# Patient Record
Sex: Female | Born: 1980 | Race: White | Hispanic: No | Marital: Married | Smoking: Never smoker
Health system: Southern US, Community
[De-identification: ages and names within clinical notes are randomized; demographics above are authoritative.]

## PROBLEM LIST (undated history)

## (undated) ENCOUNTER — Inpatient Hospital Stay (HOSPITAL_COMMUNITY): Payer: Self-pay

## (undated) DIAGNOSIS — F419 Anxiety disorder, unspecified: Secondary | ICD-10-CM

## (undated) DIAGNOSIS — R112 Nausea with vomiting, unspecified: Secondary | ICD-10-CM

## (undated) DIAGNOSIS — Z789 Other specified health status: Secondary | ICD-10-CM

## (undated) DIAGNOSIS — Z8632 Personal history of gestational diabetes: Secondary | ICD-10-CM

## (undated) DIAGNOSIS — Z8639 Personal history of other endocrine, nutritional and metabolic disease: Secondary | ICD-10-CM

## (undated) DIAGNOSIS — N200 Calculus of kidney: Secondary | ICD-10-CM

## (undated) DIAGNOSIS — T8859XA Other complications of anesthesia, initial encounter: Secondary | ICD-10-CM

## (undated) DIAGNOSIS — O24419 Gestational diabetes mellitus in pregnancy, unspecified control: Secondary | ICD-10-CM

## (undated) DIAGNOSIS — Z9889 Other specified postprocedural states: Secondary | ICD-10-CM

## (undated) DIAGNOSIS — C801 Malignant (primary) neoplasm, unspecified: Secondary | ICD-10-CM

## (undated) DIAGNOSIS — N189 Chronic kidney disease, unspecified: Secondary | ICD-10-CM

## (undated) DIAGNOSIS — Z87442 Personal history of urinary calculi: Secondary | ICD-10-CM

## (undated) DIAGNOSIS — T4145XA Adverse effect of unspecified anesthetic, initial encounter: Secondary | ICD-10-CM

## (undated) HISTORY — PX: NO PAST SURGERIES: SHX2092

## (undated) HISTORY — PX: WISDOM TOOTH EXTRACTION: SHX21

## (undated) HISTORY — PX: LITHOTRIPSY: SUR834

## (undated) MED FILL — Dexamethasone Sodium Phosphate Inj 100 MG/10ML: INTRAMUSCULAR | Qty: 1 | Status: AC

## (undated) MED FILL — Fosaprepitant Dimeglumine For IV Infusion 150 MG (Base Eq): INTRAVENOUS | Qty: 5 | Status: AC

---
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
## 2001-08-16 ENCOUNTER — Other Ambulatory Visit: Admission: RE | Admit: 2001-08-16 | Discharge: 2001-08-16 | Payer: Self-pay | Admitting: Gynecology

## 2002-08-23 ENCOUNTER — Other Ambulatory Visit: Admission: RE | Admit: 2002-08-23 | Discharge: 2002-08-23 | Payer: Self-pay | Admitting: Gynecology

## 2003-08-27 ENCOUNTER — Other Ambulatory Visit: Admission: RE | Admit: 2003-08-27 | Discharge: 2003-08-27 | Payer: Self-pay | Admitting: Gynecology

## 2004-06-30 ENCOUNTER — Other Ambulatory Visit: Admission: RE | Admit: 2004-06-30 | Discharge: 2004-06-30 | Payer: Self-pay | Admitting: Gynecology

## 2005-07-01 ENCOUNTER — Other Ambulatory Visit: Admission: RE | Admit: 2005-07-01 | Discharge: 2005-07-01 | Payer: Self-pay | Admitting: Gynecology

## 2008-10-25 ENCOUNTER — Encounter (INDEPENDENT_AMBULATORY_CARE_PROVIDER_SITE_OTHER): Payer: Self-pay | Admitting: Obstetrics and Gynecology

## 2008-10-25 ENCOUNTER — Ambulatory Visit: Admission: RE | Admit: 2008-10-25 | Discharge: 2008-10-25 | Payer: Self-pay | Admitting: Obstetrics and Gynecology

## 2008-10-25 ENCOUNTER — Ambulatory Visit: Payer: Self-pay | Admitting: Surgery

## 2009-06-19 ENCOUNTER — Emergency Department (HOSPITAL_COMMUNITY): Admission: EM | Admit: 2009-06-19 | Discharge: 2009-06-19 | Payer: Self-pay | Admitting: Emergency Medicine

## 2010-04-29 ENCOUNTER — Ambulatory Visit: Admission: RE | Admit: 2010-04-29 | Discharge: 2010-04-29 | Payer: Self-pay | Admitting: Obstetrics and Gynecology

## 2010-05-01 DIAGNOSIS — R0989 Other specified symptoms and signs involving the circulatory and respiratory systems: Secondary | ICD-10-CM | POA: Insufficient documentation

## 2010-05-21 ENCOUNTER — Ambulatory Visit: Payer: Self-pay | Admitting: Cardiology

## 2010-06-16 ENCOUNTER — Encounter (INDEPENDENT_AMBULATORY_CARE_PROVIDER_SITE_OTHER): Payer: Self-pay | Admitting: *Deleted

## 2010-09-09 ENCOUNTER — Ambulatory Visit: Payer: Self-pay | Admitting: Obstetrics and Gynecology

## 2010-09-09 ENCOUNTER — Inpatient Hospital Stay (HOSPITAL_COMMUNITY): Admission: AD | Admit: 2010-09-09 | Discharge: 2010-09-09 | Payer: Self-pay | Admitting: Obstetrics and Gynecology

## 2010-09-18 ENCOUNTER — Inpatient Hospital Stay (HOSPITAL_COMMUNITY): Admission: RE | Admit: 2010-09-18 | Discharge: 2010-09-21 | Payer: Self-pay | Admitting: Obstetrics and Gynecology

## 2011-01-26 NOTE — Assessment & Plan Note (Signed)
Summary: np6/17 weeks prgnant, tachycardia/jml   Visit Type:  new pt visit Referring Provider:  Dr. Henderson Cloud Primary Provider:  Laurann Montana  CC:  pt referred for tachycardia....sob....edema/hands....pt is [redacted] weeks gestation....denies any cp.  History of Present Illness: Sydney Rios is a 30 year old married white female, who is in her second trimester pregnancy, is referred by Dr.Tomblin for intermittent tachycardia or fast heartbeat.  She is enjoyed excellent cardiovascular health all her life. There is no history of any heart murmur or cardiac abnormality.  Prior to pregnancy and having these symptoms, she exercise on a regular basis. When she started having these episodes of tachycardia, she stopped.  She has a digital blood pressure cuff and has read her heart rate 140 on several occasions. 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 Has not occurred at rest. She has not fainted or become presyncopal. She denies orthostatic symptoms.  The pattern is a gradual onset and it doesn't suddenly stopped. He feels regular not irregular.  She admits to not drinking enough fluids. She did lemonade coffee which she drank fairly heavily prior to pregnancy.  This is her first pregnancy.  She does not use any alcohol, tobacco, or drugs. She is on only a prenatal vitamin.  Blood work May 4 showed normal electrolytes, normal CBC except for a mild anemia with a hemoglobin of 11.8.  Preventive Screening-Counseling & Management  Alcohol-Tobacco     Smoking Status: never  Caffeine-Diet-Exercise     Does Patient Exercise: no      Drug Use:  no.    Current Medications (verified): 1)  Prenatal Vitamins 0.8 Mg Tabs (Prenatal Multivit-Min-Fe-Fa) .Marland Kitchen.. 1 Tab Once Daily  Allergies (verified): No Known Drug Allergies  Past History:  Past Surgical History: wisdom teeth removed at 30 yrs old.. No other surgeries to report as per pt  Family  History: Both Grandfathers have had MI's Family History of Hypertension:  Grandmother has hyperlipidemia Family History of Diabetes: Mother type II  Social History: Full Time Married  Tobacco Use - No.  Alcohol Use - no Regular Exercise - no Drug Use - no Smoking Status:  never Does Patient Exercise:  no Drug Use:  no  Review of Systems       negative history of present illness  Vital Signs:  Patient profile:   30 year old female Height:      67 inches Weight:      166 pounds BMI:     26.09 Pulse rate:   87 / minute Pulse rhythm:   regular BP sitting:   110 / 62  (left arm) Cuff size:   large  Vitals Entered By: Sydney Rios, CMA (May 21, 2010 4:19 PM)  Physical Exam  General:  Well developed, well nourished, in no acute distress. Head:  normocephalic and atraumatic Eyes:  PERRLA/EOM intact; conjunctiva and lids normal. Neck:  thyroid is palpable, not enlarged or asymmetric, nontender Chest Deniz Eskridge:  no deformities or breast masses noted Lungs:  Clear bilaterally to auscultation and percussion. Heart:  Non-displaced PMI, chest non-tender; regular rate and rhythm, S1, S2 without murmurs, rubs or gallops. Carotid upstroke normal, no bruit. Normal abdominal aortic size, no bruits. Femorals normal pulses, no bruits. Pedals normal pulses. No edema, no varicosities. Abdomen:  pregnant, good bowelsounds Msk:  Back normal, normal gait. Muscle strength and tone normal. Pulses:  pulses normal in all 4 extremities Extremities:  No clubbing or cyanosis. Neurologic:  Alert and  oriented x 3. Skin:  Intact without lesions or rashes. Psych:  Normal affect.   EKG  Procedure date:  05/21/2010  Findings:      normal sinus rhythm, normal EKG except for RSR prime in V1 and V2 QRS 90 ms  Impression & Recommendations:  Problem # 1:  TACHYCARDIA (ICD-785) Assessment New Her symptoms do not sound like a pathologic arrhythmia. She has a fairly benign history, is extremely healthy  otherwise, all exam, and this is a normal EKG except for RSR prime in V1 and 2 consistent with an incomplete right bundle branch block. I will check a TSH and echo to rule out structural heart disease. I've asked her stay well hydrated and to exercise on regular basis which may improve her vagal time and decrease her chance of varying heart rates. Orders: EKG w/ Interpretation (93000) Echocardiogram (Echo)  Patient Instructions: 1)  Your physician recommends that you schedule a follow-up appointment in: AS NEEDED 2)  Your physician recommends that you continue on your current medications as directed. Please refer to the Current Medication list given to you today. 3)  Your physician recommends that you return for lab work in:TSH 785.1 DAY OF ECHO 4)  Your physician has requested that you have an echocardiogram.  Echocardiography is a painless test that uses sound waves to create images of your heart. It provides your doctor with information about the size and shape of your heart and how well your heart's chambers and valves are working.  This procedure takes approximately one hour. There are no restrictions for this procedure. 5)  INCREASE FLIUD INTAKE

## 2011-01-26 NOTE — Letter (Signed)
Summary: Appointment - Missed  [facility], Main Office  [address]   [place], [place] [address]   Phone: [phone]  Fax: [phone]     June 16, 2010 MRN: [id number]   JESIAH YERBY [address] [place], [place]  [address]   Dear Ms. Rollen Sox,  Our records indicate you missed your appointment on   06/15/2010 for Labs also Echo .  It is very important that we reach you to reschedule this appointment. We look forward to participating in your health care needs. Please contact us at the number listed above at your earliest convenience to reschedule this appointment.     Sincerely,    Glass blower/designer

## 2011-03-11 LAB — CBC
HCT: 26.7 % — ABNORMAL LOW (ref 36.0–46.0)
HCT: 33.8 % — ABNORMAL LOW (ref 36.0–46.0)
Hemoglobin: 11.4 g/dL — ABNORMAL LOW (ref 12.0–15.0)
Hemoglobin: 9.2 g/dL — ABNORMAL LOW (ref 12.0–15.0)
MCH: 30.4 pg (ref 26.0–34.0)
MCH: 31.2 pg (ref 26.0–34.0)
MCHC: 33.8 g/dL (ref 30.0–36.0)
MCHC: 34.5 g/dL (ref 30.0–36.0)
MCV: 90 fL (ref 78.0–100.0)
MCV: 90.4 fL (ref 78.0–100.0)
Platelets: 135 10*3/uL — ABNORMAL LOW (ref 150–400)
Platelets: 164 10*3/uL (ref 150–400)
RBC: 2.95 MIL/uL — ABNORMAL LOW (ref 3.87–5.11)
RBC: 3.75 MIL/uL — ABNORMAL LOW (ref 3.87–5.11)
RDW: 14.1 % (ref 11.5–15.5)
RDW: 14.4 % (ref 11.5–15.5)
WBC: 6.7 10*3/uL (ref 4.0–10.5)
WBC: 9.9 10*3/uL (ref 4.0–10.5)

## 2011-03-11 LAB — SURGICAL PCR SCREEN
MRSA, PCR: NEGATIVE
Staphylococcus aureus: NEGATIVE

## 2011-03-11 LAB — RPR: RPR Ser Ql: NONREACTIVE

## 2011-03-16 LAB — CBC
HCT: 34 % — ABNORMAL LOW (ref 36.0–46.0)
Hemoglobin: 11.8 g/dL — ABNORMAL LOW (ref 12.0–15.0)
MCV: 91.8 fL (ref 78.0–100.0)
RBC: 3.7 MIL/uL — ABNORMAL LOW (ref 3.87–5.11)
WBC: 7.3 10*3/uL (ref 4.0–10.5)

## 2011-03-16 LAB — COMPREHENSIVE METABOLIC PANEL
AST: 32 U/L (ref 0–37)
BUN: 9 mg/dL (ref 6–23)
CO2: 26 mEq/L (ref 19–32)
Chloride: 104 mEq/L (ref 96–112)
Creatinine, Ser: 0.46 mg/dL (ref 0.4–1.2)
GFR calc Af Amer: >60 mL/min (ref >60)
GFR calc non Af Amer: >60 mL/min (ref >60)
Glucose, Bld: 86 mg/dL (ref 70–99)
Total Bilirubin: 0.1 mg/dL — ABNORMAL LOW (ref 0.3–1.2)

## 2013-01-23 ENCOUNTER — Encounter (HOSPITAL_COMMUNITY)
Admission: AD | Disposition: A | Discharge status: Home / Self Care | Payer: Self-pay | Source: Ambulatory Visit | Attending: Obstetrics and Gynecology

## 2013-01-23 ENCOUNTER — Inpatient Hospital Stay (HOSPITAL_COMMUNITY): Payer: BC Managed Care – PPO | Admitting: Anesthesiology

## 2013-01-23 ENCOUNTER — Inpatient Hospital Stay (HOSPITAL_COMMUNITY)
Admission: AD | Admit: 2013-01-23 | Discharge: 2013-01-25 | DRG: 371 | Disposition: A | Discharge status: Home / Self Care | Payer: BC Managed Care – PPO | Source: Ambulatory Visit | Attending: Obstetrics and Gynecology | Admitting: Obstetrics and Gynecology

## 2013-01-23 ENCOUNTER — Encounter (HOSPITAL_COMMUNITY): Payer: Self-pay | Admitting: *Deleted

## 2013-01-23 ENCOUNTER — Encounter (HOSPITAL_COMMUNITY): Payer: Self-pay | Admitting: Anesthesiology

## 2013-01-23 DIAGNOSIS — O34219 Maternal care for unspecified type scar from previous cesarean delivery: Principal | ICD-10-CM | POA: Diagnosis present

## 2013-01-23 HISTORY — DX: Other specified postprocedural states: Z98.890

## 2013-01-23 HISTORY — DX: Other specified health status: Z78.9

## 2013-01-23 HISTORY — DX: Nausea with vomiting, unspecified: R11.2

## 2013-01-23 HISTORY — DX: Other specified postprocedural states: R11.2

## 2013-01-23 LAB — RPR: RPR Ser Ql: NONREACTIVE

## 2013-01-23 LAB — CBC
MCH: 26.7 pg (ref 26.0–34.0)
MCH: 27 pg (ref 26.0–34.0)
MCHC: 32.4 g/dL (ref 30.0–36.0)
Platelets: 212 10*3/uL (ref 150–400)
RBC: 4.23 MIL/uL (ref 3.87–5.11)
RDW: 14.1 % (ref 11.5–15.5)

## 2013-01-23 LAB — TYPE AND SCREEN

## 2013-01-23 LAB — POCT FERN TEST: POCT Fern Test: POSITIVE

## 2013-01-23 SURGERY — Surgical Case
Anesthesia: Spinal | Wound class: Clean Contaminated

## 2013-01-23 MED ORDER — PROMETHAZINE HCL 25 MG/ML IJ SOLN
INTRAMUSCULAR | Status: AC
  Filled 2013-01-23: qty 1

## 2013-01-23 MED ORDER — OXYTOCIN 10 UNIT/ML IJ SOLN
40.0000 [IU] | INTRAVENOUS | Status: DC | PRN
  Administered 2013-01-23: 40 [IU] via INTRAVENOUS

## 2013-01-23 MED ORDER — ZOLPIDEM TARTRATE 5 MG PO TABS: 5.0000 mg | ORAL_TABLET | Freq: Every evening | ORAL | Status: DC | PRN

## 2013-01-23 MED ORDER — NALBUPHINE HCL 10 MG/ML IJ SOLN
5.0000 mg | INTRAMUSCULAR | Status: DC | PRN
Start: 2013-01-23 — End: 2013-01-25
  Filled 2013-01-23: qty 1

## 2013-01-23 MED ORDER — KETOROLAC TROMETHAMINE 30 MG/ML IJ SOLN: 30.0000 mg | Freq: Four times a day (QID) | INTRAMUSCULAR | Status: AC | PRN

## 2013-01-23 MED ORDER — ONDANSETRON HCL 4 MG PO TABS: 4.0000 mg | ORAL_TABLET | ORAL | Status: DC | PRN

## 2013-01-23 MED ORDER — OXYCODONE-ACETAMINOPHEN 5-325 MG PO TABS
1.0000 | ORAL_TABLET | ORAL | Status: DC | PRN
  Administered 2013-01-23 – 2013-01-25 (×9): 1 via ORAL
  Filled 2013-01-23 (×9): qty 1

## 2013-01-23 MED ORDER — DEXTROSE 5 % IV SOLN
2.0000 g | Freq: Once | INTRAVENOUS | Status: AC
  Administered 2013-01-23: 2 g via INTRAVENOUS
  Filled 2013-01-23: qty 2

## 2013-01-23 MED ORDER — LACTATED RINGERS IV SOLN
INTRAVENOUS | Status: DC | PRN
  Administered 2013-01-23 (×4): via INTRAVENOUS

## 2013-01-23 MED ORDER — DIPHENHYDRAMINE HCL 50 MG/ML IJ SOLN: 12.5000 mg | INTRAMUSCULAR | Status: DC | PRN

## 2013-01-23 MED ORDER — OXYTOCIN 10 UNIT/ML IJ SOLN
INTRAMUSCULAR | Status: AC
  Filled 2013-01-23: qty 4

## 2013-01-23 MED ORDER — SIMETHICONE 80 MG PO CHEW
80.0000 mg | CHEWABLE_TABLET | Freq: Three times a day (TID) | ORAL | Status: DC
  Administered 2013-01-23 – 2013-01-25 (×6): 80 mg via ORAL

## 2013-01-23 MED ORDER — NALOXONE HCL 0.4 MG/ML IJ SOLN: 0.4000 mg | INTRAMUSCULAR | Status: DC | PRN

## 2013-01-23 MED ORDER — SIMETHICONE 80 MG PO CHEW: 80.0000 mg | CHEWABLE_TABLET | ORAL | Status: DC | PRN

## 2013-01-23 MED ORDER — LACTATED RINGERS IV SOLN
INTRAVENOUS | Status: DC
  Administered 2013-01-23: 125 mL/h via INTRAVENOUS

## 2013-01-23 MED ORDER — LACTATED RINGERS IV SOLN
INTRAVENOUS | Status: DC | PRN
  Administered 2013-01-23: 07:00:00 via INTRAVENOUS

## 2013-01-23 MED ORDER — METOCLOPRAMIDE HCL 5 MG/ML IJ SOLN
10.0000 mg | Freq: Once | INTRAMUSCULAR | Status: AC
  Administered 2013-01-23: 10 mg via INTRAVENOUS

## 2013-01-23 MED ORDER — SCOPOLAMINE 1 MG/3DAYS TD PT72
MEDICATED_PATCH | TRANSDERMAL | Status: AC
  Administered 2013-01-23: 1 via TRANSDERMAL
  Filled 2013-01-23: qty 1

## 2013-01-23 MED ORDER — TETANUS-DIPHTH-ACELL PERTUSSIS 5-2.5-18.5 LF-MCG/0.5 IM SUSP
0.5000 mL | Freq: Once | INTRAMUSCULAR | Status: DC
Start: 2013-01-24 — End: 2013-01-25

## 2013-01-23 MED ORDER — PRENATAL MULTIVITAMIN CH: 1.0000 | ORAL_TABLET | Freq: Every day | ORAL | Status: DC

## 2013-01-23 MED ORDER — PHENYLEPHRINE 40 MCG/ML (10ML) SYRINGE FOR IV PUSH (FOR BLOOD PRESSURE SUPPORT)
PREFILLED_SYRINGE | INTRAVENOUS | Status: AC
  Filled 2013-01-23: qty 5

## 2013-01-23 MED ORDER — SCOPOLAMINE 1 MG/3DAYS TD PT72: 1.0000 | MEDICATED_PATCH | Freq: Once | TRANSDERMAL | Status: DC

## 2013-01-23 MED ORDER — FENTANYL CITRATE 0.05 MG/ML IJ SOLN
INTRAMUSCULAR | Status: AC
  Filled 2013-01-23: qty 2

## 2013-01-23 MED ORDER — MORPHINE SULFATE (PF) 0.5 MG/ML IJ SOLN
INTRAMUSCULAR | Status: DC | PRN
  Administered 2013-01-23: .1 mg via INTRATHECAL

## 2013-01-23 MED ORDER — LANOLIN HYDROUS EX OINT: 1.0000 "application " | TOPICAL_OINTMENT | CUTANEOUS | Status: DC | PRN

## 2013-01-23 MED ORDER — FAMOTIDINE IN NACL 20-0.9 MG/50ML-% IV SOLN
20.0000 mg | Freq: Once | INTRAVENOUS | Status: AC
  Administered 2013-01-23: 20 mg via INTRAVENOUS
  Filled 2013-01-23: qty 50

## 2013-01-23 MED ORDER — MENTHOL 3 MG MT LOZG: 1.0000 | LOZENGE | OROMUCOSAL | Status: DC | PRN

## 2013-01-23 MED ORDER — FENTANYL CITRATE 0.05 MG/ML IJ SOLN
INTRAMUSCULAR | Status: DC | PRN
  Administered 2013-01-23: 15 ug via INTRATHECAL

## 2013-01-23 MED ORDER — IBUPROFEN 600 MG PO TABS
600.0000 mg | ORAL_TABLET | Freq: Four times a day (QID) | ORAL | Status: DC
  Administered 2013-01-23 – 2013-01-25 (×8): 600 mg via ORAL
  Filled 2013-01-23 (×8): qty 1

## 2013-01-23 MED ORDER — MORPHINE SULFATE 0.5 MG/ML IJ SOLN
INTRAMUSCULAR | Status: AC
  Filled 2013-01-23: qty 10

## 2013-01-23 MED ORDER — BUPIVACAINE IN DEXTROSE 0.75-8.25 % IT SOLN
INTRATHECAL | Status: DC | PRN
  Administered 2013-01-23: 1.5 mL via INTRATHECAL

## 2013-01-23 MED ORDER — PROMETHAZINE HCL 25 MG/ML IJ SOLN: 6.2500 mg | INTRAMUSCULAR | Status: DC | PRN

## 2013-01-23 MED ORDER — PRENATAL MULTIVITAMIN CH
1.0000 | ORAL_TABLET | Freq: Every day | ORAL | Status: DC
  Administered 2013-01-24 – 2013-01-25 (×2): 1 via ORAL
  Filled 2013-01-23 (×3): qty 1

## 2013-01-23 MED ORDER — NALOXONE HCL 1 MG/ML IJ SOLN: 1.0000 ug/kg/h | INTRAVENOUS | Status: DC | PRN

## 2013-01-23 MED ORDER — CITRIC ACID-SODIUM CITRATE 334-500 MG/5ML PO SOLN
30.0000 mL | Freq: Once | ORAL | Status: AC
  Administered 2013-01-23: 30 mL via ORAL
  Filled 2013-01-23: qty 15

## 2013-01-23 MED ORDER — DIPHENHYDRAMINE HCL 50 MG/ML IJ SOLN: 25.0000 mg | INTRAMUSCULAR | Status: DC | PRN

## 2013-01-23 MED ORDER — METOCLOPRAMIDE HCL 5 MG/ML IJ SOLN
INTRAMUSCULAR | Status: AC
  Administered 2013-01-23: 10 mg via INTRAVENOUS
  Filled 2013-01-23: qty 2

## 2013-01-23 MED ORDER — KETOROLAC TROMETHAMINE 30 MG/ML IJ SOLN
INTRAMUSCULAR | Status: AC
  Administered 2013-01-23: 30 mg
  Filled 2013-01-23: qty 1

## 2013-01-23 MED ORDER — DIPHENHYDRAMINE HCL 25 MG PO CAPS
25.0000 mg | ORAL_CAPSULE | Freq: Four times a day (QID) | ORAL | Status: DC | PRN
  Administered 2013-01-23: 25 mg via ORAL
  Filled 2013-01-23: qty 1

## 2013-01-23 MED ORDER — ONDANSETRON HCL 4 MG/2ML IJ SOLN
4.0000 mg | Freq: Three times a day (TID) | INTRAMUSCULAR | Status: DC | PRN
  Filled 2013-01-23: qty 2

## 2013-01-23 MED ORDER — PHENYLEPHRINE HCL 10 MG/ML IJ SOLN
INTRAMUSCULAR | Status: DC | PRN
  Administered 2013-01-23: 80 ug via INTRAVENOUS
  Administered 2013-01-23: 40 ug via INTRAVENOUS

## 2013-01-23 MED ORDER — KETOROLAC TROMETHAMINE 60 MG/2ML IM SOLN: 60.0000 mg | Freq: Once | INTRAMUSCULAR | Status: DC | PRN

## 2013-01-23 MED ORDER — IBUPROFEN 600 MG PO TABS: 600.0000 mg | ORAL_TABLET | Freq: Four times a day (QID) | ORAL | Status: DC | PRN

## 2013-01-23 MED ORDER — LACTATED RINGERS IV SOLN
INTRAVENOUS | Status: DC
  Administered 2013-01-23: 16:00:00 via INTRAVENOUS

## 2013-01-23 MED ORDER — DIBUCAINE 1 % RE OINT: 1.0000 "application " | TOPICAL_OINTMENT | RECTAL | Status: DC | PRN

## 2013-01-23 MED ORDER — ONDANSETRON HCL 4 MG/2ML IJ SOLN
INTRAMUSCULAR | Status: AC
  Filled 2013-01-23: qty 2

## 2013-01-23 MED ORDER — FENTANYL CITRATE 0.05 MG/ML IJ SOLN: 25.0000 ug | INTRAMUSCULAR | Status: DC | PRN

## 2013-01-23 MED ORDER — OXYTOCIN 40 UNITS IN LACTATED RINGERS INFUSION - SIMPLE MED: 62.5000 mL/h | INTRAVENOUS | Status: AC

## 2013-01-23 MED ORDER — MEPERIDINE HCL 25 MG/ML IJ SOLN: 6.2500 mg | INTRAMUSCULAR | Status: DC | PRN

## 2013-01-23 MED ORDER — ONDANSETRON HCL 4 MG/2ML IJ SOLN
INTRAMUSCULAR | Status: DC | PRN
  Administered 2013-01-23: 4 mg via INTRAVENOUS

## 2013-01-23 MED ORDER — NALBUPHINE HCL 10 MG/ML IJ SOLN
5.0000 mg | INTRAMUSCULAR | Status: DC | PRN
  Filled 2013-01-23: qty 1

## 2013-01-23 MED ORDER — METOCLOPRAMIDE HCL 5 MG/ML IJ SOLN: 10.0000 mg | Freq: Three times a day (TID) | INTRAMUSCULAR | Status: DC | PRN

## 2013-01-23 MED ORDER — ONDANSETRON HCL 4 MG/2ML IJ SOLN
4.0000 mg | INTRAMUSCULAR | Status: DC | PRN
  Administered 2013-01-23: 4 mg via INTRAVENOUS

## 2013-01-23 MED ORDER — DEXTROSE 5 % IV SOLN
2.0000 g | INTRAVENOUS | Status: DC | PRN
  Administered 2013-01-23: 2 g via INTRAVENOUS

## 2013-01-23 MED ORDER — SENNOSIDES-DOCUSATE SODIUM 8.6-50 MG PO TABS
2.0000 | ORAL_TABLET | Freq: Every day | ORAL | Status: DC
  Administered 2013-01-23 – 2013-01-24 (×2): 2 via ORAL

## 2013-01-23 MED ORDER — DIPHENHYDRAMINE HCL 25 MG PO CAPS
25.0000 mg | ORAL_CAPSULE | ORAL | Status: DC | PRN
  Administered 2013-01-24: 25 mg via ORAL
  Filled 2013-01-23: qty 1

## 2013-01-23 MED ORDER — WITCH HAZEL-GLYCERIN EX PADS: 1.0000 "application " | MEDICATED_PAD | CUTANEOUS | Status: DC | PRN

## 2013-01-23 MED ORDER — SODIUM CHLORIDE 0.9 % IJ SOLN: 3.0000 mL | INTRAMUSCULAR | Status: DC | PRN

## 2013-01-23 SURGICAL SUPPLY — 33 items
CLOTH BEACON ORANGE TIMEOUT ST (SAFETY) ×2 IMPLANT
DRAPE LG THREE QUARTER DISP (DRAPES) ×2 IMPLANT
DRESSING TELFA 8X3 (GAUZE/BANDAGES/DRESSINGS) IMPLANT
DRSG OPSITE POSTOP 4X10 (GAUZE/BANDAGES/DRESSINGS) ×2 IMPLANT
DURAPREP 26ML APPLICATOR (WOUND CARE) ×2 IMPLANT
ELECT REM PT RETURN 9FT ADLT (ELECTROSURGICAL) ×2
ELECTRODE REM PT RTRN 9FT ADLT (ELECTROSURGICAL) ×1 IMPLANT
EXTRACTOR VACUUM M CUP 4 TUBE (SUCTIONS) IMPLANT
GAUZE SPONGE 4X4 12PLY STRL LF (GAUZE/BANDAGES/DRESSINGS) IMPLANT
GLOVE BIO SURGEON STRL SZ8 (GLOVE) IMPLANT
GLOVE BIOGEL PI IND STRL 7.0 (GLOVE) ×1 IMPLANT
GLOVE BIOGEL PI INDICATOR 7.0 (GLOVE) ×1
GLOVE SURG ORTHO 8.0 STRL STRW (GLOVE) ×2 IMPLANT
GOWN PREVENTION PLUS LG XLONG (DISPOSABLE) ×4 IMPLANT
KIT ABG SYR 3ML LUER SLIP (SYRINGE) ×2 IMPLANT
NEEDLE HYPO 25X5/8 SAFETYGLIDE (NEEDLE) ×2 IMPLANT
NS IRRIG 1000ML POUR BTL (IV SOLUTION) ×2 IMPLANT
PACK C SECTION WH (CUSTOM PROCEDURE TRAY) IMPLANT
PAD ABD 7.5X8 STRL (GAUZE/BANDAGES/DRESSINGS) IMPLANT
PAD OB MATERNITY 4.3X12.25 (PERSONAL CARE ITEMS) ×2 IMPLANT
RTRCTR C-SECT PINK 25CM LRG (MISCELLANEOUS) ×2 IMPLANT
SLEEVE SCD COMPRESS KNEE MED (MISCELLANEOUS) ×2 IMPLANT
STAPLER VISISTAT 35W (STAPLE) ×2 IMPLANT
SUT MNCRL 0 VIOLET CTX 36 (SUTURE) ×3 IMPLANT
SUT MONOCRYL 0 CTX 36 (SUTURE) ×3
SUT PDS AB 1 CT  36 (SUTURE)
SUT PDS AB 1 CT 36 (SUTURE) IMPLANT
SUT VIC AB 1 CTX 36 (SUTURE)
SUT VIC AB 1 CTX36XBRD ANBCTRL (SUTURE) IMPLANT
TOWEL OR 17X24 6PK STRL BLUE (TOWEL DISPOSABLE) ×6 IMPLANT
TRAY FOLEY CATH 14FR (SET/KITS/TRAYS/PACK) IMPLANT
TRAY FOLEY CATH 16FR SILVER (SET/KITS/TRAYS/PACK) ×2 IMPLANT
WATER STERILE IRR 1000ML POUR (IV SOLUTION) ×2 IMPLANT

## 2013-01-23 NOTE — H&P (Signed)
Sydney Rios is a 32 y.o. female presenting for labor and SROM clear fluid at 0315 this am.  Previous C/S desires repeat.  Pregnancy has been uncomplicated. History OB History    Grav Para Term Preterm Abortions TAB SAB Ect Mult Living   2 1 1       1      Past Medical History  Diagnosis Date  . PONV (postoperative nausea and vomiting)   . No pertinent past medical history    Past Surgical History  Procedure Date  . Cesarean section   . No past surgeries    Family History: family history is not on file. Social History:  reports that she has never smoked. She has never used smokeless tobacco. She reports that she does not drink alcohol or use illicit drugs.   Prenatal Transfer Tool  Maternal Diabetes: No Genetic Screening: Normal Maternal Ultrasounds/Referrals: Normal Fetal Ultrasounds or other Referrals:  None Maternal Substance Abuse:  No Significant Maternal Medications:   Significant Maternal Lab Results:  None Other Comments:  None  ROS  Dilation: Closed Effacement (%): 60 Station: Ballotable Exam by:: M.Topp,RN Blood pressure 121/84, pulse 99, temperature 98.1 F (36.7 C), temperature source Oral, resp. rate 20, height 5\' 7"  (1.702 m), weight 88.451 kg (195 lb), SpO2 100.00%. Exam Physical Exam  Prenatal labs: ABO, Rh:   Antibody:   Rubella:   RPR:    HBsAg:    HIV:    GBS:     Assessment/Plan: IUP at term in labor with SROM Prev C/S desires repeat  Plan repeat LSTCS Risks and benefits of C/S were discussed.  All questions were answered and informed consent was obtained.  Plan to proceed with low segment transverse Cesarean Section. This patient has been seen and examined.   All of her questions were answered.  Labs and vital signs reviewed.  Informed consent has been obtained.  The History and Physical is current.    Jersey Ravenscroft C 01/23/2013, 6:00 AM

## 2013-01-23 NOTE — Anesthesia Postprocedure Evaluation (Deleted)
Anesthesia Post Note  Patient: Sydney Rios  Procedure(s) Performed: Procedure(s) (LRB): CESAREAN SECTION (N/A)  Anesthesia type: General  Patient location: PACU  Post pain: Pain level controlled  Post assessment: Post-op Vital signs reviewed  Last Vitals:  Filed Vitals:   01/23/13 0815  BP: 123/69  Pulse: 65  Temp:   Resp: 12    Post vital signs: Reviewed  Level of consciousness: sedated  Complications: No apparent anesthesia complicationsfj

## 2013-01-23 NOTE — MAU Note (Signed)
FHT 145-150 x 3 min post spinal placement

## 2013-01-23 NOTE — Anesthesia Postprocedure Evaluation (Signed)
  Anesthesia Post-op Note  Patient: Sydney Rios  Procedure(s) Performed: Procedure(s) (LRB) with comments: CESAREAN SECTION (N/A)  Patient Location: PACU and Mother/Baby  Anesthesia Type:Spinal  Level of Consciousness: awake, alert  and oriented  Airway and Oxygen Therapy: Patient Spontanous Breathing  Post-op Pain: mild  Post-op Assessment: Patient's Cardiovascular Status Stable, Respiratory Function Stable, No signs of Nausea or vomiting, Adequate PO intake, Pain level controlled, No headache, No backache, No residual numbness and No residual motor weakness  Post-op Vital Signs: stable  Complications: No apparent anesthesia complications

## 2013-01-23 NOTE — Addendum Note (Signed)
Addendum  created 01/23/13 1913 by Elbert Ewings, CRNA   Modules edited:Charges VN, Notes Section

## 2013-01-23 NOTE — Anesthesia Preprocedure Evaluation (Signed)
Anesthesia Evaluation  Patient identified by MRN, date of birth, ID band Patient awake    Reviewed: Allergy & Precautions, H&P , NPO status , Patient's Chart, lab work & pertinent test results, reviewed documented beta blocker date and time   History of Anesthesia Complications (+) PONV  Airway Mallampati: II TM Distance: >3 FB Neck ROM: full    Dental  (+) Teeth Intact   Pulmonary neg pulmonary ROS,  breath sounds clear to auscultation        Cardiovascular negative cardio ROS  Rhythm:regular Rate:Normal     Neuro/Psych negative neurological ROS  negative psych ROS   GI/Hepatic negative GI ROS, Neg liver ROS,   Endo/Other  negative endocrine ROS  Renal/GU negative Renal ROS  negative genitourinary   Musculoskeletal   Abdominal   Peds  Hematology negative hematology ROS (+)   Anesthesia Other Findings Last ate at 7 pm, sips of water at 4 am.   Ordered reglan, pepcid and bicitra prior to OR  Reproductive/Obstetrics (+) Pregnancy (h/o C/S x1, SROM)                           Anesthesia Physical Anesthesia Plan  ASA: II and emergent  Anesthesia Plan: Spinal   Post-op Pain Management:    Induction:   Airway Management Planned:   Additional Equipment:   Intra-op Plan:   Post-operative Plan:   Informed Consent: I have reviewed the patients History and Physical, chart, labs and discussed the procedure including the risks, benefits and alternatives for the proposed anesthesia with the patient or authorized representative who has indicated his/her understanding and acceptance.     Plan Discussed with: Surgeon and CRNA  Anesthesia Plan Comments:         Anesthesia Quick Evaluation

## 2013-01-23 NOTE — Anesthesia Postprocedure Evaluation (Deleted)
Anesthesia Post Note  Patient: Sydney Rios  Procedure(s) Performed: Procedure(s) (LRB): CESAREAN SECTION (N/A)  Anesthesia type: Spinal  Patient location: PACU  Post pain: Pain level controlled  Post assessment: Post-op Vital signs reviewed  Last Vitals:  Filed Vitals:   01/23/13 0815  BP: 123/69  Pulse: 65  Temp:   Resp: 12    Post vital signs: Reviewed  Level of consciousness: awake  Complications: No apparent anesthesia complications

## 2013-01-23 NOTE — MAU Note (Signed)
Pt states her water broke-note clear fluid running down her legs

## 2013-01-23 NOTE — Anesthesia Postprocedure Evaluation (Signed)
Anesthesia Post Note  Patient: Sydney Rios  Procedure(s) Performed: Procedure(s) (LRB): CESAREAN SECTION (N/A)  Anesthesia type: Spinal  Patient location: PACU  Post pain: Pain level controlled  Post assessment: Post-op Vital signs reviewed  Last Vitals:  Filed Vitals:   01/23/13 0830  BP: 101/60  Pulse: 71  Temp: 36.9 C  Resp: 12    Post vital signs: Reviewed  Level of consciousness: awake  Complications: No apparent anesthesia complications

## 2013-01-23 NOTE — Anesthesia Procedure Notes (Signed)
Spinal  Patient location during procedure: OR Start time: 01/23/2013 6:42 AM Staffing Performed by: anesthesiologist  Preanesthetic Checklist Completed: patient identified, site marked, surgical consent, pre-op evaluation, timeout performed, IV checked, risks and benefits discussed and monitors and equipment checked Spinal Block Patient position: sitting Prep: site prepped and draped and DuraPrep Patient monitoring: continuous pulse ox, blood pressure and heart rate Approach: midline Location: L3-4 Injection technique: single-shot Needle Needle type: Sprotte and Other  Needle gauge: 24 G Needle length: 9 cm Assessment Sensory level: T4 Additional Notes Clear free flow CSF on first attempt.  No paresthesia.  Patient tolerated procedure well.  Jasmine December, MD

## 2013-01-23 NOTE — Transfer of Care (Signed)
Immediate Anesthesia Transfer of Care Note  Patient: Sydney Rios  Procedure(s) Performed: Procedure(s) (LRB) with comments: CESAREAN SECTION (N/A)  Patient Location: PACU  Anesthesia Type:Spinal  Level of Consciousness: awake, alert  and oriented  Airway & Oxygen Therapy: Patient Spontanous Breathing  Post-op Assessment: Report given to PACU RN and Post -op Vital signs reviewed and stable  Post vital signs: Reviewed and stable  Complications: No apparent anesthesia complications

## 2013-01-23 NOTE — Op Note (Signed)
Cesarean Section Procedure Note  Pre-operative Diagnosis: IUP at 37 weeks, Prev C/S for repeat, Labor and SROM  Post-operative Diagnosis: same  Surgeon: Turner Daniels   Assistants: none  Anesthesia:spinal  Procedure:  Low Segment Transverse cesarean section  Procedure Details  The patient was seen in the Holding Room. The risks, benefits, complications, treatment options, and expected outcomes were discussed with the patient.  The patient concurred with the proposed plan, giving informed consent.  The site of surgery properly noted/marked.. A Time Out was held and the above information confirmed.  After induction of anesthesia, the patient was draped and prepped in the usual sterile manner. A Pfannenstiel incision was made and carried down through the subcutaneous tissue to the fascia. Fascial incision was made and extended transversely. The fascia was separated from the underlying rectus tissue superiorly and inferiorly. The peritoneum was identified and entered. Peritoneal incision was extended longitudinally. The utero-vesical peritoneal reflection was incised transversely and the bladder flap was bluntly freed from the lower uterine segment. A low transverse uterine incision was made. Delivered from vertex presentation was a baby with Apgar scores of 8 at one minute and 8 at five minutes. After the umbilical cord was clamped and cut cord blood was obtained for evaluation. The placenta was removed intact and appeared normal. The uterine outline, tubes and ovaries appeared normal. The uterine incision was closed with running locked sutures of 0 monocryl and imbricated with 0 monocryl. Hemostasis was observed. Lavage was carried out until clear. The peritoneum was then closed with 0 monocryl and rectus muscles plicated in the midline.  After hemostasis was assured, the fascia was then reapproximated with running sutures of 0PDS. Irrigation was applied and after adequate hemostasis was assured, the  skin was reapproximated with staples.  Instrument, sponge, and needle counts were correct prior the abdominal closure and at the conclusion of the case. The patient received 2 grams cefotetan preoperatively.  Findings: Viable female, ph art sent  Estimated Blood Loss:  500cc         Specimens: Placenta was sent to L&D         Complications:  None

## 2013-01-24 ENCOUNTER — Encounter (HOSPITAL_COMMUNITY): Payer: Self-pay | Admitting: Obstetrics and Gynecology

## 2013-01-24 LAB — CBC
MCH: 26.9 pg (ref 26.0–34.0)
Platelets: 147 10*3/uL — ABNORMAL LOW (ref 150–400)
RBC: 3.53 MIL/uL — ABNORMAL LOW (ref 3.87–5.11)
RDW: 14.2 % (ref 11.5–15.5)
WBC: 8.1 10*3/uL (ref 4.0–10.5)

## 2013-01-24 NOTE — Progress Notes (Signed)
Subjective: Postpartum Day one: Cesarean Delivery Patient reports nausea and no problems voiding.    Objective: Vital signs in last 24 hours: Temp:  [97.4 F (36.3 C)-98.8 F (37.1 C)] 97.9 F (36.6 C) (01/29 0600) Pulse Rate:  [51-82] 67  (01/29 0600) Resp:  [12-18] 16  (01/29 0600) BP: (112-143)/(65-87) 112/65 mmHg (01/29 0600) SpO2:  [96 %-99 %] 99 % (01/29 0600)  Physical Exam:  General: alert Lochia: appropriate Uterine Fundus: firm Incision: healing well DVT Evaluation: No evidence of DVT seen on physical exam.   Basename 01/24/13 0555 01/23/13 1235  HGB 9.5* 10.4*  HCT 29.4* 32.1*    Assessment/Plan: Status post Cesarean section. Doing well postoperatively.  Continue current care.  Kanitra Purifoy S 01/24/2013, 8:30 AM

## 2013-01-25 MED ORDER — IBUPROFEN 600 MG PO TABS: 600.0000 mg | ORAL_TABLET | Freq: Four times a day (QID) | ORAL | Status: DC

## 2013-01-25 MED ORDER — OXYCODONE-ACETAMINOPHEN 5-325 MG PO TABS: 1.0000 | ORAL_TABLET | ORAL | Status: DC | PRN

## 2013-01-25 NOTE — Progress Notes (Signed)
Subjective: Postpartum Day 2: Cesarean Delivery Patient reports tolerating PO, + flatus and no problems voiding.    Objective: Vital signs in last 24 hours: Temp:  [97.5 F (36.4 C)-97.9 F (36.6 C)] 97.8 F (36.6 C) (01/30 0340) Pulse Rate:  [60-81] 60  (01/30 0340) Resp:  [18] 18  (01/30 0340) BP: (109-126)/(63-76) 115/76 mmHg (01/30 0340)  Physical Exam:  General: alert and cooperative Lochia: appropriate Uterine Fundus: firm Incision: honeycomb dressing intact with small old drainage noted  DVT Evaluation: No evidence of DVT seen on physical exam. No significant calf/ankle edema.   Basename 01/24/13 0555 01/23/13 1235  HGB 9.5* 10.4*  HCT 29.4* 32.1*    Assessment/Plan: Status post Cesarean section. Doing well postoperatively.  Discharge home with standard precautions and return to clinic in 2-3 days for staple removal.  Daymein Nunnery G 01/25/2013, 8:13 AM

## 2013-01-25 NOTE — Discharge Summary (Signed)
Obstetric Discharge Summary Reason for Admission: rupture of membranes Prenatal Procedures: ultrasound Intrapartum Procedures: cesarean: low cervical, transverse Postpartum Procedures: none Complications-Operative and Postpartum: none Hemoglobin  Date Value Range Status  01/24/2013 9.5* 12.0 - 15.0 g/dL Final     HCT  Date Value Range Status  01/24/2013 29.4* 36.0 - 46.0 % Final    Physical Exam:  General: alert and cooperative Lochia: appropriate Uterine Fundus: firm Incision: honeycomb dressing intact with small old drainage noted. Staples appear intact DVT Evaluation: No evidence of DVT seen on physical exam. No significant calf/ankle edema.  Discharge Diagnoses: Term Pregnancy-delivered  Discharge Information: Date: 01/25/2013 Activity: pelvic rest Diet: routine Medications: PNV, Ibuprofen and Percocet Condition: stable Instructions: refer to practice specific booklet Discharge to: home   Newborn Data: Live born female  Birth Weight: 10 lb (4536 g) APGAR: 8, 8  Home with mother.  Maliik Karner G 01/25/2013, 8:28 AM

## 2013-01-25 NOTE — Plan of Care (Signed)
Problem: Discharge Progression Outcomes Goal: Remove staples per MD order Outcome: Not Applicable Date Met:  01/25/13 Pt going back to MD for staple removal in a couple of days

## 2013-02-01 ENCOUNTER — Inpatient Hospital Stay (HOSPITAL_COMMUNITY): Admission: RE | Admit: 2013-02-01 | Payer: Self-pay | Source: Ambulatory Visit

## 2013-02-03 ENCOUNTER — Encounter (HOSPITAL_COMMUNITY): Admission: RE | Payer: Self-pay | Source: Ambulatory Visit

## 2013-02-03 ENCOUNTER — Inpatient Hospital Stay (HOSPITAL_COMMUNITY)
Admission: RE | Admit: 2013-02-03 | Payer: BC Managed Care – PPO | Source: Ambulatory Visit | Admitting: Obstetrics and Gynecology

## 2013-02-03 SURGERY — Surgical Case
Anesthesia: Regional

## 2013-04-19 ENCOUNTER — Ambulatory Visit (HOSPITAL_COMMUNITY): Payer: Self-pay

## 2013-04-26 ENCOUNTER — Ambulatory Visit (HOSPITAL_COMMUNITY)
Admission: RE | Admit: 2013-04-26 | Discharge: 2013-04-26 | Disposition: A | Discharge status: Home / Self Care | Payer: Self-pay | Source: Ambulatory Visit | Attending: Obstetrics and Gynecology | Admitting: Obstetrics and Gynecology

## 2013-04-26 NOTE — Lactation Note (Signed)
Infant Lactation Consultation Outpatient Visit Note  Sydney Rios is here today for a latch check.  She has had thrush and is still finishing her prescription.  The back of her tongue continues to be white.  Her mother reports that she has reflux and when she eats from a bottle it is a combination of breast milk and AR formula as recommended by her pediatrician.  Mom's milk supply is more than adequate.  LC observed a feeding.  Observations included audible snapback, pulling on and off the breast, inability to maintain the seal leading to [facility] leaking a large amount of milk, Mother reports that she is a "messy eater".  She ate on the right breast and was reattached a total of 3 times and transferred a total of 75 ml.. She then ate on the left and transferred 30 additional ml.  Her top lip tucks in with feeding and her labial frenum attaches to the gum close to the alveolar ridge.  A small cleft is noted in the tip of her tongue in certain positions.  This LC suspects that this anatomy could be contributing to her feeding problems.  Mother reports that symptoms have increased since she began taking feedings from a bottle.  Suggestions to mother included evaluation by [facility] on [facility] and/or having her oral anatomy evaluated by an oral surgeon.  She asked about pumping and bottle feeding as well.  Informed her that this was an option and that it was her decision.    Patient Name: Sydney Rios Date of Birth: 1981-09-09 01/23/13 Birth Weight:  10# Gestational Age at Delivery: Gestational Age: <None> Type of Delivery:   Breastfeeding History Frequency of Breastfeeding:  Length of Feeding:  Voids:  Stools:   Supplementing / Method: Pumping:  Type of Pump:   Frequency:  Volume:    Comments:    Consultation Evaluation:  Initial Feeding Assessment: Pre-feed Weight: Post-feed Weight: Amount Transferred: Comments:  Additional Feeding Assessment: Pre-feed  Weight: Post-feed Weight: Amount Transferred: Comments:  Additional Feeding Assessment: Pre-feed Weight: Post-feed Weight: Amount Transferred: Comments:  Total Breast milk Transferred this Visit:  Total Supplement Given:   Additional Interventions:   Follow-Up      Soyla Dryer 04/26/2013, 5:30 PM

## 2013-04-27 ENCOUNTER — Ambulatory Visit (HOSPITAL_COMMUNITY): Payer: Self-pay

## 2014-03-09 LAB — OB RESULTS CONSOLE ABO/RH: RH Type: POSITIVE

## 2014-03-09 LAB — OB RESULTS CONSOLE GC/CHLAMYDIA
Chlamydia: NEGATIVE
Gonorrhea: NEGATIVE

## 2014-03-09 LAB — OB RESULTS CONSOLE RUBELLA ANTIBODY, IGM: Rubella: IMMUNE

## 2014-03-09 LAB — OB RESULTS CONSOLE HIV ANTIBODY (ROUTINE TESTING): HIV: NONREACTIVE

## 2014-03-09 LAB — OB RESULTS CONSOLE HEPATITIS B SURFACE ANTIGEN: HEP B S AG: NEGATIVE

## 2014-03-09 LAB — OB RESULTS CONSOLE RPR: RPR: NONREACTIVE

## 2014-07-06 ENCOUNTER — Inpatient Hospital Stay (HOSPITAL_COMMUNITY)
Admission: AD | Admit: 2014-07-06 | Discharge: 2014-07-06 | Disposition: A | Discharge status: Home / Self Care | Payer: 59 | Source: Ambulatory Visit | Attending: Obstetrics and Gynecology | Admitting: Obstetrics and Gynecology

## 2014-07-06 ENCOUNTER — Encounter (HOSPITAL_COMMUNITY): Payer: Self-pay

## 2014-07-06 DIAGNOSIS — O99891 Other specified diseases and conditions complicating pregnancy: Secondary | ICD-10-CM | POA: Diagnosis not present

## 2014-07-06 DIAGNOSIS — R1013 Epigastric pain: Secondary | ICD-10-CM

## 2014-07-06 DIAGNOSIS — R109 Unspecified abdominal pain: Secondary | ICD-10-CM | POA: Diagnosis present

## 2014-07-06 DIAGNOSIS — R42 Dizziness and giddiness: Secondary | ICD-10-CM | POA: Insufficient documentation

## 2014-07-06 DIAGNOSIS — O9989 Other specified diseases and conditions complicating pregnancy, childbirth and the puerperium: Principal | ICD-10-CM

## 2014-07-06 LAB — URINALYSIS, ROUTINE W REFLEX MICROSCOPIC
Bilirubin Urine: NEGATIVE
Glucose, UA: NEGATIVE mg/dL
Ketones, ur: NEGATIVE mg/dL
LEUKOCYTES UA: NEGATIVE
Nitrite: NEGATIVE
PROTEIN: NEGATIVE mg/dL
SPECIFIC GRAVITY, URINE: 1.015 (ref 1.005–1.030)
UROBILINOGEN UA: 0.2 mg/dL (ref 0.0–1.0)
pH: 6 (ref 5.0–8.0)

## 2014-07-06 LAB — URINE MICROSCOPIC-ADD ON

## 2014-07-06 MED ORDER — GI COCKTAIL ~~LOC~~
30.0000 mL | Freq: Once | ORAL | Status: AC
  Administered 2014-07-06: 30 mL via ORAL
  Filled 2014-07-06: qty 30

## 2014-07-06 NOTE — MAU Note (Signed)
Pt c/o upper abdominal pain that started this morning and has progressively gotten worse. Pt states pain feels like gas. Pt has taken gas x and mylanta with no relief. States she had a BM with no relief. Does not feel constipated. Denies UC. +FM. Denies vag bleeding or discharge.

## 2014-07-06 NOTE — MAU Provider Note (Signed)
History     CSN: [id number]  Arrival date and time: 07/06/14 2334   First Provider Initiated Contact with Patient 07/06/14 1948      Chief Complaint  Patient presents with  . Abdominal Pain  . Dizziness   HPI Sydney Rios 33 y.o. 24w1  Comes to MAU with upper abdominal pain today that has not responded to Maalox or Gas X.  Client states pain like this usually responds to one of those but it has not today.  Also has had occasional dizziness but more frequently for the past week.  Denies any contractions.  Is feeling the baby move but feels like the baby has been quieter today.  Baby is moving while this client interview is happening.  OB History   Grav Para Term Preterm Abortions TAB SAB Ect Mult Living   3 2 2       2       Past Medical History  Diagnosis Date  . PONV (postoperative nausea and vomiting)   . No pertinent past medical history     Past Surgical History  Procedure Laterality Date  . Cesarean section    . No past surgeries    . Cesarean section  01/23/2013    Procedure: CESAREAN SECTION;  Surgeon: Luz Lex, MD;  Location: [facility];  Service: Obstetrics;  Laterality: N/A;    History reviewed. No pertinent family history.  History  Substance Use Topics  . Smoking status: Never Smoker   . Smokeless tobacco: Never Used  . Alcohol Use: No    Allergies: No Known Allergies  Prescriptions prior to admission  Medication Sig Dispense Refill  . alum & mag hydroxide-simeth (MAALOX/MYLANTA) 200-200-20 MG/5ML suspension Take by mouth every 6 (six) hours as needed for indigestion or heartburn.      . Prenatal Vit-Fe Fumarate-FA (PRENATAL MULTIVITAMIN) TABS Take 1 tablet by mouth daily.      . simethicone (MYLICON) 80 MG chewable tablet Chew 80 mg by mouth every 6 (six) hours as needed for flatulence.      . valACYclovir (VALTREX) 1000 MG tablet Take 1,000 mg by mouth daily. For fever blisters        Review of Systems  Constitutional: Negative for fever.   Gastrointestinal: Positive for abdominal pain. Negative for heartburn, nausea and vomiting.  Genitourinary:       No vaginal discharge. No vaginal bleeding. No dysuria.   Physical Exam   Blood pressure 125/63, pulse 87, temperature 98.3 F (36.8 C), temperature source Oral, resp. rate 18, height 5' 6.4" (1.687 m), weight 166 lb 6.4 oz (75.479 kg), unknown if currently breastfeeding.  Physical Exam  Nursing note and vitals reviewed. Constitutional: She is oriented to person, place, and time. She appears well-developed and well-nourished. No distress.  HENT:  Head: Normocephalic.  Eyes: EOM are normal.  Neck: Neck supple.  Respiratory: Effort normal.  GI: Soft. There is tenderness. There is no rebound and no guarding.  Has diffuse pain all across the top of her abdomen, above the level of the uterine fundus. On the fetal monitor.  FHT baseline 140.  Accels of 10x10 noted.  Reassuring monitor strip.  No contractions.  Musculoskeletal: Normal range of motion.  Neurological: She is alert and oriented to person, place, and time.  Skin: Skin is warm and dry.  Psychiatric: She has a normal mood and affect.    MAU Course  Procedures  MDM Given a GI cocktail and client is much improved but the pain  is not completely gone.  The dizziness is periodic when standing up and advised to move slowly and be aware during position changes in pregnancy.  Her blood pressure is normal here and she states is was lower at home today.  Next appointment is at the end of July in the office.  Reviewed the plan of care with Dr. Julien Girt at 2043  Assessment and Plan  Upper abdominal pain possibly from indigestion Dizziness  Plan: Will discharge Call the office if your symptoms persist and be scheduled for an appointment in the office. May try over the counter Pepcid or Zantac and take once a day by the directions on the box. Drink at least 8 8-oz glasses of water every day. Be aware of having regular  bowel movements as constipation and gas can increase the abdominal pain.  BURLESON,TERRI 07/06/2014, 8:24 PM

## 2014-07-06 NOTE — Discharge Instructions (Signed)
Call the office if your symptoms persist and be scheduled for an appointment in the office. May try over the counter Pepcid or Zantac and take once a day by the directions on the box. Drink at least 8 8-oz glasses of water every day. Be aware of having regular bowel movements as constipation and gas can increase the abdominal pain.

## 2014-07-08 LAB — URINE CULTURE: Colony Count: 40000

## 2014-09-19 ENCOUNTER — Inpatient Hospital Stay (HOSPITAL_COMMUNITY)
Admission: AD | Admit: 2014-09-19 | Discharge: 2014-09-19 | Disposition: A | Discharge status: Home / Self Care | Payer: 59 | Source: Ambulatory Visit | Attending: Obstetrics and Gynecology | Admitting: Obstetrics and Gynecology

## 2014-09-19 ENCOUNTER — Encounter (HOSPITAL_COMMUNITY): Payer: Self-pay | Admitting: *Deleted

## 2014-09-19 DIAGNOSIS — O368190 Decreased fetal movements, unspecified trimester, not applicable or unspecified: Secondary | ICD-10-CM | POA: Insufficient documentation

## 2014-09-19 DIAGNOSIS — O288 Other abnormal findings on antenatal screening of mother: Secondary | ICD-10-CM

## 2014-09-19 DIAGNOSIS — O34219 Maternal care for unspecified type scar from previous cesarean delivery: Secondary | ICD-10-CM | POA: Insufficient documentation

## 2014-09-19 DIAGNOSIS — O289 Unspecified abnormal findings on antenatal screening of mother: Secondary | ICD-10-CM

## 2014-09-19 NOTE — MAU Provider Note (Signed)
Chief Complaint:  Decreased Fetal Movement   First Provider Initiated Contact with Patient 09/19/14 1352     HPI: Sydney Rios is a 33 y.o. G3P2002 at [redacted]w[redacted]d who was sent to maternity admissions reporting from [facility] for prolonged monitoring. BP 6/10. Decreased FM since yesterday. Painless UC's (No change from baseline). Denies leakage of fluid or vaginal bleeding.   Pregnancy Course: Hc C/S x 2.   Past Medical History: Past Medical History  Diagnosis Date  . PONV (postoperative nausea and vomiting)   . No pertinent past medical history     Past obstetric history: OB History  Gravida Para Term Preterm AB SAB TAB Ectopic Multiple Living  3 2 2       2     # Outcome Date GA Lbr Len/2nd Weight Sex Delivery Anes PTL Lv  3 CUR           2 TRM 01/23/13 [redacted]w[redacted]d  4.536 kg (10 lb) F LTCS Spinal  Y  1 TRM     M LTCS  N Y      Past Surgical History: Past Surgical History  Procedure Laterality Date  . Cesarean section    . No past surgeries    . Cesarean section  01/23/2013    Procedure: CESAREAN SECTION;  Surgeon: Luz Lex, MD;  Location: [facility];  Service: Obstetrics;  Laterality: N/A;     Family History: History reviewed. No pertinent family history.  Social History: History  Substance Use Topics  . Smoking status: Never Smoker   . Smokeless tobacco: Never Used  . Alcohol Use: No    Allergies: No Known Allergies  Meds:  Prescriptions prior to admission  Medication Sig Dispense Refill  . acyclovir (ZOVIRAX) 400 MG tablet Take 400 mg by mouth as needed (fever blisters).      . Prenatal Vit-Fe Fumarate-FA (PRENATAL MULTIVITAMIN) TABS Take 1 tablet by mouth daily.        ROS: Pertinent findings in history of present illness.  Physical Exam  Blood pressure 132/79, pulse 101, temperature 98.4 F (36.9 C), temperature source Oral, resp. rate 18, height 5\' 5"  (1.651 m), weight 82.101 kg (181 lb), unknown if currently breastfeeding. GENERAL: Well-developed,  well-nourished female in no acute distress.  HEENT: normocephalic HEART: normal rate RESP: normal effort ABDOMEN: Soft, non-tender, gravid appropriate for gestational age EXTREMITIES: Nontender, no edema NEURO: alert and oriented SPECULUM EXAM: Deferred     FHT:  Baseline 145 , moderate variability, accelerations present, no decelerations Contractions: q 2-6 mins, painless    Labs: No results found for this or any previous visit (from the past 24 hour(s)).  Imaging:  No results found. MAU Course: FHR reactive x 1 hour.  Assessment: 1. Non-reactive NST (non-stress test) in office. Reactive in MAU    Plan: Discharge home in stable condition per consult w/ Dr. Gaetano Net. Preterm labor precautions and fetal kick counts   Follow-up Information   Follow up with TOMBLIN II,JAMES E, MD In 1 day. (for Nons stress Test)    Specialty:  Obstetrics and Gynecology   Contact information:   [place] [address] [phone]       Follow up with [facility]. (As needed in emergencies)    Contact information:   [address] [id number] [place] [address] [phone]        Medication List    ASK your doctor about these medications  acyclovir 400 MG tablet  Commonly known as:  ZOVIRAX  Take 400 mg by mouth as needed (fever blisters).     prenatal multivitamin Tabs tablet  Take 1 tablet by mouth daily.        [place], CNM 09/19/2014 1:50 PM

## 2014-09-19 NOTE — Discharge Instructions (Signed)

## 2014-09-19 NOTE — MAU Note (Signed)
Decreased movement since yesterday.  One in last 45 min.  No bleeding, leaking or pain. Sent from . Did bpp6/10

## 2014-10-09 ENCOUNTER — Encounter (HOSPITAL_COMMUNITY)
Admission: RE | Admit: 2014-10-09 | Discharge: 2014-10-09 | Disposition: A | Discharge status: Home / Self Care | Payer: 59 | Source: Ambulatory Visit | Attending: Obstetrics and Gynecology | Admitting: Obstetrics and Gynecology

## 2014-10-09 ENCOUNTER — Encounter (HOSPITAL_COMMUNITY): Payer: Self-pay

## 2014-10-09 DIAGNOSIS — O3421 Maternal care for scar from previous cesarean delivery: Secondary | ICD-10-CM | POA: Diagnosis not present

## 2014-10-09 HISTORY — DX: Chronic kidney disease, unspecified: N18.9

## 2014-10-09 LAB — TYPE AND SCREEN
ABO/RH(D): A POS
ANTIBODY SCREEN: NEGATIVE

## 2014-10-09 LAB — CBC
HEMATOCRIT: 32 % — AB (ref 36.0–46.0)
HEMOGLOBIN: 10.7 g/dL — AB (ref 12.0–15.0)
MCH: 28.1 pg (ref 26.0–34.0)
MCHC: 33.4 g/dL (ref 30.0–36.0)
MCV: 84 fL (ref 78.0–100.0)
PLATELETS: 200 10*3/uL (ref 150–400)
RBC: 3.81 MIL/uL — AB (ref 3.87–5.11)
RDW: 13.5 % (ref 11.5–15.5)
WBC: 7.5 10*3/uL (ref 4.0–10.5)

## 2014-10-09 LAB — RPR

## 2014-10-09 NOTE — Patient Instructions (Signed)
   Your procedure is scheduled on:  Enter through the Main Entrance of [facility] at: Pick up the phone at the desk and dial [phone] and inform us of your arrival.  Please call this number if you have any problems the morning of surgery: [phone]  Remember: Do not eat food after midnight: Do not drink clear liquids after: Take these medicines the morning of surgery with a SIP OF WATER:  Do not wear jewelry, make-up, or FINGER nail polish No metal in your hair or on your body. Do not wear lotions, powders, perfumes.  You may wear deodorant.  Do not bring valuables to the hospital. Contacts, dentures or bridgework may not be worn into surgery.  Leave suitcase in the car. After Surgery it may be brought to your room. For patients being admitted to the hospital, checkout time is 11:00am the day of discharge.    Patients discharged on the day of surgery will not be allowed to drive home.      

## 2014-10-11 ENCOUNTER — Encounter (HOSPITAL_COMMUNITY): Payer: 59 | Admitting: Certified Registered"

## 2014-10-11 ENCOUNTER — Encounter (HOSPITAL_COMMUNITY): Payer: Self-pay | Admitting: *Deleted

## 2014-10-11 ENCOUNTER — Encounter (HOSPITAL_COMMUNITY)
Admission: AD | Disposition: A | Discharge status: Home / Self Care | Payer: Self-pay | Source: Ambulatory Visit | Attending: Obstetrics and Gynecology

## 2014-10-11 ENCOUNTER — Inpatient Hospital Stay (HOSPITAL_COMMUNITY)
Admission: AD | Admit: 2014-10-11 | Discharge: 2014-10-14 | DRG: 765 | Disposition: A | Discharge status: Home / Self Care | Payer: 59 | Source: Ambulatory Visit | Attending: Obstetrics and Gynecology | Admitting: Obstetrics and Gynecology

## 2014-10-11 ENCOUNTER — Inpatient Hospital Stay (HOSPITAL_COMMUNITY): Payer: 59 | Admitting: Certified Registered"

## 2014-10-11 DIAGNOSIS — Z87442 Personal history of urinary calculi: Secondary | ICD-10-CM | POA: Diagnosis not present

## 2014-10-11 DIAGNOSIS — O26833 Pregnancy related renal disease, third trimester: Secondary | ICD-10-CM | POA: Diagnosis present

## 2014-10-11 DIAGNOSIS — Z3A38 38 weeks gestation of pregnancy: Secondary | ICD-10-CM | POA: Diagnosis present

## 2014-10-11 DIAGNOSIS — O3421 Maternal care for scar from previous cesarean delivery: Secondary | ICD-10-CM | POA: Diagnosis present

## 2014-10-11 DIAGNOSIS — Z98891 History of uterine scar from previous surgery: Secondary | ICD-10-CM

## 2014-10-11 DIAGNOSIS — N189 Chronic kidney disease, unspecified: Secondary | ICD-10-CM | POA: Diagnosis present

## 2014-10-11 LAB — CBC
HCT: 33.8 % — ABNORMAL LOW (ref 36.0–46.0)
Hemoglobin: 11.3 g/dL — ABNORMAL LOW (ref 12.0–15.0)
MCH: 28 pg (ref 26.0–34.0)
MCHC: 33.4 g/dL (ref 30.0–36.0)
MCV: 83.9 fL (ref 78.0–100.0)
Platelets: 214 10*3/uL (ref 150–400)
RBC: 4.03 MIL/uL (ref 3.87–5.11)
RDW: 13.7 % (ref 11.5–15.5)
WBC: 8 10*3/uL (ref 4.0–10.5)

## 2014-10-11 SURGERY — Surgical Case
Anesthesia: Regional

## 2014-10-11 SURGERY — Surgical Case
Anesthesia: Spinal

## 2014-10-11 MED ORDER — FENTANYL CITRATE 0.05 MG/ML IJ SOLN
INTRAMUSCULAR | Status: DC | PRN
  Administered 2014-10-11: 25 ug via INTRATHECAL

## 2014-10-11 MED ORDER — OXYTOCIN 10 UNIT/ML IJ SOLN
INTRAMUSCULAR | Status: AC
  Filled 2014-10-11: qty 4

## 2014-10-11 MED ORDER — SIMETHICONE 80 MG PO CHEW
80.0000 mg | CHEWABLE_TABLET | ORAL | Status: DC
  Administered 2014-10-12 (×2): 80 mg via ORAL
  Filled 2014-10-11 (×3): qty 1

## 2014-10-11 MED ORDER — BUPIVACAINE IN DEXTROSE 0.75-8.25 % IT SOLN
INTRATHECAL | Status: DC | PRN
  Administered 2014-10-11: 1.6 mL via INTRATHECAL

## 2014-10-11 MED ORDER — KETOROLAC TROMETHAMINE 30 MG/ML IJ SOLN: 30.0000 mg | Freq: Four times a day (QID) | INTRAMUSCULAR | Status: DC | PRN

## 2014-10-11 MED ORDER — KETOROLAC TROMETHAMINE 30 MG/ML IJ SOLN
INTRAMUSCULAR | Status: AC
  Filled 2014-10-11: qty 1

## 2014-10-11 MED ORDER — LACTATED RINGERS IV BOLUS (SEPSIS)
1000.0000 mL | Freq: Once | INTRAVENOUS | Status: AC
  Administered 2014-10-11: 1000 mL via INTRAVENOUS

## 2014-10-11 MED ORDER — DIPHENHYDRAMINE HCL 50 MG/ML IJ SOLN: 12.5000 mg | INTRAMUSCULAR | Status: DC | PRN

## 2014-10-11 MED ORDER — LACTATED RINGERS IV SOLN
INTRAVENOUS | Status: DC | PRN
  Administered 2014-10-11: 15:00:00 via INTRAVENOUS

## 2014-10-11 MED ORDER — MORPHINE SULFATE 0.5 MG/ML IJ SOLN
INTRAMUSCULAR | Status: AC
  Filled 2014-10-11: qty 10

## 2014-10-11 MED ORDER — MEPERIDINE HCL 25 MG/ML IJ SOLN: 6.2500 mg | INTRAMUSCULAR | Status: DC | PRN

## 2014-10-11 MED ORDER — PROMETHAZINE HCL 25 MG/ML IJ SOLN: 6.2500 mg | INTRAMUSCULAR | Status: DC | PRN

## 2014-10-11 MED ORDER — NALBUPHINE HCL 10 MG/ML IJ SOLN: 5.0000 mg | Freq: Once | INTRAMUSCULAR | Status: AC | PRN

## 2014-10-11 MED ORDER — SCOPOLAMINE 1 MG/3DAYS TD PT72
MEDICATED_PATCH | TRANSDERMAL | Status: DC | PRN
  Administered 2014-10-11: 1 via TRANSDERMAL

## 2014-10-11 MED ORDER — LANOLIN HYDROUS EX OINT: 1.0000 "application " | TOPICAL_OINTMENT | CUTANEOUS | Status: DC | PRN

## 2014-10-11 MED ORDER — OXYCODONE-ACETAMINOPHEN 5-325 MG PO TABS
2.0000 | ORAL_TABLET | ORAL | Status: DC | PRN
  Administered 2014-10-13 (×4): 2 via ORAL
  Filled 2014-10-11 (×4): qty 2

## 2014-10-11 MED ORDER — FENTANYL CITRATE 0.05 MG/ML IJ SOLN
INTRAMUSCULAR | Status: AC
  Filled 2014-10-11: qty 2

## 2014-10-11 MED ORDER — LACTATED RINGERS IV SOLN: INTRAVENOUS | Status: DC

## 2014-10-11 MED ORDER — NALOXONE HCL 1 MG/ML IJ SOLN
1.0000 ug/kg/h | INTRAVENOUS | Status: DC | PRN
  Filled 2014-10-11: qty 2

## 2014-10-11 MED ORDER — ACETAMINOPHEN 500 MG PO TABS
1000.0000 mg | ORAL_TABLET | Freq: Four times a day (QID) | ORAL | Status: AC
  Administered 2014-10-11 – 2014-10-12 (×3): 1000 mg via ORAL
  Filled 2014-10-11 (×4): qty 2

## 2014-10-11 MED ORDER — FENTANYL CITRATE 0.05 MG/ML IJ SOLN: 25.0000 ug | INTRAMUSCULAR | Status: DC | PRN

## 2014-10-11 MED ORDER — FAMOTIDINE IN NACL 20-0.9 MG/50ML-% IV SOLN
20.0000 mg | Freq: Once | INTRAVENOUS | Status: AC
  Administered 2014-10-11: 20 mg via INTRAVENOUS
  Filled 2014-10-11: qty 50

## 2014-10-11 MED ORDER — CITRIC ACID-SODIUM CITRATE 334-500 MG/5ML PO SOLN
30.0000 mL | Freq: Once | ORAL | Status: AC
  Administered 2014-10-11: 30 mL via ORAL
  Filled 2014-10-11: qty 15

## 2014-10-11 MED ORDER — IBUPROFEN 600 MG PO TABS
600.0000 mg | ORAL_TABLET | Freq: Four times a day (QID) | ORAL | Status: DC | PRN
Start: 2014-10-11 — End: 2014-10-14
  Administered 2014-10-13 (×2): 600 mg via ORAL
  Filled 2014-10-11: qty 1

## 2014-10-11 MED ORDER — OXYTOCIN 40 UNITS IN LACTATED RINGERS INFUSION - SIMPLE MED: 62.5000 mL/h | INTRAVENOUS | Status: AC

## 2014-10-11 MED ORDER — SCOPOLAMINE 1 MG/3DAYS TD PT72
MEDICATED_PATCH | TRANSDERMAL | Status: AC
  Filled 2014-10-11: qty 1

## 2014-10-11 MED ORDER — ONDANSETRON HCL 4 MG/2ML IJ SOLN
INTRAMUSCULAR | Status: AC
  Filled 2014-10-11: qty 2

## 2014-10-11 MED ORDER — MIDAZOLAM HCL 2 MG/2ML IJ SOLN: 0.5000 mg | Freq: Once | INTRAMUSCULAR | Status: DC | PRN

## 2014-10-11 MED ORDER — SODIUM CHLORIDE 0.9 % IJ SOLN
3.0000 mL | INTRAMUSCULAR | Status: DC | PRN
Start: 2014-10-11 — End: 2014-10-14

## 2014-10-11 MED ORDER — KETOROLAC TROMETHAMINE 30 MG/ML IJ SOLN
INTRAMUSCULAR | Status: AC
Start: 2014-10-11 — End: 2014-10-12
  Filled 2014-10-11: qty 1

## 2014-10-11 MED ORDER — PHENYLEPHRINE HCL 10 MG/ML IJ SOLN
INTRAMUSCULAR | Status: AC
  Filled 2014-10-11: qty 1

## 2014-10-11 MED ORDER — DIBUCAINE 1 % RE OINT: 1.0000 "application " | TOPICAL_OINTMENT | RECTAL | Status: DC | PRN

## 2014-10-11 MED ORDER — WITCH HAZEL-GLYCERIN EX PADS: 1.0000 "application " | MEDICATED_PAD | CUTANEOUS | Status: DC | PRN

## 2014-10-11 MED ORDER — PRENATAL MULTIVITAMIN CH
1.0000 | ORAL_TABLET | Freq: Every day | ORAL | Status: DC
  Administered 2014-10-12 – 2014-10-13 (×2): 1 via ORAL
  Filled 2014-10-11 (×2): qty 1

## 2014-10-11 MED ORDER — ZOLPIDEM TARTRATE 5 MG PO TABS: 5.0000 mg | ORAL_TABLET | Freq: Every evening | ORAL | Status: DC | PRN

## 2014-10-11 MED ORDER — NALBUPHINE HCL 10 MG/ML IJ SOLN
5.0000 mg | INTRAMUSCULAR | Status: DC | PRN
  Filled 2014-10-11: qty 1

## 2014-10-11 MED ORDER — LACTATED RINGERS IV SOLN
INTRAVENOUS | Status: DC
  Administered 2014-10-11 (×2): via INTRAVENOUS

## 2014-10-11 MED ORDER — IBUPROFEN 600 MG PO TABS
600.0000 mg | ORAL_TABLET | Freq: Four times a day (QID) | ORAL | Status: DC
  Administered 2014-10-12 – 2014-10-14 (×9): 600 mg via ORAL
  Filled 2014-10-11 (×11): qty 1

## 2014-10-11 MED ORDER — SCOPOLAMINE 1 MG/3DAYS TD PT72
1.0000 | MEDICATED_PATCH | Freq: Once | TRANSDERMAL | Status: DC
  Filled 2014-10-11: qty 1

## 2014-10-11 MED ORDER — ONDANSETRON HCL 4 MG/2ML IJ SOLN
4.0000 mg | Freq: Three times a day (TID) | INTRAMUSCULAR | Status: DC | PRN
Start: 2014-10-11 — End: 2014-10-14

## 2014-10-11 MED ORDER — SIMETHICONE 80 MG PO CHEW
80.0000 mg | CHEWABLE_TABLET | Freq: Three times a day (TID) | ORAL | Status: DC
  Administered 2014-10-12 – 2014-10-14 (×6): 80 mg via ORAL
  Filled 2014-10-11 (×6): qty 1

## 2014-10-11 MED ORDER — DIPHENHYDRAMINE HCL 50 MG/ML IJ SOLN
INTRAMUSCULAR | Status: DC | PRN
  Administered 2014-10-11 (×2): 12.5 mg via INTRAVENOUS

## 2014-10-11 MED ORDER — DIPHENHYDRAMINE HCL 50 MG/ML IJ SOLN
INTRAMUSCULAR | Status: AC
  Filled 2014-10-11: qty 2

## 2014-10-11 MED ORDER — NALBUPHINE HCL 10 MG/ML IJ SOLN
INTRAMUSCULAR | Status: AC
  Administered 2014-10-11: 5 mg via SUBCUTANEOUS
  Filled 2014-10-11: qty 1

## 2014-10-11 MED ORDER — NALOXONE HCL 0.4 MG/ML IJ SOLN
0.4000 mg | INTRAMUSCULAR | Status: DC | PRN
Start: 2014-10-11 — End: 2014-10-14

## 2014-10-11 MED ORDER — ONDANSETRON HCL 4 MG/2ML IJ SOLN
INTRAMUSCULAR | Status: DC | PRN
  Administered 2014-10-11: 4 mg via INTRAVENOUS

## 2014-10-11 MED ORDER — MENTHOL 3 MG MT LOZG: 1.0000 | LOZENGE | OROMUCOSAL | Status: DC | PRN

## 2014-10-11 MED ORDER — DIPHENHYDRAMINE HCL 25 MG PO CAPS
25.0000 mg | ORAL_CAPSULE | Freq: Four times a day (QID) | ORAL | Status: DC | PRN
Start: 2014-10-11 — End: 2014-10-14

## 2014-10-11 MED ORDER — ONDANSETRON HCL 4 MG PO TABS
4.0000 mg | ORAL_TABLET | ORAL | Status: DC | PRN
  Administered 2014-10-13 – 2014-10-14 (×2): 4 mg via ORAL
  Filled 2014-10-11 (×2): qty 1

## 2014-10-11 MED ORDER — TETANUS-DIPHTH-ACELL PERTUSSIS 5-2.5-18.5 LF-MCG/0.5 IM SUSP: 0.5000 mL | Freq: Once | INTRAMUSCULAR | Status: DC

## 2014-10-11 MED ORDER — DIPHENHYDRAMINE HCL 25 MG PO CAPS: 25.0000 mg | ORAL_CAPSULE | ORAL | Status: DC | PRN

## 2014-10-11 MED ORDER — LACTATED RINGERS IV SOLN
INTRAVENOUS | Status: DC
  Administered 2014-10-12: 01:00:00 via INTRAVENOUS

## 2014-10-11 MED ORDER — OXYTOCIN 10 UNIT/ML IJ SOLN
40.0000 [IU] | INTRAVENOUS | Status: DC | PRN
  Administered 2014-10-11: 40 [IU] via INTRAVENOUS

## 2014-10-11 MED ORDER — SENNOSIDES-DOCUSATE SODIUM 8.6-50 MG PO TABS
2.0000 | ORAL_TABLET | ORAL | Status: DC
  Administered 2014-10-12 – 2014-10-13 (×3): 2 via ORAL
  Filled 2014-10-11 (×3): qty 2

## 2014-10-11 MED ORDER — NALBUPHINE HCL 10 MG/ML IJ SOLN
5.0000 mg | INTRAMUSCULAR | Status: DC | PRN
  Administered 2014-10-11 – 2014-10-12 (×2): 5 mg via INTRAVENOUS
  Filled 2014-10-11: qty 1

## 2014-10-11 MED ORDER — SIMETHICONE 80 MG PO CHEW: 80.0000 mg | CHEWABLE_TABLET | ORAL | Status: DC | PRN

## 2014-10-11 MED ORDER — KETOROLAC TROMETHAMINE 30 MG/ML IJ SOLN
30.0000 mg | Freq: Four times a day (QID) | INTRAMUSCULAR | Status: DC | PRN
Start: 2014-10-11 — End: 2014-10-11
  Administered 2014-10-11: 30 mg via INTRAMUSCULAR

## 2014-10-11 MED ORDER — CEFAZOLIN SODIUM-DEXTROSE 2-3 GM-% IV SOLR
2.0000 g | INTRAVENOUS | Status: AC
  Administered 2014-10-11 (×2): 2 g via INTRAVENOUS
  Filled 2014-10-11: qty 50

## 2014-10-11 MED ORDER — KETOROLAC TROMETHAMINE 30 MG/ML IJ SOLN: 15.0000 mg | Freq: Once | INTRAMUSCULAR | Status: DC | PRN

## 2014-10-11 MED ORDER — MORPHINE SULFATE (PF) 0.5 MG/ML IJ SOLN
INTRAMUSCULAR | Status: DC | PRN
  Administered 2014-10-11: .15 mg via INTRATHECAL

## 2014-10-11 MED ORDER — ONDANSETRON HCL 4 MG/2ML IJ SOLN
4.0000 mg | INTRAMUSCULAR | Status: DC | PRN
  Administered 2014-10-11: 4 mg via INTRAVENOUS
  Filled 2014-10-11: qty 2

## 2014-10-11 MED ORDER — PHENYLEPHRINE 8 MG IN D5W 100 ML (0.08MG/ML) PREMIX OPTIME
INJECTION | INTRAVENOUS | Status: DC | PRN
  Administered 2014-10-11: 60 ug/min via INTRAVENOUS

## 2014-10-11 MED ORDER — OXYCODONE-ACETAMINOPHEN 5-325 MG PO TABS
1.0000 | ORAL_TABLET | ORAL | Status: DC | PRN
  Administered 2014-10-12 – 2014-10-14 (×6): 1 via ORAL
  Filled 2014-10-11 (×6): qty 1

## 2014-10-11 MED ORDER — NALBUPHINE HCL 10 MG/ML IJ SOLN
5.0000 mg | Freq: Once | INTRAMUSCULAR | Status: AC | PRN
  Administered 2014-10-11: 5 mg via SUBCUTANEOUS

## 2014-10-11 SURGICAL SUPPLY — 34 items
BARRIER ADHS 3X4 INTERCEED (GAUZE/BANDAGES/DRESSINGS) IMPLANT
BENZOIN TINCTURE PRP APPL 2/3 (GAUZE/BANDAGES/DRESSINGS) ×3 IMPLANT
CLAMP CORD UMBIL (MISCELLANEOUS) IMPLANT
CLOSURE WOUND 1/2 X4 (GAUZE/BANDAGES/DRESSINGS) ×1
CLOTH BEACON ORANGE TIMEOUT ST (SAFETY) ×3 IMPLANT
CONTAINER PREFILL 10% NBF 15ML (MISCELLANEOUS) IMPLANT
DRAPE SHEET LG 3/4 BI-LAMINATE (DRAPES) IMPLANT
DRSG OPSITE POSTOP 4X10 (GAUZE/BANDAGES/DRESSINGS) ×3 IMPLANT
DURAPREP 26ML APPLICATOR (WOUND CARE) ×3 IMPLANT
ELECT REM PT RETURN 9FT ADLT (ELECTROSURGICAL) ×3
ELECTRODE REM PT RTRN 9FT ADLT (ELECTROSURGICAL) ×1 IMPLANT
EXTRACTOR VACUUM M CUP 4 TUBE (SUCTIONS) IMPLANT
EXTRACTOR VACUUM M CUP 4' TUBE (SUCTIONS)
GLOVE BIO SURGEON STRL SZ 6.5 (GLOVE) ×2 IMPLANT
GLOVE BIO SURGEONS STRL SZ 6.5 (GLOVE) ×1
GOWN STRL REUS W/TWL LRG LVL3 (GOWN DISPOSABLE) ×6 IMPLANT
KIT ABG SYR 3ML LUER SLIP (SYRINGE) IMPLANT
NEEDLE HYPO 22GX1.5 SAFETY (NEEDLE) IMPLANT
NEEDLE HYPO 25X5/8 SAFETYGLIDE (NEEDLE) ×3 IMPLANT
NS IRRIG 1000ML POUR BTL (IV SOLUTION) ×3 IMPLANT
PACK C SECTION WH (CUSTOM PROCEDURE TRAY) ×3 IMPLANT
PAD OB MATERNITY 4.3X12.25 (PERSONAL CARE ITEMS) ×3 IMPLANT
STAPLER VISISTAT 35W (STAPLE) IMPLANT
STRIP CLOSURE SKIN 1/2X4 (GAUZE/BANDAGES/DRESSINGS) ×2 IMPLANT
SUT CHROMIC 0 CTX 36 (SUTURE) ×6 IMPLANT
SUT PLAIN 0 NONE (SUTURE) IMPLANT
SUT PLAIN 2 0 XLH (SUTURE) IMPLANT
SUT VIC AB 0 CT1 27 (SUTURE) ×6
SUT VIC AB 0 CT1 27XBRD ANBCTR (SUTURE) ×3 IMPLANT
SUT VIC AB 4-0 KS 27 (SUTURE) IMPLANT
SYR CONTROL 10ML LL (SYRINGE) IMPLANT
TOWEL OR 17X24 6PK STRL BLUE (TOWEL DISPOSABLE) ×3 IMPLANT
TRAY FOLEY CATH 14FR (SET/KITS/TRAYS/PACK) ×3 IMPLANT
WATER STERILE IRR 1000ML POUR (IV SOLUTION) ×3 IMPLANT

## 2014-10-11 NOTE — MAU Provider Note (Signed)
  History     CSN: [id number]  Arrival date and time: 10/11/14 1049   First Provider Initiated Contact with Patient 10/11/14 1147      Chief Complaint  Patient presents with  . Labor Eval   HPI Sydney Rios 33 y.o. [id number] @[redacted]w[redacted]d  presents to MAU with complaint of decreased fetal movement and contractions.   She has noticed a couple of strong kicks from the baby since being here.  Her contractions started overnight and have not been regular.  They are stronger today than previous Braxton-Hicks.  She denies vaginal bleeding, LOF, dysuria.  She has a c-section scheduled on 10/23.   OB History   Grav Para Term Preterm Abortions TAB SAB Ect Mult Living   3 2 2       2       Past Medical History  Diagnosis Date  . PONV (postoperative nausea and vomiting)   . No pertinent past medical history   . Chronic kidney disease     hx kidney stones    Past Surgical History  Procedure Laterality Date  . Cesarean section    . No past surgeries    . Cesarean section  01/23/2013    Procedure: CESAREAN SECTION;  Surgeon: Luz Lex, MD;  Location: [facility];  Service: Obstetrics;  Laterality: N/A;    History reviewed. No pertinent family history.  History  Substance Use Topics  . Smoking status: Never Smoker   . Smokeless tobacco: Never Used  . Alcohol Use: No    Allergies: No Known Allergies  Prescriptions prior to admission  Medication Sig Dispense Refill  . acetaminophen (TYLENOL) 500 MG tablet Take 1,000 mg by mouth every 6 (six) hours as needed for mild pain.      . calcium carbonate (TUMS - DOSED IN MG ELEMENTAL CALCIUM) 500 MG chewable tablet Chew 2 tablets by mouth 3 (three) times daily as needed for indigestion or heartburn.      . Prenatal Vit-Fe Fumarate-FA (PRENATAL MULTIVITAMIN) TABS Take 1 tablet by mouth daily.      Marland Kitchen acyclovir (ZOVIRAX) 400 MG tablet Take 400 mg by mouth as needed (fever blisters).        ROS Physical Exam   Blood pressure 126/75, pulse 86,  temperature 99.3 F (37.4 C), temperature source Oral, resp. rate 16, height 5' 7.5" (1.715 m), weight 188 lb 9.6 oz (85.548 kg), SpO2 99.00%, unknown if currently breastfeeding.  Physical Exam  Constitutional: She is oriented to person, place, and time. She appears well-developed and well-nourished.  HENT:  Head: Normocephalic and atraumatic.  Eyes: EOM are normal.  Neck: Normal range of motion.  Cardiovascular: Normal rate and regular rhythm.   Respiratory: Effort normal and breath sounds normal. No respiratory distress.  GI: Soft. Bowel sounds are normal. She exhibits no distension.  Musculoskeletal: Normal range of motion.  Neurological: She is alert and oriented to person, place, and time.  Skin: Skin is warm and dry.  Psychiatric: She has a normal mood and affect.    MAU Course  Procedures  MDM Dr. Helane Rima arrived in MAU and assumed care of patient at approximately 12:00pm.  Assessment and Plan  A: Contractions at 38 weeks  P: Care turned over to Dr. Helane Rima.  Plan for c-section.  Paticia Stack 10/11/2014, 11:49 AM

## 2014-10-11 NOTE — Brief Op Note (Signed)
10/11/2014  2:54 PM  PATIENT:  Marcille Buffy  33 y.o. female  PRE-OPERATIVE DIAGNOSIS:   IUP at 38 weeks Previous C Section x 2  Labor   POST-OPERATIVE DIAGNOSIS:   Same  PROCEDURE:  Procedure(s): CESAREAN SECTION (N/A)  SURGEON:  Surgeon(s) and Role:    * Cyril Mourning, MD - Primary  PHYSICIAN ASSISTANT:   ASSISTANTS: none   ANESTHESIA:   spinal  EBL:  Total I/O In: 1000 [I.V.:1000] Out: 350 [Urine:50; Blood:300]  BLOOD ADMINISTERED:none  DRAINS: Urinary Catheter (Foley)   LOCAL MEDICATIONS USED:  NONE  SPECIMEN:  No Specimen  DISPOSITION OF SPECIMEN:  N/A  COUNTS:  YES  TOURNIQUET:  * No tourniquets in log *  DICTATION: .Other Dictation: Dictation Number [id number]  PLAN OF CARE: Admit to inpatient   PATIENT DISPOSITION:  PACU - hemodynamically stable.   Delay start of Pharmacological VTE agent (>24hrs) due to surgical blood loss or risk of bleeding: not applicable

## 2014-10-11 NOTE — Anesthesia Preprocedure Evaluation (Signed)
Anesthesia Evaluation  Patient identified by MRN, date of birth, ID band Patient awake    Reviewed: Allergy & Precautions, H&P , NPO status , Patient's Chart, lab work & pertinent test results  History of Anesthesia Complications (+) PONV and history of anesthetic complications  Airway Mallampati: II      Dental   Pulmonary  breath sounds clear to auscultation        Cardiovascular Exercise Tolerance: Good Rhythm:regular Rate:Normal     Neuro/Psych    GI/Hepatic   Endo/Other    Renal/GU      Musculoskeletal   Abdominal   Peds  Hematology   Anesthesia Other Findings   Reproductive/Obstetrics (+) Pregnancy                           Anesthesia Physical Anesthesia Plan  ASA: II and emergent  Anesthesia Plan: Spinal   Post-op Pain Management:    Induction:   Airway Management Planned:   Additional Equipment:   Intra-op Plan:   Post-operative Plan:   Informed Consent: I have reviewed the patients History and Physical, chart, labs and discussed the procedure including the risks, benefits and alternatives for the proposed anesthesia with the patient or authorized representative who has indicated his/her understanding and acceptance.     Plan Discussed with: Anesthesiologist, CRNA and Surgeon  Anesthesia Plan Comments:         Anesthesia Quick Evaluation

## 2014-10-11 NOTE — Anesthesia Postprocedure Evaluation (Signed)
  Anesthesia Post Note  Patient: Sydney Rios  Procedure(s) Performed: Procedure(s) (LRB): CESAREAN SECTION (N/A)  Anesthesia type: Spinal  Patient location: PACU  Post pain: Pain level controlled  Post assessment: Post-op Vital signs reviewed  Last Vitals:  Filed Vitals:   10/11/14 1530  BP: 112/98  Pulse: 66  Temp:   Resp: 20    Post vital signs: Reviewed  Level of consciousness: awake  Complications: No apparent anesthesia complications

## 2014-10-11 NOTE — H&P (Signed)
Sydney Rios is a 33 year old G 3 P 2002 at 30 w 0 day presents in labor. Previous C Section x 2. History of LGA x 2. Maternal Medical History:  Reason for admission: Contractions.   Contractions: Onset was 3-5 hours ago.   Frequency: regular.   Perceived severity is moderate.    Fetal activity: Perceived fetal activity is decreased.      OB History   Grav Para Term Preterm Abortions TAB SAB Ect Mult Living   3 2 2       2      Past Medical History  Diagnosis Date  . PONV (postoperative nausea and vomiting)   . No pertinent past medical history   . Chronic kidney disease     hx kidney stones   Past Surgical History  Procedure Laterality Date  . Cesarean section    . No past surgeries    . Cesarean section  01/23/2013    Procedure: CESAREAN SECTION;  Surgeon: Luz Lex, MD;  Location: [facility];  Service: Obstetrics;  Laterality: N/A;   Family History: family history is not on file. Social History:  reports that she has never smoked. She has never used smokeless tobacco. She reports that she does not drink alcohol or use illicit drugs.   Prenatal Transfer Tool  Maternal Diabetes: No Genetic Screening: Normal Maternal Ultrasounds/Referrals: Normal Fetal Ultrasounds or other Referrals:  None Maternal Substance Abuse:  No Significant Maternal Medications:  None Significant Maternal Lab Results:  None Other Comments:  None  Review of Systems  All other systems reviewed and are negative.     Blood pressure 126/75, pulse 86, temperature 99.3 F (37.4 C), temperature source Oral, resp. rate 16, height 5' 7.5" (1.715 m), weight 85.548 kg (188 lb 9.6 oz), SpO2 99.00%, unknown if currently breastfeeding. Maternal Exam:  Uterine Assessment: Contraction frequency is regular.   Abdomen: Surgical scars: low transverse.   Fetal presentation: vertex     Fetal Exam Fetal State Assessment: Category I - tracings are normal.     Physical Exam  Nursing note and vitals  reviewed. Constitutional: She appears well-developed.  HENT:  Head: Normocephalic.  Eyes: Pupils are equal, round, and reactive to light.  Neck: Normal range of motion.  Cardiovascular: Normal rate and regular rhythm.   Respiratory: Effort normal.  GI: Soft.    Prenatal labs: ABO, Rh: --/--/A POS (10/14 1330) Antibody: NEG (10/14 1330) Rubella: Immune (03/14 0000) RPR: NON REAC (10/14 1330)  HBsAg: Negative (03/14 0000)  HIV: Non-reactive (03/14 0000)  GBS:     Assessment/Plan: IUP  At 38 weeks Previous C Section x 2 Labor Repeat LTCS    Sena Hoopingarner L 10/11/2014, 12:01 PM

## 2014-10-11 NOTE — Transfer of Care (Signed)
Immediate Anesthesia Transfer of Care Note  Patient: Sydney Rios  Procedure(s) Performed: Procedure(s): CESAREAN SECTION (N/A)  Patient Location: PACU  Anesthesia Type:Spinal  Level of Consciousness: awake, alert  and oriented  Airway & Oxygen Therapy: Patient Spontanous Breathing  Post-op Assessment: Report given to PACU RN and Post -op Vital signs reviewed and stable  Post vital signs: Reviewed and stable  Complications: No apparent anesthesia complications

## 2014-10-11 NOTE — MAU Note (Signed)
Patient states she is scheduled for a repeat cesarean section on 10-23. States she is having contractions every 10 minutes. Denies bleeding or leaking. Reports less fetal movement than usual.

## 2014-10-11 NOTE — Lactation Note (Addendum)
This note was copied from the chart of Sydney Lesbia Ottaway. Lactation Consultation Note Initial visit at 7 hours of age.  Mom reports feeling some pinching with latch, but has not had a good position due to being post op.  Mom had an older child with a tongue tie and lip tie that required revision due to breast feeding difficulty.  Oral assessment with gloved finger done with baby who has a thick labial frenulum at the base of the upper gum ridge.  Tongue seems to have good mobility, but frenulum not visualized and will continue to need assessment.  Baby latches well in cross cradle hold with wide mouth and deep latch.  Audible swallows heard for several minutes.  Mom continues to work on positioning.  Baby gets sleepy and slows sucking with long pauses and mom complains of pain and nipple is compressed when baby comes off.  Encouraged mom to keep baby active during feeding to help prevent that.   [facility] LC resources given and discussed.  Encouraged to feed with early cues on demand.  Early newborn behavior discussed.  Hand expression demonstrated with large amounts of colostrum visible.  Mom to call for assist as needed.    Patient Name: Sydney Rios LOVFI'E Date: 10/11/2014 Reason for consult: Initial assessment   Maternal Data Has patient been taught Hand Expression?: Yes Does the patient have breastfeeding experience prior to this delivery?: Yes  Feeding Feeding Type: Breast Fed Length of feed: 10 min  LATCH Score/Interventions Latch: Grasps breast easily, tongue down, lips flanged, rhythmical sucking.  Audible Swallowing: Spontaneous and intermittent Intervention(s): Skin to skin;Hand expression  Type of Nipple: Everted at rest and after stimulation  Comfort (Breast/Nipple): Soft / non-tender     Hold (Positioning): Assistance needed to correctly position infant at breast and maintain latch. Intervention(s): Skin to skin;Position options;Support Pillows;Breastfeeding basics  reviewed  LATCH Score: 9  Lactation Tools Discussed/Used     Consult Status Consult Status: Follow-up Date: 10/12/14 Follow-up type: In-patient    Sydney Rios 10/11/2014, 9:41 PM

## 2014-10-11 NOTE — Anesthesia Procedure Notes (Signed)
Spinal  Patient location during procedure: OR Start time: 10/11/2014 2:16 PM Staffing Anesthesiologist: Rudean Curt Performed by: anesthesiologist  Preanesthetic Checklist Completed: patient identified, site marked, surgical consent, pre-op evaluation, timeout performed, IV checked, risks and benefits discussed and monitors and equipment checked Spinal Block Patient position: sitting Prep: DuraPrep Patient monitoring: heart rate, cardiac monitor, continuous pulse ox and blood pressure Approach: midline Location: L3-4 Injection technique: single-shot Needle Needle type: Sprotte  Needle gauge: 24 G Needle length: 9 cm Assessment Sensory level: T4 Additional Notes Patient identified.  Risk benefits discussed including failed block, incomplete pain control, headache, nerve damage, paralysis, blood pressure changes, nausea, vomiting, reactions to medication both toxic or allergic, and postpartum back pain.  Patient expressed understanding and wished to proceed.  All questions were answered.  Sterile technique used throughout procedure.  CSF was clear.  No parasthesia or other complications.  Please see nursing notes for vital signs.

## 2014-10-12 LAB — CBC
HEMATOCRIT: 32.3 % — AB (ref 36.0–46.0)
HEMOGLOBIN: 10.5 g/dL — AB (ref 12.0–15.0)
MCH: 27.4 pg (ref 26.0–34.0)
MCHC: 32.5 g/dL (ref 30.0–36.0)
MCV: 84.3 fL (ref 78.0–100.0)
Platelets: 170 10*3/uL (ref 150–400)
RBC: 3.83 MIL/uL — ABNORMAL LOW (ref 3.87–5.11)
RDW: 13.9 % (ref 11.5–15.5)
WBC: 8.8 10*3/uL (ref 4.0–10.5)

## 2014-10-12 NOTE — Op Note (Signed)
[name], Sydney Rios NO.:  [id number]  MEDICAL RECORD NO.:  [id number]  LOCATION:  9129                          FACILITY:  [facility]  PHYSICIAN:  Rayana Geurin L. Jacquelyne Quarry, M.D.DATE OF BIRTH:  April 24, 1981  DATE OF PROCEDURE:  10/11/2014 DATE OF DISCHARGE:                              OPERATIVE REPORT   PREOPERATIVE DIAGNOSIS: 1. Intrauterine pregnancy at 38 weeks. 2. Previous cesarean section x2. 3. Labor.  POSTOPERATIVE DIAGNOSIS: 1. Intrauterine pregnancy at 38 weeks. 2. Previous cesarean section x2. 3. Labor.  PROCEDURE:  Repeat low transverse cesarean section.  SURGEON:  Chele Cornell L. Kshawn Canal, MD  ANESTHESIA:  Spinal.  EBL:  Less than 500 mL.  COMPLICATIONS:  None.  DRAINS:  Foley catheter.  DESCRIPTION OF PROCEDURE:  Patient was taken to the operating room. Spinal was placed.  She was then prepped and draped in usual sterile fashion.  A low transverse incision was made, carried down to the fascia.  Fascia was scored in the midline extended laterally.  Rectus muscles were separated in the midline.  The peritoneum was entered bluntly.  The peritoneal incision was then stretched.  The lower uterine segment was identified.  The bladder flap was created sharply and digitally.  The bladder blade was then readjusted.  A low transverse incision was made in the uterus.  Uterus was entered using hemostat. Amniotic fluid was clear.  The baby was in cephalic presentation, was delivered easily with 1 gentle pull of the vacuum.  The baby was a female infant, Apgars 8 at one minute and 9 at five minutes.  The cord was clamped and cut.  The baby was handed to the awaiting neonatal team. The uterus was exteriorized after the placenta was delivered manually and noted to be normal intact with a three-vessel cord and the uterine incision was closed in 1 layer using 0 chromic in a running, locked stitch.  The uterus was returned to the abdomen.  Irrigation was performed.   Hemostasis was excellent.  All sponge, lap, and instrument counts were correct x2.  The peritoneum was closed using 0 Vicryl.  The fascia was closed using 0 Vicryl and the skin was closed using 4-0 Vicryl.  Steri- Strips and dressing were applied.  All sponge, lap, instrument counts were correct x2.  Patient went to recovery room in stable condition.     [name], M.D.     [name]  D:  10/11/2014  T:  10/12/2014  Job:  [id number]

## 2014-10-12 NOTE — Anesthesia Postprocedure Evaluation (Signed)
  Anesthesia Post-op Note  Patient: Sydney Rios  Procedure(s) Performed: Procedure(s): CESAREAN SECTION (N/A)  Patient Location: Mother/Baby  Anesthesia Type:Spinal  Level of Consciousness: awake  Airway and Oxygen Therapy: Patient Spontanous Breathing  Post-op Pain: mild  Post-op Assessment: Patient's Cardiovascular Status Stable and Respiratory Function Stable  Post-op Vital Signs: stable  Last Vitals:  Filed Vitals:   10/12/14 0416  BP: 111/60  Pulse: 77  Temp: 37.3 C  Resp: 18    Complications: No apparent anesthesia complications

## 2014-10-12 NOTE — Progress Notes (Signed)
Subjective: Postpartum Day 1 Cesarean Delivery Patient reports incisional pain and tolerating PO.    Objective: Vital signs in last 24 hours: Temp:  [97.7 F (36.5 C)-99.3 F (37.4 C)] 99.2 F (37.3 C) (10/17 0416) Pulse Rate:  [47-86] 77 (10/17 0416) Resp:  [16-20] 18 (10/17 0416) BP: (96-126)/(44-98) 111/60 mmHg (10/17 0416) SpO2:  [95 %-100 %] 97 % (10/17 0416) Weight:  [188 lb 9.6 oz (85.548 kg)] 188 lb 9.6 oz (85.548 kg) (10/16 1104)  Physical Exam:  General: alert, cooperative and appears stated age 33: appropriate Uterine Fundus: firm Incision: healing well, no significant drainage, no dehiscence, no significant erythema DVT Evaluation: No evidence of DVT seen on physical exam.   Recent Labs  10/11/14 1225 10/12/14 0604  HGB 11.3* 10.5*  HCT 33.8* 32.3*    Assessment/Plan: Status post Cesarean section. Doing well postoperatively.  Continue current care.  Axcel Horsch L 10/12/2014, 7:56 AM

## 2014-10-12 NOTE — Addendum Note (Signed)
Addendum created 10/12/14 1448 by Ignacia Bayley, CRNA   Modules edited: Notes Section   Notes Section:  File: [id number]

## 2014-10-13 NOTE — Plan of Care (Signed)
Problem: Phase II Progression Outcomes Goal: Other Phase II Outcomes/Goals Outcome: Progressing Sore nipples will improve with hand expression, and comfort jells.

## 2014-10-13 NOTE — Progress Notes (Signed)
Subjective: Postpartum Day 2: Cesarean Delivery Patient reports incisional pain and tolerating PO.    Objective: Vital signs in last 24 hours: Temp:  [96.3 F (35.7 C)-98.4 F (36.9 C)] 98.4 F (36.9 C) (10/18 0501) Pulse Rate:  [62-75] 62 (10/18 0501) Resp:  [18] 18 (10/18 0501) BP: (110-122)/(60-72) 122/61 mmHg (10/18 0501) SpO2:  [100 %] 100 % (10/18 0501)  Physical Exam:  General: alert, cooperative and appears stated age Lochia: appropriate Uterine Fundus: firm Incision: healing well, no significant drainage, no dehiscence, no significant erythema DVT Evaluation: No evidence of DVT seen on physical exam.   Recent Labs  10/11/14 1225 10/12/14 0604  HGB 11.3* 10.5*  HCT 33.8* 32.3*    Assessment/Plan: Status post Cesarean section. Doing well postoperatively.  Continue current care.  Sydney Rios L 10/13/2014, 8:59 AM

## 2014-10-14 ENCOUNTER — Encounter (HOSPITAL_COMMUNITY): Payer: Self-pay | Admitting: Obstetrics and Gynecology

## 2014-10-14 MED ORDER — IBUPROFEN 600 MG PO TABS: 600.0000 mg | ORAL_TABLET | Freq: Four times a day (QID) | ORAL | Status: DC | PRN

## 2014-10-14 MED ORDER — OXYCODONE-ACETAMINOPHEN 5-325 MG PO TABS: 1.0000 | ORAL_TABLET | ORAL | Status: DC | PRN

## 2014-10-14 NOTE — Discharge Summary (Signed)
Obstetric Discharge Summary Reason for Admission: onset of labor Prenatal Procedures: ultrasound Intrapartum Procedures: cesarean: low cervical, transverse Postpartum Procedures: none Complications-Operative and Postpartum: none Hemoglobin  Date Value Ref Range Status  10/12/2014 10.5* 12.0 - 15.0 g/dL Final     HCT  Date Value Ref Range Status  10/12/2014 32.3* 36.0 - 46.0 % Final    Physical Exam:  General: alert and cooperative Lochia: appropriate Uterine Fundus: firm Incision: healing well DVT Evaluation: No evidence of DVT seen on physical exam. Negative Homan's sign. No cords or calf tenderness. No significant calf/ankle edema.  Discharge Diagnoses: Term Pregnancy-delivered  Discharge Information: Date: 10/14/2014 Activity: pelvic rest Diet: routine Medications: PNV, Ibuprofen and Percocet Condition: stable Instructions: refer to practice specific booklet Discharge to: home   Newborn Data: Live born female  Birth Weight: 9 lb 5.9 oz (4250 g) APGAR: 8, 9  Home with mother.  Corayma Cashatt G 10/14/2014, 8:34 AM

## 2014-10-14 NOTE — Lactation Note (Signed)
This note was copied from the chart of Sydney Thetis Schwimmer. Lactation Consultation Note Experienced BF mom, baby had 10% wt. Loss. Has high palate, upper lip labia frenulum. Moves tongue freely out past gum line, curves upwards when cried. Bites some on gloved finger. Mom likes side lying BF position. Mom has been supplementing w/formula as well. Mom states her other two children had tongue ties as well. Discussed options of BF w/limited tongue movement and sore nipples. Monitoring out put and input is important d/t "milk transfer" from tongue. Encouraged to keep good I&O log and give to Dr.  Patient Name: Sydney Rios [id number] Date: 10/14/2014 Reason for consult: Follow-up assessment;Infant weight loss   Maternal Data    Feeding Feeding Type: Breast Fed Length of feed: 20 min (still BF)  LATCH Score/Interventions Latch: Grasps breast easily, tongue down, lips flanged, rhythmical sucking.  Audible Swallowing: Spontaneous and intermittent Intervention(s): Hand expression  Type of Nipple: Everted at rest and after stimulation  Comfort (Breast/Nipple): Filling, red/small blisters or bruises, mild/mod discomfort  Problem noted: Mild/Moderate discomfort Interventions  (Cracked/bleeding/bruising/blister): Expressed breast milk to nipple Interventions (Mild/moderate discomfort): Comfort gels;Hand expression;Hand massage  Hold (Positioning): No assistance needed to correctly position infant at breast. Intervention(s): Support Pillows;Position options;Skin to skin;Breastfeeding basics reviewed  LATCH Score: 9  Lactation Tools Discussed/Used Tools: Comfort gels   Consult Status Consult Status: Follow-up Date: 10/14/14 Follow-up type: In-patient    Theodoro Kalata 10/14/2014, 5:44 AM

## 2014-10-14 NOTE — Lactation Note (Signed)
This note was copied from the chart of Sydney Rios. Lactation Consultation Note  Mother states bf is not going well due to soreness.  Left nipple abrasion, right nipple skin peeling. Mother has comfort gels and reviewed applying ebm. Baby has a distinct upper frenulum.  Possible tight posterior frenulum.  Baby has narrow palate. Mother latched baby in football position.  Rhythmical sucks and swallows heard across the room. Provided mother with rental breast pump until insurance pump arrives. Reviewed engorgement care.   Made outpatient appt for Wed. 10/21 4pm.  Patient Name: Sydney Rios [id number] Date: 10/14/2014 Reason for consult: Follow-up assessment   Maternal Data    Feeding Feeding Type: Formula  LATCH Score/Interventions                      Lactation Tools Discussed/Used     Consult Status Consult Status: Complete    Carlye Grippe 10/14/2014, 11:10 AM

## 2014-10-16 ENCOUNTER — Ambulatory Visit (HOSPITAL_COMMUNITY)
Admit: 2014-10-16 | Discharge: 2014-10-16 | Disposition: A | Discharge status: Home / Self Care | Payer: 59 | Attending: Obstetrics and Gynecology | Admitting: Obstetrics and Gynecology

## 2014-10-16 NOTE — Lactation Note (Signed)
Lactation Consult  Mother's reason for visit:  Poor Latch, no weight gain since discharge from hospital. Left nipple soreness Visit Type:  Outpatient, feeding assessment. Appointment Notes:  Mom here for feeding assessment. Baby is not sustaining the latch consistently at the breast. Some feedings will be as short as 3 minutes, some feedings longer up to 30 minutes. Mom reports baby is sleepy at the breast and is having trouble obtaining good depth. She is concerned baby is not transferring milk well. LC noted while inpatient that baby had upper labial short, thick frenulum. Mom reports she had to have her labial frenulum revised and that her 2nd child had problems with reflux until they had her upper labial frenulum revised then she BF for 13 months.  Consult:  Initial Lactation Consultant:  Katrine Coho  ________________________________________________________________________  37 Name: Carleene Mains  Date of Birth: 10/11/2013  Pediatrician: Dr. Wilfred Lacy, [facility]  Gender: female  Gestational Age: [redacted]w[redacted]d (At Birth)  Birth Weight: 9 lb 5.9 oz (4250 g)  Weight at Discharge: Weight: 8 lb 7.1 oz (3830 g) Date of Discharge: 10/14/2014  Filed Weights   10/11/14 2347 10/13/14 0026 10/13/14 2301  Weight: 9 lb 3.3 oz (4175 g) 8 lb 9.9 oz (3909 g) 8 lb 7.1 oz (3830 g)  Last weight taken from location outside of [facility]: 8 lb. 7.5 oz on Tuesday, 10/15/14  Location:Pediatrician's office  Weight today: 8 lb. 9.6 oz/3900 gm with clean diaper. Baby now 1 days old.    ________________________________________________________________________  Mother's Name: LOYOLA SANTINO Type of delivery:  C/S Breastfeeding Experience:  1 child Mom BF for 32 weeks, 2nd child for 13 months Maternal Medical Conditions:  kidney stones   ________________________________________________________________________  Breastfeeding History (Post Discharge)  Frequency of breastfeeding:  On demand,  every 1-3 hours Duration of feeding:  Varies, sometimes 3 minutes, sometimes up to 30 minutes  Supplementation  Formula:  Volume  5-8 ml Frequency:  1 Total volume per day:  5-8 ml       Brand: Similac  Breastmilk:  Volume 0 ml Frequency:   0 Total volume per day:  N/A   Mom just started pumping last night, she has pumped 2 times since d/c receiving 2-3 oz from each side. Mom has Symphony pump she rented.   Method:  Bottle  Infant Intake and Output Assessment  Voids:  7 in 24 hrs.  Color:  Clear yellow Stools:  2 in 24 hrs.  Color:  Yellow  ________________________________________________________________________  Maternal Breast Assessment  Breast:  Soft, breast milk dripping from breast with feeding baby. Mom does report it is taking longer this time for her milk to come in.  Nipple:  Erect, large in size Pain level:  0, except with football hold on left breast. Some scabbing noted on left nipple, no new breakdown. Mom has comfort gels and applying EBM.  Pain interventions:  N/A  _______________________________________________________________________ Feeding Assessment/Evaluation  Initial feeding assessment:  Infant's oral assessment:  Variance, thick, short upper labial frenulum.   Positioning:  Technical brewer and football Left breast  LATCH documentation:  Latch:  2 = Grasps breast easily, tongue down, lips flanged, rhythmical sucking.  Audible swallowing:  1 = A few with stimulation  Type of nipple:  2 = Everted at rest and after stimulation  Comfort (Breast/Nipple):  2 = Soft / non-tender, leaking breast milk  Hold (Positioning):  1 = Assistance needed to correctly position infant at breast and maintain latch  LATCH  score:  8  Attached assessment:  Deep with sandwiching nipple/aerola and bringing bottom lip down, however baby will slip and obtain shallow latch during the feeding.  Lips flanged:  No. Upper/lower lip will flange but need to readjusted with the feeding.    Lips untucked:  Yes.  With assist   Suck assessment:  Displays both  Tools:  N/A Instructed on use and cleaning of tool:  N/A  Pre-feed weight:  3900g  (8 lb. 9.6 oz.) Post-feed weight:  3932 g (8 lb. 10.7 oz.) Amount transferred:  32 ml Baby nursed for approx 20 minutes off and on, lots of stimulation to keep baby actively nursing. Baby chews at the breast with initial latch, some adjustment to obtain more depth helps. With milk ejection baby demonstrates a good rhythmic suck, swallows noted, but at times will become overwhelmed and pull at breast. For baby to transfer this 32 ml required latching and re-latching baby to keep awake and stimulated at the breast.  Amount supplemented:  0 ml  Additional Feeding Assessment -   Infant's oral assessment:  Variance  Positioning:  Cross Cradle Right breast  LATCH documentation:  Latch:  2 = Grasps breast easily, tongue down, lips flanged, rhythmical sucking.  Audible swallowing:  1 = A few with stimulation  Type of nipple:  2 = Everted at rest and after stimulation  Comfort (Breast/Nipple):  2 = Soft / non-tender  Hold (Positioning):  1 = Assistance needed to correctly position infant at breast and maintain latch  LATCH score:  8  Attached assessment:  Deep  Lips flanged:  No.  Lips untucked:  Yes   Suck assessment:  Displays both  Tools:  N/A Instructed on use and cleaning of tool:  N/A  Pre-feed weight:  3932 g  (8 lb. 10.7 oz.) Post-feed weight: 3952 g (8 lb. 11.4 oz.) Amount transferred:  20 ml Again this required latching and relatching to transfer this 20 ml. Baby sleepy, nursed off/on for approx 15 minutes.  Amount supplemented:  0 ml   Total amount pumped post feed:  N/A  Total amount transferred:  52 ml Total supplement given:  0 ml  Plan discussed with parents:  BF every 2-3 hours or on demand if baby wants to BF sooner. Pre-pump thru 1st milk ejection to slow the flow of milk and remove lower fat milk to help  with weight gain. Try to keep baby nursing for 15-20 minutes each breast, longer if she will stay awake. Give her back the pre-pumped milk. Mom wants to try foley cup, demonstrated how to use, baby took few drops but sleepy. If Mom decides to use bottle advised Dr. Rosary Lively #1 If Baby not staying awake at breast or demonstrating a nutritive suckling pattern, post pump and give her 20-30 ml of EBM to help with weight gain. More is she is not satisfied.  Post pump 4 times/day to increase and protect milk supply.  Offered OP f/u for weight check next week. Mom advised they are going to Peds on Sunday, she will call after this appointment if needed.

## 2014-10-18 ENCOUNTER — Encounter (HOSPITAL_COMMUNITY): Payer: Self-pay | Admitting: *Deleted

## 2014-10-18 ENCOUNTER — Inpatient Hospital Stay (HOSPITAL_COMMUNITY): Admission: RE | Admit: 2014-10-18 | Payer: 59 | Source: Ambulatory Visit | Admitting: Obstetrics and Gynecology

## 2014-10-18 ENCOUNTER — Inpatient Hospital Stay (HOSPITAL_COMMUNITY)
Admission: AD | Admit: 2014-10-18 | Discharge: 2014-10-18 | Disposition: A | Discharge status: Home / Self Care | Payer: 59 | Source: Ambulatory Visit | Attending: Obstetrics and Gynecology | Admitting: Obstetrics and Gynecology

## 2014-10-18 DIAGNOSIS — O9279 Other disorders of lactation: Secondary | ICD-10-CM | POA: Insufficient documentation

## 2014-10-18 DIAGNOSIS — N6459 Other signs and symptoms in breast: Secondary | ICD-10-CM

## 2014-10-18 DIAGNOSIS — O864 Pyrexia of unknown origin following delivery: Secondary | ICD-10-CM | POA: Diagnosis present

## 2014-10-18 LAB — URINALYSIS, ROUTINE W REFLEX MICROSCOPIC
Bilirubin Urine: NEGATIVE
Glucose, UA: NEGATIVE mg/dL
Ketones, ur: NEGATIVE mg/dL
LEUKOCYTES UA: NEGATIVE
Nitrite: NEGATIVE
PROTEIN: NEGATIVE mg/dL
Specific Gravity, Urine: 1.01 (ref 1.005–1.030)
UROBILINOGEN UA: 0.2 mg/dL (ref 0.0–1.0)
pH: 6.5 (ref 5.0–8.0)

## 2014-10-18 LAB — CBC WITH DIFFERENTIAL/PLATELET
BASOS ABS: 0 10*3/uL (ref 0.0–0.1)
Basophils Relative: 0 % (ref 0–1)
EOS PCT: 0 % (ref 0–5)
Eosinophils Absolute: 0 10*3/uL (ref 0.0–0.7)
HCT: 34.9 % — ABNORMAL LOW (ref 36.0–46.0)
Hemoglobin: 11.4 g/dL — ABNORMAL LOW (ref 12.0–15.0)
Lymphocytes Relative: 29 % (ref 12–46)
Lymphs Abs: 1.5 10*3/uL (ref 0.7–4.0)
MCH: 27.3 pg (ref 26.0–34.0)
MCHC: 32.7 g/dL (ref 30.0–36.0)
MCV: 83.5 fL (ref 78.0–100.0)
Monocytes Absolute: 0.4 10*3/uL (ref 0.1–1.0)
Monocytes Relative: 9 % (ref 3–12)
NEUTROS ABS: 3.2 10*3/uL (ref 1.7–7.7)
Neutrophils Relative %: 62 % (ref 43–77)
PLATELETS: 289 10*3/uL (ref 150–400)
RBC: 4.18 MIL/uL (ref 3.87–5.11)
RDW: 14.4 % (ref 11.5–15.5)
WBC: 5.2 10*3/uL (ref 4.0–10.5)

## 2014-10-18 LAB — URINE MICROSCOPIC-ADD ON

## 2014-10-18 SURGERY — Surgical Case
Anesthesia: Regional

## 2014-10-18 MED ORDER — BUTALBITAL-APAP-CAFFEINE 50-325-40 MG PO TABS
1.0000 | ORAL_TABLET | Freq: Once | ORAL | Status: AC
  Administered 2014-10-18: 1 via ORAL
  Filled 2014-10-18: qty 1

## 2014-10-18 NOTE — MAU Provider Note (Signed)
History     CSN: [id number]  Arrival date and time: 10/18/14 9024   First Provider Initiated Contact with Patient 10/18/14 [phone]      Chief Complaint  Patient presents with  . Fever   HPI [name] is a 33 y.o. G3P3003 POD #7 after repeat c-section who presents to MAU complaining of fever. The patient states that she woke up at 0200 and did not feel well. She took her temperature and noted temperature of 101 F at that time. She states mild occasional dysuria without urinary frequency or urgency. She states mild tenderness of her incision. She denies redness, edema or drainage at this time. She states fullness in both breasts and denies redness, rash or evidence of abscess. She does report headache.   OB History   Grav Para Term Preterm Abortions TAB SAB Ect Mult Living   3 3 3       3       Past Medical History  Diagnosis Date  . PONV (postoperative nausea and vomiting)   . No pertinent past medical history   . Chronic kidney disease     hx kidney stones    Past Surgical History  Procedure Laterality Date  . Cesarean section    . No past surgeries    . Cesarean section  01/23/2013    Procedure: CESAREAN SECTION;  Surgeon: Luz Lex, MD;  Location: [facility];  Service: Obstetrics;  Laterality: N/A;  . Cesarean section N/A 10/11/2014    Procedure: CESAREAN SECTION;  Surgeon: Cyril Mourning, MD;  Location: [facility];  Service: Obstetrics;  Laterality: N/A;    No family history on file.  History  Substance Use Topics  . Smoking status: Never Smoker   . Smokeless tobacco: Never Used  . Alcohol Use: No    Allergies: No Known Allergies  Prescriptions prior to admission  Medication Sig Dispense Refill  . ibuprofen (ADVIL,MOTRIN) 600 MG tablet Take 1 tablet (600 mg total) by mouth every 6 (six) hours as needed for mild pain.  30 tablet  1  . oxyCODONE-acetaminophen (PERCOCET/ROXICET) 5-325 MG per tablet Take 1-2 tablets by mouth every 4 (four) hours as needed  (for pain scale equal to or greater than 7).  30 tablet  0  . Prenatal Vit-Fe Fumarate-FA (PRENATAL MULTIVITAMIN) TABS Take 1 tablet by mouth daily.      . valACYclovir (VALTREX) 500 MG tablet Take 500 mg by mouth 2 (two) times daily.      Marland Kitchen acetaminophen (TYLENOL) 500 MG tablet Take 1,000 mg by mouth every 6 (six) hours as needed for mild pain.        Review of Systems  Constitutional: Positive for fever. Negative for malaise/fatigue.  Gastrointestinal: Positive for abdominal pain.  Genitourinary: Positive for dysuria. Negative for urgency and frequency.   Physical Exam   Blood pressure 125/71, pulse 107, temperature 99.6 F (37.6 C), temperature source Oral, resp. rate 20, unknown if currently breastfeeding.  Physical Exam  Constitutional: She is oriented to person, place, and time. She appears well-developed and well-nourished. No distress.  HENT:  Head: Normocephalic.  Cardiovascular: Normal rate.   Respiratory: Effort normal. Right breast exhibits tenderness (mild). Right breast exhibits no inverted nipple, no mass, no nipple discharge and no skin change. Left breast exhibits tenderness (mild). Left breast exhibits no inverted nipple, no mass, no nipple discharge and no skin change. Breasts are symmetrical.  GI: Soft. She exhibits no distension and no mass. There is tenderness (  mild tenderness to palpation of the lower abdomen). There is no rebound and no guarding.  Genitourinary: There is breast swelling (equal bilaterally) and tenderness. No breast discharge or bleeding.  Neurological: She is alert and oriented to person, place, and time.  Skin: Skin is warm and dry. No erythema.  Psychiatric: She has a normal mood and affect.   Results for orders placed during the hospital encounter of 10/18/14 (from the past 24 hour(s))  URINALYSIS, ROUTINE W REFLEX MICROSCOPIC     Status: Abnormal   Collection Time    10/18/14  3:19 AM      Result Value Ref Range   Color, Urine YELLOW   YELLOW   APPearance CLEAR  CLEAR   Specific Gravity, Urine 1.010  1.005 - 1.030   pH 6.5  5.0 - 8.0   Glucose, UA NEGATIVE  NEGATIVE mg/dL   Hgb urine dipstick LARGE (*) NEGATIVE   Bilirubin Urine NEGATIVE  NEGATIVE   Ketones, ur NEGATIVE  NEGATIVE mg/dL   Protein, ur NEGATIVE  NEGATIVE mg/dL   Urobilinogen, UA 0.2  0.0 - 1.0 mg/dL   Nitrite NEGATIVE  NEGATIVE   Leukocytes, UA NEGATIVE  NEGATIVE  URINE MICROSCOPIC-ADD ON     Status: None   Collection Time    10/18/14  3:19 AM      Result Value Ref Range   Squamous Epithelial / LPF RARE  RARE   WBC, UA 0-2  <3 WBC/hpf   RBC / HPF 11-20  <3 RBC/hpf   Bacteria, UA RARE  RARE  CBC WITH DIFFERENTIAL     Status: Abnormal   Collection Time    10/18/14  4:00 AM      Result Value Ref Range   WBC 5.2  4.0 - 10.5 K/uL   RBC 4.18  3.87 - 5.11 MIL/uL   Hemoglobin 11.4 (*) 12.0 - 15.0 g/dL   HCT 34.9 (*) 36.0 - 46.0 %   MCV 83.5  78.0 - 100.0 fL   MCH 27.3  26.0 - 34.0 pg   MCHC 32.7  30.0 - 36.0 g/dL   RDW 14.4  11.5 - 15.5 %   Platelets 289  150 - 400 K/uL   Neutrophils Relative % 62  43 - 77 %   Neutro Abs 3.2  1.7 - 7.7 K/uL   Lymphocytes Relative 29  12 - 46 %   Lymphs Abs 1.5  0.7 - 4.0 K/uL   Monocytes Relative 9  3 - 12 %   Monocytes Absolute 0.4  0.1 - 1.0 K/uL   Eosinophils Relative 0  0 - 5 %   Eosinophils Absolute 0.0  0.0 - 0.7 K/uL   Basophils Relative 0  0 - 1 %   Basophils Absolute 0.0  0.0 - 0.1 K/uL    MAU Course  Procedures None  MDM UA and CBC today Discussed with Dr. Corinna Capra. Patient is ok for discharge at this time. Follow-up as scheduled or sooner PRN. Continue current pain medications. Assessment and Plan  A: Breast engorgement  P: Discharge home Recommend patient continue Motrin and Percocet PRN for pain/fever Discussed warning signs for mastitis or abscess Patient advised to continue pumping regularly and follow-up with lactation as needed Patient advised to follow-up as [facility] with [facility] Patient may return to MAU as needed or if her condition were to change or worsen  Luvenia Redden, PA-C  10/18/2014, 4:35 AM

## 2014-10-18 NOTE — Discharge Instructions (Signed)
Breastfeeding Deciding to breastfeed is one of the best choices you can make for you and your baby. A change in hormones during pregnancy causes your breast tissue to grow and increases the number and size of your milk ducts. These hormones also allow proteins, sugars, and fats from your blood supply to make breast milk in your milk-producing glands. Hormones prevent breast milk from being released before your baby is born as well as prompt milk flow after birth. Once breastfeeding has begun, thoughts of your baby, as well as his or her sucking or crying, can stimulate the release of milk from your milk-producing glands.  BENEFITS OF BREASTFEEDING For Your Baby  Your first milk (colostrum) helps your baby's digestive system function better.   There are antibodies in your milk that help your baby fight off infections.   Your baby has a lower incidence of asthma, allergies, and sudden infant death syndrome.   The nutrients in breast milk are better for your baby than infant formulas and are designed uniquely for your baby's needs.   Breast milk improves your baby's brain development.   Your baby is less likely to develop other conditions, such as childhood obesity, asthma, or type 2 diabetes mellitus.  For You   Breastfeeding helps to create a very special bond between you and your baby.   Breastfeeding is convenient. Breast milk is always available at the correct temperature and costs nothing.   Breastfeeding helps to burn calories and helps you lose the weight gained during pregnancy.   Breastfeeding makes your uterus contract to its prepregnancy size faster and slows bleeding (lochia) after you give birth.   Breastfeeding helps to lower your risk of developing type 2 diabetes mellitus, osteoporosis, and breast or ovarian cancer later in life. SIGNS THAT YOUR BABY IS HUNGRY Early Signs of Hunger  Increased alertness or activity.  Stretching.  Movement of the head from  side to side.  Movement of the head and opening of the mouth when the corner of the mouth or cheek is stroked (rooting).  Increased sucking sounds, smacking lips, cooing, sighing, or squeaking.  Hand-to-mouth movements.  Increased sucking of fingers or hands. Late Signs of Hunger  Fussing.  Intermittent crying. Extreme Signs of Hunger Signs of extreme hunger will require calming and consoling before your baby will be able to breastfeed successfully. Do not wait for the following signs of extreme hunger to occur before you initiate breastfeeding:   Restlessness.  A loud, strong cry.   Screaming. BREASTFEEDING BASICS Breastfeeding Initiation  Find a comfortable place to sit or lie down, with your neck and back well supported.  Place a pillow or rolled up blanket under your baby to bring him or her to the level of your breast (if you are seated). Nursing pillows are specially designed to help support your arms and your baby while you breastfeed.  Make sure that your baby's abdomen is facing your abdomen.   Gently massage your breast. With your fingertips, massage from your chest wall toward your nipple in a circular motion. This encourages milk flow. You may need to continue this action during the feeding if your milk flows slowly.  Support your breast with 4 fingers underneath and your thumb above your nipple. Make sure your fingers are well away from your nipple and your baby's mouth.   Stroke your baby's lips gently with your finger or nipple.   When your baby's mouth is open wide enough, quickly bring your baby to your   breast, placing your entire nipple and as much of the colored area around your nipple (areola) as possible into your baby's mouth.   More areola should be visible above your baby's upper lip than below the lower lip.   Your baby's tongue should be between his or her lower gum and your breast.   Ensure that your baby's mouth is correctly positioned  around your nipple (latched). Your baby's lips should create a seal on your breast and be turned out (everted).  It is common for your baby to suck about 2-3 minutes in order to start the flow of breast milk. Latching Teaching your baby how to latch on to your breast properly is very important. An improper latch can cause nipple pain and decreased milk supply for you and poor weight gain in your baby. Also, if your baby is not latched onto your nipple properly, he or she may swallow some air during feeding. This can make your baby fussy. Burping your baby when you switch breasts during the feeding can help to get rid of the air. However, teaching your baby to latch on properly is still the best way to prevent fussiness from swallowing air while breastfeeding. Signs that your baby has successfully latched on to your nipple:    Silent tugging or silent sucking, without causing you pain.   Swallowing heard between every 3-4 sucks.    Muscle movement above and in front of his or her ears while sucking.  Signs that your baby has not successfully latched on to nipple:   Sucking sounds or smacking sounds from your baby while breastfeeding.  Nipple pain. If you think your baby has not latched on correctly, slip your finger into the corner of your baby's mouth to break the suction and place it between your baby's gums. Attempt breastfeeding initiation again. Signs of Successful Breastfeeding Signs from your baby:   A gradual decrease in the number of sucks or complete cessation of sucking.   Falling asleep.   Relaxation of his or her body.   Retention of a small amount of milk in his or her mouth.   Letting go of your breast by himself or herself. Signs from you:  Breasts that have increased in firmness, weight, and size 1-3 hours after feeding.   Breasts that are softer immediately after breastfeeding.  Increased milk volume, as well as a change in milk consistency and color by  the fifth day of breastfeeding.   Nipples that are not sore, cracked, or bleeding. Signs That Your Baby is Getting Enough Milk  Wetting at least 3 diapers in a 24-hour period. The urine should be clear and pale yellow by age 5 days.  At least 3 stools in a 24-hour period by age 5 days. The stool should be soft and yellow.  At least 3 stools in a 24-hour period by age 7 days. The stool should be seedy and yellow.  No loss of weight greater than 10% of birth weight during the first 3 days of age.  Average weight gain of 4-7 ounces (113-198 g) per week after age 4 days.  Consistent daily weight gain by age 5 days, without weight loss after the age of 2 weeks. After a feeding, your baby may spit up a small amount. This is common. BREASTFEEDING FREQUENCY AND DURATION Frequent feeding will help you make more milk and can prevent sore nipples and breast engorgement. Breastfeed when you feel the need to reduce the fullness of your breasts   or when your baby shows signs of hunger. This is called "breastfeeding on demand." Avoid introducing a pacifier to your baby while you are working to establish breastfeeding (the first 4-6 weeks after your baby is born). After this time you may choose to use a pacifier. Research has shown that pacifier use during the first year of a baby's life decreases the risk of sudden infant death syndrome (SIDS). Allow your baby to feed on each breast as long as he or she wants. Breastfeed until your baby is finished feeding. When your baby unlatches or falls asleep while feeding from the first breast, offer the second breast. Because newborns are often sleepy in the first few weeks of life, you may need to awaken your baby to get him or her to feed. Breastfeeding times will vary from baby to baby. However, the following rules can serve as a guide to help you ensure that your baby is properly fed:  Newborns (babies 4 weeks of age or younger) may breastfeed every 1-3  hours.  Newborns should not go longer than 3 hours during the day or 5 hours during the night without breastfeeding.  You should breastfeed your baby a minimum of 8 times in a 24-hour period until you begin to introduce solid foods to your baby at around 6 months of age. BREAST MILK PUMPING Pumping and storing breast milk allows you to ensure that your baby is exclusively fed your breast milk, even at times when you are unable to breastfeed. This is especially important if you are going back to work while you are still breastfeeding or when you are not able to be present during feedings. Your lactation consultant can give you guidelines on how long it is safe to store breast milk.  A breast pump is a machine that allows you to pump milk from your breast into a sterile bottle. The pumped breast milk can then be stored in a refrigerator or freezer. Some breast pumps are operated by hand, while others use electricity. Ask your lactation consultant which type will work best for you. Breast pumps can be purchased, but some hospitals and breastfeeding support groups lease breast pumps on a monthly basis. A lactation consultant can teach you how to hand express breast milk, if you prefer not to use a pump.  CARING FOR YOUR BREASTS WHILE YOU BREASTFEED Nipples can become dry, cracked, and sore while breastfeeding. The following recommendations can help keep your breasts moisturized and healthy:  Avoid using soap on your nipples.   Wear a supportive bra. Although not required, special nursing bras and tank tops are designed to allow access to your breasts for breastfeeding without taking off your entire bra or top. Avoid wearing underwire-style bras or extremely tight bras.  Air dry your nipples for 3-4minutes after each feeding.   Use only cotton bra pads to absorb leaked breast milk. Leaking of breast milk between feedings is normal.   Use lanolin on your nipples after breastfeeding. Lanolin helps to  maintain your skin's normal moisture barrier. If you use pure lanolin, you do not need to wash it off before feeding your baby again. Pure lanolin is not toxic to your baby. You may also hand express a few drops of breast milk and gently massage that milk into your nipples and allow the milk to air dry. In the first few weeks after giving birth, some women experience extremely full breasts (engorgement). Engorgement can make your breasts feel heavy, warm, and tender to the   touch. Engorgement peaks within 3-5 days after you give birth. The following recommendations can help ease engorgement:  Completely empty your breasts while breastfeeding or pumping. You may want to start by applying warm, moist heat (in the shower or with warm water-soaked hand towels) just before feeding or pumping. This increases circulation and helps the milk flow. If your baby does not completely empty your breasts while breastfeeding, pump any extra milk after he or she is finished.  Wear a snug bra (nursing or regular) or tank top for 1-2 days to signal your body to slightly decrease milk production.  Apply ice packs to your breasts, unless this is too uncomfortable for you.  Make sure that your baby is latched on and positioned properly while breastfeeding. If engorgement persists after 48 hours of following these recommendations, contact your health care provider or a lactation consultant. OVERALL HEALTH CARE RECOMMENDATIONS WHILE BREASTFEEDING  Eat healthy foods. Alternate between meals and snacks, eating 3 of each per day. Because what you eat affects your breast milk, some of the foods may make your baby more irritable than usual. Avoid eating these foods if you are sure that they are negatively affecting your baby.  Drink milk, fruit juice, and water to satisfy your thirst (about 10 glasses a day).   Rest often, relax, and continue to take your prenatal vitamins to prevent fatigue, stress, and anemia.  Continue  breast self-awareness checks.  Avoid chewing and smoking tobacco.  Avoid alcohol and drug use. Some medicines that may be harmful to your baby can pass through breast milk. It is important to ask your health care provider before taking any medicine, including all over-the-counter and prescription medicine as well as vitamin and herbal supplements. It is possible to become pregnant while breastfeeding. If birth control is desired, ask your health care provider about options that will be safe for your baby. SEEK MEDICAL CARE IF:   You feel like you want to stop breastfeeding or have become frustrated with breastfeeding.  You have painful breasts or nipples.  Your nipples are cracked or bleeding.  Your breasts are red, tender, or warm.  You have a swollen area on either breast.  You have a fever or chills.  You have nausea or vomiting.  You have drainage other than breast milk from your nipples.  Your breasts do not become full before feedings by the fifth day after you give birth.  You feel sad and depressed.  Your baby is too sleepy to eat well.  Your baby is having trouble sleeping.   Your baby is wetting less than 3 diapers in a 24-hour period.  Your baby has less than 3 stools in a 24-hour period.  Your baby's skin or the white part of his or her eyes becomes yellow.   Your baby is not gaining weight by 5 days of age. SEEK IMMEDIATE MEDICAL CARE IF:   Your baby is overly tired (lethargic) and does not want to wake up and feed.  Your baby develops an unexplained fever. Document Released: 12/13/2005 Document Revised: 12/18/2013 Document Reviewed: 06/06/2013 Exit[employer] Patient Information 2015 [employer]. This information is not intended to replace advice given to you by your health care provider. Make sure you discuss any questions you have with your health care provider.  

## 2014-10-18 NOTE — MAU Note (Signed)
Pt reports a temp of 101 at 0215 this am. Denies any other specific symptoms

## 2014-10-18 NOTE — MAU Note (Addendum)
PT  SAYS SHE  DEL BY C/S ON Friday  .  [place] ON Monday-  ALL OK.       SAYS SHE  AWOKE  AT 0200-  FELT LIKE SHE HAD  FEVER- DID NOT TAKE TEMP.     BREASTFEEDING.  SAYS INCISION IS BURNING.    SAYS BURNS  SOME WHEN VOIDING.      ALSO HAS H/A-    STARTED YESTERDAY-    HAS HX OF H/A.        TOOK IBUPROFEN- AT 3PM.

## 2014-10-28 ENCOUNTER — Encounter (HOSPITAL_COMMUNITY): Payer: Self-pay | Admitting: *Deleted

## 2014-11-28 ENCOUNTER — Other Ambulatory Visit: Payer: Self-pay | Admitting: Urology

## 2014-12-06 ENCOUNTER — Encounter (HOSPITAL_COMMUNITY): Payer: Self-pay | Admitting: *Deleted

## 2014-12-11 NOTE — H&P (Signed)
Active Problems Problems  1. Kidney stone on right side (N20.0) 2. Right flank pain (R10.9)  History of Present Illness Sydney Rios returns today in f/u. She has a history of stones and is now 6 weeks post partum. She has know right renal stones and continues to have right flank pain that has been moderately severe but it is managed with Advil or Tylenol. She has occasional left flank pain as well. She has had no hematuria or voiding symptoms.  She has no associated signs or symptoms.   Past Medical History Problems  1. History of kidney stones (Z87.442) 2. History of Hydronephrosis, right (N13.30)  Surgical History Problems  1. History of Cesarean Section  Current Meds 1. Pre-Natal TABS;  Therapy: (Recorded:27Jul2015) to Recorded  Allergies No Known Allergies  1. No Known Allergies  Family History Problems  1. Family history of Anxiety (Symptom) 2. Family history of Diabetes Mellitus 3. Family history of Essential Hypertension 4. Family history of Familial Hypercholesterolemia 5. Family history of hypertension (Z82.49) : Father 6. Family history of Hematuria 7. Family history of Nephrolithiasis  Social History Problems  1. Alcohol Use 2. Caffeine Use 3. Marital History - Single 4. Married 5. Never a smoker 6. Occupation:   Pharmacist, hospital 7. Denied: History of Tobacco Use 8. Two children  Review of Systems Genitourinary, constitutional, skin, eye, otolaryngeal, hematologic/lymphatic, cardiovascular, pulmonary, endocrine, musculoskeletal, gastrointestinal, neurological and psychiatric system(s) were reviewed and pertinent findings if present are noted and are otherwise negative.  Genitourinary: vaginal discharge (still having some bleeding).  Gastrointestinal: flank pain.    Vitals Vital Signs [Data Includes: Last 1 Day]  Recorded: 02Dec2015 02:35PM  Blood Pressure: 93 / 60 Temperature: 98.4 F Heart Rate: 66  Physical Exam Constitutional: Well nourished and well  developed . No acute distress.  ENT:. The ears and nose are normal in appearance.  Pulmonary: No respiratory distress and normal respiratory rhythm and effort.  Cardiovascular: Heart rate and rhythm are normal . No peripheral edema.  Abdomen: The abdomen is soft and nontender. No CVA tenderness. No hernias are palpable.    Results/Data Urine [Data Includes: Last 1 Day]   [id number]  COLOR YELLOW   APPEARANCE CLEAR   SPECIFIC GRAVITY 1.020   pH 5.5   GLUCOSE NEG mg/dL  BILIRUBIN NEG   KETONE NEG mg/dL  BLOOD MOD   PROTEIN NEG mg/dL  UROBILINOGEN 0.2 mg/dL  NITRITE NEG   LEUKOCYTE ESTERASE NEG   SQUAMOUS EPITHELIAL/HPF NONE SEEN   WBC 0-2 WBC/hpf  RBC 3-6 RBC/hpf  BACTERIA RARE   CRYSTALS NONE SEEN   CASTS NONE SEEN    The following images/tracing/specimen were independently visualized:  KUB today shows a 5x 50mm cluster of stones in the RUP that has grown progressively on imaging from 2007, 2008 and 2010. There are about 7 stones in the linear cluster. She has no obvious ureteral stones or left renal stones. She has normal bone structure and gas patterns and normal soft tissue shadows. A renal US today shows a 12.09cm right kidney without mass or hydro. There is a 26 x 4.24mm cluster of stones in the right upper pole consistent with the KUB findings. The right kidney is 12.25cm and there is a 19mm stone in the LMP but there is no hydro and no masses are seen. The bladder volume is 15ml and is normal.  The following clinical lab reports were reviewed:  UA reviewed.    Assessment Assessed  1. Kidney stone on right side (N20.0) 2. Right  flank pain (R10.9) 3. History of Hydronephrosis, right (N13.30)  She has a 25 x 79mm linear cluster of about 7 stones in the RUP without obstruction.  These stones have grown and increased in number over the last 8 years.  It is possible they could be contributing to her pain, but without obstruction it is difficult to say, but they are metabolically  active and as such need to be treated.   Plan Kidney stone on right side  1. Follow-up Schedule Surgery Office  Follow-up  Status: Complete  Done: [id number]  I discussed the options for treatment including ESWL, ureteroscopy and PCNL. She is most interested in ESWL and the risks of bleeding, infection, renal bleeding or injury that can be severe or life threatening, injury to adjacent organs, failure to fragment or obstructing fragments requiring secondary procedures, thrombotic events and anesthetic/sedation risks.    We will try to get this set up before the end of the year.   Discussion/Summary CC: Dr. Harlan Stains.

## 2014-12-12 ENCOUNTER — Encounter (HOSPITAL_COMMUNITY): Payer: Self-pay | Admitting: *Deleted

## 2014-12-12 ENCOUNTER — Ambulatory Visit (HOSPITAL_COMMUNITY)
Admission: RE | Admit: 2014-12-12 | Discharge: 2014-12-12 | Disposition: A | Discharge status: Home / Self Care | Payer: 59 | Source: Ambulatory Visit | Attending: Urology | Admitting: Urology

## 2014-12-12 ENCOUNTER — Encounter (HOSPITAL_COMMUNITY)
Admission: RE | Disposition: A | Discharge status: Home / Self Care | Payer: Self-pay | Source: Ambulatory Visit | Attending: Urology

## 2014-12-12 ENCOUNTER — Ambulatory Visit (HOSPITAL_COMMUNITY): Payer: 59

## 2014-12-12 DIAGNOSIS — N200 Calculus of kidney: Secondary | ICD-10-CM | POA: Diagnosis present

## 2014-12-12 LAB — PREGNANCY, URINE: Preg Test, Ur: NEGATIVE

## 2014-12-12 IMAGING — CR DG ABDOMEN 1V
1 series · 1 of 1 positions shown · non-contrast
Comparison: [DATE] and [DATE].

CLINICAL DATA: Preop for right side Kidney stones

EXAM:
ABDOMEN - 1 VIEW

[t abdomen supine]
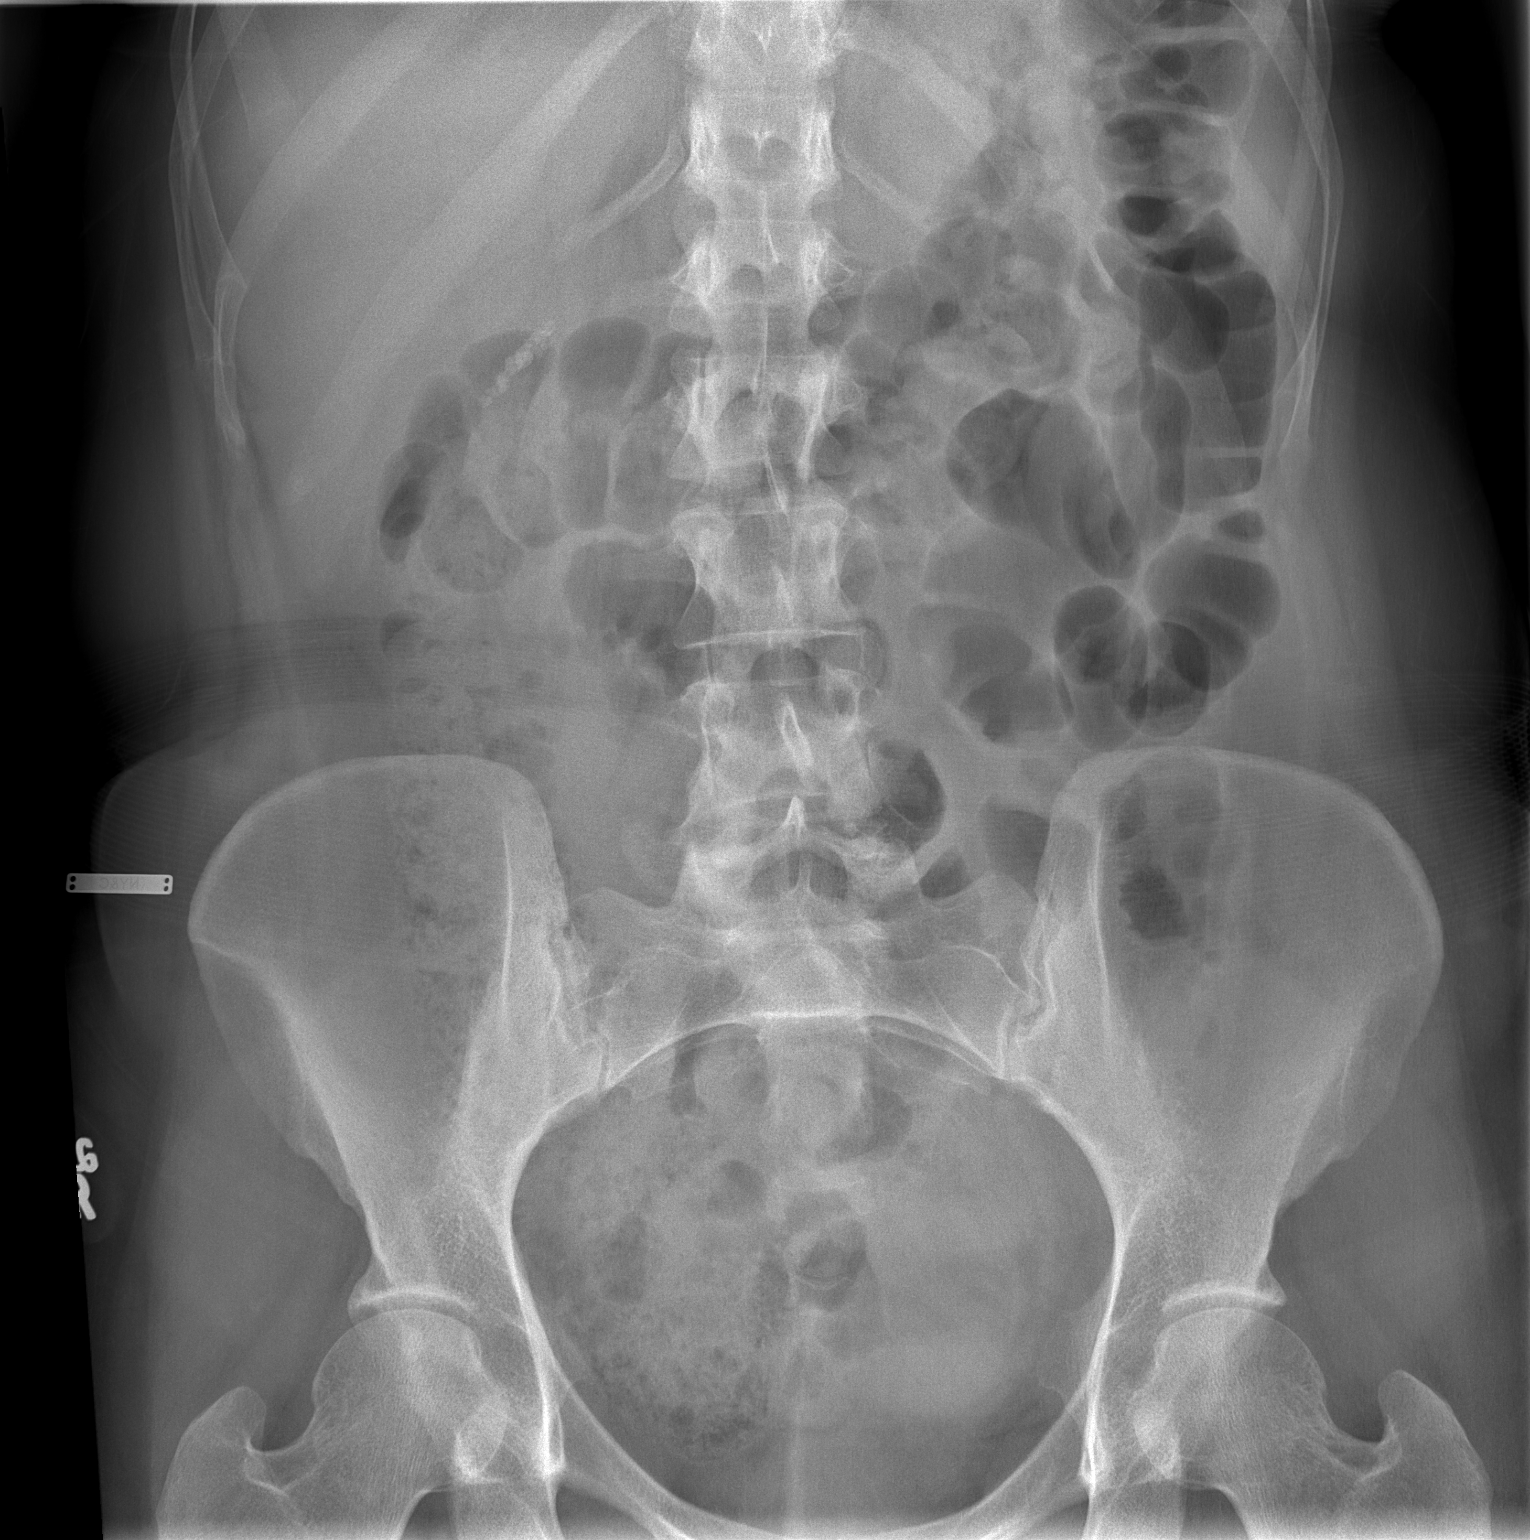

[1 of 1 positions shown; findings below may reference images not displayed]

FINDINGS: There is nonspecific nonobstructive bowel gas pattern. Multiple
calcified renal calculi are noted upper pole region of the right
kidney the largest measures 4.7 mm. The calculi are clustered in a
linear pattern.
IMPRESSION: Multiple calcified calculi are noted upper pole region of the right
kidney the largest measures 4.7 mm.

## 2014-12-12 SURGERY — LITHOTRIPSY, ESWL
Anesthesia: LOCAL | Laterality: Right

## 2014-12-12 MED ORDER — SCOPOLAMINE 1 MG/3DAYS TD PT72: 1.0000 | MEDICATED_PATCH | TRANSDERMAL | Status: DC

## 2014-12-12 MED ORDER — CIPROFLOXACIN HCL 500 MG PO TABS
500.0000 mg | ORAL_TABLET | ORAL | Status: AC
  Administered 2014-12-12: 500 mg via ORAL
  Filled 2014-12-12: qty 1

## 2014-12-12 MED ORDER — DIPHENHYDRAMINE HCL 25 MG PO CAPS
25.0000 mg | ORAL_CAPSULE | ORAL | Status: AC
  Administered 2014-12-12: 25 mg via ORAL
  Filled 2014-12-12: qty 1

## 2014-12-12 MED ORDER — DIAZEPAM 5 MG PO TABS
10.0000 mg | ORAL_TABLET | ORAL | Status: AC
  Administered 2014-12-12: 10 mg via ORAL
  Filled 2014-12-12: qty 2

## 2014-12-12 MED ORDER — DEXTROSE 5 % IV SOLN
INTRAVENOUS | Status: DC
  Administered 2014-12-12: 08:00:00 via INTRAVENOUS
  Filled 2014-12-12: qty 1000

## 2014-12-12 NOTE — Discharge Instructions (Signed)
Lithotripsy for Kidney Stones °Lithotripsy is a treatment that can sometimes help eliminate kidney stones and pain that they cause. A form of lithotripsy, also known as extracorporeal shock wave lithotripsy, is a nonsurgical procedure that helps your body rid itself of the kidney stone when it is too big to pass on its own. Extracorporeal shock wave lithotripsy is a method of crushing a kidney stone with shock waves. These shock waves pass through your body and are focused on your stone. They cause the kidney stones to crumble while still in the urinary tract. It is then easier for the smaller pieces of stone to pass in the urine. °Lithotripsy usually takes about an hour. It is done in a hospital, a lithotripsy center, or a mobile unit. It usually does not require an overnight stay. Your health care provider will instruct you on preparation for the procedure. Your health care provider will tell you what to expect afterward. °LET YOUR HEALTH CARE PROVIDER KNOW ABOUT: °· Any allergies you have. °· All medicines you are taking, including vitamins, herbs, eye drops, creams, and over-the-counter medicines. °· Previous problems you or members of your family have had with the use of anesthetics. °· Any blood disorders you have. °· Previous surgeries you have had. °· Medical conditions you have. °RISKS AND COMPLICATIONS °Generally, lithotripsy for kidney stones is a safe procedure. However, as with any procedure, complications can occur. Possible complications include: °· Infection. °· Bleeding of the kidney. °· Bruising of the kidney or skin. °· Obstruction of the ureter. °· Failure of the stone to fragment. °BEFORE THE PROCEDURE °· Do not eat or drink for 6-8 hours prior to the procedure. You may, however, take the medications with a sip of water that your physician instructs you to take °· Do not take aspirin or aspirin-containing products for 7 days prior to your procedure °· Do not take nonsteroidal anti-inflammatory  products for 7 days prior to your procedure °PROCEDURE °A stent (flexible tube with holes) may be placed in your ureter. The ureter is the tube that transports the urine from the kidneys to the bladder. Your health care provider may place a stent before the procedure. This will help keep urine flowing from the kidney if the fragments of the stone block the ureter. You may have an IV tube placed in one of your veins to give you fluids and medicines. These medicines may help you relax or make you sleep. During the procedure, you will lie comfortably on a fluid-filled cushion or in a warm-water bath. After an X-ray or ultrasound exam to locate your stone, shock waves are aimed at the stone. If you are awake, you may feel a tapping sensation as the shock waves pass through your body. If large stone particles remain after treatment, a second procedure may be necessary at a later date. °For comfort during the test: °· Relax as much as possible. °· Try to remain still as much as possible. °· Try to follow instructions to speed up the test. °· Let your health care provider know if you are uncomfortable, anxious, or in pain. °AFTER THE PROCEDURE  °After surgery, you will be taken to the recovery area. A nurse will watch and check your progress. Once you're awake, stable, and taking fluids well, you will be allowed to go home as long as there are no problems. You will also be allowed to pass your urine before discharge. You may be given antibiotics to help prevent infection. You may also be prescribed   pain medicine if needed. In a week or two, your health care provider may remove your stent, if you have one. You may first have an X-ray exam to check on how successful the fragmentation of your stone has been and how much of the stone has passed. Your health care provider will check to see whether or not stone particles remain. °SEEK IMMEDIATE MEDICAL CARE IF: °· You develop a fever or shaking chills. °· Your pain is not  relieved by medicine. °· You feel sick to your stomach (nauseated) and you vomit. °· You develop heavy bleeding. °· You have difficulty urinating. °· You start to pass your stent from your penis. °Document Released: 12/10/2000 Document Revised: 10/03/2013 Document Reviewed: 06/28/2013 °[employer]® Patient Information ©2015 [employer]. This information is not intended to replace advice given to you by your health care provider. Make sure you discuss any questions you have with your health care provider. ° °

## 2014-12-12 NOTE — Interval H&P Note (Signed)
History and Physical Interval Note:  12/12/2014 8:00 AM  Sydney Rios  has presented today for surgery, with the diagnosis of right renal stone  The various methods of treatment have been discussed with the patient and family. After consideration of risks, benefits and other options for treatment, the patient has consented to  Procedure(s): RIGHT GATED EXTRACORPOREAL SHOCK WAVE LITHOTRIPSY (ESWL) (Right) as a surgical intervention .  The patient's history has been reviewed, patient examined, no change in status, stable for surgery.  I have reviewed the patient's chart and labs.  Questions were answered to the patient's satisfaction.     Nataliyah Packham J

## 2016-08-05 ENCOUNTER — Encounter (HOSPITAL_COMMUNITY): Payer: Self-pay | Admitting: *Deleted

## 2016-08-05 ENCOUNTER — Emergency Department (HOSPITAL_COMMUNITY): Payer: BLUE CROSS/BLUE SHIELD

## 2016-08-05 ENCOUNTER — Emergency Department (HOSPITAL_COMMUNITY)
Admission: EM | Admit: 2016-08-05 | Discharge: 2016-08-06 | Disposition: A | Discharge status: Home / Self Care | Payer: BLUE CROSS/BLUE SHIELD | Attending: Emergency Medicine | Admitting: Emergency Medicine

## 2016-08-05 DIAGNOSIS — Z79891 Long term (current) use of opiate analgesic: Secondary | ICD-10-CM | POA: Insufficient documentation

## 2016-08-05 DIAGNOSIS — Y9302 Activity, running: Secondary | ICD-10-CM | POA: Diagnosis not present

## 2016-08-05 DIAGNOSIS — S199XXA Unspecified injury of neck, initial encounter: Secondary | ICD-10-CM | POA: Diagnosis present

## 2016-08-05 DIAGNOSIS — S161XXA Strain of muscle, fascia and tendon at neck level, initial encounter: Secondary | ICD-10-CM | POA: Insufficient documentation

## 2016-08-05 DIAGNOSIS — Y9289 Other specified places as the place of occurrence of the external cause: Secondary | ICD-10-CM | POA: Diagnosis not present

## 2016-08-05 DIAGNOSIS — Z79899 Other long term (current) drug therapy: Secondary | ICD-10-CM | POA: Insufficient documentation

## 2016-08-05 DIAGNOSIS — X58XXXA Exposure to other specified factors, initial encounter: Secondary | ICD-10-CM | POA: Insufficient documentation

## 2016-08-05 DIAGNOSIS — S39012A Strain of muscle, fascia and tendon of lower back, initial encounter: Secondary | ICD-10-CM | POA: Insufficient documentation

## 2016-08-05 DIAGNOSIS — Y999 Unspecified external cause status: Secondary | ICD-10-CM | POA: Insufficient documentation

## 2016-08-05 MED ORDER — IBUPROFEN 200 MG PO TABS: 600.0000 mg | ORAL_TABLET | Freq: Once | ORAL | Status: DC

## 2016-08-05 NOTE — ED Provider Notes (Signed)
MSE was initiated and I personally evaluated the patient and placed orders (if any) at  11:52 PM on August 05, 2016.  The patient appears stable so that the remainder of the MSE may be completed by another provider.  Hyperextension injury with neck and low back pain. Ct c spine. L spine plain film. Ibuprofen for pain   Jola Schmidt, MD 08/05/16 [phone]

## 2016-08-05 NOTE — ED Triage Notes (Signed)
Pt states that she ran into a playground pole and snapped her neck back; pt states "I heard a crack"; pt states that she has felt nauseous since the incident with no vomiting; pt c/.o pain radiating from neck down to lower back; pt denies numbness or tingling; pt with tenderness to C-Spine upon palpation; C- Collar placed on pt

## 2016-08-06 ENCOUNTER — Emergency Department (HOSPITAL_COMMUNITY): Payer: BLUE CROSS/BLUE SHIELD

## 2016-08-06 LAB — I-STAT BETA HCG BLOOD, ED (MC, WL, AP ONLY)

## 2016-08-06 IMAGING — CR DG LUMBAR SPINE COMPLETE 4+V
5 series · 5 of 5 positions shown · non-contrast
Comparison: [DATE] radiographs

CLINICAL DATA: Acute low back pain following injury today. Initial
encounter.

EXAM:
LUMBAR SPINE - COMPLETE 4+ VIEW

[t lumbar spine ap]
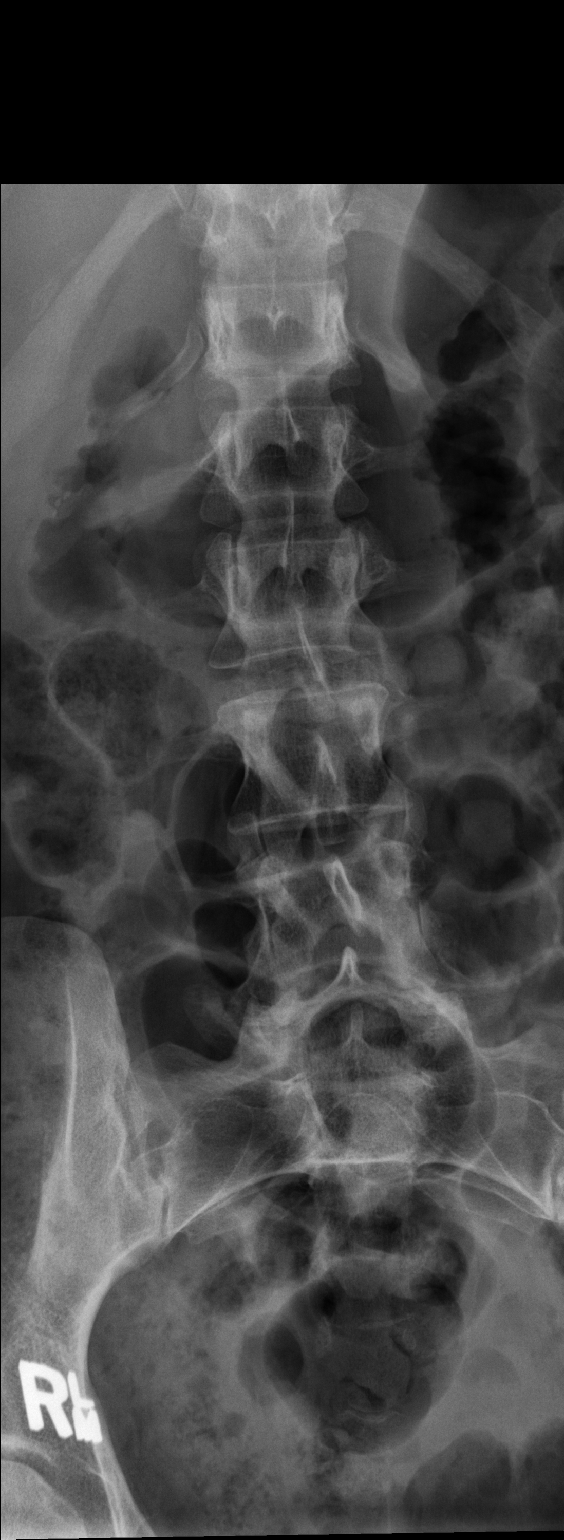

[t lumbar spine obl (1 of 2)]
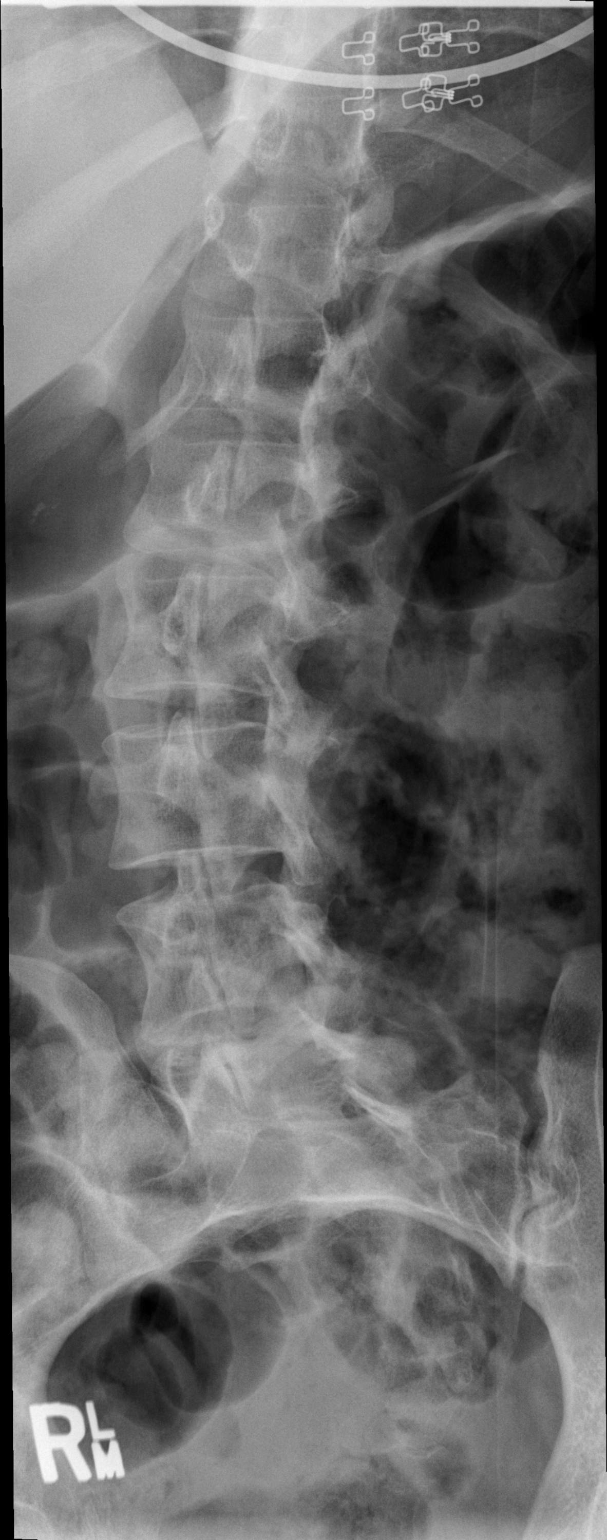

[t lumbar spine obl (2 of 2)]
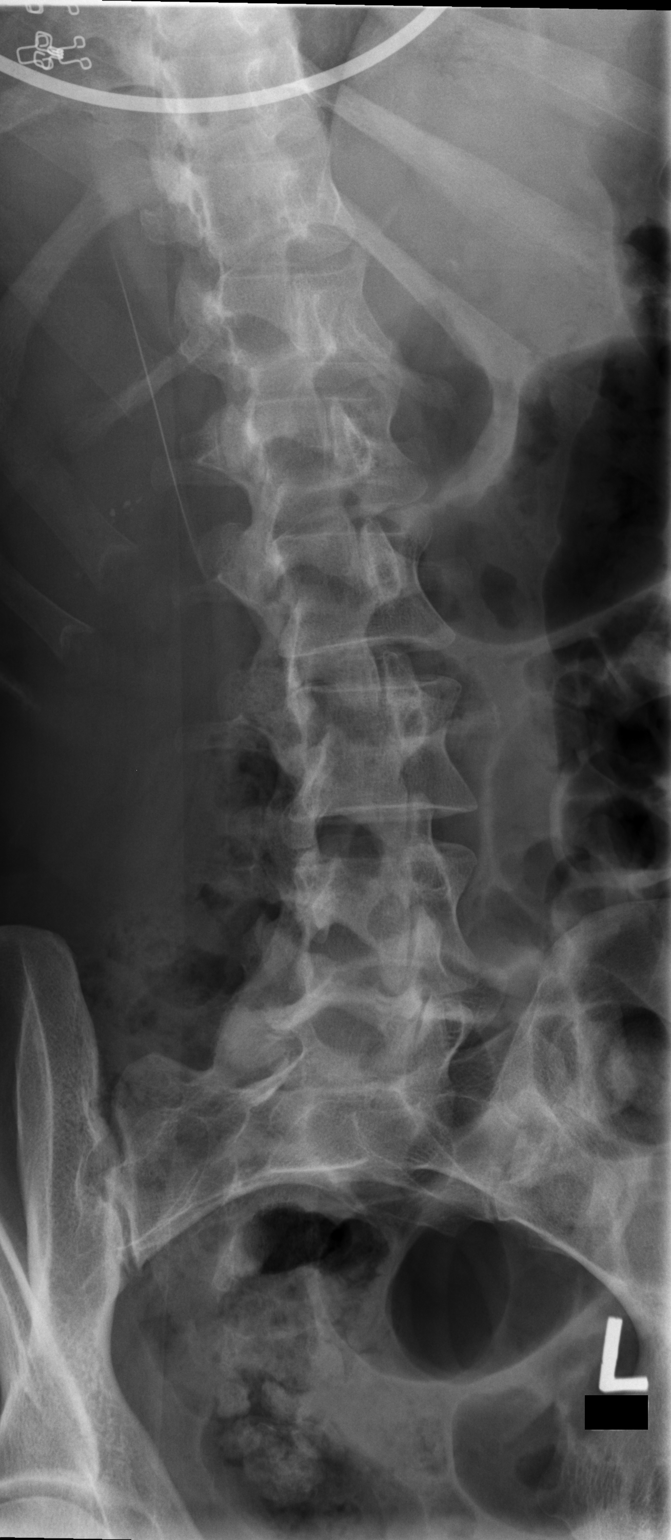

[t lumbar spine lat]
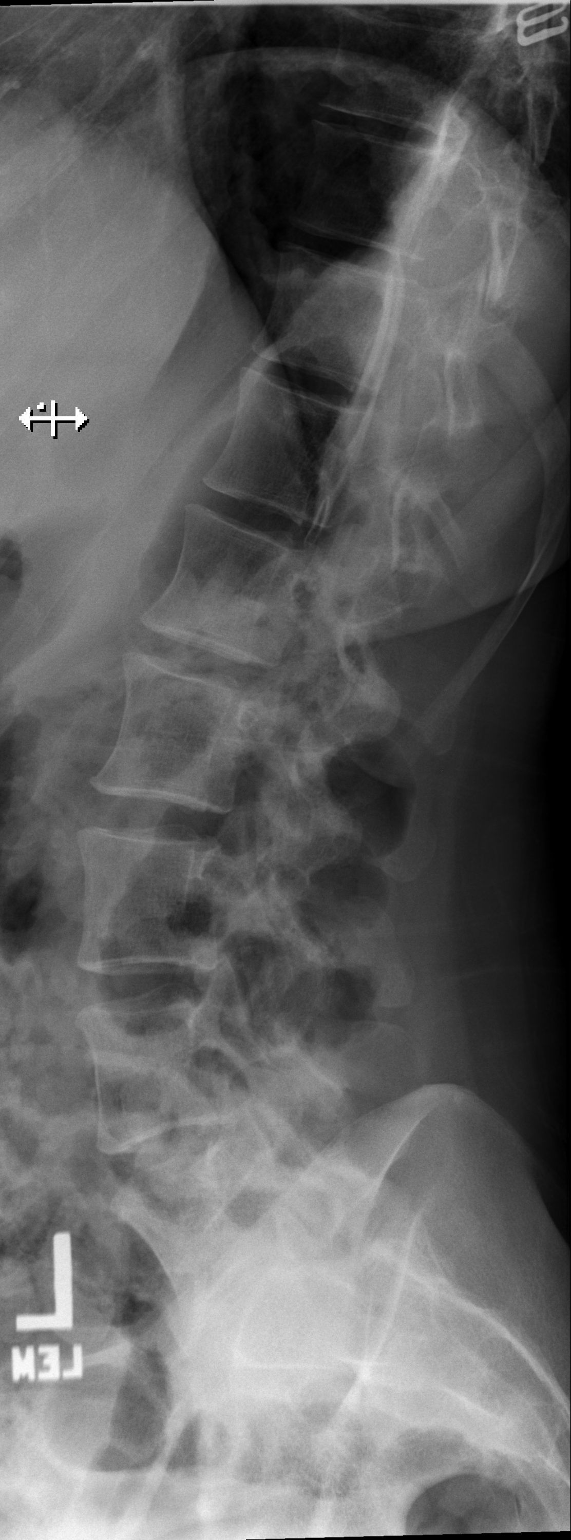

[t lumbar l-5 s-1 spot]
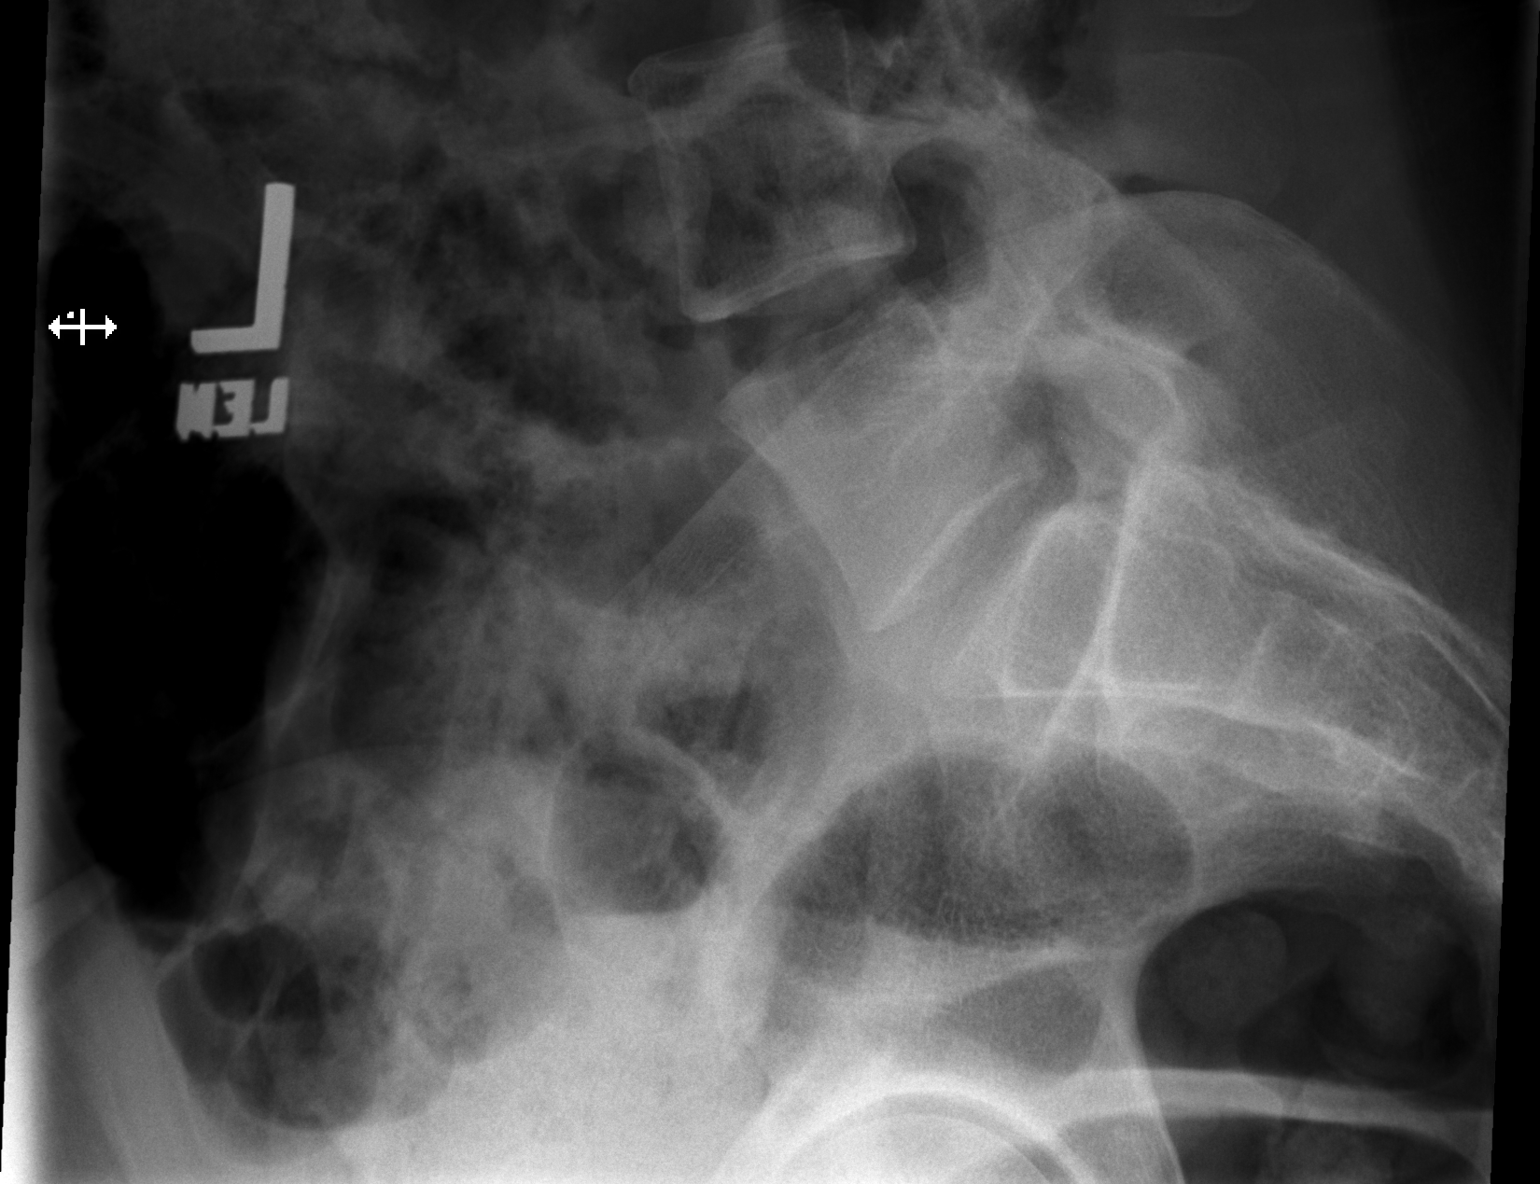

[5 of 5 positions shown; findings below may reference images not displayed]

FINDINGS: There is no evidence of lumbar spine fracture. Alignment is normal.
Intervertebral disc spaces are maintained.
IMPRESSION: Negative.

## 2016-08-06 IMAGING — CT CT CERVICAL SPINE W/O CM
3 of 4 series · 9 of 33 positions shown, 11 images · non-contrast
Comparison: None.

CLINICAL DATA: Injury to neck by running into playground pole.
Nausea and posterior neck pain. Initial encounter.

EXAM:
CT CERVICAL SPINE WITHOUT CONTRAST
TECHNIQUE: Multidetector CT imaging of the cervical spine was performed without
intravenous contrast. Multiplanar CT image reconstructions were also
generated.

[Series 3: c-spine st · axial · 0.31mm/px · z∈[+1340,+1340]mm · 1 of 99 slices shown, 2 images]
[im 50/99  soft-tissue]
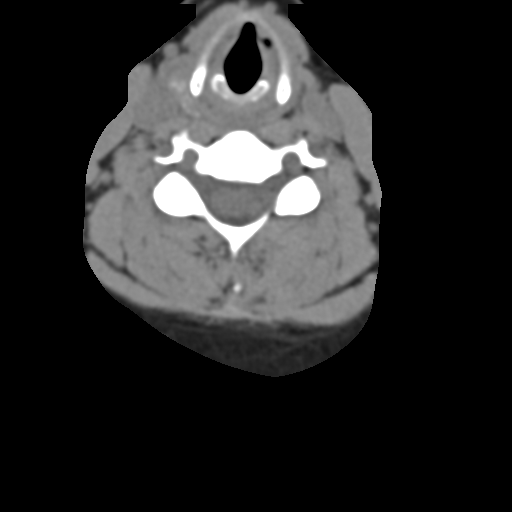
[im 50/99  bone]
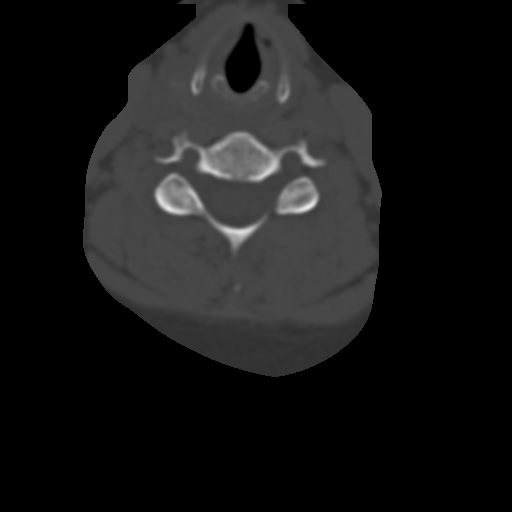

[Series 6: coronal recons · coronal · 0.19mm/px · 3 of 41 slices shown]
[im 9/41  bone]
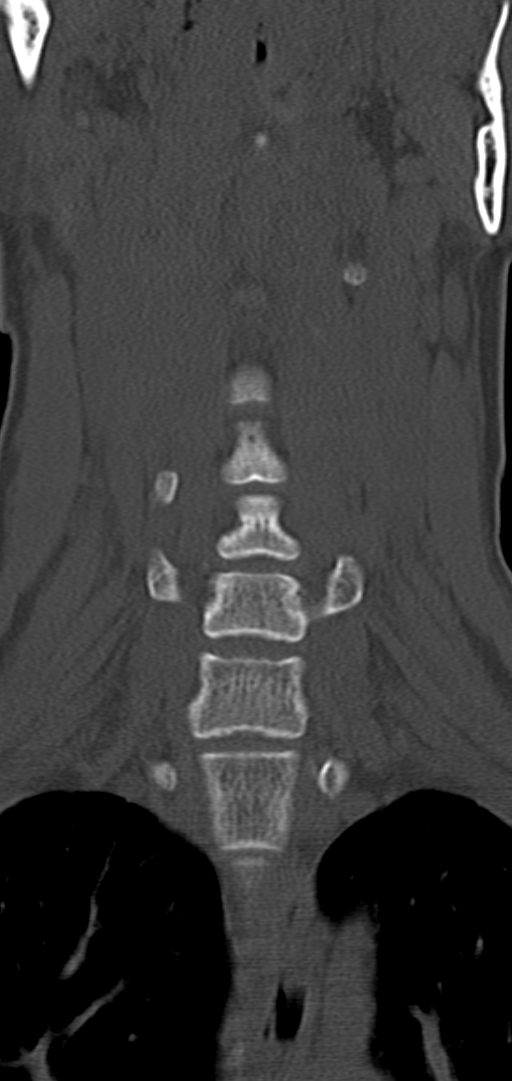
[im 17/41  bone]
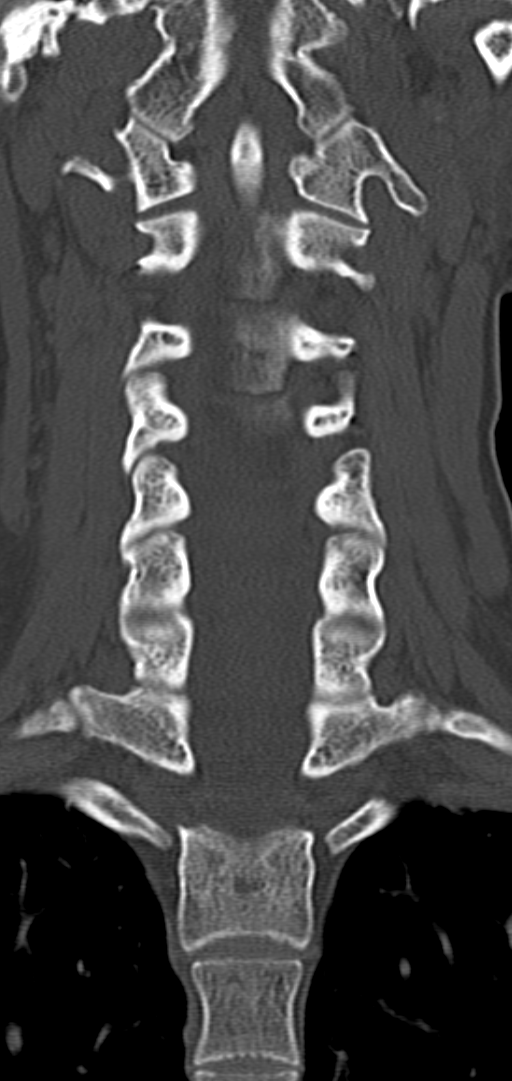
[im 25/41  bone]
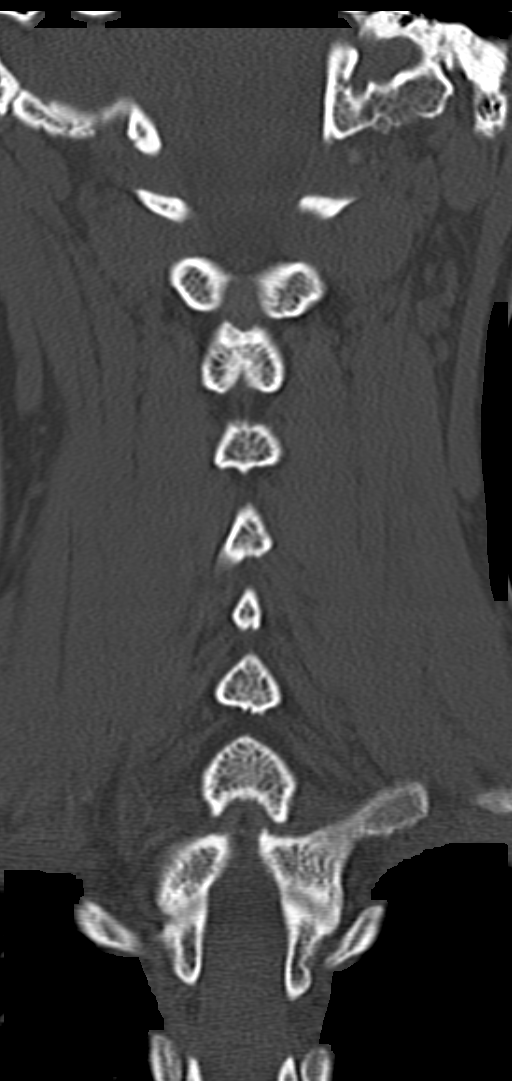

[Series 7: sagittal recons · sagittal · 0.19mm/px · 5 of 42 slices shown, 6 images]
[im 14/42  bone]
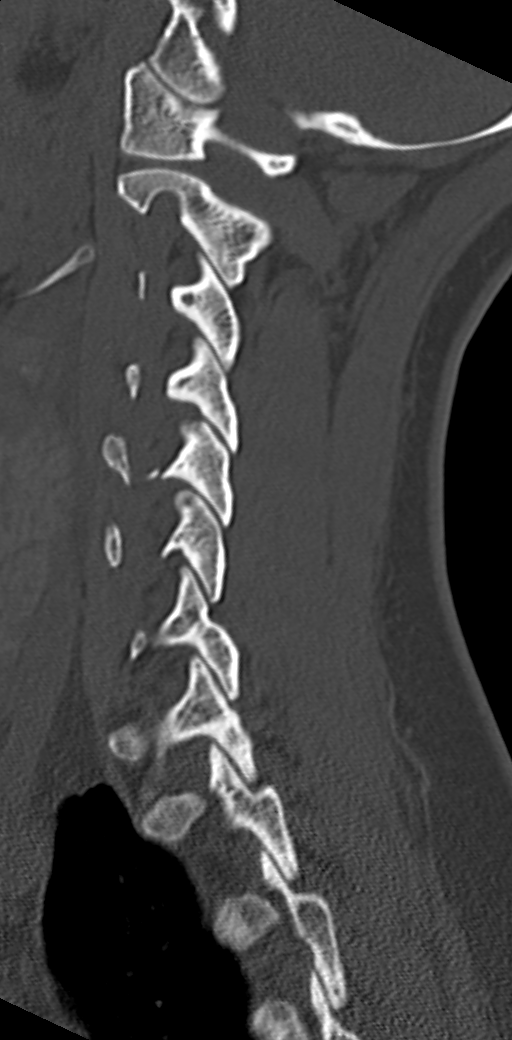
[im 18/42  bone]
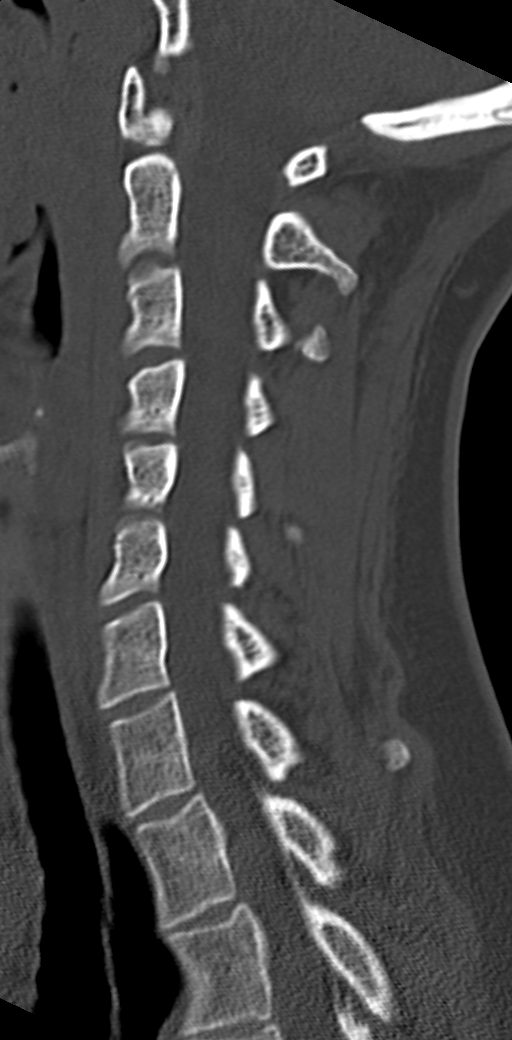
[im 21/42  soft-tissue]
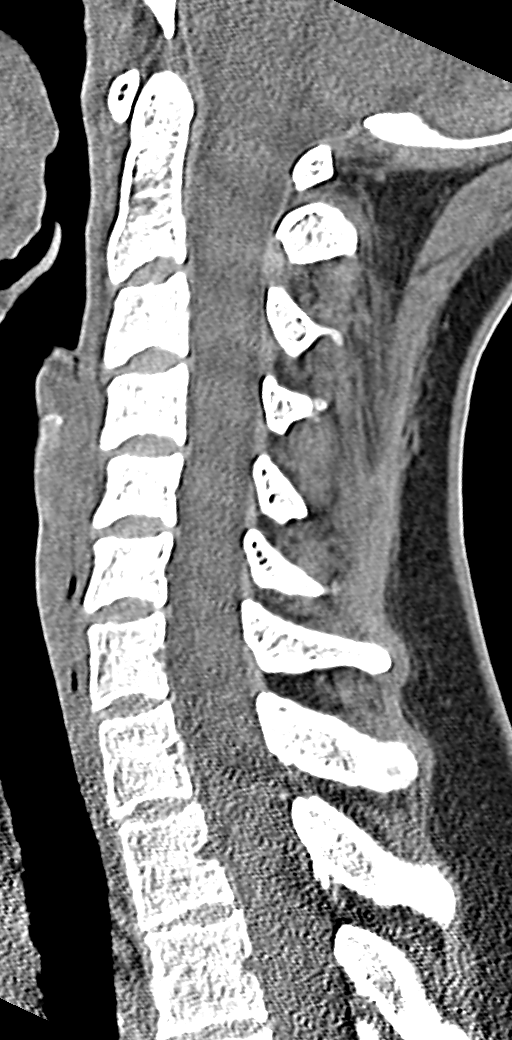
[im 21/42  bone]
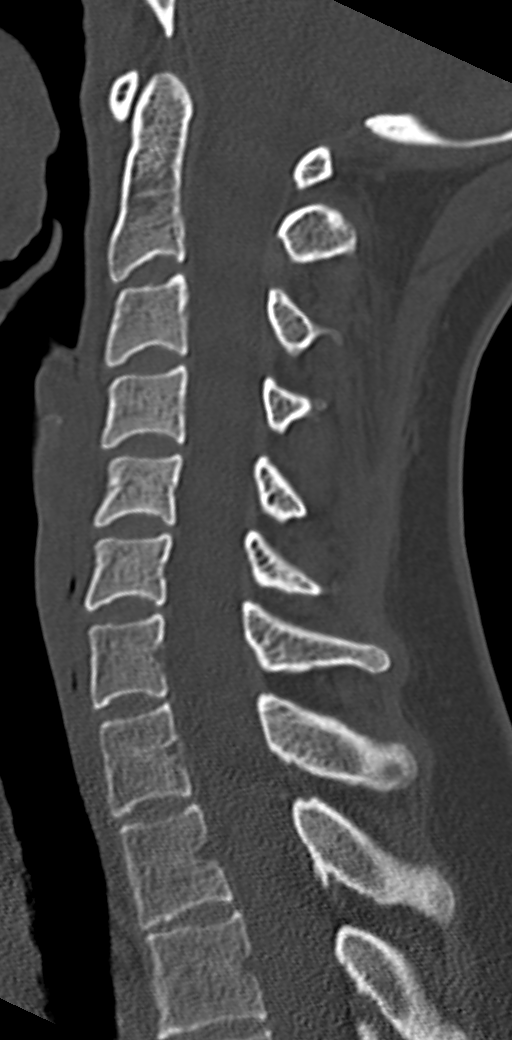
[im 24/42  bone]
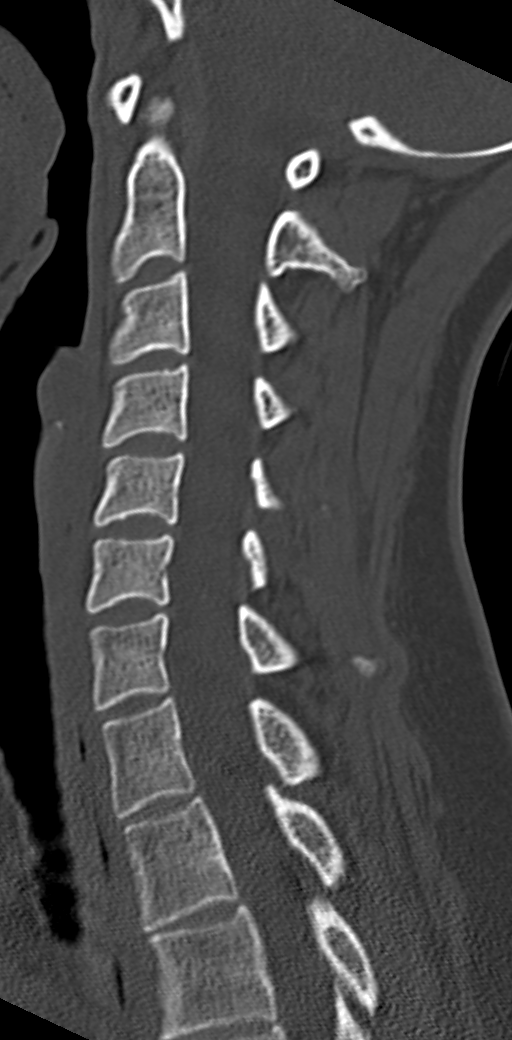
[im 28/42  bone]
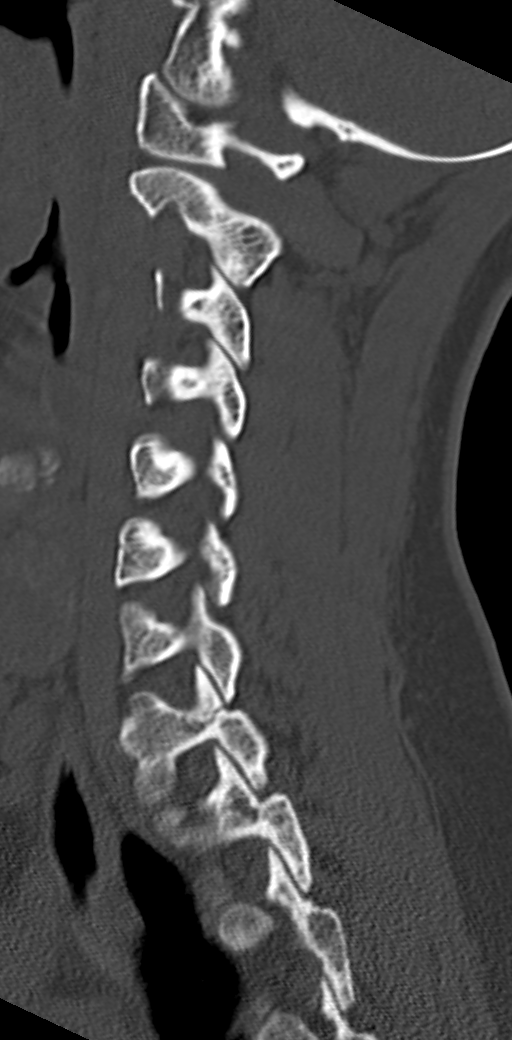

[9 of 33 positions shown; findings below may reference images not displayed]

FINDINGS: There is no evidence of fracture or subluxation. Vertebral bodies
demonstrate normal height and alignment. Intervertebral disc spaces
are preserved. Prevertebral soft tissues are within normal limits.
The visualized neural foramina are grossly unremarkable.

The thyroid gland is unremarkable in appearance. The visualized lung
apices are clear. No significant soft tissue abnormalities are seen.
The visualized portions of the brain are unremarkable.
IMPRESSION: No evidence of fracture or subluxation along the cervical spine.

## 2016-08-06 MED ORDER — METHOCARBAMOL 500 MG PO TABS
500.0000 mg | ORAL_TABLET | Freq: Two times a day (BID) | ORAL | 0 refills | Status: DC | PRN
Start: 2016-08-06 — End: 2018-10-16

## 2016-08-06 MED ORDER — NAPROXEN 500 MG PO TABS: 500.0000 mg | ORAL_TABLET | Freq: Two times a day (BID) | ORAL | 0 refills | Status: DC

## 2016-08-06 NOTE — Discharge Instructions (Signed)
We recommend taking Naproxen as prescribed for the next 5-7 days. Take Robaxin as needed for muscle spasms. This may make you drowsy so do not drive or drink alcohol after taking Robaxin. Return to the ED immediately if you develop incontinence of urine or stool, problems gripping items with your hands or difficulty lifting one or both of your arms, or if you develop changes in sensation in your hands or fingers. You may also return for other new or concerning symptoms. Follow up with your primary care doctor in 1 week for recheck of symptoms.

## 2016-08-06 NOTE — ED Notes (Signed)
Bed: WA09 Expected date:  Expected time:  Means of arrival:  Comments: EVS

## 2016-08-06 NOTE — ED Provider Notes (Signed)
[facility] DEPT Provider Note   CSN: [id number] Arrival date & time: 08/05/16  2322  First Provider Contact:  First MD Initiated Contact with Patient 08/05/16 2353   By signing my name below, I, Dyke Brackett, attest that this documentation has been prepared under the direction and in the presence of non-physician practitioner, Antonietta Breach, PA-C Electronically Signed: Dyke Brackett, Scribe. 08/06/2016. 1:44 AM.   History   Chief Complaint Chief Complaint  Patient presents with  . Neck Injury    HPI Sydney Rios is a 35 y.o. female who presents to the Emergency Department complaining of sudden onset, moderate neck pain with radiation down spine to lower back s/p neck trauma today. Pt states she ran into a playground pole, causing her neck to snap backwards. She had taken ibuprofen in the ED with relief. Pt notes associated headache and nausea. No modifying factors noted. She endorses some tingling, which is not unusual for patient. Pt denies weakness, difficulty gripping, numbness, incontinence, or vomiting.   The history is provided by the patient. No language interpreter was used.    Past Medical History:  Diagnosis Date  . Chronic kidney disease    hx kidney stones  . No pertinent past medical history   . PONV (postoperative nausea and vomiting)     Patient Active Problem List   Diagnosis Date Noted  . S/P cesarean section 10/11/2014  . TACHYCARDIA 05/01/2010    Past Surgical History:  Procedure Laterality Date  . CESAREAN SECTION    . CESAREAN SECTION  01/23/2013   Procedure: CESAREAN SECTION;  Surgeon: Luz Lex, MD;  Location: [facility];  Service: Obstetrics;  Laterality: N/A;  . CESAREAN SECTION N/A 10/11/2014   Procedure: CESAREAN SECTION;  Surgeon: Cyril Mourning, MD;  Location: [facility];  Service: Obstetrics;  Laterality: N/A;  . NO PAST SURGERIES      OB History    Gravida Para Term Preterm AB Living   4 3 3     3    SAB TAB Ectopic Multiple Live  Births           3      Home Medications    Prior to Admission medications   Medication Sig Start Date End Date Taking? Authorizing Provider  acetaminophen (TYLENOL) 500 MG tablet Take 1,000 mg by mouth every 6 (six) hours as needed for mild pain.   Yes Historical Provider, MD  dexmethylphenidate (FOCALIN XR) 10 MG 24 hr capsule Take 10 mg by mouth daily.   Yes Historical Provider, MD  ibuprofen (ADVIL,MOTRIN) 600 MG tablet Take 1 tablet (600 mg total) by mouth every 6 (six) hours as needed for mild pain. Patient not taking: Reported on 12/02/2014 10/14/14   Juanda Chance, NP  oxyCODONE-acetaminophen (PERCOCET/ROXICET) 5-325 MG per tablet Take 1-2 tablets by mouth every 4 (four) hours as needed (for pain scale equal to or greater than 7). Patient not taking: Reported on 12/02/2014 10/14/14   Juanda Chance, NP  scopolamine (TRANSDERM-SCOP) 1 MG/3DAYS Place 1 patch (1.5 mg total) onto the skin every 3 (three) days. Patient not taking: Reported on 08/05/2016 12/12/14   Irine Seal, MD    Family History No family history on file.  Social History Social History  Substance Use Topics  . Smoking status: Never Smoker  . Smokeless tobacco: Never Used  . Alcohol use No     Allergies   Review of patient's allergies indicates no known allergies.   Review of Systems Review of Systems  Musculoskeletal: Positive for back pain and neck pain.  Neurological: Positive for headaches.  10 Systems reviewed and are negative for acute change except as noted in the HPI.    Physical Exam Updated Vital Signs BP 110/79 (BP Location: Right Arm)   Pulse (!) 55   Resp 16   Ht 5\' 4"  (1.626 m)   Wt 150 lb (68 kg)   LMP 07/18/2016 Comment: negative urine preg test 08/06/16  SpO2 100%   Breastfeeding? Unknown   BMI 25.75 kg/m   Physical Exam  Constitutional: She is oriented to person, place, and time. She appears well-developed and well-nourished. No distress.  Nontoxic-appearing  HENT:  Head:  Normocephalic and atraumatic.  Eyes: Conjunctivae and EOM are normal. No scleral icterus.  Neck: Normal range of motion.  Cervical collar removed and normal range of motion exhibited. No bony deformities, step-offs, or crepitus to the cervical midline.  Cardiovascular: Normal rate, regular rhythm and intact distal pulses.   Distal radial pulse 2+ bilaterally  Pulmonary/Chest: Effort normal. No respiratory distress.  Respirations even and unlabored  Musculoskeletal: Normal range of motion.  Neurological: She is alert and oriented to person, place, and time.  Grip strength 5/5 bilaterally. Strength against resistance 5/5 in all major muscle groups in bilateral upper extremities.  Skin: Skin is warm and dry. No rash noted. She is not diaphoretic. No erythema. No pallor.  Psychiatric: She has a normal mood and affect. Her behavior is normal.  Nursing note and vitals reviewed.    ED Treatments / Results  DIAGNOSTIC STUDIES:  Oxygen Saturation is 100% on RA, normal by my interpretation.    COORDINATION OF CARE:  1:39 AM Discussed treatment plan which includes naproxen and Robaxin with pt at bedside and pt agreed to plan.  Labs (all labs ordered are listed, but only abnormal results are displayed) Labs Reviewed  I-STAT BETA HCG BLOOD, ED ([facility], [facility], [facility] ONLY)    EKG  EKG Interpretation None       Radiology Dg Lumbar Spine Complete  Result Date: 08/06/2016 CLINICAL DATA:  Acute low back pain following injury today. Initial encounter. EXAM: LUMBAR SPINE - COMPLETE 4+ VIEW COMPARISON:  02/13/2015 radiographs FINDINGS: There is no evidence of lumbar spine fracture. Alignment is normal. Intervertebral disc spaces are maintained. IMPRESSION: Negative. Electronically Signed   By: Margarette Canada M.D.   On: 08/06/2016 01:09   Ct Cervical Spine Wo Contrast  Result Date: 08/06/2016 CLINICAL DATA:  Injury to neck by running into playground pole. Nausea and posterior neck pain. Initial encounter.  EXAM: CT CERVICAL SPINE WITHOUT CONTRAST TECHNIQUE: Multidetector CT imaging of the cervical spine was performed without intravenous contrast. Multiplanar CT image reconstructions were also generated. COMPARISON:  None. FINDINGS: There is no evidence of fracture or subluxation. Vertebral bodies demonstrate normal height and alignment. Intervertebral disc spaces are preserved. Prevertebral soft tissues are within normal limits. The visualized neural foramina are grossly unremarkable. The thyroid gland is unremarkable in appearance. The visualized lung apices are clear. No significant soft tissue abnormalities are seen. The visualized portions of the brain are unremarkable. IMPRESSION: No evidence of fracture or subluxation along the cervical spine. Electronically Signed   By: Garald Balding M.D.   On: 08/06/2016 00:42    Procedures Procedures (including critical care time)  Medications Ordered in ED Medications  ibuprofen (ADVIL,MOTRIN) tablet 600 mg (0 mg Oral Hold 08/06/16 0102)    Initial Impression / Assessment and Plan / ED Course  I have reviewed the triage  vital signs and the nursing notes.  Pertinent labs & imaging results that were available during my care of the patient were reviewed by me and considered in my medical decision making (see chart for details).  Clinical Course    35 year old female presents to the emergency department for evaluation of neck pain after situational hyperextension. CT scans show no evidence of ligamentous instability or fracture. Patient is neurovascularly intact. She has good strength. I have discussed supportive care with the patient as well as outpatient primary care follow-up. Return precautions discussed and provided. Patient discharged in satisfactory condition.   Final Clinical Impressions(s) / ED Diagnoses   Final diagnoses:  Neck muscle strain, initial encounter  Low back strain, initial encounter    I personally performed the services  described in this documentation, which was scribed in my presence. The recorded information has been reviewed and is accurate.    New Prescriptions New Prescriptions   No medications on file     Antonietta Breach, PA-C 08/18/16 [place], MD 08/18/16 [phone]

## 2016-09-23 IMAGING — US US THYROID
1 series · 14 of 25 positions shown · non-contrast
Comparison: None.

CLINICAL DATA: Other.  Hashimoto's thyroiditis

EXAM:
THYROID ULTRASOUND
TECHNIQUE: Ultrasound examination of the thyroid gland and adjacent soft
tissues was performed.

[Series 1: us thyroid · 0.06mm/px · 14 of 39 slices shown]
[im 1/39]
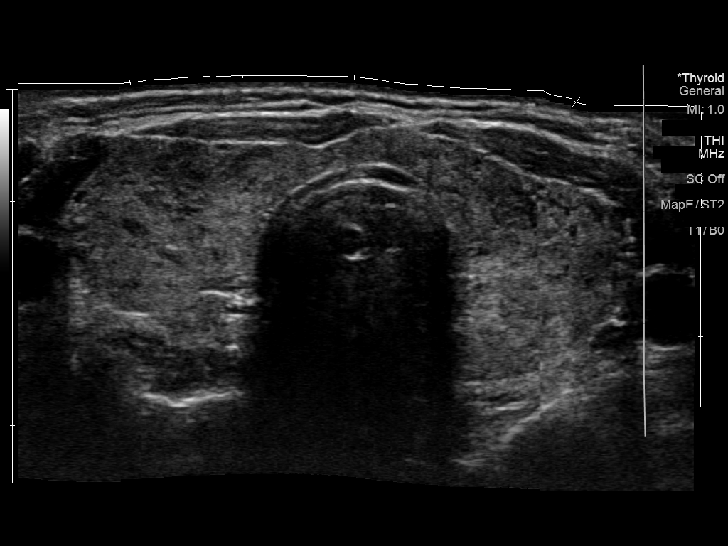
[im 4/39]
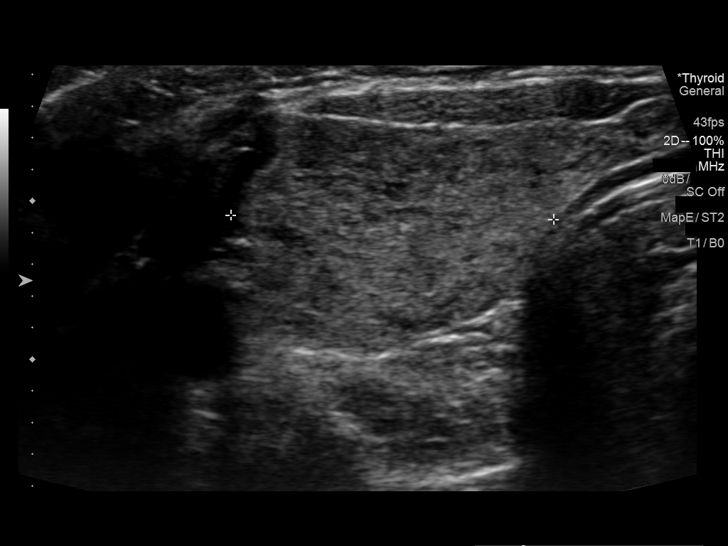
[im 7/39]
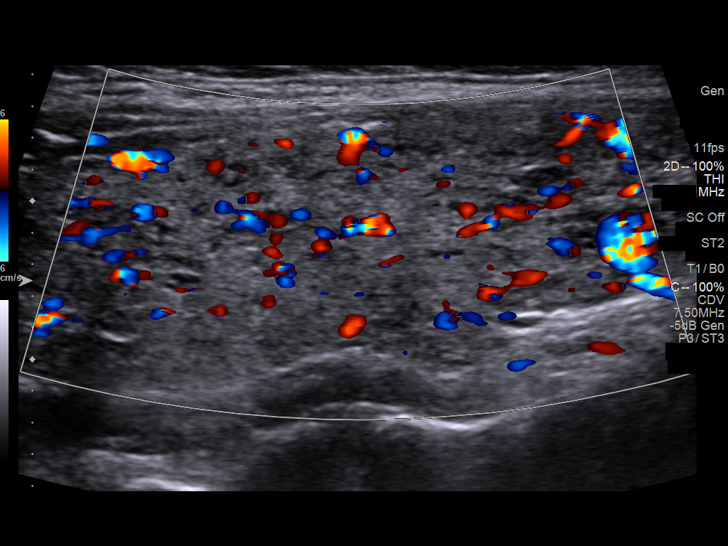
[im 10/39]
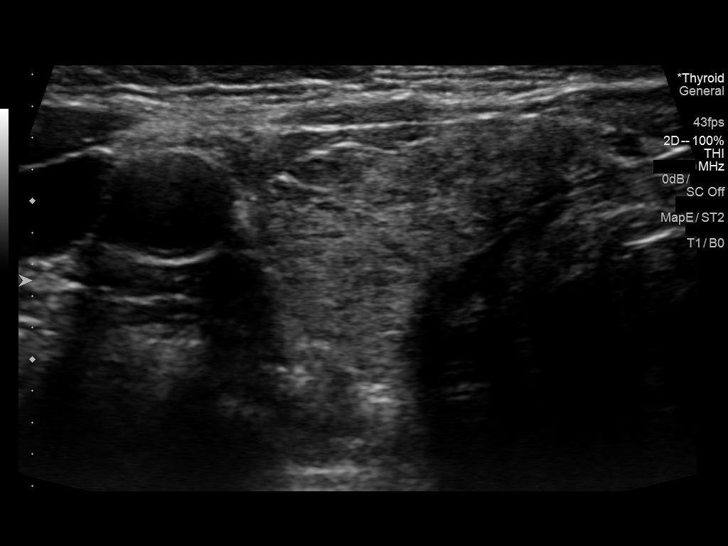
[im 13/39]
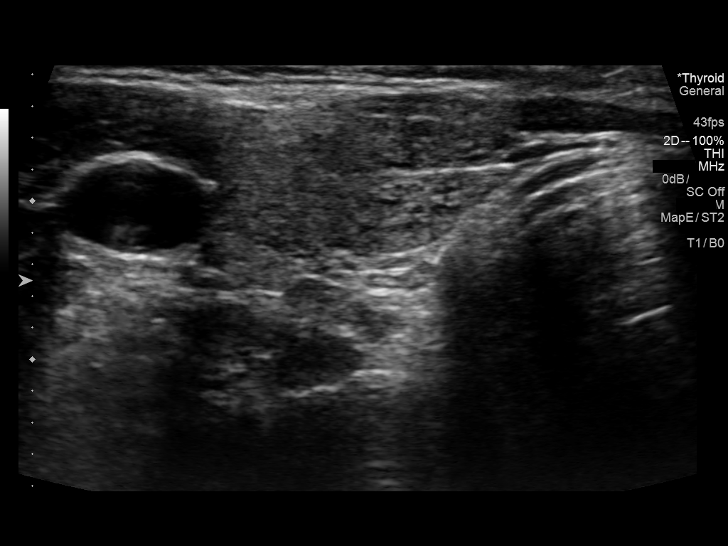
[im 15/39]
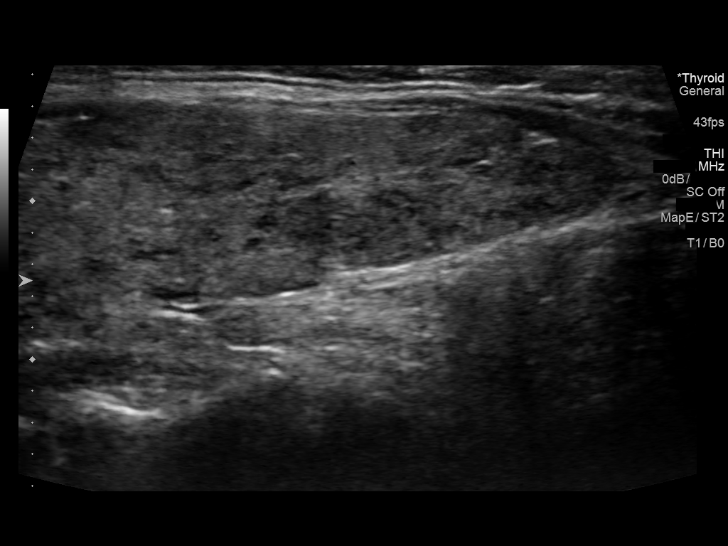
[im 18/39]
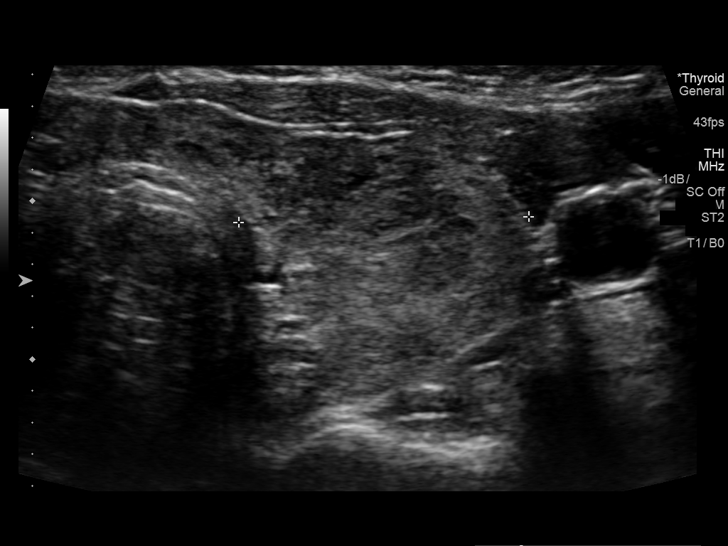
[im 21/39]
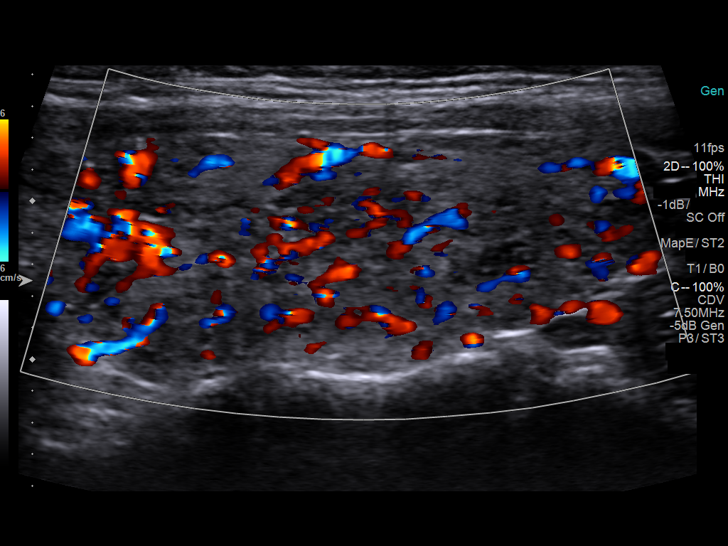
[im 24/39]
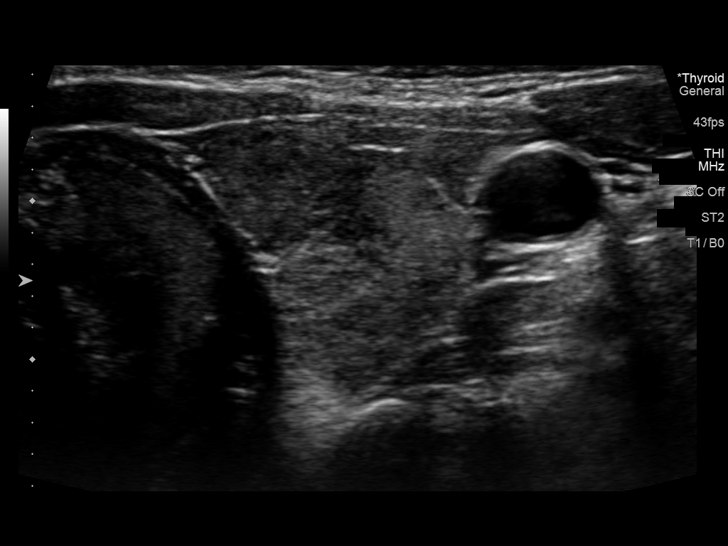
[im 26/39]
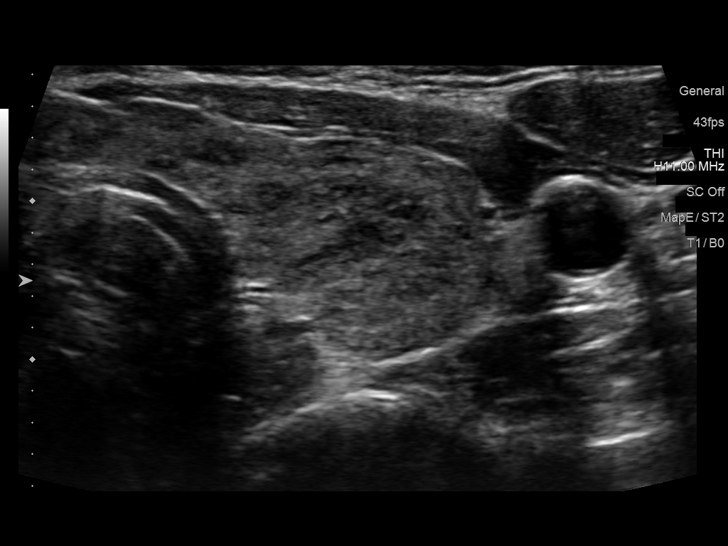
[im 29/39]
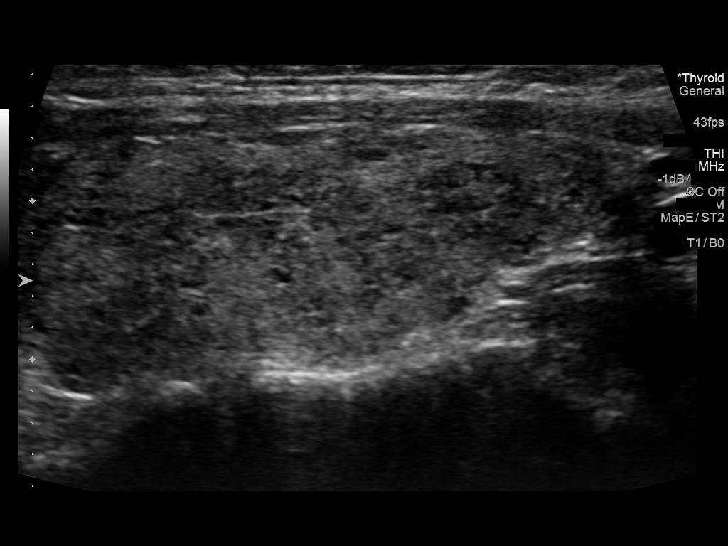
[im 32/39]
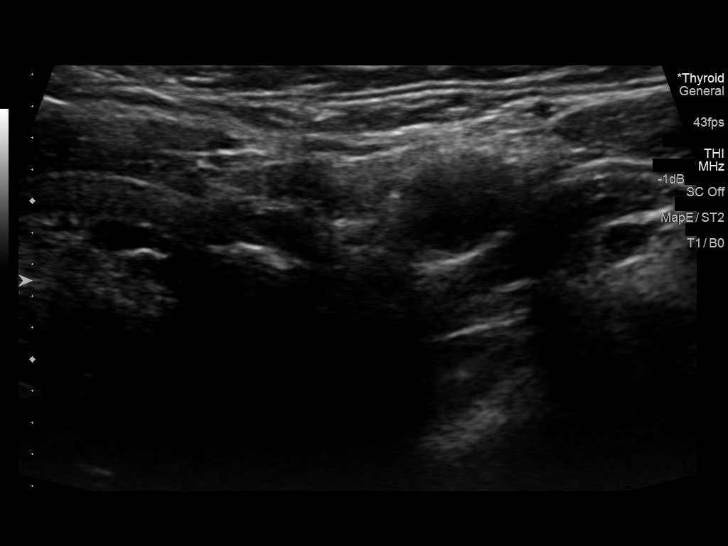
[im 35/39]
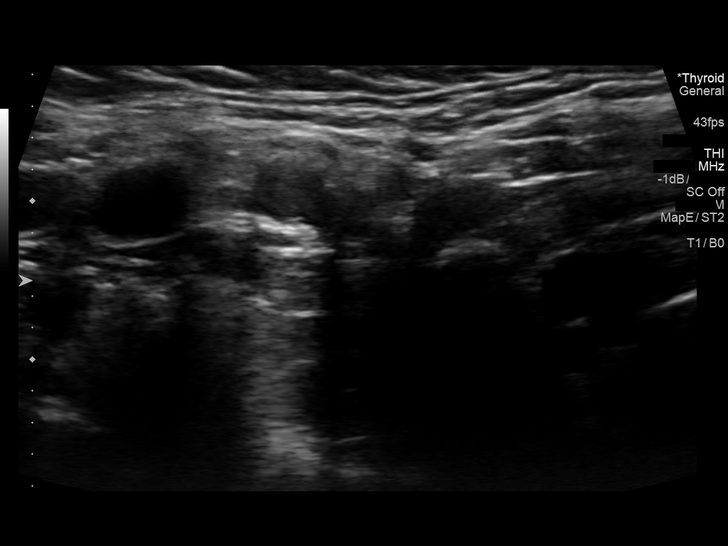
[im 39/39]
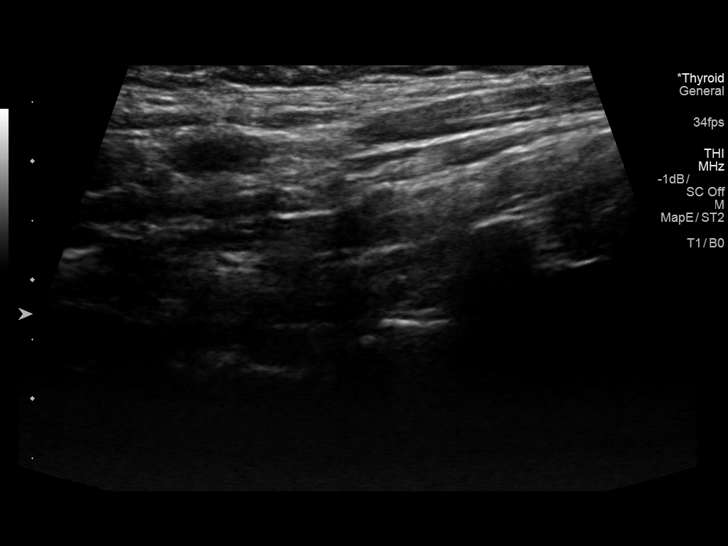

[14 of 25 positions shown; findings below may reference images not displayed]

FINDINGS: Parenchymal Echotexture: Moderately heterogenous - thyroid appears
mildly hyperemic (images 8 and 22)

Isthmus: Normal in size measuring 0.3 cm in diameter

Right lobe: Borderline enlarged measuring 6.3 x 1.6 x 2.0 cm

Left lobe: Normal in size measuring 4.4 x 1.6 x 1.8 cm

_________________________________________________________

Estimated total number of nodules >/= 1 cm: 0

Number of spongiform nodules >/=  2 cm not described below (TR1): 0

Number of mixed cystic and solid nodules >/= 1.5 cm not described
below (TR2): 0

_________________________________________________________

No discrete nodules are seen within the thyroid gland.
IMPRESSION: Mildly enlarged, moderately heterogeneous and potentially hyperemic
thyroid gland without discrete nodule or mass. Findings are
non-specific though could be seen in the setting of provided history
of thyroiditis.

## 2017-05-26 IMAGING — US US RENAL
1 series · 14 of 25 positions shown · non-contrast
Comparison: CT [DATE]

CLINICAL DATA: Flank pain, 30 weeks pregnant

EXAM:
RENAL / URINARY TRACT ULTRASOUND COMPLETE

[Series 1: us renal · 0.20mm/px · 14 of 35 slices shown]
[im 1/35]
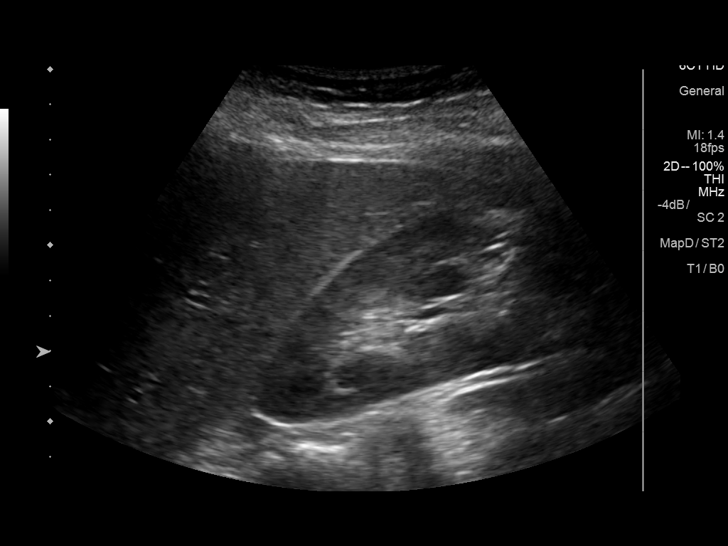
[im 3/35]
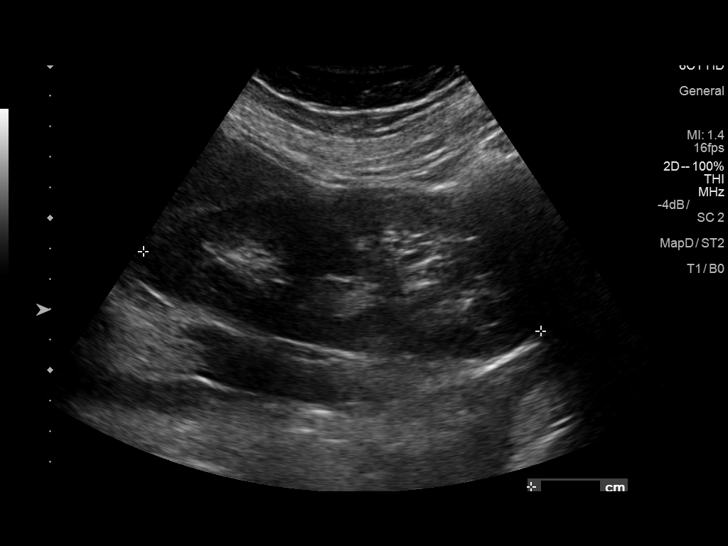
[im 6/35]
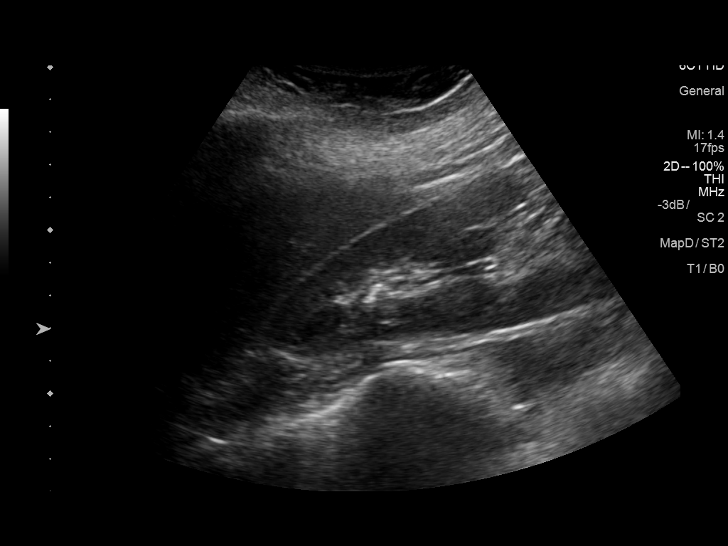
[im 9/35]
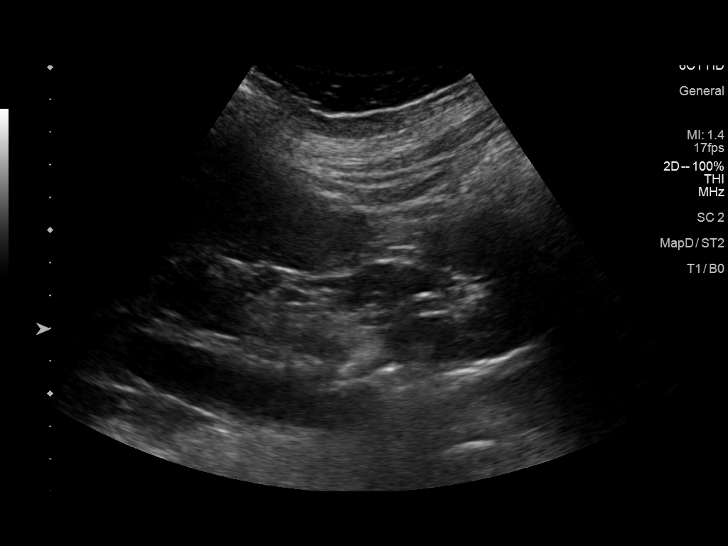
[im 12/35]
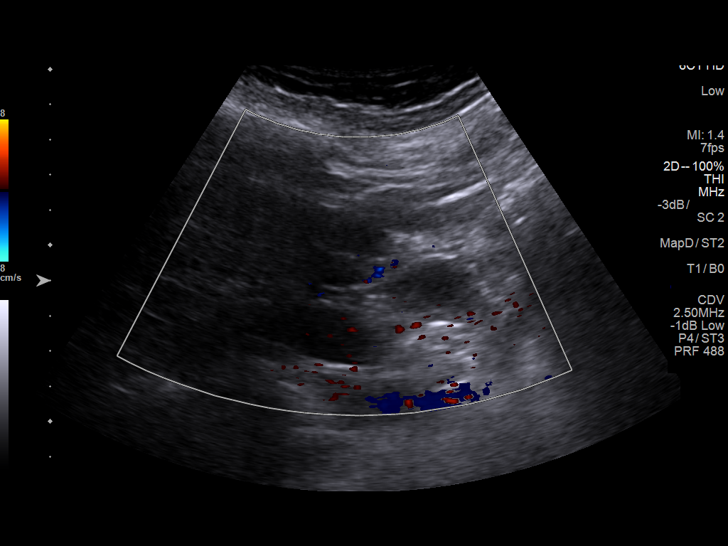
[im 13/35]
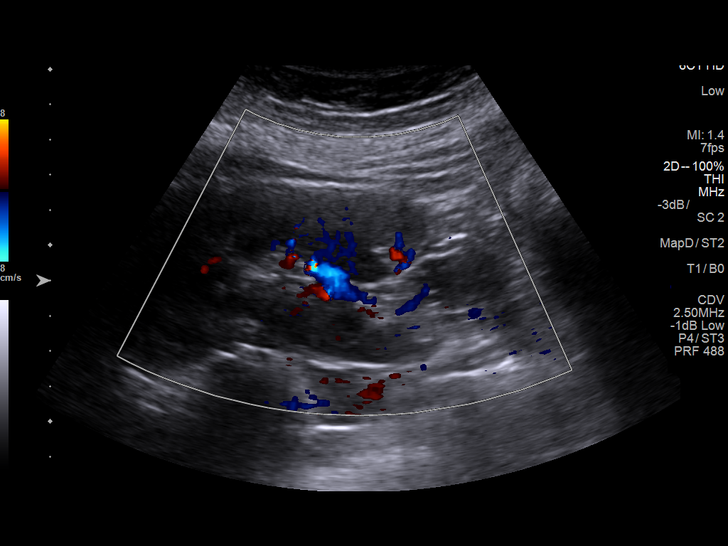
[im 16/35]
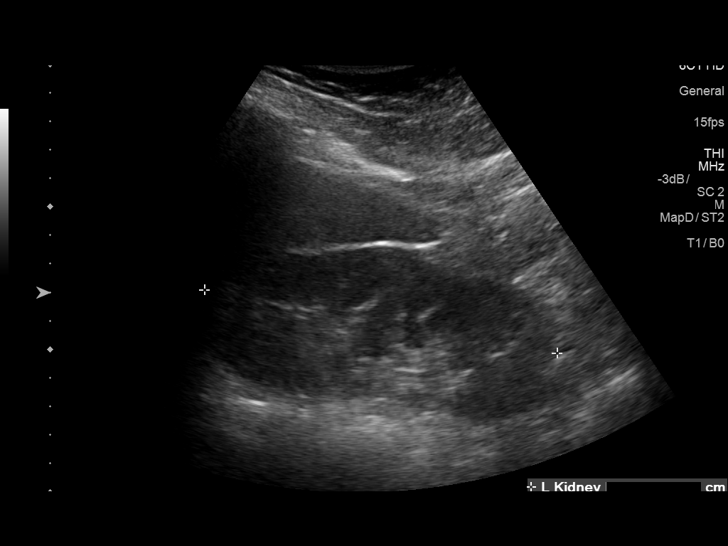
[im 19/35]
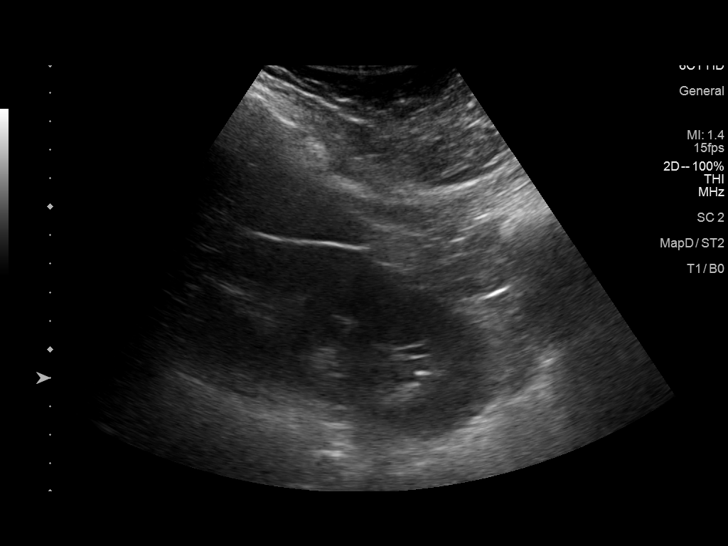
[im 22/35]
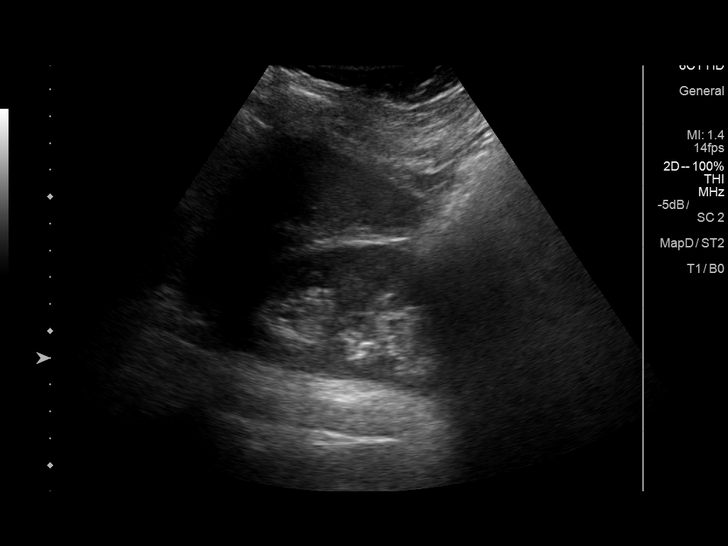
[im 23/35]
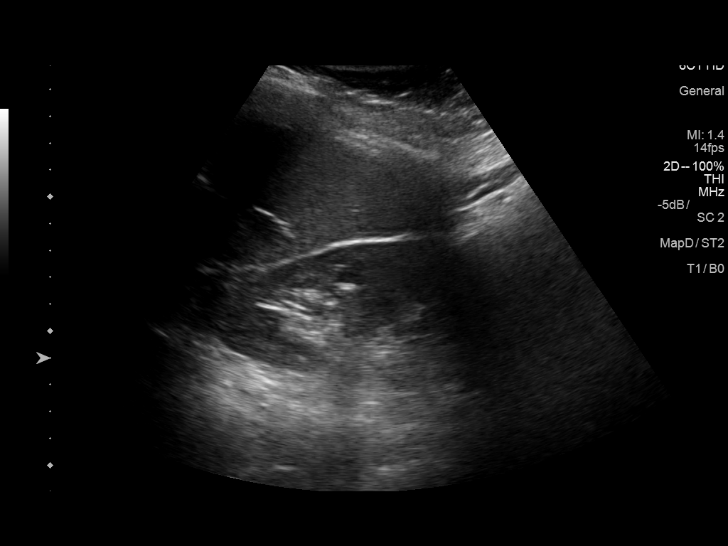
[im 26/35]
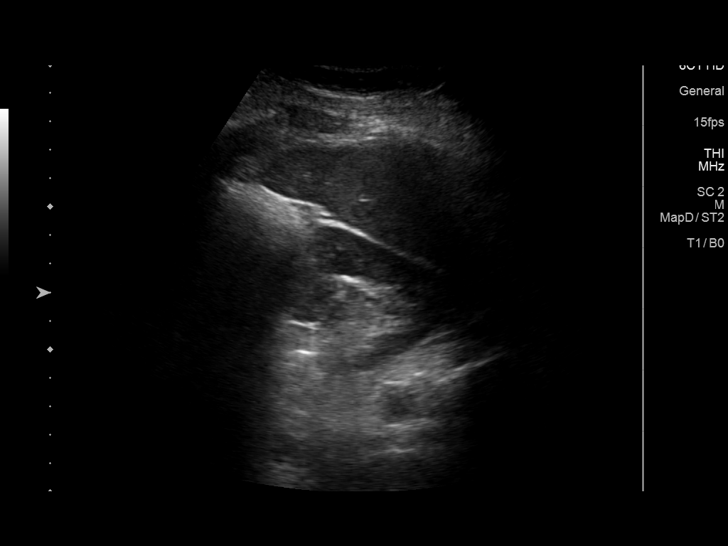
[im 29/35]
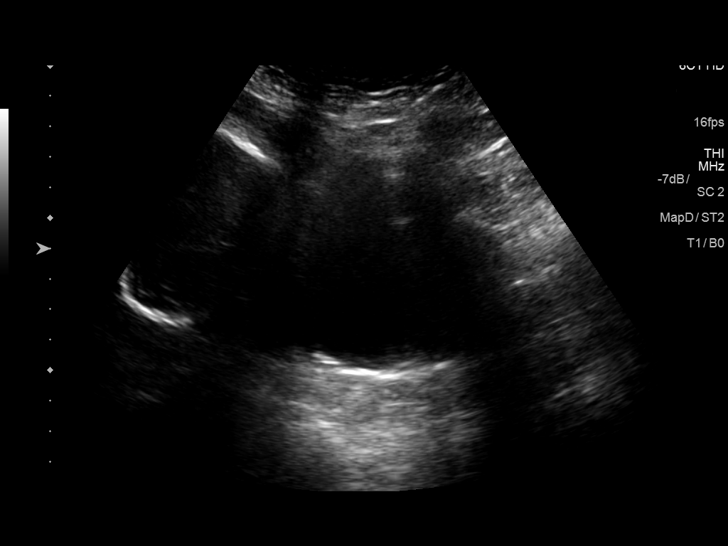
[im 32/35]
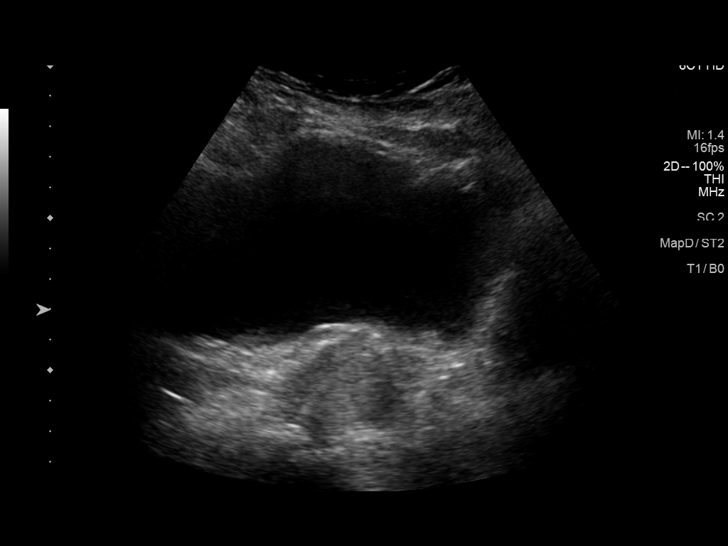
[im 35/35]
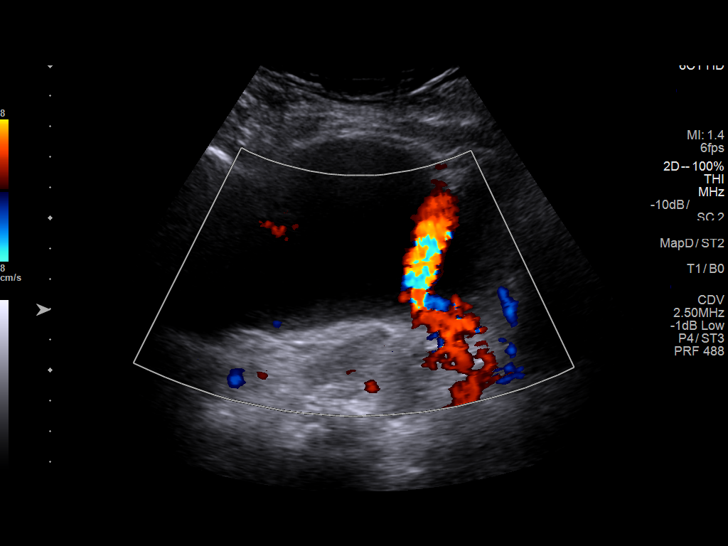

[14 of 25 positions shown; findings below may reference images not displayed]

FINDINGS: Right Kidney:

Length: 13.3 cm. Cortical echogenicity within normal limits. Mild
dilatation of right renal pelvis.

Left Kidney:

Length: 13.2 cm. Echogenicity within normal limits. No mass or
hydronephrosis visualized.

Bladder:

Appears normal for degree of bladder distention.
IMPRESSION: 1. Mild dilatation of right renal pelvis without frank
hydronephrosis. No shadowing stones
2. Left kidney within normal limits

## 2017-12-30 ENCOUNTER — Other Ambulatory Visit: Payer: Self-pay | Admitting: Student

## 2017-12-30 DIAGNOSIS — E063 Autoimmune thyroiditis: Secondary | ICD-10-CM

## 2018-01-06 ENCOUNTER — Ambulatory Visit
Admission: RE | Admit: 2018-01-06 | Discharge: 2018-01-06 | Disposition: A | Discharge status: Home / Self Care | Payer: BC Managed Care – PPO | Source: Ambulatory Visit | Attending: Family Medicine | Admitting: Family Medicine

## 2018-01-06 DIAGNOSIS — E063 Autoimmune thyroiditis: Secondary | ICD-10-CM

## 2018-04-11 LAB — OB RESULTS CONSOLE HEPATITIS B SURFACE ANTIGEN: Hepatitis B Surface Ag: NEGATIVE

## 2018-04-11 LAB — OB RESULTS CONSOLE RUBELLA ANTIBODY, IGM: Rubella: IMMUNE

## 2018-04-11 LAB — OB RESULTS CONSOLE HIV ANTIBODY (ROUTINE TESTING): HIV: NONREACTIVE

## 2018-04-11 LAB — OB RESULTS CONSOLE RPR: RPR: NONREACTIVE

## 2018-04-11 LAB — OB RESULTS CONSOLE ANTIBODY SCREEN: Antibody Screen: NEGATIVE

## 2018-04-11 LAB — OB RESULTS CONSOLE ABO/RH: RH Type: POSITIVE

## 2018-04-11 LAB — OB RESULTS CONSOLE GC/CHLAMYDIA
CHLAMYDIA, DNA PROBE: NEGATIVE
GC PROBE AMP, GENITAL: NEGATIVE

## 2018-09-01 ENCOUNTER — Other Ambulatory Visit: Payer: Self-pay | Admitting: Urology

## 2018-09-01 DIAGNOSIS — R109 Unspecified abdominal pain: Secondary | ICD-10-CM

## 2018-09-08 ENCOUNTER — Ambulatory Visit
Admission: RE | Admit: 2018-09-08 | Discharge: 2018-09-08 | Disposition: A | Discharge status: Home / Self Care | Payer: BC Managed Care – PPO | Source: Ambulatory Visit | Attending: Urology | Admitting: Urology

## 2018-09-08 DIAGNOSIS — R109 Unspecified abdominal pain: Secondary | ICD-10-CM

## 2018-09-13 ENCOUNTER — Encounter: Payer: Self-pay | Admitting: Registered"

## 2018-09-13 ENCOUNTER — Encounter: Payer: BC Managed Care – PPO | Attending: Obstetrics and Gynecology | Admitting: Registered"

## 2018-09-13 DIAGNOSIS — Z713 Dietary counseling and surveillance: Secondary | ICD-10-CM | POA: Insufficient documentation

## 2018-09-13 DIAGNOSIS — O99810 Abnormal glucose complicating pregnancy: Secondary | ICD-10-CM

## 2018-09-15 ENCOUNTER — Encounter: Payer: Self-pay | Admitting: Registered"

## 2018-09-15 DIAGNOSIS — O99810 Abnormal glucose complicating pregnancy: Secondary | ICD-10-CM | POA: Insufficient documentation

## 2018-09-15 NOTE — Progress Notes (Signed)
Patient was seen on 09/13/2018 for Gestational Diabetes self-management class at the [facility]. The following learning objectives were met by the patient during this course:   States the definition of Gestational Diabetes  States why dietary management is important in controlling blood glucose  Describes the effects each nutrient has on blood glucose levels  Demonstrates ability to create a balanced meal plan  Demonstrates carbohydrate counting   States when to check blood glucose levels  Demonstrates proper blood glucose monitoring techniques  States the effect of stress and exercise on blood glucose levels  States the importance of limiting caffeine and abstaining from alcohol and smoking  Blood glucose monitor given: Accu-chek Guide Lot # [id number] Exp: 05/06/19 Blood glucose reading: 93  Patient instructed to monitor glucose levels: FBS: 60 - <95; 1 hour: <140; 2 hour: <120  Patient received handouts:  Nutrition Diabetes and Pregnancy, including carb counting list  Patient will be seen for follow-up as needed.

## 2018-10-09 ENCOUNTER — Encounter (HOSPITAL_COMMUNITY): Payer: Self-pay | Admitting: *Deleted

## 2018-10-18 NOTE — H&P (Signed)
Sydney Rios is a 37 y.o. G 4 P 3 at 39 weeks admitted for Repeat LTCS. OB History    Gravida  4   Para  3   Term  3   Preterm      AB      Living  3     SAB      TAB      Ectopic      Multiple      Live Births  3          Past Medical History:  Diagnosis Date  . Anxiety   . Chronic kidney disease    hx kidney stones  . Gestational diabetes   . Hx of Hashimoto thyroiditis   . No pertinent past medical history   . PONV (postoperative nausea and vomiting)    Past Surgical History:  Procedure Laterality Date  . CESAREAN SECTION    . CESAREAN SECTION  01/23/2013   Procedure: CESAREAN SECTION;  Surgeon: Luz Lex, MD;  Location: [facility];  Service: Obstetrics;  Laterality: N/A;  . CESAREAN SECTION N/A 10/11/2014   Procedure: CESAREAN SECTION;  Surgeon: Cyril Mourning, MD;  Location: [facility];  Service: Obstetrics;  Laterality: N/A;  . LITHOTRIPSY    . NO PAST SURGERIES     Family History: family history includes Diabetes in her maternal grandmother and mother; Heart disease in her maternal grandfather and paternal grandfather; Hypertension in her father, maternal grandfather, maternal grandmother, mother, and paternal grandfather. Social History:  reports that she has never smoked. She has never used smokeless tobacco. She reports that she does not drink alcohol or use drugs.     Maternal Diabetes: No Genetic Screening: Normal Maternal Ultrasounds/Referrals: Normal Fetal Ultrasounds or other Referrals:  None Maternal Substance Abuse:  No Significant Maternal Medications:  None Significant Maternal Lab Results:  None Other Comments:  None  Review of Systems  All other systems reviewed and are negative.  History   Last menstrual period 01/23/2018, unknown if currently breastfeeding. Maternal Exam:  Abdomen: Fetal presentation: vertex     Physical Exam  Nursing note and vitals reviewed. Constitutional: She appears well-developed and  well-nourished.  HENT:  Head: Normocephalic and atraumatic.  Eyes: Pupils are equal, round, and reactive to light.  Neck: Normal range of motion.  Cardiovascular: Normal rate and regular rhythm.  Respiratory: Effort normal.  GI: Soft.  Genitourinary: Vagina normal.    Prenatal labs: ABO, Rh: A/Positive/-- (04/16 0000) Antibody: n (04/16 0000) Rubella: Immune (04/16 0000) RPR: Nonreactive (04/16 0000)  HBsAg: Negative (04/16 0000)  HIV: Non-reactive (04/16 0000)  GBS:     Assessment/Plan: IUP at term Previous Cesarean Section x 3  Repeat LTCS    Haris Baack L 10/18/2018, 1:16 PM

## 2018-10-22 ENCOUNTER — Inpatient Hospital Stay (HOSPITAL_COMMUNITY)
Admission: AD | Admit: 2018-10-22 | Discharge: 2018-10-22 | Disposition: A | Discharge status: Home / Self Care | Payer: BC Managed Care – PPO | Source: Ambulatory Visit | Attending: Obstetrics & Gynecology | Admitting: Obstetrics & Gynecology

## 2018-10-22 DIAGNOSIS — Z3A38 38 weeks gestation of pregnancy: Secondary | ICD-10-CM | POA: Diagnosis not present

## 2018-10-22 DIAGNOSIS — R51 Headache: Secondary | ICD-10-CM | POA: Diagnosis present

## 2018-10-22 DIAGNOSIS — I129 Hypertensive chronic kidney disease with stage 1 through stage 4 chronic kidney disease, or unspecified chronic kidney disease: Secondary | ICD-10-CM | POA: Insufficient documentation

## 2018-10-22 DIAGNOSIS — R030 Elevated blood-pressure reading, without diagnosis of hypertension: Secondary | ICD-10-CM

## 2018-10-22 DIAGNOSIS — N189 Chronic kidney disease, unspecified: Secondary | ICD-10-CM | POA: Insufficient documentation

## 2018-10-22 DIAGNOSIS — O26833 Pregnancy related renal disease, third trimester: Secondary | ICD-10-CM | POA: Insufficient documentation

## 2018-10-22 DIAGNOSIS — O26893 Other specified pregnancy related conditions, third trimester: Secondary | ICD-10-CM | POA: Diagnosis present

## 2018-10-22 LAB — URINALYSIS, ROUTINE W REFLEX MICROSCOPIC
BILIRUBIN URINE: NEGATIVE
Glucose, UA: NEGATIVE mg/dL
HGB URINE DIPSTICK: NEGATIVE
KETONES UR: 5 mg/dL — AB
LEUKOCYTES UA: NEGATIVE
NITRITE: NEGATIVE
PROTEIN: NEGATIVE mg/dL
Specific Gravity, Urine: 1.009 (ref 1.005–1.030)
pH: 6 (ref 5.0–8.0)

## 2018-10-22 LAB — CBC
HEMATOCRIT: 30.7 % — AB (ref 36.0–46.0)
Hemoglobin: 9.8 g/dL — ABNORMAL LOW (ref 12.0–15.0)
MCH: 26.8 pg (ref 26.0–34.0)
MCHC: 31.9 g/dL (ref 30.0–36.0)
MCV: 84.1 fL (ref 80.0–100.0)
NRBC: 0 % (ref 0.0–0.2)
PLATELETS: 163 10*3/uL (ref 150–400)
RBC: 3.65 MIL/uL — ABNORMAL LOW (ref 3.87–5.11)
RDW: 14.5 % (ref 11.5–15.5)
WBC: 6.7 10*3/uL (ref 4.0–10.5)

## 2018-10-22 LAB — COMPREHENSIVE METABOLIC PANEL
ALBUMIN: 2.6 g/dL — AB (ref 3.5–5.0)
ALK PHOS: 96 U/L (ref 38–126)
ALT: 11 U/L (ref 0–44)
ANION GAP: 9 (ref 5–15)
AST: 16 U/L (ref 15–41)
BUN: 13 mg/dL (ref 6–20)
CALCIUM: 8.6 mg/dL — AB (ref 8.9–10.3)
CO2: 19 mmol/L — AB (ref 22–32)
Chloride: 109 mmol/L (ref 98–111)
Creatinine, Ser: 0.66 mg/dL (ref 0.44–1.00)
GFR calc Af Amer: >60 mL/min (ref >60)
GFR calc non Af Amer: >60 mL/min (ref >60)
GLUCOSE: 90 mg/dL (ref 70–99)
Potassium: 4.3 mmol/L (ref 3.5–5.1)
SODIUM: 137 mmol/L (ref 135–145)
TOTAL PROTEIN: 5.5 g/dL — AB (ref 6.5–8.1)
Total Bilirubin: 0.5 mg/dL (ref 0.3–1.2)

## 2018-10-22 LAB — PROTEIN / CREATININE RATIO, URINE
Creatinine, Urine: 68 mg/dL
PROTEIN CREATININE RATIO: 0.1 mg/mg{creat} (ref 0.00–0.15)
Total Protein, Urine: 7 mg/dL

## 2018-10-22 NOTE — Discharge Instructions (Signed)
Preeclampsia and Eclampsia °Preeclampsia is a serious condition that develops only during pregnancy. It is also called toxemia of pregnancy. This condition causes high blood pressure along with other symptoms, such as swelling and headaches. These symptoms may develop as the condition gets worse. Preeclampsia may occur at 20 weeks of pregnancy or later. °Diagnosing and treating preeclampsia early is very important. If not treated early, it can cause serious problems for you and your baby. One problem it can lead to is eclampsia, which is a condition that causes muscle jerking or shaking (convulsions or seizures) in the mother. Delivering your baby is the best treatment for preeclampsia or eclampsia. Preeclampsia and eclampsia symptoms usually go away after your baby is born. °What are the causes? °The cause of preeclampsia is not known. °What increases the risk? °The following risk factors make you more likely to develop preeclampsia: °· Being pregnant for the first time. °· Having had preeclampsia during a past pregnancy. °· Having a family history of preeclampsia. °· Having high blood pressure. °· Being pregnant with twins or triplets. °· Being 35 or older. °· Being African-American. °· Having kidney disease or diabetes. °· Having medical conditions such as lupus or blood diseases. °· Being very overweight (obese). ° °What are the signs or symptoms? °The earliest signs of preeclampsia are: °· High blood pressure. °· Increased protein in your urine. Your health care provider will check for this at every visit before you give birth (prenatal visit). ° °Other symptoms that may develop as the condition gets worse include: °· Severe headaches. °· Sudden weight gain. °· Swelling of the hands, face, legs, and feet. °· Nausea and vomiting. °· Vision problems, such as blurred or double vision. °· Numbness in the face, arms, legs, and feet. °· Urinating less than usual. °· Dizziness. °· Slurred speech. °· Abdominal pain,  especially upper abdominal pain. °· Convulsions or seizures. ° °Symptoms generally go away after giving birth. °How is this diagnosed? °There are no screening tests for preeclampsia. Your health care provider will ask you about symptoms and check for signs of preeclampsia during your prenatal visits. You may also have tests that include: °· Urine tests. °· Blood tests. °· Checking your blood pressure. °· Monitoring your baby’s heart rate. °· Ultrasound. ° °How is this treated? °You and your health care provider will determine the treatment approach that is best for you. Treatment may include: °· Having more frequent prenatal exams to check for signs of preeclampsia, if you have an increased risk for preeclampsia. °· Bed rest. °· Reducing how much salt (sodium) you eat. °· Medicine to lower your blood pressure. °· Staying in the hospital, if your condition is severe. There, treatment will focus on controlling your blood pressure and the amount of fluids in your body (fluid retention). °· You may need to take medicine (magnesium sulfate) to prevent seizures. This medicine may be given as an injection or through an IV tube. °· Delivering your baby early, if your condition gets worse. You may have your labor started with medicine (induced), or you may have a cesarean delivery. ° °Follow these instructions at home: °Eating and drinking ° °· Drink enough fluid to keep your urine clear or pale yellow. °· Eat a healthy diet that is low in sodium. Do not add salt to your food. Check nutrition labels to see how much sodium a food or beverage contains. °· Avoid caffeine. °Lifestyle °· Do not use any products that contain nicotine or tobacco, such as cigarettes   and e-cigarettes. If you need help quitting, ask your health care provider. °· Do not use alcohol or drugs. °· Avoid stress as much as possible. Rest and get plenty of sleep. °General instructions °· Take over-the-counter and prescription medicines only as told by your  health care provider. °· When lying down, lie on your side. This keeps pressure off of your baby. °· When sitting or lying down, raise (elevate) your feet. Try putting some pillows underneath your lower legs. °· Exercise regularly. Ask your health care provider what kinds of exercise are best for you. °· Keep all follow-up and prenatal visits as told by your health care provider. This is important. °How is this prevented? °To prevent preeclampsia or eclampsia from developing during another pregnancy: °· Get proper medical care during pregnancy. Your health care provider may be able to prevent preeclampsia or diagnose and treat it early. °· Your health care provider may have you take a low-dose aspirin or a calcium supplement during your next pregnancy. °· You may have tests of your blood pressure and kidney function after giving birth. °· Maintain a healthy weight. Ask your health care provider for help managing weight gain during pregnancy. °· Work with your health care provider to manage any long-term (chronic) health conditions you have, such as diabetes or kidney problems. ° °Contact a health care provider if: °· You gain more weight than expected. °· You have headaches. °· You have nausea or vomiting. °· You have abdominal pain. °· You feel dizzy or light-headed. °Get help right away if: °· You develop sudden or severe swelling anywhere in your body. This usually happens in the legs. °· You gain 5 lbs (2.3 kg) or more during one week. °· You have severe: °? Abdominal pain. °? Headaches. °? Dizziness. °? Vision problems. °? Confusion. °? Nausea or vomiting. °· You have a seizure. °· You have trouble moving any part of your body. °· You develop numbness in any part of your body. °· You have trouble speaking. °· You have any abnormal bleeding. °· You pass out. °This information is not intended to replace advice given to you by your health care provider. Make sure you discuss any questions you have with your health  care provider. °Document Released: 12/10/2000 Document Revised: 08/10/2016 Document Reviewed: 07/19/2016 °Elsevier Interactive Patient Education © 2018 [employer] ° °

## 2018-10-22 NOTE — MAU Note (Signed)
Pt presents to MAU with complaints of floaters,flashes of light and headache that  Is in the background.  States BP at home 140/93.   Called office and told to come in for evaluation.  Denies LOF, vaginal bleeding, + FM. Occasional contraction.

## 2018-10-22 NOTE — MAU Provider Note (Addendum)
History   Patient Sydney Rios is a 37 y.o.  [phone] At [redacted]w[redacted]d here with complaints of vision changes, and  high blood pressure (based on her home bp measurements). She denies bleeding, LOF, decreased fetal movements. She is scheduled for a repeat c-section on Tuesday. She has gestational diabetes, diet control, otherwise uncomplicated pregnancy.     CSN: [id number]  Arrival date and time: 10/22/18 2028   None     Chief Complaint  Patient presents with  . Headache  . Hypertension   Headache   This is a new problem. The pain is located in the occipital region. The quality of the pain is described as aching. The pain is at a severity of 2/10. Associated symptoms include abdominal pain. Associated symptoms comments: contractions. Nothing aggravates the symptoms. She has tried nothing (she says its not bad enough to take anything.  She doesn't want anything for it right now. ) for the symptoms. Her past medical history is significant for hypertension.  Hypertension  Associated symptoms include headaches.  She was standing up making dinner at 7 pm and looked at her daughter and had some white spots in her vision, which then spread to her peripheral vision. It went away by 7:45 it had decreased; she sees it now when she tries to focus on something.  She took her blood pressure at home and it was 144/93 with her arm, and 143/87, 135/85 between 7 and 8 pm tonight.   Patient is having frequent contractions, q 2-3. These contraction pattern is normal for her; she says she has these contractions nightly and they go away.  OB History    Gravida  4   Para  3   Term  3   Preterm      AB      Living  3     SAB      TAB      Ectopic      Multiple      Live Births  3           Past Medical History:  Diagnosis Date  . Anxiety   . Chronic kidney disease    hx kidney stones  . Gestational diabetes   . Hx of Hashimoto thyroiditis   . No pertinent past medical history   .  PONV (postoperative nausea and vomiting)     Past Surgical History:  Procedure Laterality Date  . CESAREAN SECTION    . CESAREAN SECTION  01/23/2013   Procedure: CESAREAN SECTION;  Surgeon: Luz Lex, MD;  Location: [facility];  Service: Obstetrics;  Laterality: N/A;  . CESAREAN SECTION N/A 10/11/2014   Procedure: CESAREAN SECTION;  Surgeon: Cyril Mourning, MD;  Location: [facility];  Service: Obstetrics;  Laterality: N/A;  . LITHOTRIPSY    . NO PAST SURGERIES      Family History  Problem Relation Age of Onset  . Hypertension Mother   . Diabetes Mother   . Hypertension Father   . Hypertension Maternal Grandmother   . Diabetes Maternal Grandmother   . Heart disease Maternal Grandfather   . Hypertension Maternal Grandfather   . Heart disease Paternal Grandfather   . Hypertension Paternal Grandfather     Social History   Tobacco Use  . Smoking status: Never Smoker  . Smokeless tobacco: Never Used  Substance Use Topics  . Alcohol use: No  . Drug use: No    Allergies: No Known Allergies  Medications Prior to Admission  Medication Sig Dispense Refill Last Dose  . Prenatal Vit-Fe Fumarate-FA (PRENATAL MULTIVITAMIN) TABS tablet Take 1 tablet by mouth daily.      . valACYclovir (VALTREX) 1000 MG tablet Take 1,000 mg by mouth daily as needed. Fever blisters  0     Review of Systems  HENT: Negative.   Respiratory: Negative.   Gastrointestinal: Positive for abdominal pain.  Musculoskeletal: Negative.   Neurological: Positive for headaches.  Psychiatric/Behavioral: Negative.    Physical Exam   Blood pressure 136/73, pulse 87, temperature 98.9 F (37.2 C), resp. rate 18, height 5\' 7"  (1.702 m), last menstrual period 01/23/2018, unknown if currently breastfeeding.  Physical Exam  Constitutional: She is oriented to person, place, and time. She appears well-developed.  HENT:  Head: Normocephalic.  Neck: Normal range of motion.  Cardiovascular: Normal rate.  GI: Soft.   Genitourinary:  Genitourinary Comments: NEFG; cervix is long, closed and posterior.   Musculoskeletal: Normal range of motion.  Neurological: She is alert and oriented to person, place, and time.  Skin: Skin is warm.    MAU Course  Procedures  MDM -NST: 135 bpm, mod var, present acel, neg decels, ctx  1-2  -pre-e labs normal -patient declines medication for HA at this time.  -feels strong contractions. cervical exam on Monday was closed; still closed now.   Assessment and Plan   1. Transient hypertension    2. Patient's BP normal in MAU, pre-e labs are normal and cervix is unchanged.   3. Stable for discharge with plans to keep pre-op appointment tomorrow. Return precautions reviewed.   4. Patient understands precautions, stable for discharge.     Mervyn Skeeters Kooistra 10/22/2018, 9:25 PM

## 2018-10-23 ENCOUNTER — Encounter (HOSPITAL_COMMUNITY)
Admission: RE | Admit: 2018-10-23 | Discharge: 2018-10-23 | Disposition: A | Discharge status: Home / Self Care | Payer: BC Managed Care – PPO | Source: Ambulatory Visit | Attending: Obstetrics and Gynecology | Admitting: Obstetrics and Gynecology

## 2018-10-23 HISTORY — DX: Personal history of other endocrine, nutritional and metabolic disease: Z86.39

## 2018-10-23 HISTORY — DX: Anxiety disorder, unspecified: F41.9

## 2018-10-23 HISTORY — DX: Gestational diabetes mellitus in pregnancy, unspecified control: O24.419

## 2018-10-23 LAB — RAPID HIV SCREEN (HIV 1/2 AB+AG)
HIV 1/2 Antibodies: NONREACTIVE
HIV-1 P24 Antigen - HIV24: NONREACTIVE

## 2018-10-23 LAB — TYPE AND SCREEN
ABO/RH(D): A POS
Antibody Screen: NEGATIVE

## 2018-10-23 LAB — CBC
HEMATOCRIT: 30.4 % — AB (ref 36.0–46.0)
Hemoglobin: 9.9 g/dL — ABNORMAL LOW (ref 12.0–15.0)
MCH: 27.4 pg (ref 26.0–34.0)
MCHC: 32.6 g/dL (ref 30.0–36.0)
MCV: 84.2 fL (ref 80.0–100.0)
PLATELETS: 172 10*3/uL (ref 150–400)
RBC: 3.61 MIL/uL — ABNORMAL LOW (ref 3.87–5.11)
RDW: 14.6 % (ref 11.5–15.5)
WBC: 6 10*3/uL (ref 4.0–10.5)
nRBC: 0 % (ref 0.0–0.2)

## 2018-10-23 NOTE — Patient Instructions (Signed)
Sydney Rios  10/23/2018   Your procedure is scheduled on:  10/24/2018  Enter through the Main Entrance of [facility] [place] up the phone at the desk and dial [phone]  Call this number if you have problems the morning of surgery:[phone]  Remember:   Do not eat food:(After Midnight) Desps de medianoche.  Do not drink clear liquids: (After Midnight) Desps de medianoche.  Take these medicines the morning of surgery with A SIP OF WATER: valtrex   Do not wear jewelry, make-up or nail polish.  Do not wear lotions, powders, or perfumes. Do not wear deodorant.  Do not shave 48 hours prior to surgery.  Do not bring valuables to the hospital.   [facility] is not   responsible for any belongings or valuables brought to the hospital.  Contacts, dentures or bridgework may not be worn into surgery.  Leave suitcase in the car. After surgery it may be brought to your room.  For patients admitted to the hospital, checkout time is 11:00 AM the day of              discharge.    N/A   Please read over the following fact sheets that you were given:   Surgical Site Infection Prevention

## 2018-10-24 ENCOUNTER — Inpatient Hospital Stay (HOSPITAL_COMMUNITY): Payer: BC Managed Care – PPO | Admitting: Anesthesiology

## 2018-10-24 ENCOUNTER — Encounter (HOSPITAL_COMMUNITY)
Admission: AD | Disposition: A | Discharge status: Home / Self Care | Payer: Self-pay | Source: Home / Self Care | Attending: Obstetrics and Gynecology

## 2018-10-24 ENCOUNTER — Other Ambulatory Visit: Payer: Self-pay

## 2018-10-24 ENCOUNTER — Inpatient Hospital Stay (HOSPITAL_COMMUNITY)
Admission: AD | Admit: 2018-10-24 | Discharge: 2018-10-27 | DRG: 788 | Disposition: A | Discharge status: Home / Self Care | Payer: BC Managed Care – PPO | Attending: Obstetrics and Gynecology | Admitting: Obstetrics and Gynecology

## 2018-10-24 ENCOUNTER — Encounter (HOSPITAL_COMMUNITY): Payer: Self-pay

## 2018-10-24 DIAGNOSIS — O9902 Anemia complicating childbirth: Secondary | ICD-10-CM | POA: Diagnosis present

## 2018-10-24 DIAGNOSIS — Z98891 History of uterine scar from previous surgery: Secondary | ICD-10-CM

## 2018-10-24 DIAGNOSIS — O3663X0 Maternal care for excessive fetal growth, third trimester, not applicable or unspecified: Secondary | ICD-10-CM | POA: Diagnosis present

## 2018-10-24 DIAGNOSIS — Z87442 Personal history of urinary calculi: Secondary | ICD-10-CM | POA: Diagnosis not present

## 2018-10-24 DIAGNOSIS — D649 Anemia, unspecified: Secondary | ICD-10-CM | POA: Diagnosis present

## 2018-10-24 DIAGNOSIS — O34211 Maternal care for low transverse scar from previous cesarean delivery: Principal | ICD-10-CM | POA: Diagnosis present

## 2018-10-24 DIAGNOSIS — Z3A39 39 weeks gestation of pregnancy: Secondary | ICD-10-CM | POA: Diagnosis not present

## 2018-10-24 LAB — RPR: RPR Ser Ql: NONREACTIVE

## 2018-10-24 SURGERY — Surgical Case
Anesthesia: Spinal

## 2018-10-24 MED ORDER — METOCLOPRAMIDE HCL 5 MG/ML IJ SOLN
INTRAMUSCULAR | Status: DC | PRN
  Administered 2018-10-24: 10 mg via INTRAVENOUS

## 2018-10-24 MED ORDER — NALBUPHINE HCL 10 MG/ML IJ SOLN
5.0000 mg | INTRAMUSCULAR | Status: DC | PRN
  Administered 2018-10-24 – 2018-10-25 (×2): 5 mg via INTRAVENOUS
  Filled 2018-10-24 (×2): qty 1

## 2018-10-24 MED ORDER — DIPHENHYDRAMINE HCL 25 MG PO CAPS: 25.0000 mg | ORAL_CAPSULE | Freq: Four times a day (QID) | ORAL | Status: DC | PRN

## 2018-10-24 MED ORDER — NALBUPHINE HCL 10 MG/ML IJ SOLN: 5.0000 mg | Freq: Once | INTRAMUSCULAR | Status: DC | PRN

## 2018-10-24 MED ORDER — PANTOPRAZOLE SODIUM 40 MG PO TBEC
40.0000 mg | DELAYED_RELEASE_TABLET | Freq: Once | ORAL | Status: AC
  Administered 2018-10-25: 40 mg via ORAL
  Filled 2018-10-24: qty 1

## 2018-10-24 MED ORDER — FENTANYL CITRATE (PF) 100 MCG/2ML IJ SOLN
INTRAMUSCULAR | Status: DC | PRN
  Administered 2018-10-24: 15 ug via INTRATHECAL

## 2018-10-24 MED ORDER — TETANUS-DIPHTH-ACELL PERTUSSIS 5-2.5-18.5 LF-MCG/0.5 IM SUSP: 0.5000 mL | Freq: Once | INTRAMUSCULAR | Status: DC

## 2018-10-24 MED ORDER — OXYTOCIN 40 UNITS IN LACTATED RINGERS INFUSION - SIMPLE MED: 2.5000 [IU]/h | INTRAVENOUS | Status: AC

## 2018-10-24 MED ORDER — MORPHINE SULFATE (PF) 0.5 MG/ML IJ SOLN
INTRAMUSCULAR | Status: AC
  Filled 2018-10-24: qty 10

## 2018-10-24 MED ORDER — LACTATED RINGERS IV SOLN
INTRAVENOUS | Status: DC
  Administered 2018-10-24 (×2): via INTRAVENOUS

## 2018-10-24 MED ORDER — KETOROLAC TROMETHAMINE 30 MG/ML IJ SOLN: 30.0000 mg | Freq: Four times a day (QID) | INTRAMUSCULAR | Status: DC | PRN

## 2018-10-24 MED ORDER — DEXAMETHASONE SODIUM PHOSPHATE 4 MG/ML IJ SOLN
INTRAMUSCULAR | Status: AC
  Filled 2018-10-24: qty 1

## 2018-10-24 MED ORDER — LACTATED RINGERS IV SOLN
INTRAVENOUS | Status: DC
  Administered 2018-10-24: 17:00:00 via INTRAVENOUS

## 2018-10-24 MED ORDER — IBUPROFEN 600 MG PO TABS
600.0000 mg | ORAL_TABLET | Freq: Four times a day (QID) | ORAL | Status: DC
  Administered 2018-10-24 – 2018-10-27 (×11): 600 mg via ORAL
  Filled 2018-10-24 (×11): qty 1

## 2018-10-24 MED ORDER — PROMETHAZINE HCL 25 MG/ML IJ SOLN: 6.2500 mg | INTRAMUSCULAR | Status: DC | PRN

## 2018-10-24 MED ORDER — ALBUTEROL SULFATE HFA 108 (90 BASE) MCG/ACT IN AERS
INHALATION_SPRAY | RESPIRATORY_TRACT | Status: AC
  Filled 2018-10-24: qty 6.7

## 2018-10-24 MED ORDER — SIMETHICONE 80 MG PO CHEW
80.0000 mg | CHEWABLE_TABLET | ORAL | Status: DC
  Administered 2018-10-25 – 2018-10-27 (×3): 80 mg via ORAL
  Filled 2018-10-24 (×3): qty 1

## 2018-10-24 MED ORDER — OXYTOCIN 10 UNIT/ML IJ SOLN
INTRAVENOUS | Status: DC | PRN
  Administered 2018-10-24: 40 [IU] via INTRAVENOUS

## 2018-10-24 MED ORDER — SODIUM CHLORIDE 0.9 % IR SOLN
Status: DC | PRN
  Administered 2018-10-24: 1

## 2018-10-24 MED ORDER — KETOROLAC TROMETHAMINE 30 MG/ML IJ SOLN: 30.0000 mg | Freq: Once | INTRAMUSCULAR | Status: DC | PRN

## 2018-10-24 MED ORDER — COCONUT OIL OIL
1.0000 "application " | TOPICAL_OIL | Status: DC | PRN
  Filled 2018-10-24: qty 120

## 2018-10-24 MED ORDER — ACETAMINOPHEN 500 MG PO TABS
1000.0000 mg | ORAL_TABLET | Freq: Four times a day (QID) | ORAL | Status: AC
  Administered 2018-10-24 – 2018-10-25 (×4): 1000 mg via ORAL
  Filled 2018-10-24 (×4): qty 2

## 2018-10-24 MED ORDER — DIPHENHYDRAMINE HCL 50 MG/ML IJ SOLN
INTRAMUSCULAR | Status: AC
  Filled 2018-10-24: qty 1

## 2018-10-24 MED ORDER — SENNOSIDES-DOCUSATE SODIUM 8.6-50 MG PO TABS
2.0000 | ORAL_TABLET | ORAL | Status: DC
  Administered 2018-10-25 – 2018-10-27 (×3): 2 via ORAL
  Filled 2018-10-24 (×3): qty 2

## 2018-10-24 MED ORDER — PRENATAL MULTIVITAMIN CH
1.0000 | ORAL_TABLET | Freq: Every day | ORAL | Status: DC
  Administered 2018-10-25: 1 via ORAL
  Filled 2018-10-24: qty 1

## 2018-10-24 MED ORDER — NALOXONE HCL 0.4 MG/ML IJ SOLN: 0.4000 mg | INTRAMUSCULAR | Status: DC | PRN

## 2018-10-24 MED ORDER — LACTATED RINGERS IV SOLN
INTRAVENOUS | Status: DC | PRN
  Administered 2018-10-24: 08:00:00 via INTRAVENOUS

## 2018-10-24 MED ORDER — MEPERIDINE HCL 25 MG/ML IJ SOLN
6.2500 mg | INTRAMUSCULAR | Status: DC | PRN
  Administered 2018-10-24: 12.5 mg via INTRAVENOUS

## 2018-10-24 MED ORDER — DIPHENHYDRAMINE HCL 50 MG/ML IJ SOLN: 12.5000 mg | INTRAMUSCULAR | Status: DC | PRN

## 2018-10-24 MED ORDER — PROPOFOL 10 MG/ML IV BOLUS
INTRAVENOUS | Status: AC
  Filled 2018-10-24: qty 20

## 2018-10-24 MED ORDER — OXYTOCIN 10 UNIT/ML IJ SOLN
INTRAMUSCULAR | Status: AC
  Filled 2018-10-24: qty 4

## 2018-10-24 MED ORDER — ACETAMINOPHEN 325 MG PO TABS
650.0000 mg | ORAL_TABLET | ORAL | Status: DC | PRN
  Administered 2018-10-25: 650 mg via ORAL
  Filled 2018-10-24: qty 2

## 2018-10-24 MED ORDER — PHENYLEPHRINE 40 MCG/ML (10ML) SYRINGE FOR IV PUSH (FOR BLOOD PRESSURE SUPPORT)
PREFILLED_SYRINGE | INTRAVENOUS | Status: AC
  Filled 2018-10-24: qty 10

## 2018-10-24 MED ORDER — SCOPOLAMINE 1 MG/3DAYS TD PT72: 1.0000 | MEDICATED_PATCH | Freq: Once | TRANSDERMAL | Status: DC

## 2018-10-24 MED ORDER — ONDANSETRON HCL 4 MG/2ML IJ SOLN
INTRAMUSCULAR | Status: AC
  Filled 2018-10-24: qty 2

## 2018-10-24 MED ORDER — FENTANYL CITRATE (PF) 100 MCG/2ML IJ SOLN
INTRAMUSCULAR | Status: AC
  Filled 2018-10-24: qty 2

## 2018-10-24 MED ORDER — SCOPOLAMINE 1 MG/3DAYS TD PT72
MEDICATED_PATCH | TRANSDERMAL | Status: DC | PRN
  Administered 2018-10-24: 1 via TRANSDERMAL

## 2018-10-24 MED ORDER — METOCLOPRAMIDE HCL 5 MG/ML IJ SOLN
INTRAMUSCULAR | Status: AC
  Filled 2018-10-24: qty 2

## 2018-10-24 MED ORDER — SIMETHICONE 80 MG PO CHEW
80.0000 mg | CHEWABLE_TABLET | Freq: Three times a day (TID) | ORAL | Status: DC
  Administered 2018-10-24 – 2018-10-26 (×7): 80 mg via ORAL
  Filled 2018-10-24 (×6): qty 1

## 2018-10-24 MED ORDER — ONDANSETRON HCL 4 MG/2ML IJ SOLN: 4.0000 mg | Freq: Three times a day (TID) | INTRAMUSCULAR | Status: DC | PRN

## 2018-10-24 MED ORDER — MORPHINE SULFATE (PF) 0.5 MG/ML IJ SOLN
INTRAMUSCULAR | Status: DC | PRN
  Administered 2018-10-24: .15 mg via INTRATHECAL

## 2018-10-24 MED ORDER — PHENYLEPHRINE 8 MG IN D5W 100 ML (0.08MG/ML) PREMIX OPTIME
INJECTION | INTRAVENOUS | Status: DC | PRN
  Administered 2018-10-24: 30 ug/min via INTRAVENOUS

## 2018-10-24 MED ORDER — HYDROMORPHONE HCL 1 MG/ML IJ SOLN: 0.2500 mg | INTRAMUSCULAR | Status: DC | PRN

## 2018-10-24 MED ORDER — DIBUCAINE 1 % RE OINT: 1.0000 "application " | TOPICAL_OINTMENT | RECTAL | Status: DC | PRN

## 2018-10-24 MED ORDER — SIMETHICONE 80 MG PO CHEW
80.0000 mg | CHEWABLE_TABLET | ORAL | Status: DC | PRN
  Filled 2018-10-24: qty 1

## 2018-10-24 MED ORDER — ZOLPIDEM TARTRATE 5 MG PO TABS: 5.0000 mg | ORAL_TABLET | Freq: Every evening | ORAL | Status: DC | PRN

## 2018-10-24 MED ORDER — MEPERIDINE HCL 25 MG/ML IJ SOLN
INTRAMUSCULAR | Status: AC
  Filled 2018-10-24: qty 1

## 2018-10-24 MED ORDER — SODIUM CHLORIDE 0.9% FLUSH: 3.0000 mL | INTRAVENOUS | Status: DC | PRN

## 2018-10-24 MED ORDER — DEXAMETHASONE SODIUM PHOSPHATE 4 MG/ML IJ SOLN
INTRAMUSCULAR | Status: DC | PRN
  Administered 2018-10-24: 4 mg via INTRAVENOUS

## 2018-10-24 MED ORDER — SODIUM CHLORIDE 0.9 % IV SOLN
2.0000 g | INTRAVENOUS | Status: AC
  Administered 2018-10-24: 2 g via INTRAVENOUS
  Filled 2018-10-24: qty 2

## 2018-10-24 MED ORDER — NALOXONE HCL 4 MG/10ML IJ SOLN
1.0000 ug/kg/h | INTRAVENOUS | Status: DC | PRN
  Filled 2018-10-24: qty 5

## 2018-10-24 MED ORDER — WITCH HAZEL-GLYCERIN EX PADS: 1.0000 "application " | MEDICATED_PAD | CUTANEOUS | Status: DC | PRN

## 2018-10-24 MED ORDER — MENTHOL 3 MG MT LOZG: 1.0000 | LOZENGE | OROMUCOSAL | Status: DC | PRN

## 2018-10-24 MED ORDER — PROPOFOL 10 MG/ML IV BOLUS
INTRAVENOUS | Status: DC | PRN
  Administered 2018-10-24: 20 mg via INTRAVENOUS

## 2018-10-24 MED ORDER — NALBUPHINE HCL 10 MG/ML IJ SOLN: 5.0000 mg | INTRAMUSCULAR | Status: DC | PRN

## 2018-10-24 MED ORDER — ONDANSETRON HCL 4 MG/2ML IJ SOLN
INTRAMUSCULAR | Status: DC | PRN
  Administered 2018-10-24: 4 mg via INTRAVENOUS

## 2018-10-24 MED ORDER — MEPERIDINE HCL 25 MG/ML IJ SOLN: 6.2500 mg | INTRAMUSCULAR | Status: DC | PRN

## 2018-10-24 MED ORDER — DIPHENHYDRAMINE HCL 25 MG PO CAPS
25.0000 mg | ORAL_CAPSULE | ORAL | Status: DC | PRN
  Administered 2018-10-25: 25 mg via ORAL
  Filled 2018-10-24: qty 1

## 2018-10-24 MED ORDER — PHENYLEPHRINE 8 MG IN D5W 100 ML (0.08MG/ML) PREMIX OPTIME
INJECTION | INTRAVENOUS | Status: AC
  Filled 2018-10-24: qty 100

## 2018-10-24 MED ORDER — BUPIVACAINE IN DEXTROSE 0.75-8.25 % IT SOLN
INTRATHECAL | Status: DC | PRN
  Administered 2018-10-24: 1.6 mL via INTRATHECAL

## 2018-10-24 MED ORDER — DIPHENHYDRAMINE HCL 50 MG/ML IJ SOLN
INTRAMUSCULAR | Status: DC | PRN
  Administered 2018-10-24: 6.25 mg via INTRAVENOUS

## 2018-10-24 MED ORDER — KETOROLAC TROMETHAMINE 30 MG/ML IJ SOLN
30.0000 mg | Freq: Four times a day (QID) | INTRAMUSCULAR | Status: DC | PRN
  Administered 2018-10-24: 30 mg via INTRAVENOUS
  Filled 2018-10-24: qty 1

## 2018-10-24 SURGICAL SUPPLY — 34 items
BARRIER ADHS 3X4 INTERCEED (GAUZE/BANDAGES/DRESSINGS) IMPLANT
CHLORAPREP W/TINT 26ML (MISCELLANEOUS) ×3 IMPLANT
CLAMP CORD UMBIL (MISCELLANEOUS) IMPLANT
CLOSURE STERI STRIP 1/2 X4 (GAUZE/BANDAGES/DRESSINGS) ×3 IMPLANT
CLOTH BEACON ORANGE TIMEOUT ST (SAFETY) ×3 IMPLANT
DRSG OPSITE POSTOP 4X10 (GAUZE/BANDAGES/DRESSINGS) ×3 IMPLANT
ELECT REM PT RETURN 9FT ADLT (ELECTROSURGICAL) ×3
ELECTRODE REM PT RTRN 9FT ADLT (ELECTROSURGICAL) ×1 IMPLANT
EXTENDER TRAXI PANNICULUS (MISCELLANEOUS) ×1 IMPLANT
EXTRACTOR VACUUM M CUP 4 TUBE (SUCTIONS) IMPLANT
EXTRACTOR VACUUM M CUP 4' TUBE (SUCTIONS)
GLOVE BIO SURGEON STRL SZ 6.5 (GLOVE) ×2 IMPLANT
GLOVE BIO SURGEONS STRL SZ 6.5 (GLOVE) ×1
GLOVE BIOGEL PI IND STRL 7.0 (GLOVE) ×1 IMPLANT
GLOVE BIOGEL PI INDICATOR 7.0 (GLOVE) ×2
GOWN STRL REUS W/TWL LRG LVL3 (GOWN DISPOSABLE) ×6 IMPLANT
KIT ABG SYR 3ML LUER SLIP (SYRINGE) IMPLANT
NEEDLE HYPO 22GX1.5 SAFETY (NEEDLE) IMPLANT
NEEDLE HYPO 25X5/8 SAFETYGLIDE (NEEDLE) ×3 IMPLANT
NS IRRIG 1000ML POUR BTL (IV SOLUTION) ×3 IMPLANT
PACK C SECTION WH (CUSTOM PROCEDURE TRAY) ×3 IMPLANT
PAD OB MATERNITY 4.3X12.25 (PERSONAL CARE ITEMS) ×3 IMPLANT
PENCIL SMOKE EVAC W/HOLSTER (ELECTROSURGICAL) ×3 IMPLANT
SPONGE LAP 18X18 RF (DISPOSABLE) ×9 IMPLANT
SUT CHROMIC 0 CTX 36 (SUTURE) ×6 IMPLANT
SUT PLAIN 0 NONE (SUTURE) IMPLANT
SUT PLAIN 2 0 XLH (SUTURE) IMPLANT
SUT VIC AB 0 CT1 27 (SUTURE) ×6
SUT VIC AB 0 CT1 27XBRD ANBCTR (SUTURE) ×3 IMPLANT
SUT VIC AB 4-0 KS 27 (SUTURE) ×3 IMPLANT
SYR CONTROL 10ML LL (SYRINGE) IMPLANT
TOWEL OR 17X24 6PK STRL BLUE (TOWEL DISPOSABLE) ×3 IMPLANT
TRAXI PANNICULUS EXTENDER (MISCELLANEOUS) ×2
TRAY FOLEY W/BAG SLVR 14FR LF (SET/KITS/TRAYS/PACK) ×3 IMPLANT

## 2018-10-24 NOTE — Anesthesia Preprocedure Evaluation (Addendum)
Anesthesia Evaluation  Patient identified by MRN, date of birth, ID band Patient awake    Reviewed: Allergy & Precautions, H&P , NPO status , Patient's Chart, lab work & pertinent test results  History of Anesthesia Complications (+) PONV and history of anesthetic complications  Airway Mallampati: I  TM Distance: >3 FB Neck ROM: full    Dental no notable dental hx. (+) Teeth Intact   Pulmonary neg pulmonary ROS,    Pulmonary exam normal breath sounds clear to auscultation       Cardiovascular negative cardio ROS Normal cardiovascular exam Rhythm:regular Rate:Normal     Neuro/Psych PSYCHIATRIC DISORDERS Anxiety negative neurological ROS     GI/Hepatic negative GI ROS, Neg liver ROS,   Endo/Other  negative endocrine ROSdiabetes  Renal/GU      Musculoskeletal   Abdominal Normal abdominal exam  (+)   Peds  Hematology  (+) Blood dyscrasia, anemia ,   Anesthesia Other Findings   Reproductive/Obstetrics (+) Pregnancy                            Anesthesia Physical Anesthesia Plan  ASA: II  Anesthesia Plan: Spinal   Post-op Pain Management:    Induction:   PONV Risk Score and Plan: 4 or greater and Ondansetron, Dexamethasone and Scopolamine patch - Pre-op  Airway Management Planned: Natural Airway and Nasal Cannula  Additional Equipment:   Intra-op Plan:   Post-operative Plan:   Informed Consent: I have reviewed the patients History and Physical, chart, labs and discussed the procedure including the risks, benefits and alternatives for the proposed anesthesia with the patient or authorized representative who has indicated his/her understanding and acceptance.     Plan Discussed with: CRNA  Anesthesia Plan Comments:         Anesthesia Quick Evaluation

## 2018-10-24 NOTE — Anesthesia Procedure Notes (Signed)
Spinal  Patient location during procedure: OR Start time: 10/24/2018 7:28 AM End time: 10/24/2018 7:30 AM Staffing Anesthesiologist: Lyn Hollingshead, MD Performed: anesthesiologist  Preanesthetic Checklist Completed: patient identified, site marked, surgical consent, pre-op evaluation, timeout performed, IV checked, risks and benefits discussed and monitors and equipment checked Spinal Block Patient position: sitting Prep: site prepped and draped and DuraPrep Patient monitoring: continuous pulse ox and blood pressure Approach: midline Location: L3-4 Injection technique: single-shot Needle Needle type: Pencan  Needle gauge: 24 G Needle length: 10 cm Needle insertion depth: 6 cm Assessment Sensory level: T4

## 2018-10-24 NOTE — Transfer of Care (Signed)
Immediate Anesthesia Transfer of Care Note  Patient: Sydney Rios  Procedure(s) Performed: REPEAT CESAREAN SECTION (N/A )  Patient Location: PACU  Anesthesia Type:Spinal  Level of Consciousness: awake, alert  and oriented  Airway & Oxygen Therapy: Patient Spontanous Breathing  Post-op Assessment: Report given to RN and Post -op Vital signs reviewed and stable  Post vital signs: Reviewed and stable  Last Vitals:  Vitals Value Taken Time  BP    Temp    Pulse 78 10/24/2018  8:35 AM  Resp    SpO2 99 % 10/24/2018  8:35 AM  Vitals shown include unvalidated device data.  Last Pain:  Vitals:   10/24/18 0604  TempSrc: Oral  PainSc: 0-No pain         Complications: No apparent anesthesia complications

## 2018-10-24 NOTE — Progress Notes (Signed)
H and P on the chart No changes Will proceed with Repeat LTCS Consent signed 

## 2018-10-24 NOTE — Consult Note (Signed)
Neonatology Note:   Attendance at C-section:    I was asked by Dr. Helane Rima to attend this repeat C/S at term. The mother is a G4P3 A pos, GBS neg with known LGA infant, history of GDM in prior pregnancy, history of kidney stones, and anxiety. ROM at delivery, fluid clear. Infant vigorous with good spontaneous cry and tone. Delayed cord clamping was done. Needed only minimal bulb suctioning. Ap 9/9. Lungs clear to ausc in DR. Infant is able to remain with his mother for skin to skin time under nursing supervision. Transferred to the care of Pediatrician.   Real Cons, MD

## 2018-10-24 NOTE — Brief Op Note (Signed)
10/24/2018  8:23 AM  PATIENT:  Sydney Rios  37 y.o. female  PRE-OPERATIVE DIAGNOSIS: IUP at 39 weeks and 1 day Previous C Section x 3   POST-OPERATIVE DIAGNOSIS:  Same  PROCEDURE:  Procedure(s) with comments: REPEAT CESAREAN SECTION (N/A) - Tracey RNFA  SURGEON:  Surgeon(s) and Role:    * Dian Queen, MD - Primary  PHYSICIAN ASSISTANT:   ASSISTANTS: none   ANESTHESIA:   spinal  EBL:  183 mL   BLOOD ADMINISTERED:none  DRAINS: Urinary Catheter (Foley)   LOCAL MEDICATIONS USED:  NONE  SPECIMEN:  No Specimen  DISPOSITION OF SPECIMEN:  N/A  COUNTS:  YES  TOURNIQUET:  * No tourniquets in log *  DICTATION: .Other Dictation: Dictation Number dictated  PLAN OF CARE: Admit to inpatient   PATIENT DISPOSITION:  PACU - hemodynamically stable.   Delay start of Pharmacological VTE agent (>24hrs) due to surgical blood loss or risk of bleeding: yes

## 2018-10-24 NOTE — Anesthesia Postprocedure Evaluation (Signed)
Anesthesia Post Note  Patient: Sydney Rios  Procedure(s) Performed: REPEAT CESAREAN SECTION (N/A )     Patient location during evaluation: PACU Anesthesia Type: Spinal Level of consciousness: awake Pain management: pain level controlled Vital Signs Assessment: post-procedure vital signs reviewed and stable Respiratory status: spontaneous breathing Cardiovascular status: stable Postop Assessment: no headache, no backache, spinal receding, patient able to bend at knees and no apparent nausea or vomiting Anesthetic complications: no    Last Vitals:  Vitals:   10/24/18 1000 10/24/18 1015  BP: 133/78 126/63  Pulse: (!) 47 (!) 52  Resp: (!) 9 17  Temp:    SpO2: 98% 100%    Last Pain:  Vitals:   10/24/18 1000  TempSrc:   PainSc: 0-No pain   Pain Goal:                 Huston Foley

## 2018-10-24 NOTE — Lactation Note (Signed)
This note was copied from a baby's chart. Lactation Consultation Note  Patient Name: Sydney Rios [id number] Date: 10/24/2018 Reason for consult: Initial assessment;Maternal endocrine disorder;Term Type of Endocrine Disorder?: Diabetes  Visited with P4 Mom of term baby at 26 hrs old.  Mom feeling nauseated.  Baby swaddled and being held by family.  Mom knows to keep baby STS as much as possible, when she is feeling better.  Mom experienced breastfeeding her 3 prior children.    Lactation brochure left with Mom in room.  To ask for assistance prn.  Baby latched in PACU.    Maternal Data Formula Feeding for Exclusion: No Does the patient have breastfeeding experience prior to this delivery?: Yes  Feeding Feeding Type: Breast Fed  LATCH Score Latch: Grasps breast easily, tongue down, lips flanged, rhythmical sucking.  Audible Swallowing: A few with stimulation  Type of Nipple: Everted at rest and after stimulation  Comfort (Breast/Nipple): Soft / non-tender  Hold (Positioning): No assistance needed to correctly position infant at breast.  LATCH Score: 9  Interventions Interventions: Breast feeding basics reviewed;Skin to skin;Breast massage;Hand express   Consult Status Consult Status: Follow-up Date: 10/25/18 Follow-up type: In-patient    Broadus John 10/24/2018, 11:54 AM

## 2018-10-25 LAB — CBC
HCT: 28.5 % — ABNORMAL LOW (ref 36.0–46.0)
Hemoglobin: 9.3 g/dL — ABNORMAL LOW (ref 12.0–15.0)
MCH: 27.3 pg (ref 26.0–34.0)
MCHC: 32.6 g/dL (ref 30.0–36.0)
MCV: 83.6 fL (ref 80.0–100.0)
PLATELETS: 167 10*3/uL (ref 150–400)
RBC: 3.41 MIL/uL — AB (ref 3.87–5.11)
RDW: 14.7 % (ref 11.5–15.5)
WBC: 9.6 10*3/uL (ref 4.0–10.5)
nRBC: 0 % (ref 0.0–0.2)

## 2018-10-25 LAB — BIRTH TISSUE RECOVERY COLLECTION (PLACENTA DONATION)

## 2018-10-25 MED ORDER — OXYCODONE HCL 5 MG PO TABS
5.0000 mg | ORAL_TABLET | ORAL | Status: DC | PRN
  Administered 2018-10-25: 10 mg via ORAL
  Administered 2018-10-25: 5 mg via ORAL
  Administered 2018-10-25: 10 mg via ORAL
  Administered 2018-10-26: 5 mg via ORAL
  Administered 2018-10-26 (×2): 10 mg via ORAL
  Administered 2018-10-26: 5 mg via ORAL
  Administered 2018-10-27 (×2): 10 mg via ORAL
  Filled 2018-10-25: qty 2
  Filled 2018-10-25: qty 1
  Filled 2018-10-25 (×4): qty 2
  Filled 2018-10-25 (×2): qty 1
  Filled 2018-10-25: qty 2

## 2018-10-25 MED ORDER — OXYCODONE HCL 5 MG PO TABS: 5.0000 mg | ORAL_TABLET | ORAL | Status: DC | PRN

## 2018-10-25 NOTE — Anesthesia Postprocedure Evaluation (Signed)
Anesthesia Post Note  Patient: Sydney Rios  Procedure(s) Performed: REPEAT CESAREAN SECTION (N/A )     Patient location during evaluation: Mother Baby Anesthesia Type: Spinal Level of consciousness: awake, awake and alert and oriented Pain management: pain level controlled Vital Signs Assessment: post-procedure vital signs reviewed and stable Respiratory status: spontaneous breathing, nonlabored ventilation and respiratory function stable Cardiovascular status: stable Postop Assessment: no headache, no backache, no apparent nausea or vomiting, adequate PO intake, able to ambulate and patient able to bend at knees Anesthetic complications: no    Last Vitals:  Vitals:   10/25/18 0200 10/25/18 0600  BP: 118/61 100/60  Pulse: (!) 49 (!) 54  Resp: 18 16  Temp: 36.5 C 36.9 C  SpO2: 99% 100%    Last Pain:  Vitals:   10/25/18 0600  TempSrc: Oral  PainSc: 6    Pain Goal:                 Sydney Rios

## 2018-10-25 NOTE — Progress Notes (Signed)
Subjective: Postpartum Day 1: Cesarean Delivery Patient reports incisional pain, tolerating PO and no problems voiding.  Pt declines vit K injection therefore circumcision not scheduled.    Objective: Vital signs in last 24 hours: Temp:  [97.6 F (36.4 C)-98.7 F (37.1 C)] 98.5 F (36.9 C) (10/30 0600) Pulse Rate:  [47-78] 54 (10/30 0600) Resp:  [9-26] 16 (10/30 0600) BP: (100-134)/(60-96) 100/60 (10/30 0600) SpO2:  [97 %-100 %] 100 % (10/30 0600)  Physical Exam:  General: alert, Lochia: appropriate Uterine Fundus: firm Incision: healing well, no significant drainage DVT Evaluation: No evidence of DVT seen on physical exam.  Recent Labs    10/23/18 1245 10/25/18 0515  HGB 9.9* 9.3*  HCT 30.4* 28.5*    Assessment/Plan: Status post Cesarean section. Doing well postoperatively.  Continue current care.  Sydney Rios 10/25/2018, 8:21 AM

## 2018-10-26 LAB — URINALYSIS, ROUTINE W REFLEX MICROSCOPIC
Bacteria, UA: NONE SEEN
Bilirubin Urine: NEGATIVE
GLUCOSE, UA: NEGATIVE mg/dL
Ketones, ur: NEGATIVE mg/dL
Leukocytes, UA: NEGATIVE
NITRITE: NEGATIVE
PH: 7 (ref 5.0–8.0)
Protein, ur: NEGATIVE mg/dL
RBC / HPF: >50 RBC/hpf — ABNORMAL HIGH (ref 0–5)
SPECIFIC GRAVITY, URINE: 1.004 — AB (ref 1.005–1.030)

## 2018-10-26 NOTE — Addendum Note (Signed)
Addendum  created 10/26/18 1052 by Lyn Hollingshead, MD   Intraprocedure Staff edited

## 2018-10-26 NOTE — Lactation Note (Signed)
This note was copied from a baby's chart. Lactation Consultation Note  Patient Name: Sydney Rios [id number] Date: 10/26/2018 Reason for consult: Follow-up assessment;Term P4, 62 hour old female infant. LC entered room mom in shower , per dad BF is going well. Mom latched infant to breast for 30 minutes at 8:30 pm. LC mention it will soon be time for another feeding. LC did not observe latch at this time.  LC wanted to  follow up with BF, and ask Dad to ask mom if she has any questions or concerns. Dad repeated  said, " all is well with BF" . He will inform mom, to call nurse or LC if she had any concerns or questions.    Maternal Data    Feeding Feeding Type: Breast Fed  LATCH Score                   Interventions    Lactation Tools Discussed/Used     Consult Status Consult Status: Follow-up Date: 09/27/19 Follow-up type: In-patient    Vicente Serene 10/26/2018, 11:45 PM

## 2018-10-26 NOTE — Lactation Note (Signed)
This note was copied from a baby's chart. Lactation Consultation Note Baby 47 hrs old. Mom stated baby BF a lot during the day yesterday but didn't feed all night. Baby slept and was spitting up all night. Baby had large void diaper, LC changed. Mom had baby sitting upright in bopy, baby started choking. LC used suction bulb to clear mouth. Abd. Distended.  Parents stated baby had a lot of stools. Mom placed baby on her chest. Discussed monitoring for hypoglycemia since mom GDM, large baby, and not presently feeding. Mom has DEBP at bedside, has pumped and stored colostrum in refrigerator.  Encouraged mom to call for assistance if needed.  Patient Name: Sydney RiosJ Date: 10/26/2018 Reason for consult: Follow-up assessment;Maternal endocrine disorder Type of Endocrine Disorder?: Diabetes   Maternal Data    Feeding    LATCH Score Latch: Too sleepy or reluctant, no latch achieved, no sucking elicited.        Comfort (Breast/Nipple): Soft / non-tender        Interventions Interventions: Breast feeding basics reviewed;Support pillows;Skin to skin;DEBP  Lactation Tools Discussed/Used WIC Program: No Pump Review: Setup, frequency, and cleaning;Milk Storage Initiated by:: RN Date initiated:: 10/25/18   Consult Status Consult Status: Follow-up Date: 10/26/18(in pm) Follow-up type: In-patient    Theodoro Kalata 10/26/2018, 4:48 AM

## 2018-10-26 NOTE — Lactation Note (Signed)
This note was copied from a baby's chart. Lactation Consultation Note  Patient Name: Sydney Rios [id number] Date: 10/26/2018 Reason for consult: Follow-up assessment Baby sleepy and last good feeding was 6 hours ago.  Instructed to get skin to skin with baby to encourage feeding.  Mom has symphony pump set up at bedside and she pumped once this morning and obtained drops.  Instructed to pump every 2-3 hours.  Encouraged to call for assist prn.  Maternal Data    Feeding    LATCH Score                   Interventions    Lactation Tools Discussed/Used     Consult Status Consult Status: Follow-up Date: 10/27/18 Follow-up type: In-patient    Ave Filter 10/26/2018, 2:18 PM

## 2018-10-26 NOTE — Progress Notes (Signed)
Due to high CSW's high census and acuity, CSW is unable to meet with MOB to complete assessment for ' Edinburgh score of 10.  PMAD education will be provided during discharge teaching.   Please consult CSW if concerns arise or by MOB's request.  Laurey Arrow, MSW, LCSW Clinical Social Work [phone]

## 2018-10-26 NOTE — Progress Notes (Signed)
Patient is doing well. BP (!) 109/58 (BP Location: Right Arm)   Pulse (!) 55   Temp (!) 97.3 F (36.3 C) (Oral)   Resp 18   Ht 5\' 7"  (1.702 m)   Wt 88.9 kg   LMP 01/23/2018   SpO2 100%   Breastfeeding? Unknown   BMI 30.70 kg/m  No results found for this or any previous visit (from the past 24 hour(s)). Abdomen soft and non tender  POD # 2  Doing well Routine care Possible discharge later today

## 2018-10-26 NOTE — Op Note (Signed)
NAME: Sydney Rios, Sydney Rios MEDICAL RECORD [id number] ACCOUNT [id number] DATE OF BIRTH:[birth year] FACILITY: [facility] LOCATION: [id number] PHYSICIAN:Sheridan Hew Lynett Fish, MD  OPERATIVE REPORT  DATE OF PROCEDURE:  10/24/2018  PREOPERATIVE DIAGNOSIS:  Intrauterine pregnancy at 39 weeks and 1 day, previous cesarean section x3.  POSTOPERATIVE DIAGNOSIS:  pregnancy at 39 weeks and 1 day, previous cesarean section x3.  PROCEDURE:  Repeat low transverse cesarean section.  SURGEON:  Dian Queen, MD  ANESTHESIA:  Spinal.  ESTIMATED BLOOD LOSS:  Less than 200.  COMPLICATIONS:  None.  DRAINS:  Foley catheter.  PATHOLOGY:  None.   DESCRIPTION OF PROCEDURE:  The patient was taken to the operating room.  Spinal was placed without incident.  She was prepped and draped in the usual sterile fashion.  Foley catheter was inserted and a timeout was performed.  A low transverse incision  was made at the area of the previous C-section scar, carried down to the fascia.  Fascia was scored in the midline and extended laterally.  Rectus muscles were separated in the midline.  The peritoneum was entered bluntly and the peritoneal incision was  then stretched.  The lower uterine segment was identified and the bladder flap was created sharply and then digitally.  The bladder blade was then readjusted.  A low transverse incision was made on the uterus.  The uterus was entered using a hemostat and  the amniotic fluid was clear.  The baby was in cephalic presentation, was delivered easily and was large for gestational age, was a female infant, Apgars 9 at 1 minute and 9 at 5 minutes.  The cord was clamped and cut.  The baby was handed to the waiting  pediatricians.  The placenta was manually removed and noted to be normal and intact with three vessel cord.  The uterus was exteriorized and cleared of all clots and debris.  The uterine incision was closed in 1 layer using 0 chromic in a running locked  stitch.  The uterus  was returned to the abdomen.  Irrigation was performed.  Hemostasis was very good.  The peritoneum was closed using 0 Vicryl and the fascia was closed using 0 Vicryl in a running stitch starting in each corner and meeting in the  midline.  After irrigation of subcutaneous layer, the skin was closed with 3-0 Vicryl on a Keith needle.  All sponge, lap and instrument counts were correct x2.  The patient went to recovery room in stable condition.  TN/NUANCE  D:10/26/2018 T:10/26/2018 JOB:[id number]

## 2018-10-27 MED ORDER — IBUPROFEN 600 MG PO TABS: 600.0000 mg | ORAL_TABLET | Freq: Four times a day (QID) | ORAL | 0 refills | Status: DC

## 2018-10-27 MED ORDER — OXYCODONE-ACETAMINOPHEN 5-325 MG PO TABS: 1.0000 | ORAL_TABLET | Freq: Four times a day (QID) | ORAL | 0 refills | Status: DC | PRN

## 2018-10-27 NOTE — Discharge Summary (Signed)
Obstetric Discharge Summary Reason for Admission: cesarean section Prenatal Procedures: none Intrapartum Procedures: cesarean: low cervical, transverse Postpartum Procedures: none Complications-Operative and Postpartum: none Hemoglobin  Date Value Ref Range Status  10/25/2018 9.3 (L) 12.0 - 15.0 g/dL Final   HCT  Date Value Ref Range Status  10/25/2018 28.5 (L) 36.0 - 46.0 % Final    Physical Exam:  General: alert Lochia: appropriate Uterine Fundus: firm Incision: healing well DVT Evaluation: No evidence of DVT seen on physical exam.  Discharge Diagnoses: Term Pregnancy-delivered  Discharge Information: Date: 10/27/2018 Activity: pelvic rest Diet: routine Medications: PNV, Ibuprofen and Percocet Condition: stable Instructions: refer to practice specific booklet Discharge to: home [place], [facility] Schedule an appointment as soon as possible for a visit in 1 week(s).   Contact information: [address] [place] [address] [phone]           Newborn Data: Live born female  Birth Weight: 10 lb 14.6 oz (4950 g) APGAR: 27, 9  Newborn Delivery   Birth date/time:  10/24/2018 07:54:00 Delivery type:  C-Section, Vacuum Assisted Trial of labor:  No C-section categorization:  Repeat     Home with mother.  [place] 10/27/2018, 8:27 AM

## 2018-10-27 NOTE — Lactation Note (Signed)
This note was copied from a baby's chart. Lactation Consultation Note  Patient Name: Sydney Rios [id number] Date: 10/27/2018 Reason for consult: Follow-up assessment;Infant weight loss  70 hr female infant LGA wiot Per mom, she had feed infant prior to [facility] entering room. LC have not observed latch with mom. Infant still cuing,  LC ask mom pump she used cylinder hand pump and  easily expressed 30 ml of EBM that dad finger feed with curve tip syringe to infant.  Per mom, breast are sore LC gave mom comfort gels and explained how to use. Per mom, she had an  oversupply with first child and  she oesn't want to use DEBP. Mom's plan: 1. Mom will BF infant first.  2. Mom will hand express or use hand pump  after feeding infant to breast and give back EBM to help with weight gain. Maternal Data    Feeding Feeding Type: Breast Fed  LATCH Score                   Interventions    Lactation Tools Discussed/Used Tools: Pump Breast pump type: Manual   Consult Status      Vicente Serene 10/27/2018, 6:25 AM

## 2019-06-18 ENCOUNTER — Other Ambulatory Visit: Payer: Self-pay | Admitting: General Surgery

## 2019-06-18 DIAGNOSIS — K432 Incisional hernia without obstruction or gangrene: Secondary | ICD-10-CM

## 2019-07-06 ENCOUNTER — Other Ambulatory Visit: Payer: Self-pay | Admitting: General Surgery

## 2019-07-06 ENCOUNTER — Ambulatory Visit
Admission: RE | Admit: 2019-07-06 | Discharge: 2019-07-06 | Disposition: A | Discharge status: Home / Self Care | Payer: BC Managed Care – PPO | Source: Ambulatory Visit | Attending: General Surgery | Admitting: General Surgery

## 2019-07-06 DIAGNOSIS — K432 Incisional hernia without obstruction or gangrene: Secondary | ICD-10-CM

## 2019-07-06 IMAGING — CT CT ABDOMEN AND PELVIS WITHOUT CONTRAST
2 of 4 series · 11 of 46 positions shown, 12 images · non-contrast
Comparison: [DATE]

CLINICAL DATA: Incisional hernia, history of C-section

EXAM:
CT ABDOMEN AND PELVIS WITHOUT CONTRAST
TECHNIQUE: Multidetector CT imaging of the abdomen and pelvis was performed
following the standard protocol without IV contrast. Oral enteric
contrast was administered.

[Series 2: routine abdomen pelvis without 5.00 br40 s3 axial · axial · non-contrast · 0.51mm/px · z∈[+1107,+1472]mm · 8 of 89 slices shown, 9 images]
[im 8/89  soft-tissue]
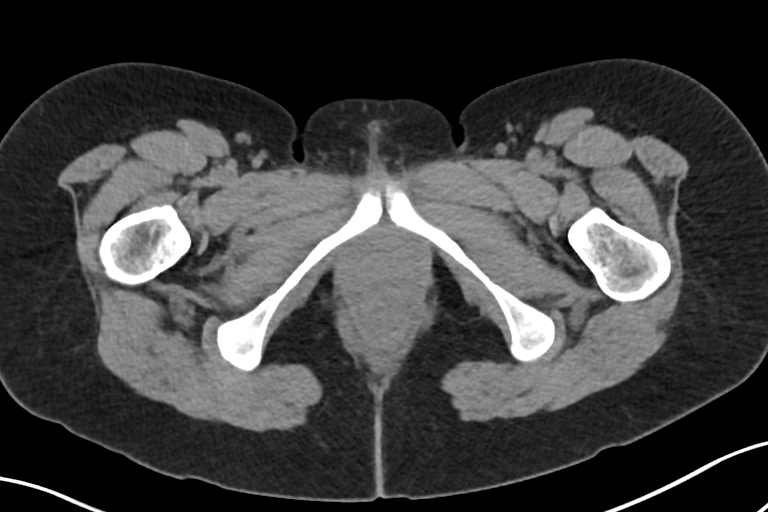
[im 8/89  bone]
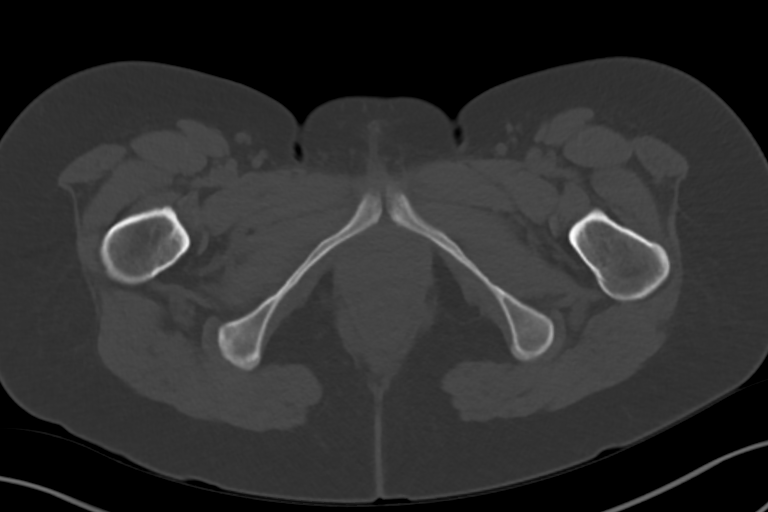
[im 18/89  soft-tissue]
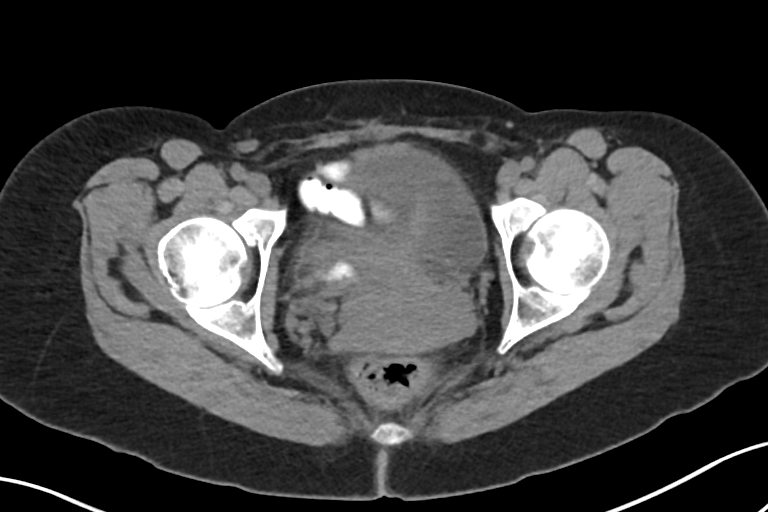
[im 29/89  soft-tissue]
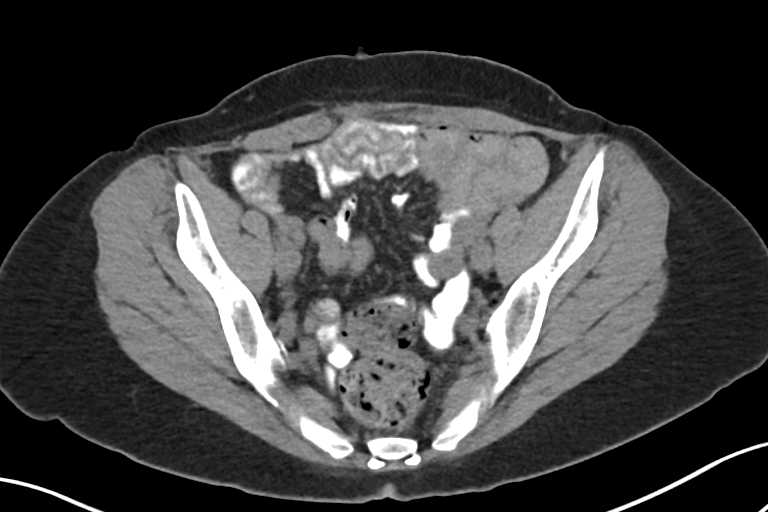
[im 39/89  soft-tissue]
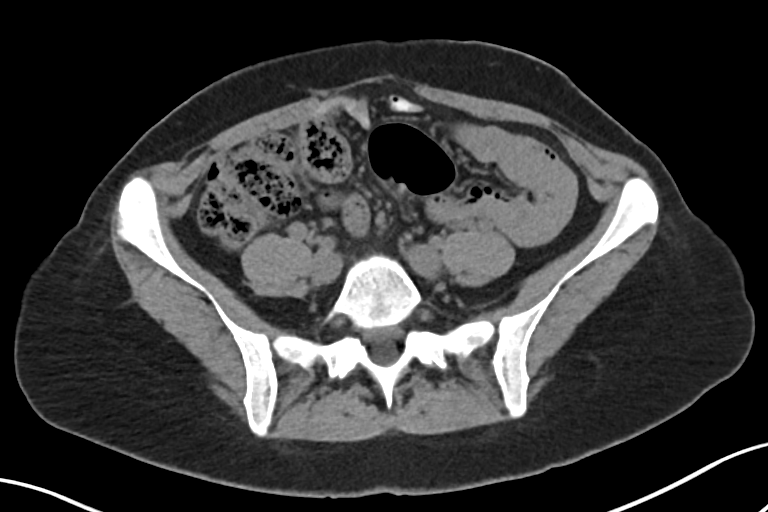
[im 50/89  soft-tissue]
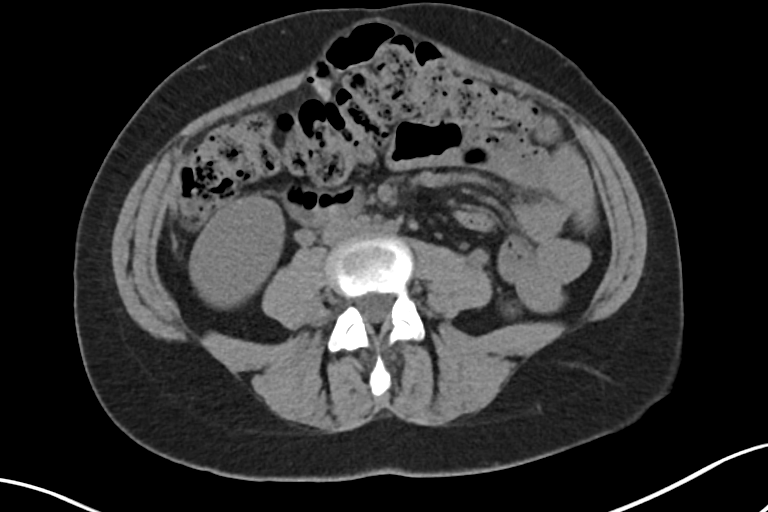
[im 60/89  soft-tissue]
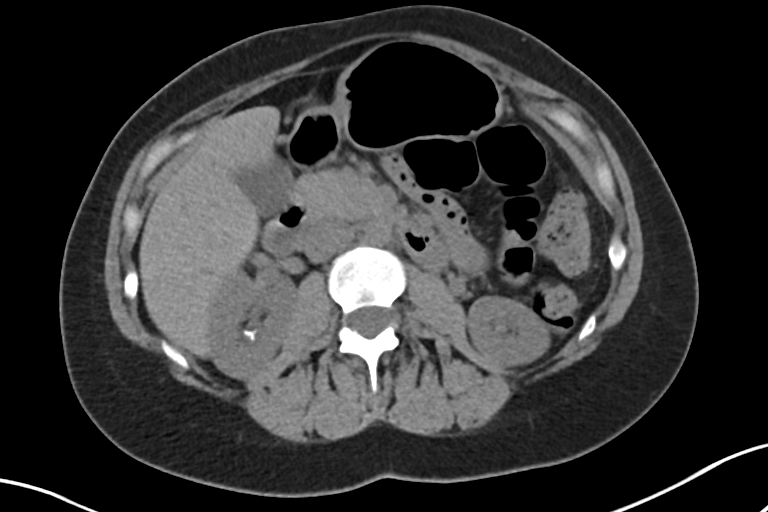
[im 71/89  soft-tissue]
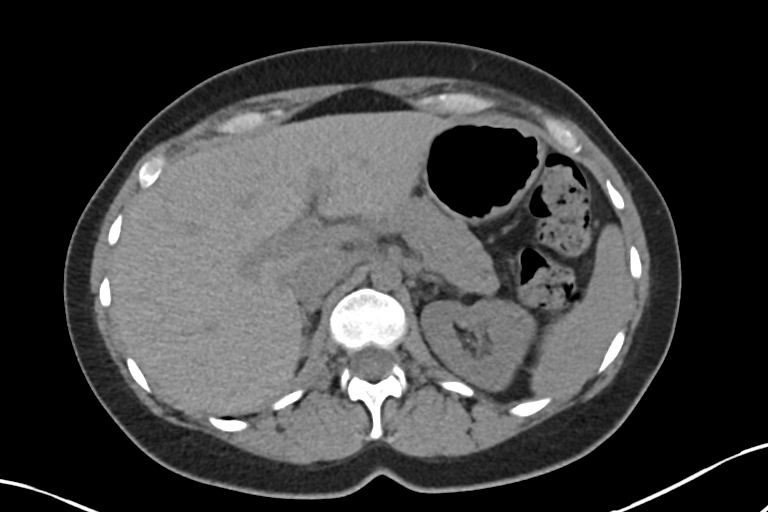
[im 81/89  soft-tissue]
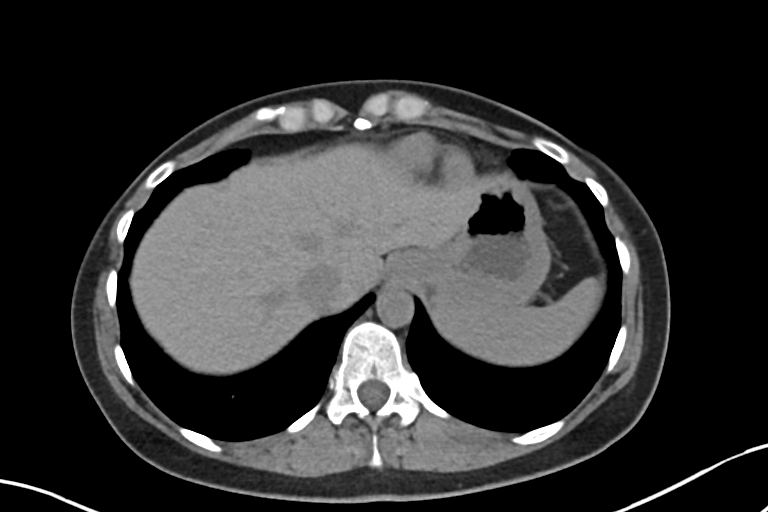

[Series 4: routine abdomen pelvis without 2.00 br40 s3 cor · coronal · non-contrast · 0.77mm/px · 3 of 130 slices shown]
[im 44/130  soft-tissue]
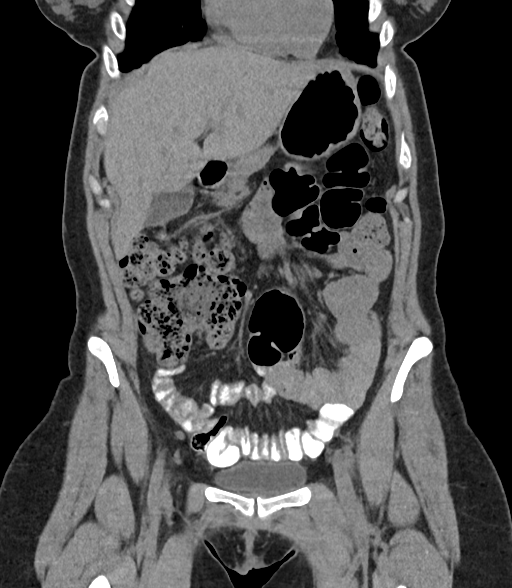
[im 58/130  soft-tissue]
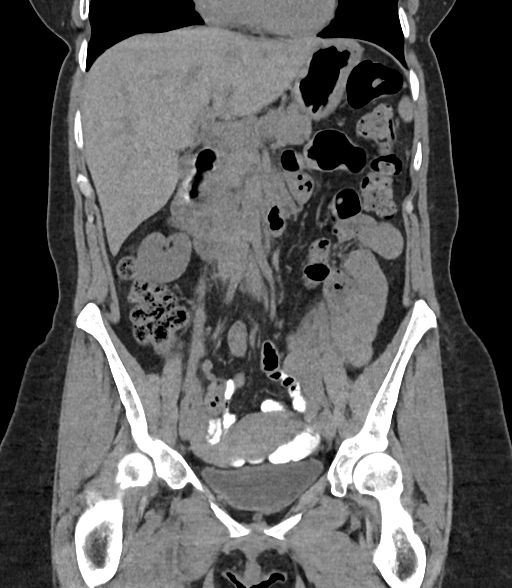
[im 72/130  soft-tissue]
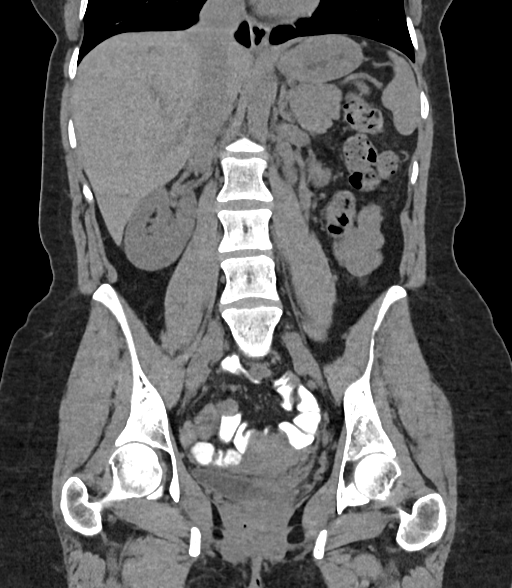

[11 of 46 positions shown; findings below may reference images not displayed]

FINDINGS: Lower chest: No acute abnormality.

Hepatobiliary: No solid liver abnormality is seen. No gallstones,
gallbladder wall thickening, or biliary dilatation.

Pancreas: Unremarkable. No pancreatic ductal dilatation or
surrounding inflammatory changes.

Spleen: Normal in size without significant abnormality.

Adrenals/Urinary Tract: Adrenal glands are unremarkable.
Nonobstructive bilateral nephrolithiasis, calculi larger and more
numerous in the right kidney. No hydronephrosis. No bladder is
unremarkable.

Stomach/Bowel: Stomach is within normal limits. Appendix appears
normal. No evidence of bowel wall thickening, distention, or
inflammatory changes.

Vascular/Lymphatic: No significant vascular findings are present. No
enlarged abdominal or pelvic lymph nodes.

Reproductive: No mass or other significant abnormality.

Other: There is diastasis rectus and a broad-based midline ventral
hernia (series 2, image 38). There is a small low midline hernia
component in the vicinity of prior low transverse incision which
contains a partial, nonobstructed loop of mid small bowel (series 2,
image 65, series 6, image 96), measuring approximately 1.5 cm. No
abdominopelvic ascites.

Musculoskeletal: No acute or significant osseous findings.
IMPRESSION: 1. There is diastasis rectus and a broad-based midline ventral
hernia (series 2, image 38). There is a small low midline hernia
component in the vicinity of prior low transverse incision which
contains a partial, nonobstructed loop of mid small bowel (series 2,
image 65, series 6, image 96), measuring approximately 1.5 cm.

2. Nonobstructive bilateral nephrolithiasis, calculi larger and more
numerous in the right kidney. No hydronephrosis.

## 2019-07-06 MED ORDER — IOPAMIDOL (ISOVUE-300) INJECTION 61%
100.0000 mL | Freq: Once | INTRAVENOUS | Status: AC | PRN
  Administered 2019-07-06: 100 mL via INTRAVENOUS

## 2019-08-03 ENCOUNTER — Other Ambulatory Visit: Payer: Self-pay | Admitting: General Surgery

## 2019-09-20 ENCOUNTER — Encounter (HOSPITAL_BASED_OUTPATIENT_CLINIC_OR_DEPARTMENT_OTHER): Payer: Self-pay | Admitting: *Deleted

## 2019-09-20 ENCOUNTER — Other Ambulatory Visit: Payer: Self-pay

## 2019-09-24 ENCOUNTER — Inpatient Hospital Stay (HOSPITAL_COMMUNITY)
Admission: RE | Admit: 2019-09-24 | Discharge: 2019-09-24 | Disposition: A | Discharge status: Home / Self Care | Payer: BC Managed Care – PPO | Source: Ambulatory Visit

## 2019-09-24 NOTE — Progress Notes (Signed)
Message left for patient about missed covid testing appointment, call back number left

## 2019-09-26 NOTE — Progress Notes (Signed)
Pt has not yet gone for covid test required for surgery tomorrow with Dr Donne Hazel. VM full. LVM with her husband Richardson Landry asking for call back asap or a call to Dr. Donne Hazel if pt is no longer intending on having surgery.  Abigail Butts at Dr. Cristal Generous office aware of above.

## 2019-09-27 ENCOUNTER — Ambulatory Visit (HOSPITAL_BASED_OUTPATIENT_CLINIC_OR_DEPARTMENT_OTHER): Admission: RE | Admit: 2019-09-27 | Payer: BC Managed Care – PPO | Source: Home / Self Care | Admitting: General Surgery

## 2019-09-27 HISTORY — DX: Personal history of urinary calculi: Z87.442

## 2019-09-27 HISTORY — DX: Personal history of gestational diabetes: Z86.32

## 2019-09-27 SURGERY — REPAIR, HERNIA, UMBILICAL, ADULT
Anesthesia: General

## 2019-11-12 ENCOUNTER — Encounter (HOSPITAL_BASED_OUTPATIENT_CLINIC_OR_DEPARTMENT_OTHER): Payer: Self-pay | Admitting: *Deleted

## 2019-11-12 ENCOUNTER — Other Ambulatory Visit: Payer: Self-pay

## 2019-11-14 ENCOUNTER — Other Ambulatory Visit: Payer: Self-pay | Admitting: General Surgery

## 2019-11-15 ENCOUNTER — Other Ambulatory Visit (HOSPITAL_COMMUNITY)
Admission: RE | Admit: 2019-11-15 | Discharge: 2019-11-15 | Disposition: A | Discharge status: Home / Self Care | Payer: BC Managed Care – PPO | Source: Ambulatory Visit | Attending: General Surgery | Admitting: General Surgery

## 2019-11-15 DIAGNOSIS — Z01812 Encounter for preprocedural laboratory examination: Secondary | ICD-10-CM | POA: Diagnosis not present

## 2019-11-15 DIAGNOSIS — Z20828 Contact with and (suspected) exposure to other viral communicable diseases: Secondary | ICD-10-CM | POA: Insufficient documentation

## 2019-11-15 LAB — SARS CORONAVIRUS 2 (TAT 6-24 HRS): SARS Coronavirus 2: NEGATIVE

## 2019-11-15 NOTE — Progress Notes (Signed)

## 2019-11-19 ENCOUNTER — Ambulatory Visit (HOSPITAL_COMMUNITY): Payer: BC Managed Care – PPO | Admitting: Certified Registered"

## 2019-11-19 ENCOUNTER — Encounter (HOSPITAL_BASED_OUTPATIENT_CLINIC_OR_DEPARTMENT_OTHER)
Admission: RE | Disposition: A | Discharge status: Home / Self Care | Payer: Self-pay | Source: Home / Self Care | Attending: General Surgery

## 2019-11-19 ENCOUNTER — Encounter (HOSPITAL_BASED_OUTPATIENT_CLINIC_OR_DEPARTMENT_OTHER): Payer: Self-pay

## 2019-11-19 ENCOUNTER — Other Ambulatory Visit: Payer: Self-pay

## 2019-11-19 ENCOUNTER — Ambulatory Visit (HOSPITAL_BASED_OUTPATIENT_CLINIC_OR_DEPARTMENT_OTHER)
Admission: RE | Admit: 2019-11-19 | Discharge: 2019-11-19 | Disposition: A | Discharge status: Home / Self Care | Payer: BC Managed Care – PPO | Attending: General Surgery | Admitting: General Surgery

## 2019-11-19 DIAGNOSIS — K432 Incisional hernia without obstruction or gangrene: Secondary | ICD-10-CM | POA: Diagnosis not present

## 2019-11-19 DIAGNOSIS — M6208 Separation of muscle (nontraumatic), other site: Secondary | ICD-10-CM | POA: Diagnosis not present

## 2019-11-19 DIAGNOSIS — Z538 Procedure and treatment not carried out for other reasons: Secondary | ICD-10-CM | POA: Diagnosis not present

## 2019-11-19 DIAGNOSIS — Z87442 Personal history of urinary calculi: Secondary | ICD-10-CM | POA: Insufficient documentation

## 2019-11-19 DIAGNOSIS — Z79899 Other long term (current) drug therapy: Secondary | ICD-10-CM | POA: Insufficient documentation

## 2019-11-19 HISTORY — DX: Other complications of anesthesia, initial encounter: T88.59XA

## 2019-11-19 LAB — POCT PREGNANCY, URINE: Preg Test, Ur: NEGATIVE

## 2019-11-19 SURGERY — REPAIR, HERNIA, UMBILICAL, ADULT
Anesthesia: General

## 2019-11-19 MED ORDER — CEFAZOLIN SODIUM-DEXTROSE 2-4 GM/100ML-% IV SOLN: 2.0000 g | INTRAVENOUS | Status: DC

## 2019-11-19 MED ORDER — ENSURE PRE-SURGERY PO LIQD: 296.0000 mL | Freq: Once | ORAL | Status: DC

## 2019-11-19 MED ORDER — KETOROLAC TROMETHAMINE 15 MG/ML IJ SOLN: 15.0000 mg | INTRAMUSCULAR | Status: DC

## 2019-11-19 MED ORDER — LACTATED RINGERS IV SOLN: INTRAVENOUS | Status: DC

## 2019-11-19 MED ORDER — FENTANYL CITRATE (PF) 100 MCG/2ML IJ SOLN
INTRAMUSCULAR | Status: AC
  Filled 2019-11-19: qty 2

## 2019-11-19 MED ORDER — PROPOFOL 10 MG/ML IV BOLUS
INTRAVENOUS | Status: AC
  Filled 2019-11-19: qty 20

## 2019-11-19 MED ORDER — ACETAMINOPHEN 500 MG PO TABS: 1000.0000 mg | ORAL_TABLET | ORAL | Status: DC

## 2019-11-19 MED ORDER — MIDAZOLAM HCL 2 MG/2ML IJ SOLN
INTRAMUSCULAR | Status: AC
  Filled 2019-11-19: qty 2

## 2019-11-19 MED ORDER — GABAPENTIN 100 MG PO CAPS: 100.0000 mg | ORAL_CAPSULE | ORAL | Status: DC

## 2019-11-19 MED ORDER — FENTANYL CITRATE (PF) 100 MCG/2ML IJ SOLN: 50.0000 ug | INTRAMUSCULAR | Status: DC | PRN

## 2019-11-19 MED ORDER — MIDAZOLAM HCL 2 MG/2ML IJ SOLN: 1.0000 mg | INTRAMUSCULAR | Status: DC | PRN

## 2019-11-19 NOTE — H&P (Signed)
Sydney Rios is an 38 y.o. female.   Chief Complaint:  HPI: 35 yof referred by Sydney Sydney Rios for possible incisional hernia that I saw a month or so ago. she is otherwise healthy. she has had four c/sections. last was in october. since then she has noted a rlq abdominal bulge. during pregnancy she also thought she might have had an umbilical hernia as well. this has resolved since delivery. the rlq area is getting bigger it does bother her. she has seen Sydney Rios for possible abdominoplasty and diastasis repair. she would like to consider hernia repair. no issues with bms, n/v. working with pt on her core and pelvic pt. she underwent ct scan that shows a diastasis, possibly small umbo hernia and incisional hernia in rlq with small bowel. she has also seen Sydney Rios to discuss possible abdominoplasty. she returns today to discuss options.   Medication History Sydney Rios, CMA; 07/23/2019 2:29 PM) Medications Reconciled  Vitals (Sydney Rios CMA; 07/23/2019 2:29 PM) 07/23/2019 2:29 PM Weight: 156 lb Height: 71in Body Surface Area: 1.9 m Body Mass Index: 21.76 kg/m  BP: 122/82 (Sitting, Left Arm, Standard)       Physical Exam Sydney Bookbinder MD; 07/25/2019 2:08 PM) Abdomen Note: diastasis, no definite umbo hernia on exam rlq defect nontender     Assessment & Plan Sydney Bookbinder MD; 07/25/2019 2:10 PM) Sydney Rios HERNIA (K43.2) Story: I think that rlq hernia needs to be repaired. ideally it would be done along with plastic surgery to do abdominoplasty to get rid of excess skin and fix diastasis. she is thin and good candidate for this. may be able to fix umbo and rlq hernias primarily understanding higher recurence risk for incisional hernia but to avoid mesh repair in young woman. certainly if anything above 1.5 cm would place mesh in that defect. she is going to consider. I will see if Sydney Rios able to do this at one of surgical centers or hospital. DIASTASIS  RECTI (M62.08)   She was scheduled today without abdominoplasty. This changes my plan. I told her I would like to cancel today and readdress this. I discussed this with she and her husband Past Medical History:  Diagnosis Date  . Anxiety   . Chronic kidney disease    hx kidney stones  . Complication of anesthesia    runs heart rate in the 30-40s in PACU  . History of kidney stones   . Hx gestational diabetes   . Hx of Hashimoto thyroiditis   . No pertinent past medical history   . PONV (postoperative nausea and vomiting)     Past Surgical History:  Procedure Laterality Date  . CESAREAN SECTION    . CESAREAN SECTION  01/23/2013   Procedure: CESAREAN SECTION;  Surgeon: Sydney Lex, MD;  Location: [facility];  Service: Obstetrics;  Laterality: N/A;  . CESAREAN SECTION N/A 10/11/2014   Procedure: CESAREAN SECTION;  Surgeon: Sydney Mourning, MD;  Location: [facility];  Service: Obstetrics;  Laterality: N/A;  . CESAREAN SECTION N/A 10/24/2018   Procedure: REPEAT CESAREAN SECTION;  Surgeon: Sydney Queen, MD;  Location: [facility];  Service: Obstetrics;  Laterality: N/A;  Sydney Rios RNFA  . LITHOTRIPSY    . NO PAST SURGERIES    . WISDOM TOOTH EXTRACTION      Family History  Problem Relation Age of Onset  . Hypertension Mother   . Diabetes Mother   . Hypertension Father   . Hypertension Maternal Grandmother   . Diabetes  Maternal Grandmother   . Heart disease Maternal Grandfather   . Hypertension Maternal Grandfather   . Heart disease Paternal Grandfather   . Hypertension Paternal Grandfather    Social History:  reports that she has never smoked. She has never used smokeless tobacco. She reports that she does not drink alcohol or use drugs.  Allergies: No Known Allergies  Medications Prior to Admission  Medication Sig Dispense Refill  . Ascorbic Acid (VITAMIN C) 1000 MG tablet Take 1,000 mg by mouth daily.    . Cholecalciferol (VITAMIN D3) 125 MCG (5000 UT) CAPS Take by  mouth.    . Magnesium 400 MG TABS Take by mouth.    . Multiple Vitamins-Minerals (MULTIVITAMIN ADULTS PO) Take by mouth.    . valACYclovir (VALTREX) 500 MG tablet Take 500 mg by mouth 2 (two) times daily.    . Zinc 100 MG TABS Take by mouth.      Results for orders placed or performed during the hospital encounter of 11/19/19 (from the past 48 hour(s))  Pregnancy, urine POC     Status: None   Collection Time: 11/19/19  8:34 AM  Result Value Ref Range   Preg Test, Ur NEGATIVE NEGATIVE    Comment:        THE SENSITIVITY OF THIS METHODOLOGY IS >24 mIU/mL    No results found.  ROS  Blood pressure (!) 112/59, pulse 68, temperature (!) 97.5 F (36.4 C), temperature source Skin, resp. rate 20, height 5\' 7"  (1.702 m), weight 70.5 kg, SpO2 100 %, currently breastfeeding. Physical Exam     Sydney Bookbinder, MD 11/19/2019, 9:18 AM

## 2019-11-19 NOTE — Anesthesia Preprocedure Evaluation (Deleted)
Anesthesia Evaluation  Patient identified by MRN, date of birth, ID band Patient awake    Reviewed: Allergy & Precautions, NPO status , Patient's Chart, lab work & pertinent test results  History of Anesthesia Complications (+) PONV  Airway Mallampati: II  TM Distance: >3 FB Neck ROM: Full    Dental no notable dental hx.    Pulmonary neg pulmonary ROS,    Pulmonary exam normal breath sounds clear to auscultation       Cardiovascular negative cardio ROS Normal cardiovascular exam Rhythm:Regular Rate:Normal     Neuro/Psych negative neurological ROS  negative psych ROS   GI/Hepatic negative GI ROS, Neg liver ROS,   Endo/Other  negative endocrine ROS  Renal/GU negative Renal ROS  negative genitourinary   Musculoskeletal negative musculoskeletal ROS (+)   Abdominal   Peds negative pediatric ROS (+)  Hematology negative hematology ROS (+)   Anesthesia Other Findings   Reproductive/Obstetrics negative OB ROS                             Anesthesia Physical Anesthesia Plan  ASA: I  Anesthesia Plan: General   Post-op Pain Management:    Induction: Intravenous  PONV Risk Score and Plan: 4 or greater and Ondansetron, Dexamethasone, Midazolam, Scopolamine patch - Pre-op and Treatment may vary due to age or medical condition  Airway Management Planned: Oral ETT  Additional Equipment:   Intra-op Plan:   Post-operative Plan: Extubation in OR  Informed Consent: I have reviewed the patients History and Physical, chart, labs and discussed the procedure including the risks, benefits and alternatives for the proposed anesthesia with the patient or authorized representative who has indicated his/her understanding and acceptance.     Dental advisory given  Plan Discussed with: CRNA and Surgeon  Anesthesia Plan Comments:         Anesthesia Quick Evaluation

## 2019-11-19 NOTE — Progress Notes (Signed)
Surgery cancelled by Dr. Donne Hazel (see his note). Pt aware to call his office to reschedule (and coordinate with plastic surgeon availability)

## 2021-10-26 ENCOUNTER — Other Ambulatory Visit: Payer: Self-pay | Admitting: Obstetrics and Gynecology

## 2021-10-26 DIAGNOSIS — N6310 Unspecified lump in the right breast, unspecified quadrant: Secondary | ICD-10-CM

## 2021-11-03 ENCOUNTER — Ambulatory Visit
Admission: RE | Admit: 2021-11-03 | Discharge: 2021-11-03 | Disposition: A | Discharge status: Home / Self Care | Payer: Managed Care, Other (non HMO) | Source: Ambulatory Visit | Attending: Obstetrics and Gynecology | Admitting: Obstetrics and Gynecology

## 2021-11-03 ENCOUNTER — Other Ambulatory Visit: Payer: Self-pay

## 2021-11-03 ENCOUNTER — Other Ambulatory Visit: Payer: Self-pay | Admitting: Obstetrics and Gynecology

## 2021-11-03 ENCOUNTER — Ambulatory Visit
Admission: RE | Admit: 2021-11-03 | Discharge: 2021-11-03 | Disposition: A | Discharge status: Home / Self Care | Payer: BC Managed Care – PPO | Source: Ambulatory Visit | Attending: Obstetrics and Gynecology | Admitting: Obstetrics and Gynecology

## 2021-11-03 DIAGNOSIS — N6310 Unspecified lump in the right breast, unspecified quadrant: Secondary | ICD-10-CM

## 2021-11-03 IMAGING — MG DIGITAL DIAGNOSTIC BILAT W/ TOMO W/ CAD
8 of 14 series · 8 of 40 positions shown · non-contrast
Comparison: None.

CLINICAL DATA: 40-year-old female with a palpable, painful right
axillary lump and palpable right breast lump. The patient is
currently nursing 2 times a day. She is 3 years post partum.

EXAM:
DIGITAL DIAGNOSTIC BILATERAL MAMMOGRAM WITH TOMOSYNTHESIS AND CAD;
ULTRASOUND RIGHT BREAST LIMITED
TECHNIQUE: Bilateral digital diagnostic mammography and breast tomosynthesis
was performed. The images were evaluated with computer-aided
detection.; Targeted ultrasound examination of the right breast was
performed

[R TAN synth-2D (1 of 3)]
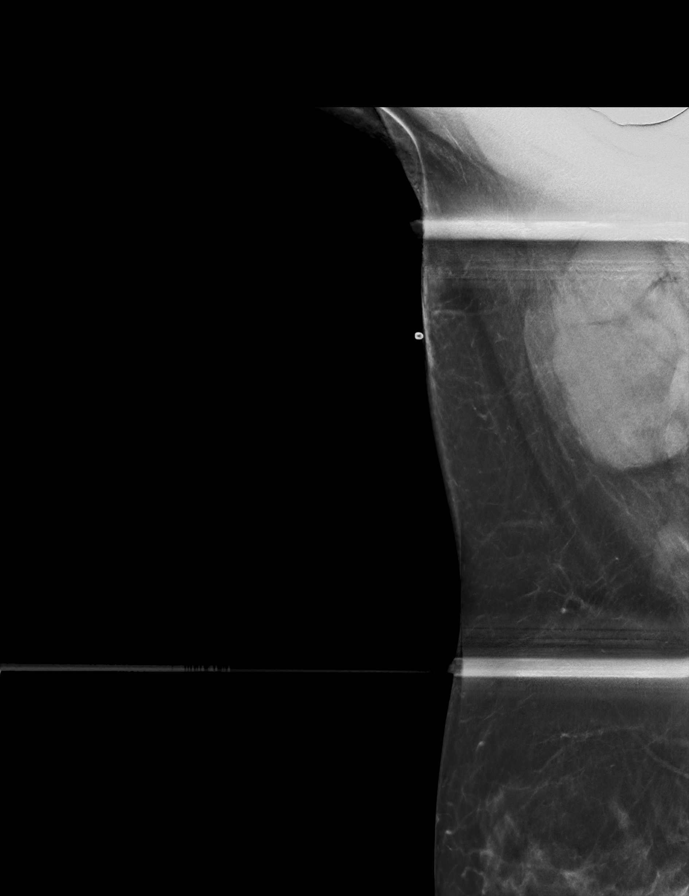

[L MLO synth-2D]
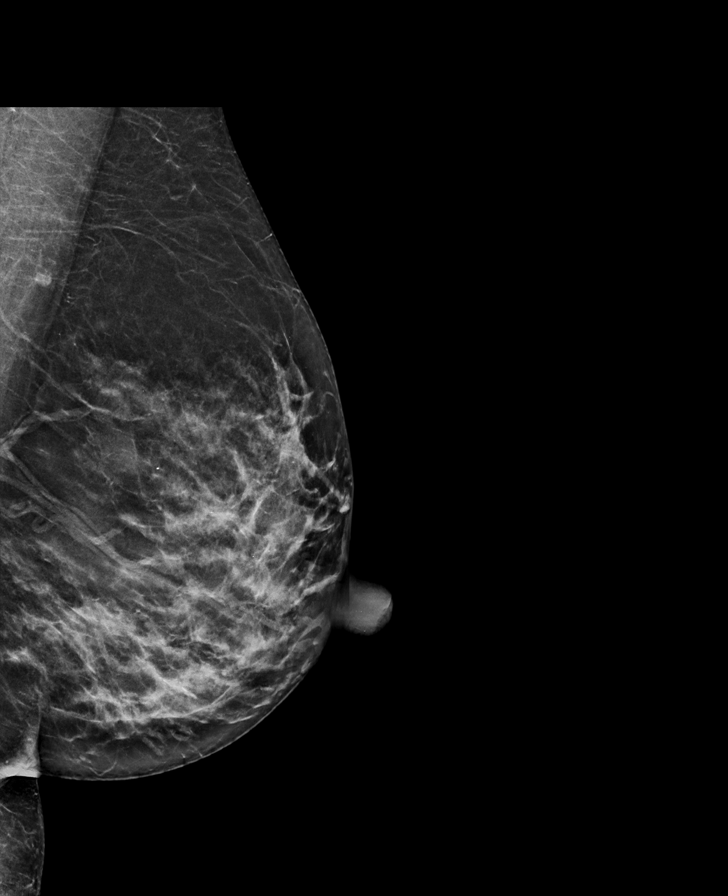

[L CC synth-2D]
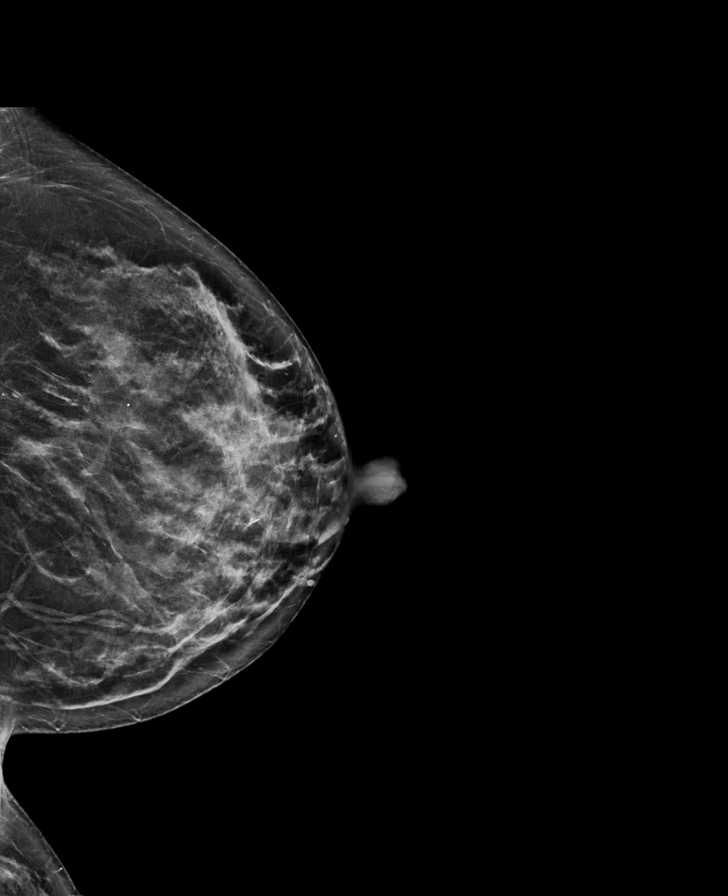

[R TAN synth-2D (2 of 3)]
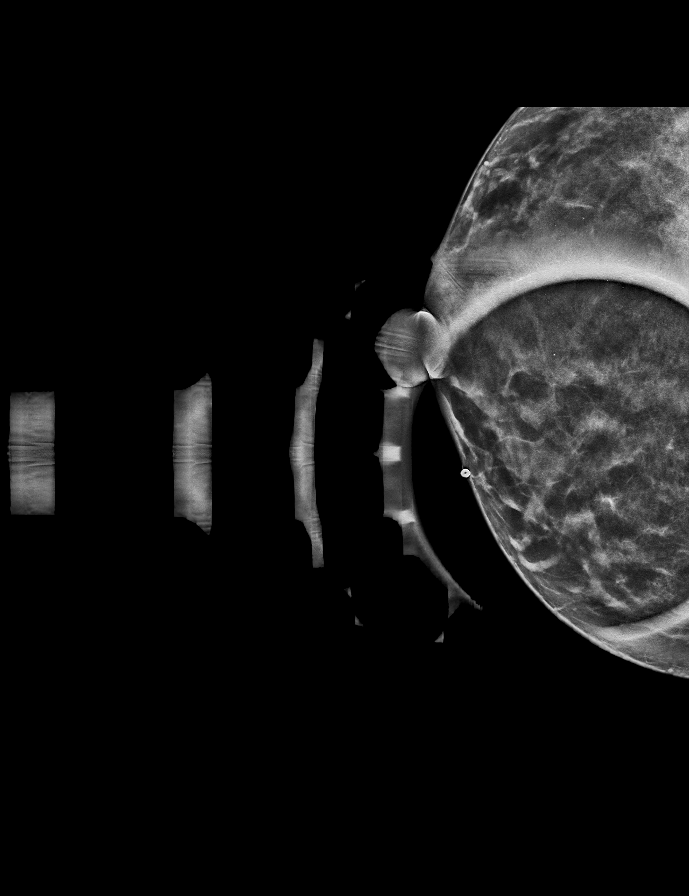

[R TAN synth-2D (3 of 3)]
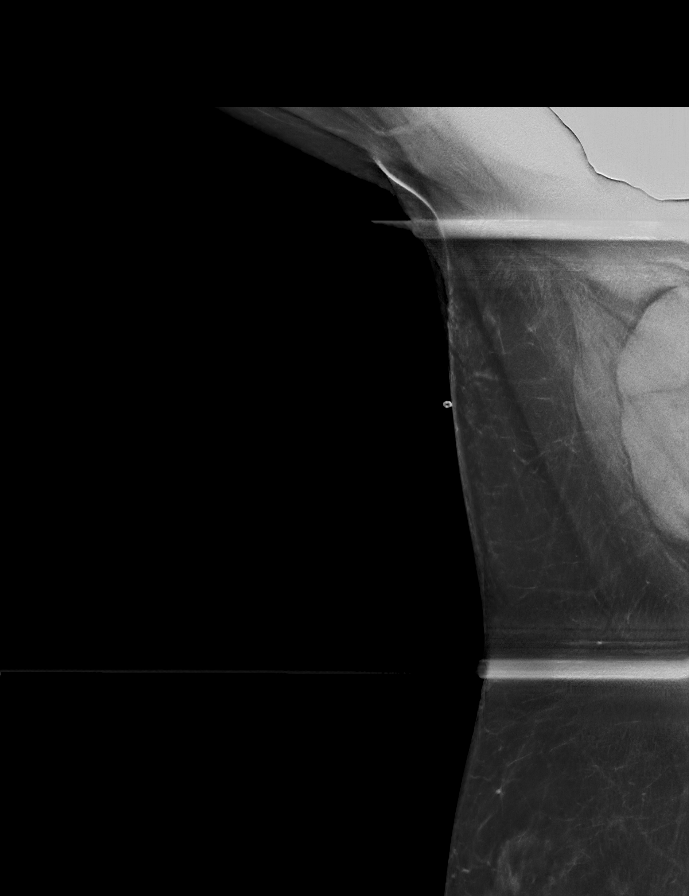

[R MLO synth-2D]
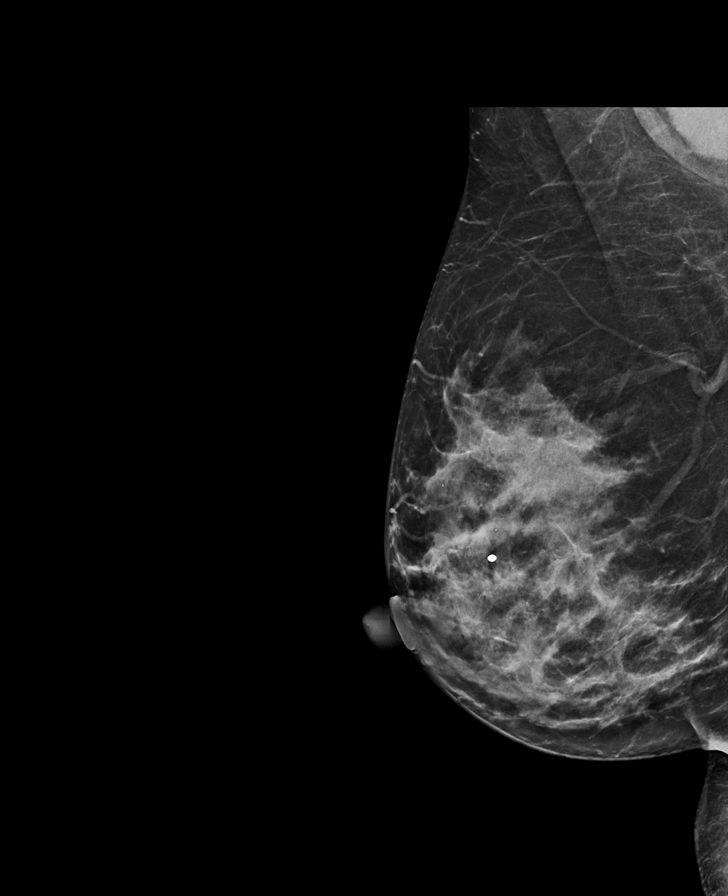

[R CC synth-2D]
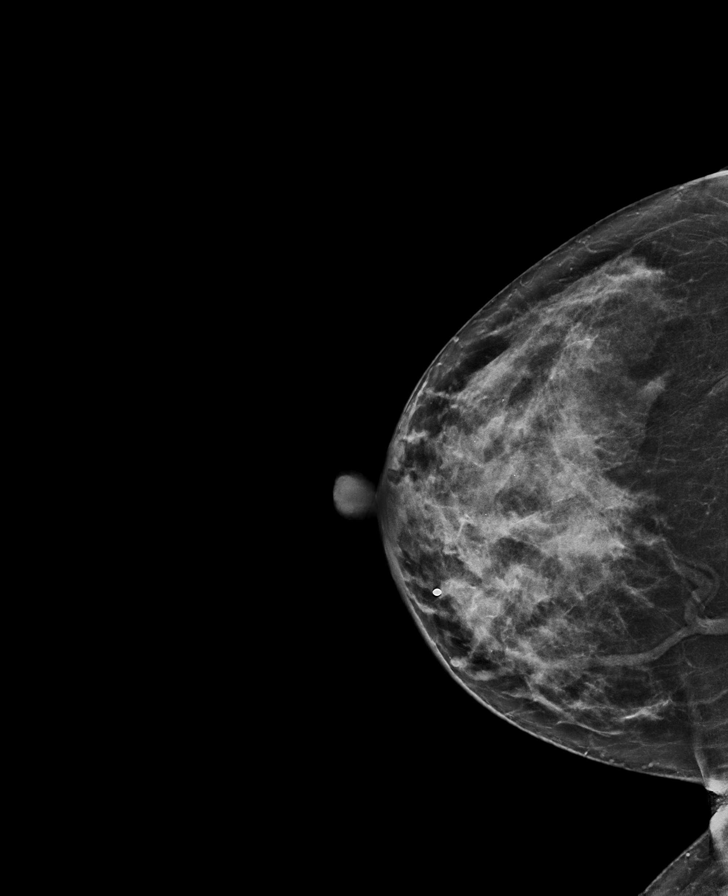

[R TAN tomo · tomo slice 28/55.0]
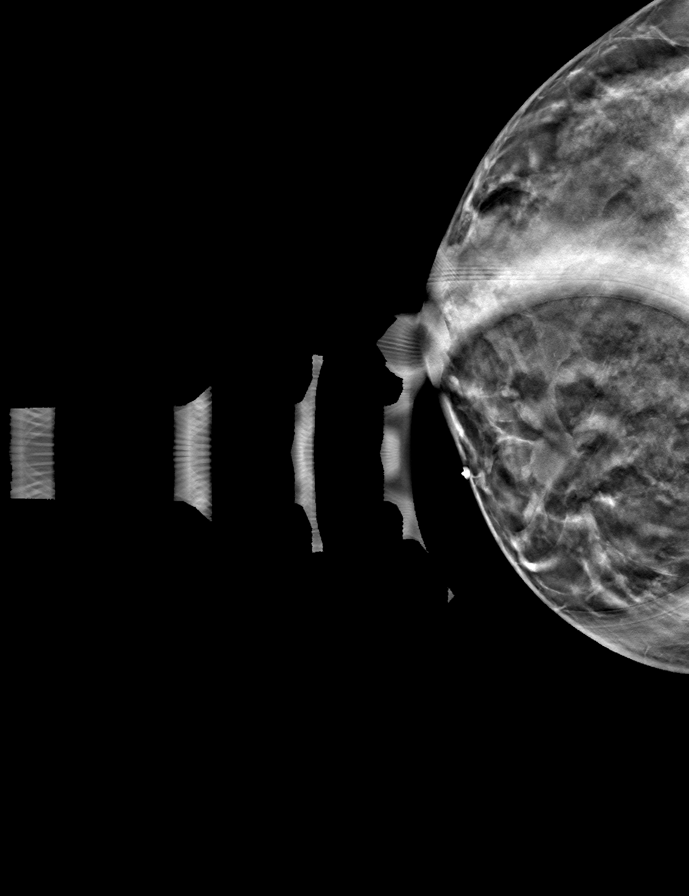

[8 of 40 positions shown; findings below may reference images not displayed]

ACR Breast Density Category d: The breast tissue is extremely dense,
which lowers the sensitivity of mammography.
FINDINGS: Radiopaque BB was placed at the site of the patient's right breast
palpable lump. A spiculated mass with associated distortion is seen
deep to the radiopaque BB. Additionally a large, morphologically
abnormal lymph node is partially visualized in the right axilla. No
other focal or suspicious mammographic findings within either breast
within the limitations of extremely dense breast tissue.

On physical exam, I palpate a firm, mobile 2-3 cm mass along the
medial aspect of the right breast.

Targeted ultrasound is performed, showing an irregular hypoechoic
mass with associated vascularity at the 2 o'clock position 3 cm from
the nipple. It measures 2.6 x 1.3 x 1.9 cm. This corresponds with
the palpable lump and the mammographically identified mass.
Additionally, a single highly abnormal lymph node is identified
within the right axilla. It measures 5.8 x 2.6 x 3.3 cm. Evaluation
of deeper lymph nodes are obscured.
IMPRESSION: 1. Suspicious right breast mass at the 2 o'clock position 3 cm from
the nipple. Recommendation is for ultrasound-guided biopsy.
2. Suspicious right axillary lymph node. Recommendation is for
ultrasound-guided biopsy.
3. No mammographic evidence of malignancy on the left.

RECOMMENDATION:
1. Two area ultrasound-guided biopsy of the right breast and right
axilla.
2. Pending pathology results, further evaluation of contrast
enhanced breast MRI is recommended given the patient's extreme
breast density and for further evaluation of the deep axillary
structures.

I have discussed the findings and recommendations with the patient.
If applicable, a reminder letter will be sent to the patient
regarding the next appointment.

BI-RADS CATEGORY  4: Suspicious.

## 2021-11-03 IMAGING — US US BREAST*R* LIMITED INC AXILLA
1 series · 12 of 12 positions shown · non-contrast
Comparison: None.

CLINICAL DATA: 40-year-old female with a palpable, painful right
axillary lump and palpable right breast lump. The patient is
currently nursing 2 times a day. She is 3 years post partum.

EXAM:
DIGITAL DIAGNOSTIC BILATERAL MAMMOGRAM WITH TOMOSYNTHESIS AND CAD;
ULTRASOUND RIGHT BREAST LIMITED
TECHNIQUE: Bilateral digital diagnostic mammography and breast tomosynthesis
was performed. The images were evaluated with computer-aided
detection.; Targeted ultrasound examination of the right breast was
performed

[Series 1: us breast*right* limited inc axilla · 0.06mm/px · 12 of 12 slices shown]
[im 1/12]
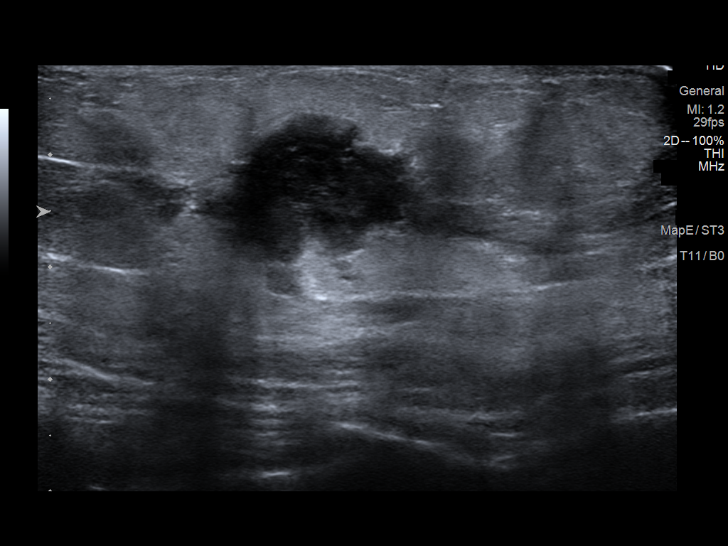
[im 2/12]
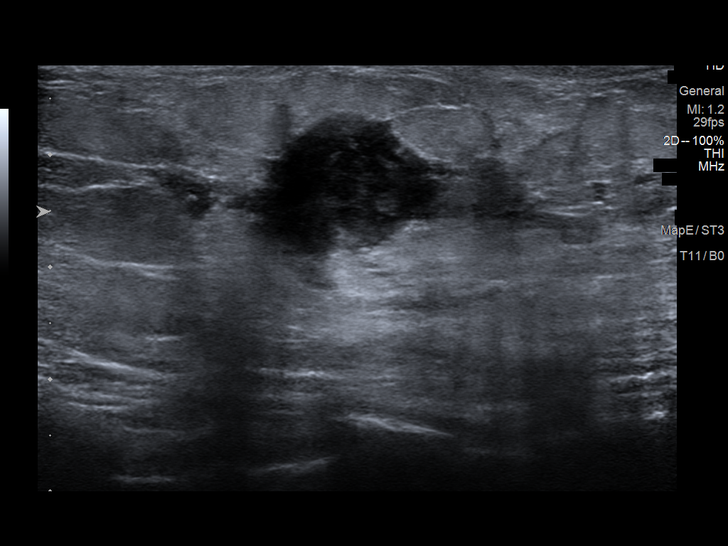
[im 3/12]
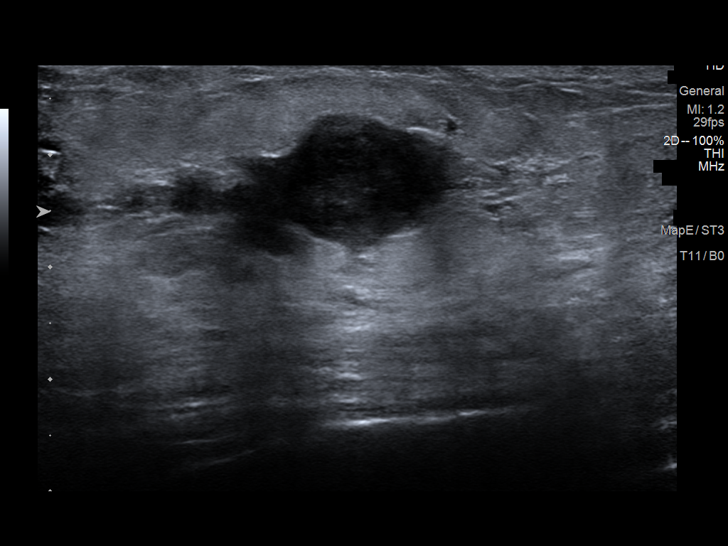
[im 4/12]
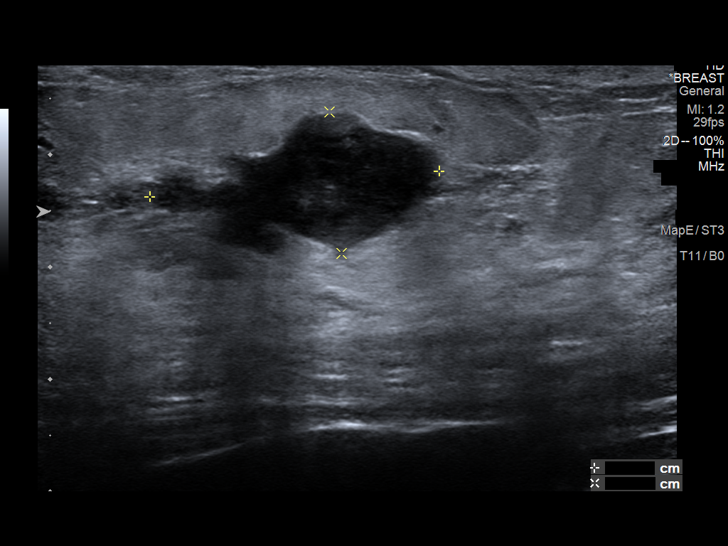
[im 5/12]
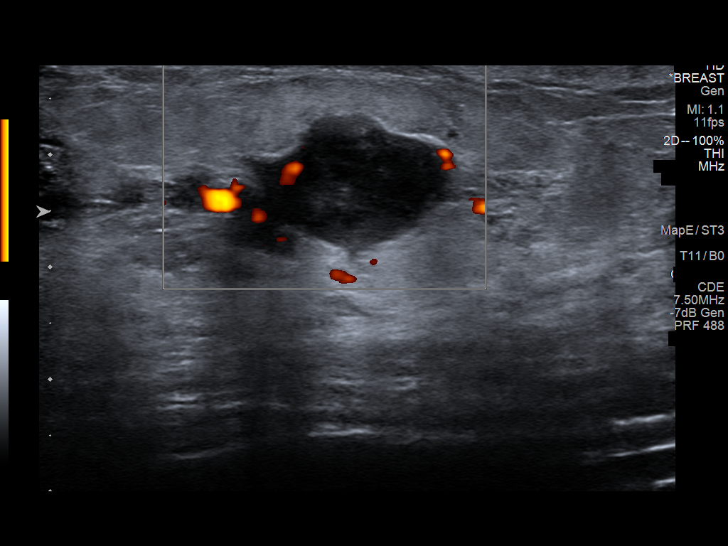
[im 6/12]
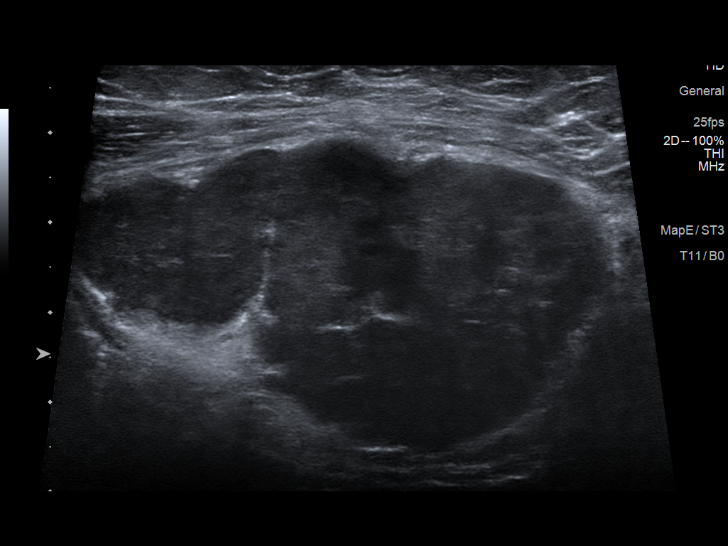
[im 7/12]
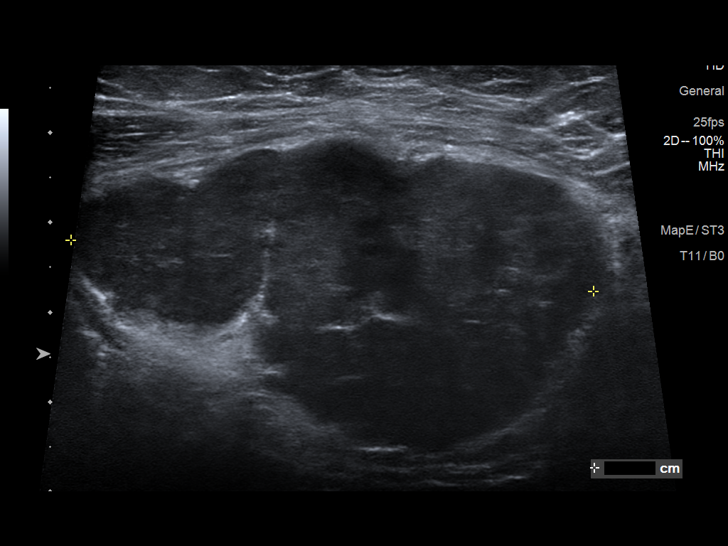
[im 8/12]
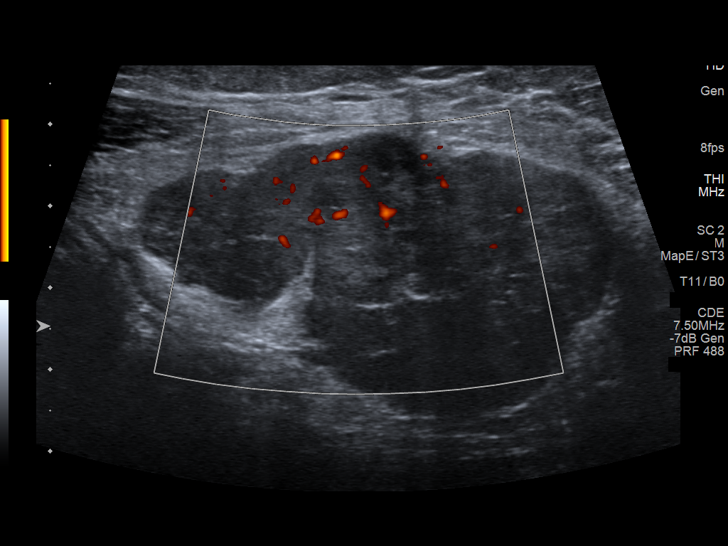
[im 9/12]
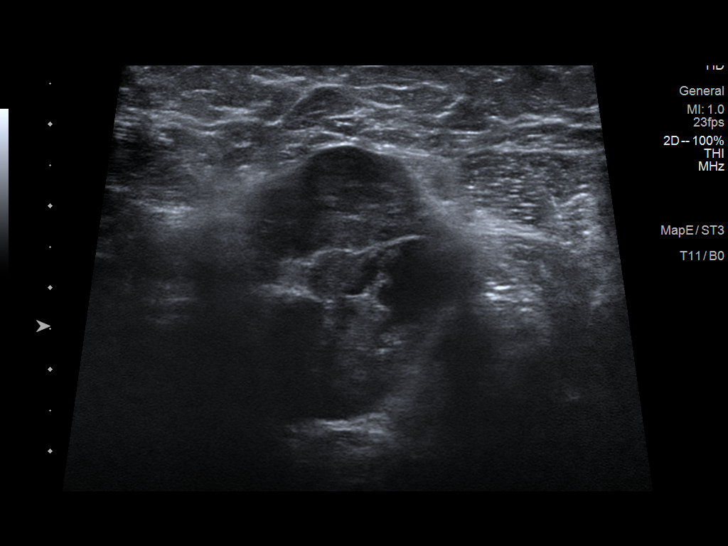
[im 10/12]
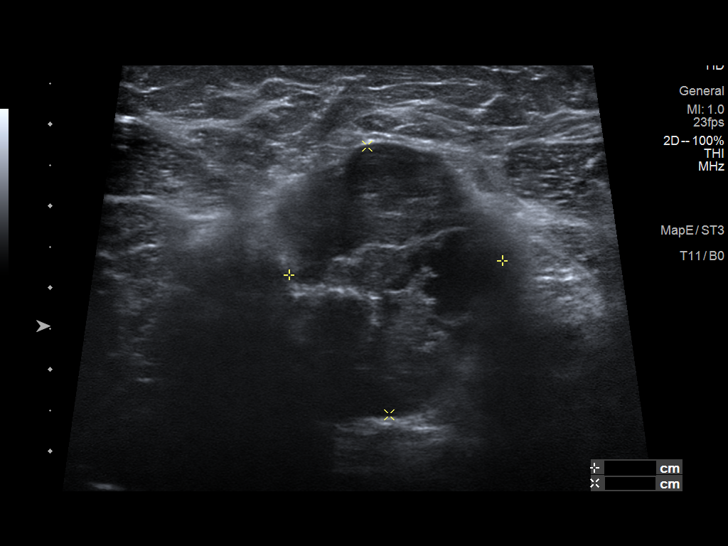
[im 11/12]
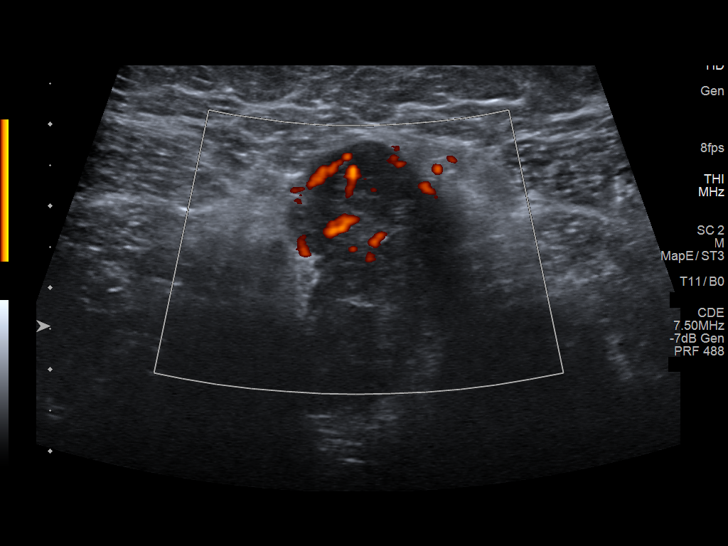
[im 12/12]
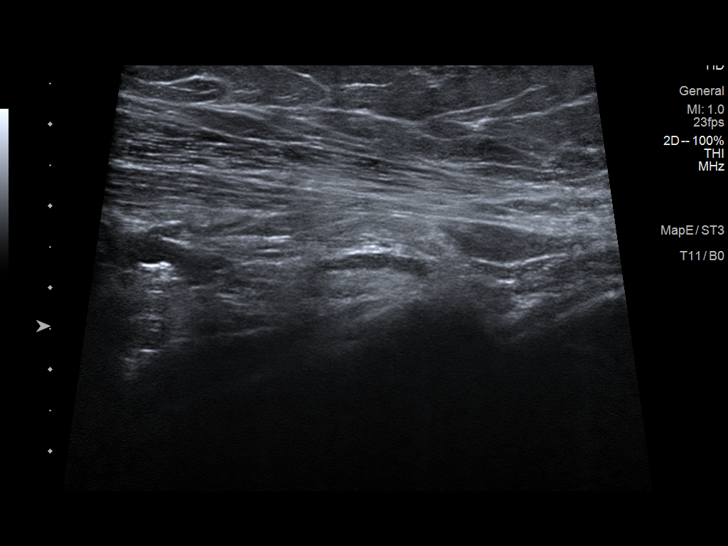

[12 of 12 positions shown; findings below may reference images not displayed]

ACR Breast Density Category d: The breast tissue is extremely dense,
which lowers the sensitivity of mammography.
FINDINGS: Radiopaque BB was placed at the site of the patient's right breast
palpable lump. A spiculated mass with associated distortion is seen
deep to the radiopaque BB. Additionally a large, morphologically
abnormal lymph node is partially visualized in the right axilla. No
other focal or suspicious mammographic findings within either breast
within the limitations of extremely dense breast tissue.

On physical exam, I palpate a firm, mobile 2-3 cm mass along the
medial aspect of the right breast.

Targeted ultrasound is performed, showing an irregular hypoechoic
mass with associated vascularity at the 2 o'clock position 3 cm from
the nipple. It measures 2.6 x 1.3 x 1.9 cm. This corresponds with
the palpable lump and the mammographically identified mass.
Additionally, a single highly abnormal lymph node is identified
within the right axilla. It measures 5.8 x 2.6 x 3.3 cm. Evaluation
of deeper lymph nodes are obscured.
IMPRESSION: 1. Suspicious right breast mass at the 2 o'clock position 3 cm from
the nipple. Recommendation is for ultrasound-guided biopsy.
2. Suspicious right axillary lymph node. Recommendation is for
ultrasound-guided biopsy.
3. No mammographic evidence of malignancy on the left.

RECOMMENDATION:
1. Two area ultrasound-guided biopsy of the right breast and right
axilla.
2. Pending pathology results, further evaluation of contrast
enhanced breast MRI is recommended given the patient's extreme
breast density and for further evaluation of the deep axillary
structures.

I have discussed the findings and recommendations with the patient.
If applicable, a reminder letter will be sent to the patient
regarding the next appointment.

BI-RADS CATEGORY  4: Suspicious.

## 2021-11-04 ENCOUNTER — Ambulatory Visit
Admission: RE | Admit: 2021-11-04 | Discharge: 2021-11-04 | Disposition: A | Discharge status: Home / Self Care | Payer: Managed Care, Other (non HMO) | Source: Ambulatory Visit | Attending: Obstetrics and Gynecology | Admitting: Obstetrics and Gynecology

## 2021-11-04 DIAGNOSIS — N6310 Unspecified lump in the right breast, unspecified quadrant: Secondary | ICD-10-CM

## 2021-11-04 IMAGING — MG MM BREAST LOCALIZATION CLIP
6 series · 6 of 18 positions shown · non-contrast
Comparison: Previous exam(s).

CLINICAL DATA: Status post ultrasound-guided biopsy for a RIGHT
breast mass at the 2 o'clock axis and ultrasound-guided biopsy a
markedly enlarged lymph node in the RIGHT axilla.

EXAM:
3D DIAGNOSTIC RIGHT MAMMOGRAM POST ULTRASOUND BIOPSY x2

[R ML synth-2D (1 of 2)]
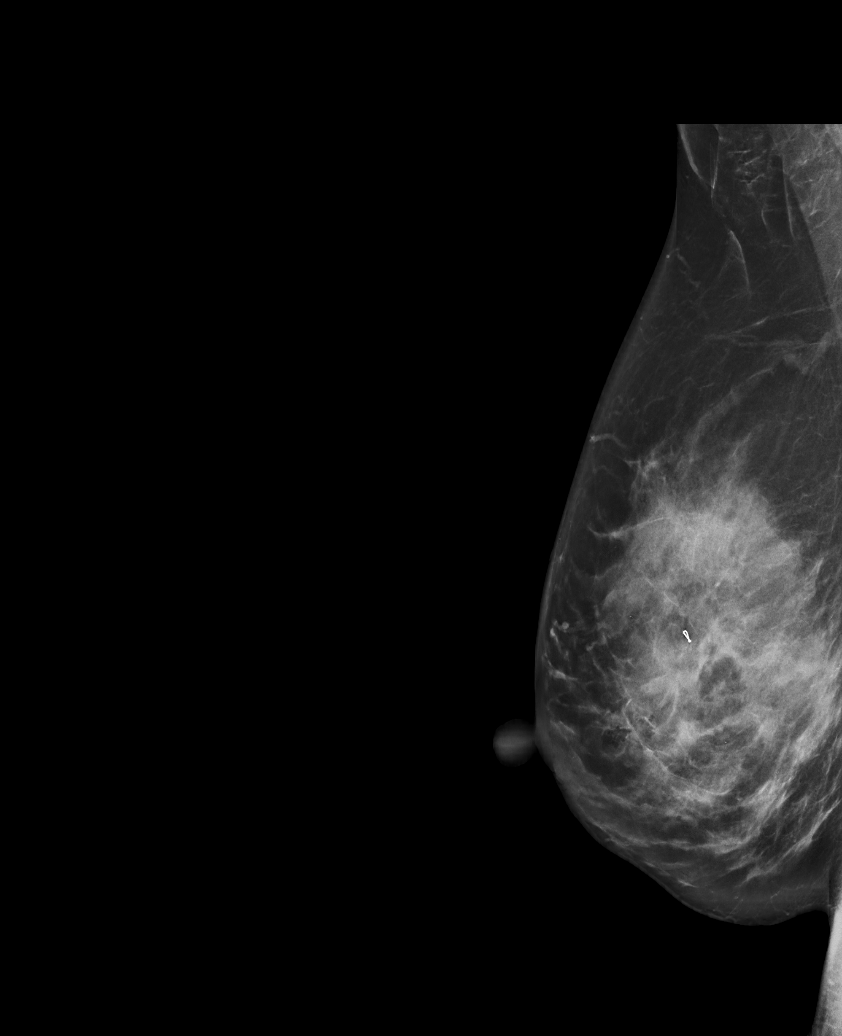

[R ML synth-2D (2 of 2)]
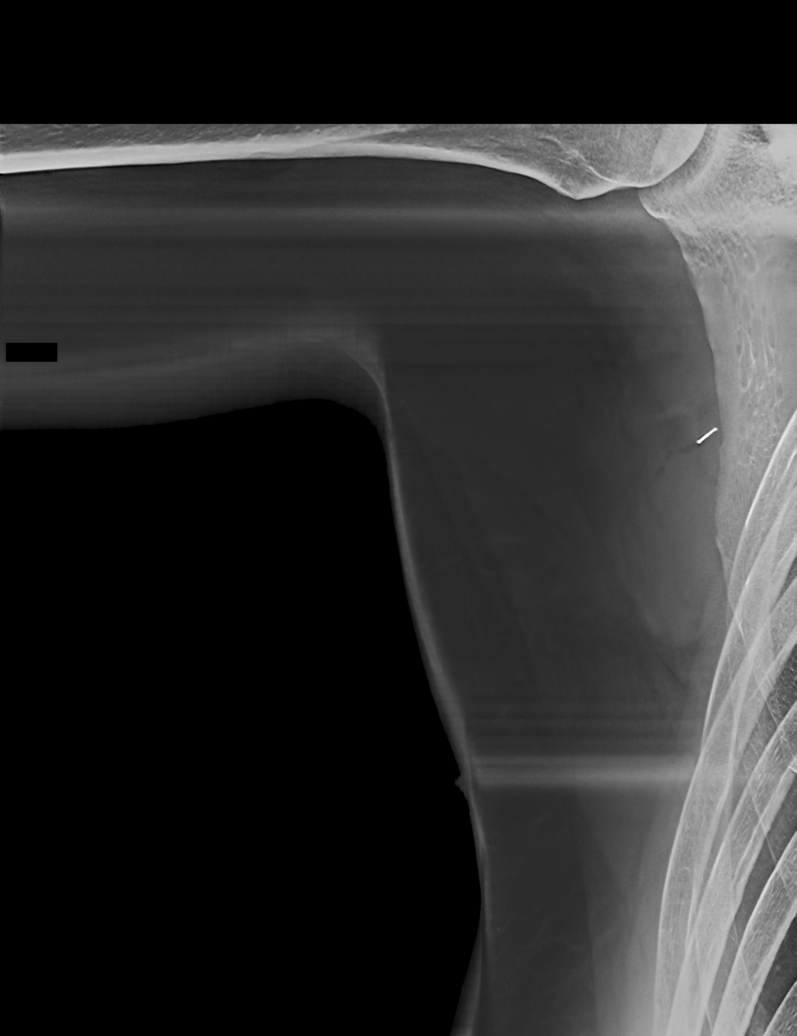

[R CC synth-2D]
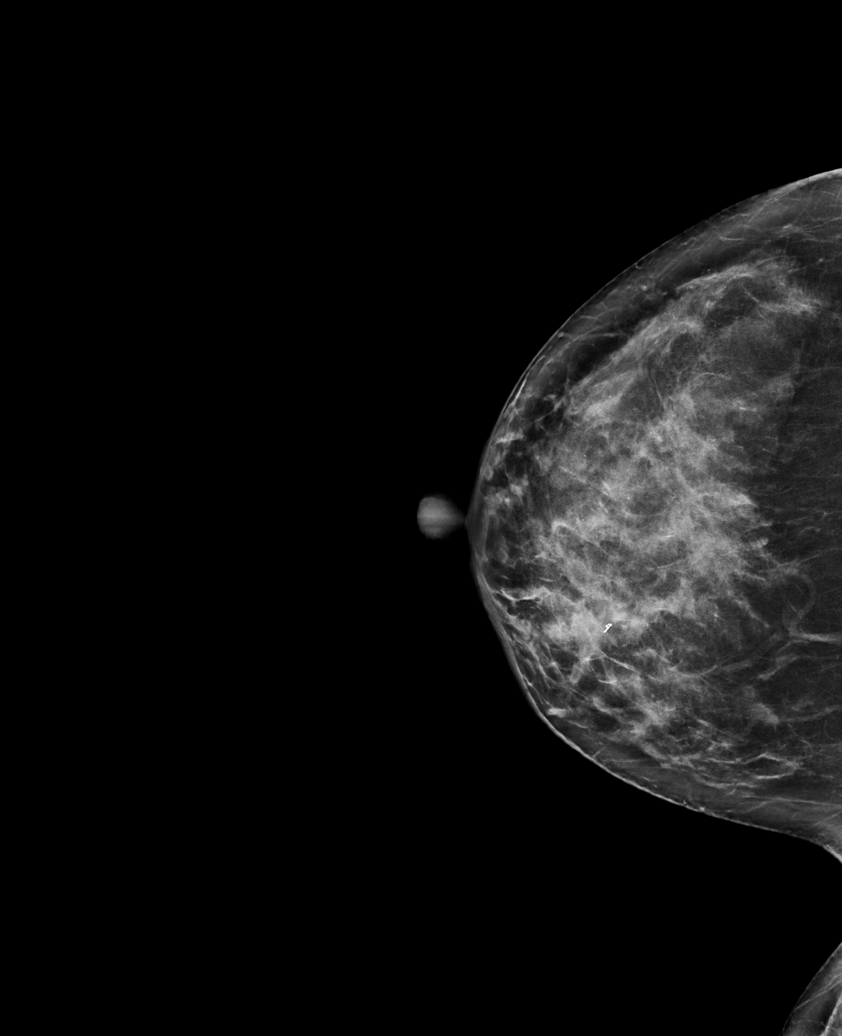

[R ML tomo (1 of 2) · tomo slice 46/91.0]
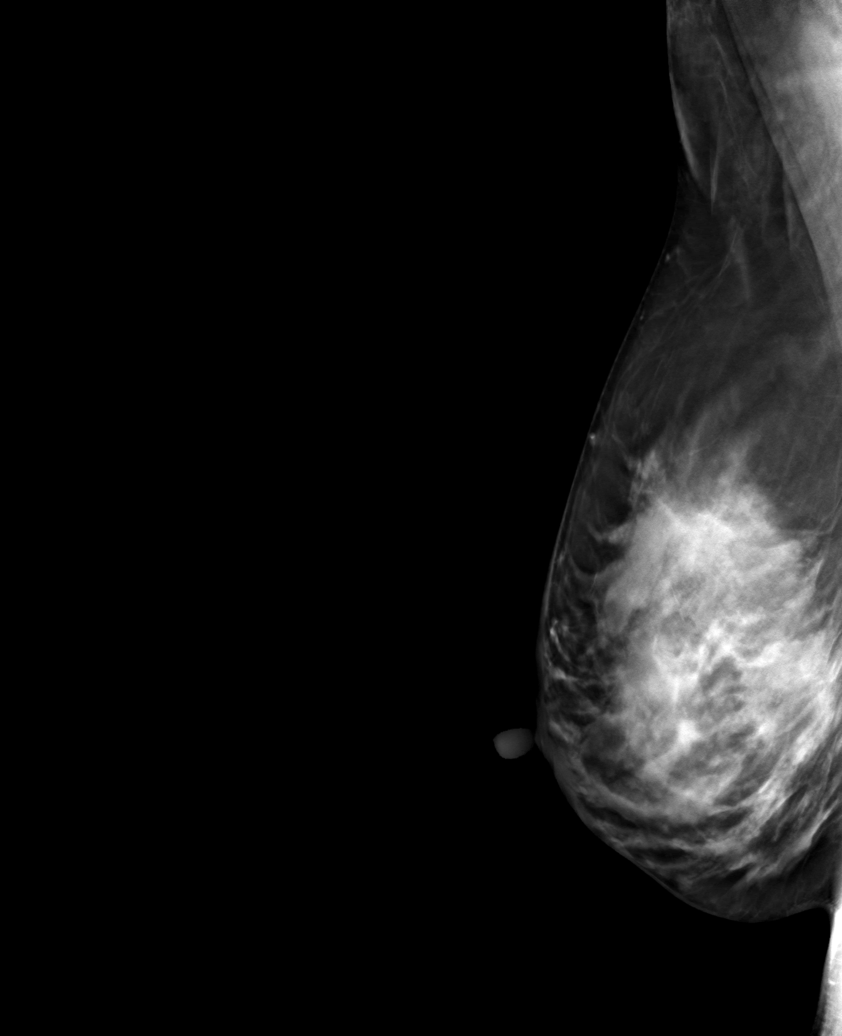

[R CC tomo · tomo slice 36/71.0]
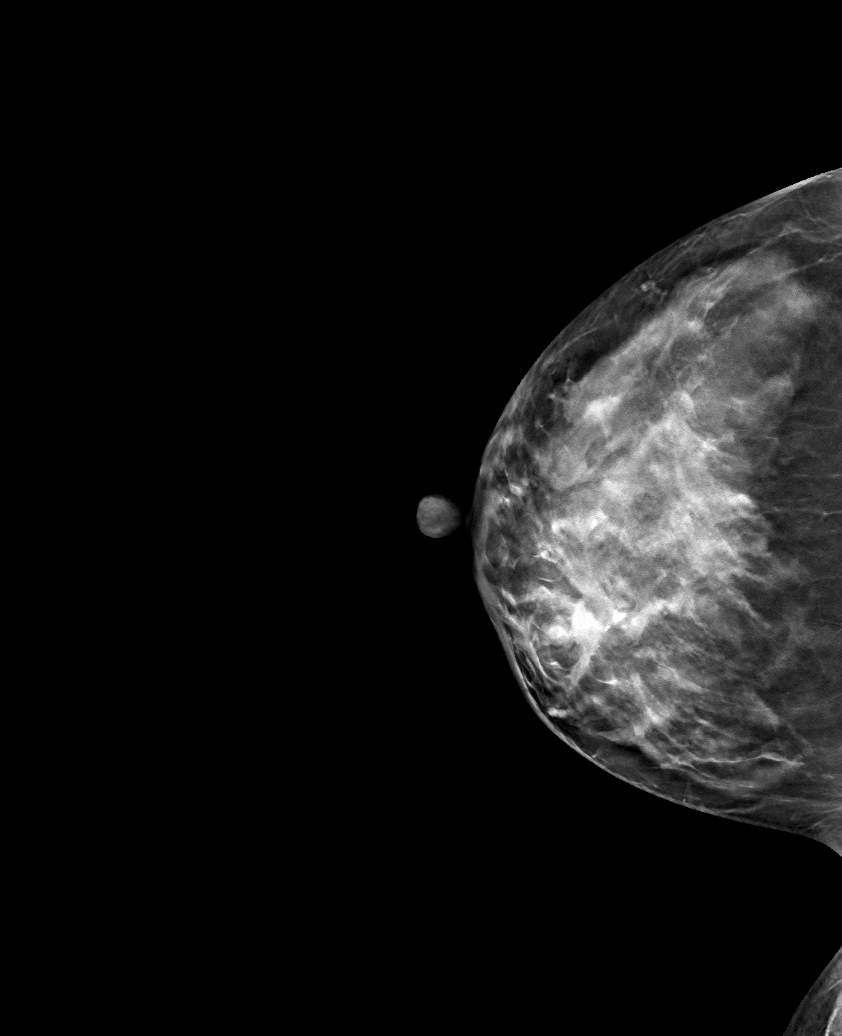

[R ML tomo (2 of 2) · tomo slice 65/130.0]
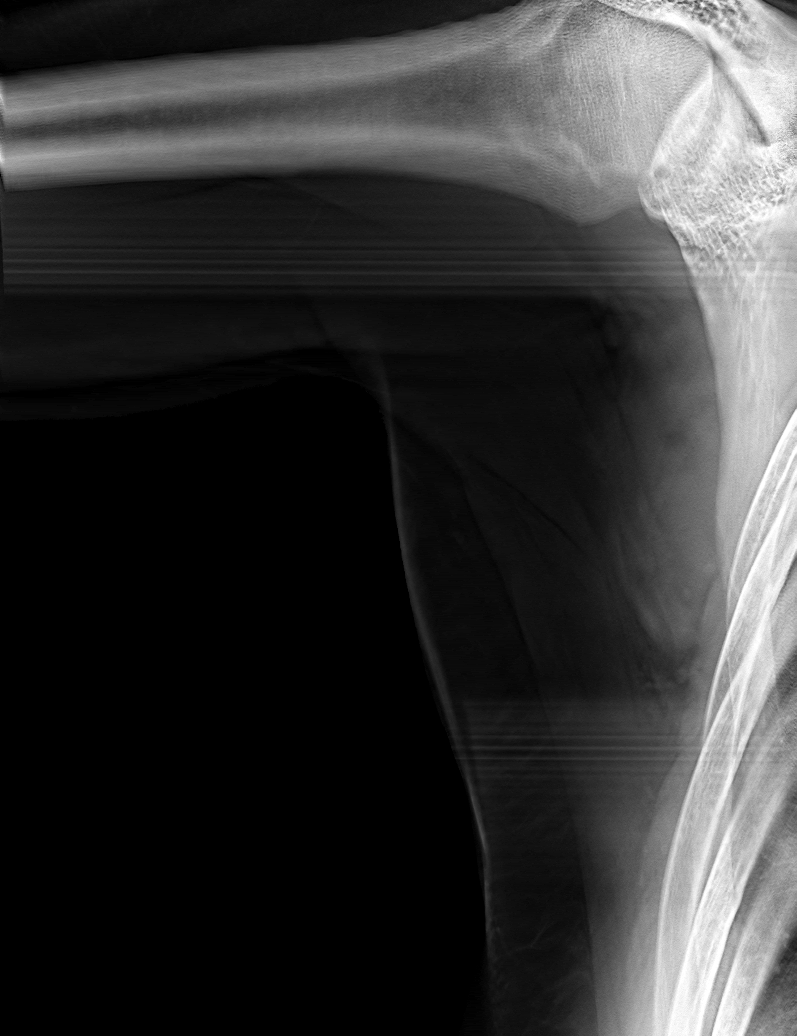

[6 of 18 positions shown; findings below may reference images not displayed]

FINDINGS: 3D Mammographic images were obtained following ultrasound guided
biopsy of the RIGHT breast mass at the 2 o'clock axis and
ultrasound-guided biopsy of the markedly enlarged lymph node in the
RIGHT axilla. The biopsy marking clips are in expected position at
the sites of biopsy.
IMPRESSION: 1. Appropriate positioning of the ribbon shaped biopsy marking clip
at the site of biopsy in the upper inner quadrant the RIGHT breast
corresponding to the targeted mass at the 2 o'clock axis.
2. Appropriate positioning of the JAHNINA biopsy marking clip at
the site of biopsy in the RIGHT axilla.

Final Assessment: Post Procedure Mammograms for Marker Placement

## 2021-11-04 IMAGING — US US  BREAST BX W/ LOC DEV 1ST LESION IMG BX SPEC US GUIDE*R*
1 series · 12 of 22 positions shown · non-contrast
Comparison: Previous exam(s).
COMPARISON: Previous exam(s).

Addendum:
CLINICAL DATA: Patient with a RIGHT breast mass and an enlarged
lymph node in the RIGHT axilla presenting today for
ultrasound-guided biopsies.

EXAM:
ULTRASOUND GUIDED RIGHT BREAST CORE NEEDLE BIOPSY
ULTRASOUND GUIDED RIGHT AXILLA CORE NEEDLE BIOPSY

[Series 1: us breast bx w/ loc dev 1st lesion img bx spec us  · 0.07mm/px · 12 of 22 slices shown]
[im 1/22]
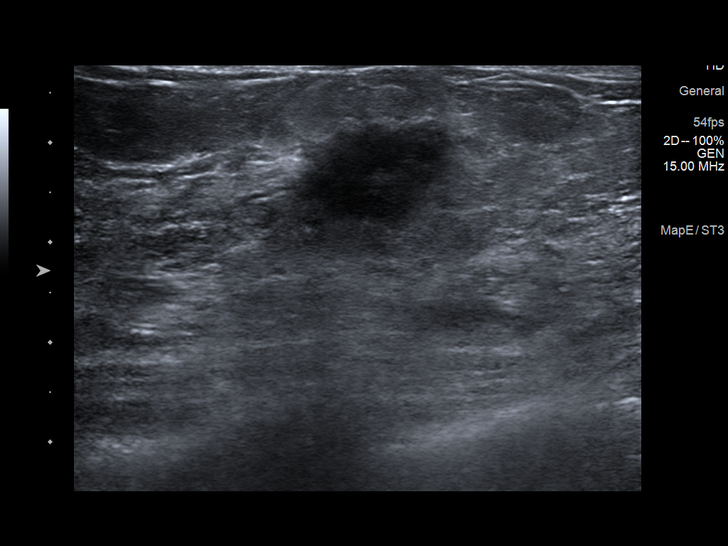
[im 3/22]
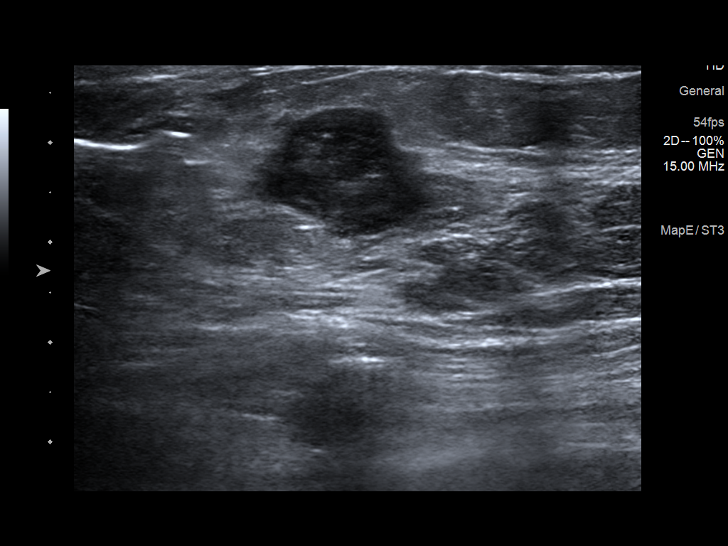
[im 5/22]
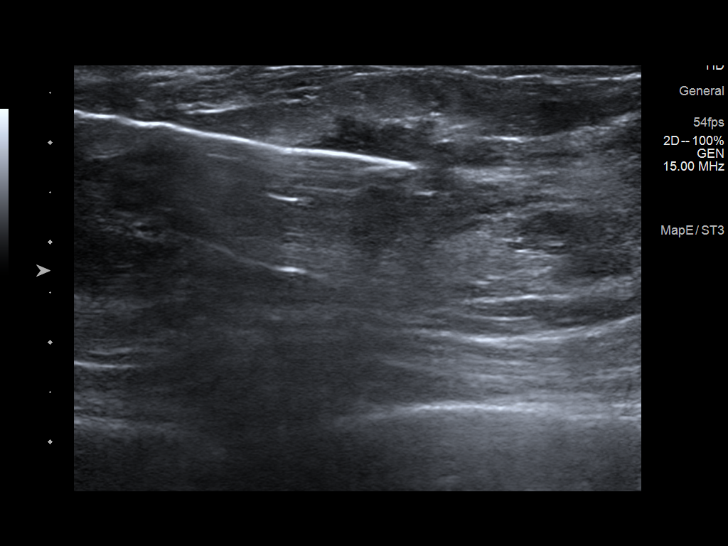
[im 7/22]
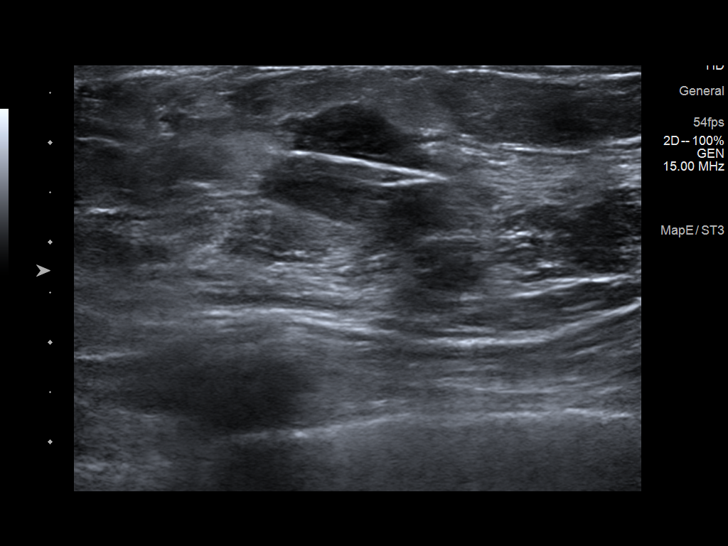
[im 9/22]
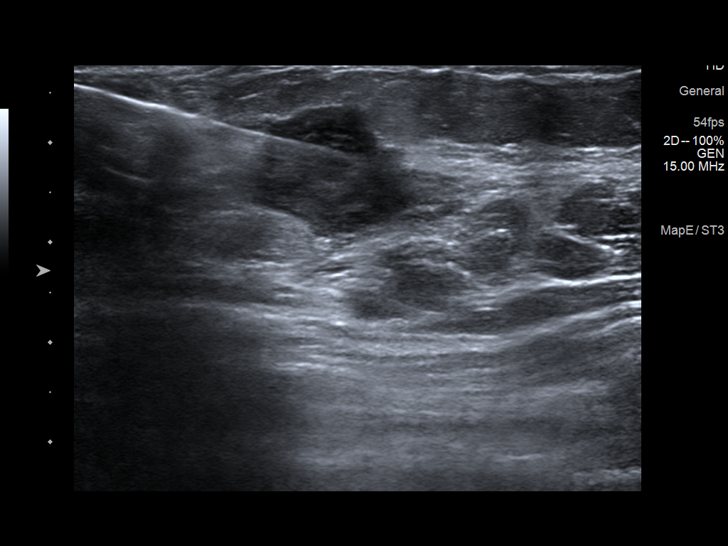
[im 11/22]
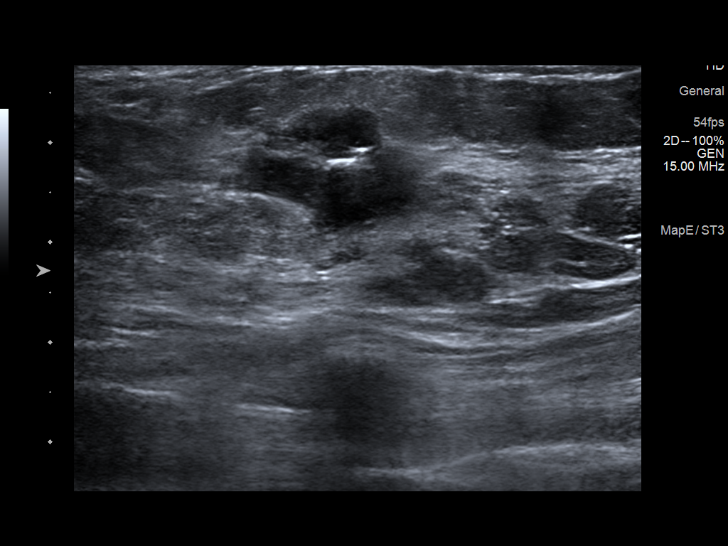
[im 12/22]
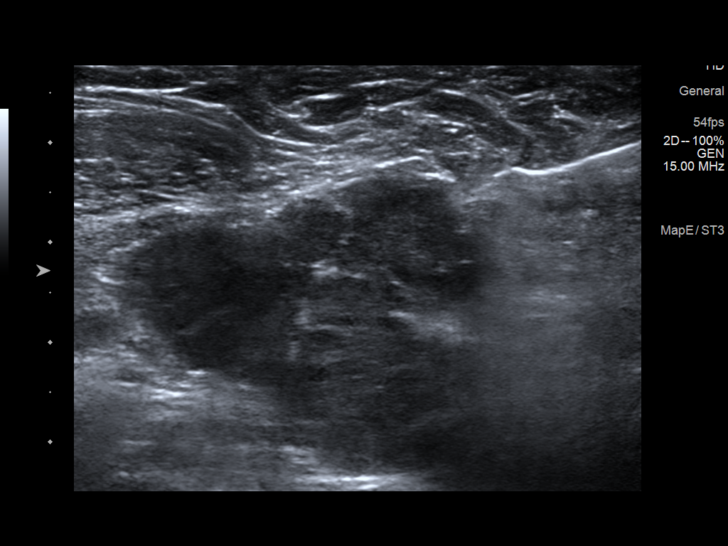
[im 14/22]
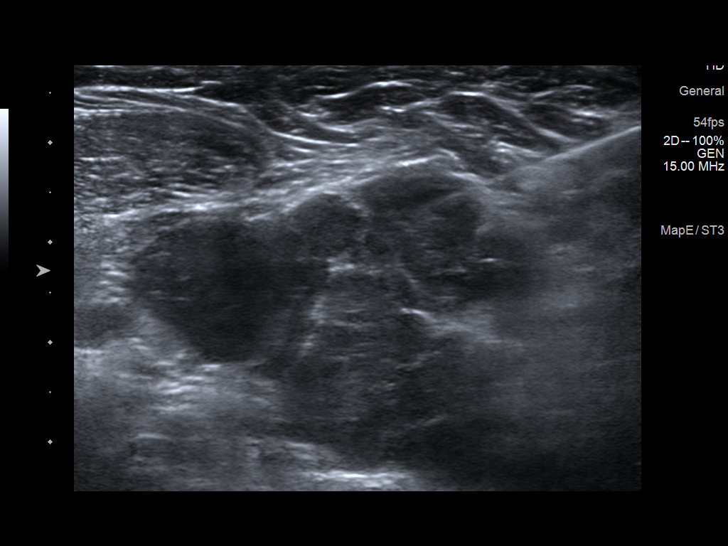
[im 16/22]
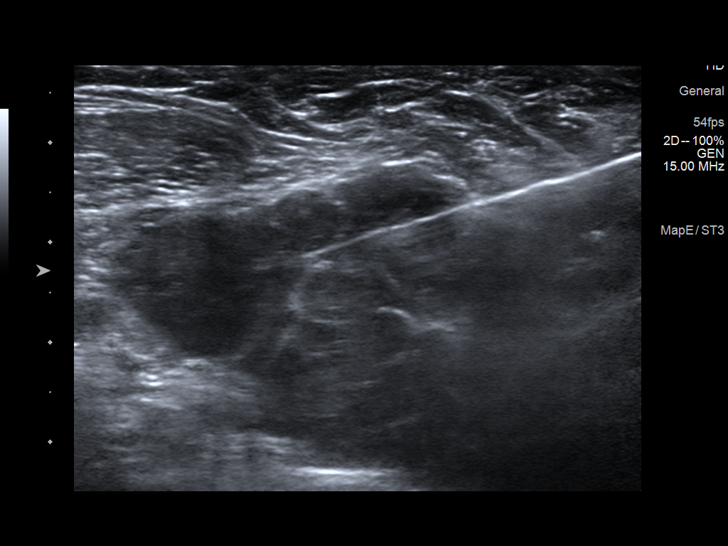
[im 18/22]
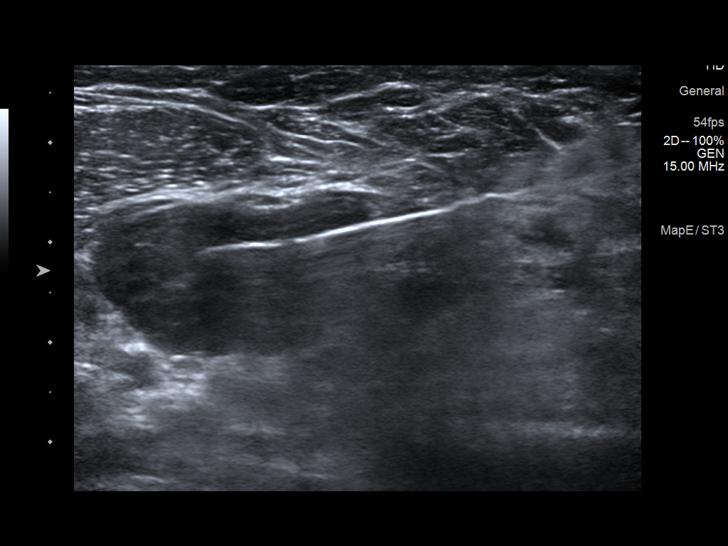
[im 20/22]
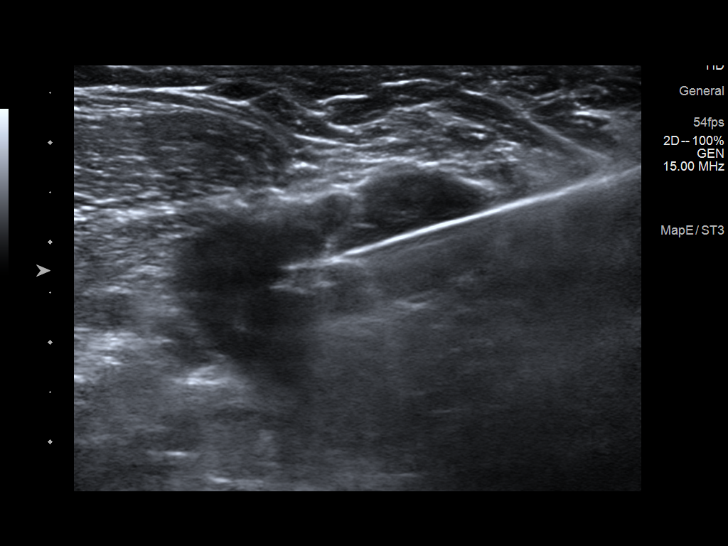
[im 22/22]
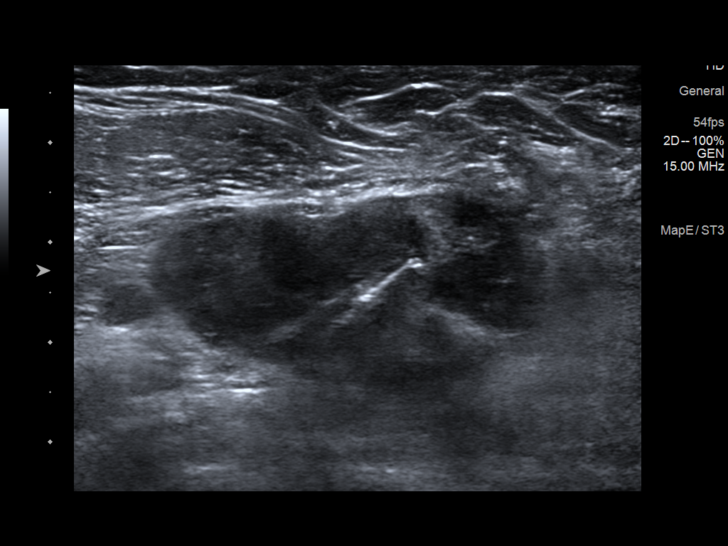

[12 of 22 positions shown; findings below may reference images not displayed]



Site 1:

Lesion quadrant: Upper outer quadrant

Using sterile technique and 1% Lidocaine as local anesthetic, under
direct ultrasound visualization, a 12 gauge CHAI device was
used to perform biopsy of the RIGHT breast mass at the 2 o'clock
axis using a lateral approach. At the conclusion of the procedure
ribbon shaped tissue marker clip was deployed into the biopsy
cavity.

Site 2:

Lesion quadrant: RIGHT axilla

Using sterile technique and 1% Lidocaine as local anesthetic, under
direct ultrasound visualization, a 14 gauge CHAI device was
used to perform biopsy of the markedly enlarged lymph node in the
RIGHT axilla using a lateral approach. At the conclusion of the
procedure CHAI tissue marker clip was deployed into the biopsy
cavity.

Follow up 2 view mammogram was performed and dictated separately.
IMPRESSION: 1. Ultrasound guided biopsy of the RIGHT breast mass at the 2
o'clock axis. No apparent complications.
2. Ultrasound guided biopsy of the markedly enlarged lymph node in
the RIGHT axilla. No apparent complications.

ADDENDUM:
Pathology revealed INVASIVE DUCTAL CARCINOMA of the RIGHT breast, 2
o'clock, (ribbon clip). This was found to be concordant by Dr. CHAI
CHAI.

Pathology revealed INVASIVE DUCTAL CARCINOMA of the RIGHT axilla,
(tribell clip). This likely represents metastatic carcinoma
involving a lymph node. This was found to be concordant by Dr. CHAI
CHAI.

Pathology results were discussed with the patient by telephone. The
patient reported doing well after the biopsies with tenderness at
the sites. Post biopsy instructions and care were reviewed and
questions were answered. The patient was encouraged to call The
direct phone number was provided.

Surgical consultation has been arranged with Dr. CHAI
at [REDACTED] on [DATE].

Medical oncology consultation has been arranged with Dr. CHAI
CHAI at [HOSPITAL] [HOSPITAL] on [DATE].

Recommendation for a bilateral breast MRI given the patient's age,
extreme breast density, for further evaluation of the deep axillary
structures and to exclude contralateral disease.

Pathology results reported by CHAI, RN on [DATE].



Site 1:

Lesion quadrant: Upper outer quadrant

Using sterile technique and 1% Lidocaine as local anesthetic, under
direct ultrasound visualization, a 12 gauge CHAI device was
used to perform biopsy of the RIGHT breast mass at the 2 o'clock
axis using a lateral approach. At the conclusion of the procedure
ribbon shaped tissue marker clip was deployed into the biopsy
cavity.

Site 2:

Lesion quadrant: RIGHT axilla

Using sterile technique and 1% Lidocaine as local anesthetic, under
direct ultrasound visualization, a 14 gauge CHAI device was
used to perform biopsy of the markedly enlarged lymph node in the
RIGHT axilla using a lateral approach. At the conclusion of the
procedure CHAI tissue marker clip was deployed into the biopsy
cavity.

Follow up 2 view mammogram was performed and dictated separately.
IMPRESSION: 1. Ultrasound guided biopsy of the RIGHT breast mass at the 2
o'clock axis. No apparent complications.
2. Ultrasound guided biopsy of the markedly enlarged lymph node in
the RIGHT axilla. No apparent complications.

## 2021-11-05 ENCOUNTER — Telehealth: Payer: Self-pay | Admitting: Hematology and Oncology

## 2021-11-05 NOTE — Telephone Encounter (Signed)
Scheduled appt per 11/10 staff msg. Pt is aware of appt date and time.

## 2021-11-06 ENCOUNTER — Encounter: Payer: Self-pay | Admitting: Diagnostic Radiology

## 2021-11-06 NOTE — Progress Notes (Signed)
Patient returns after US guided biopsies of right breast and axilla. She describes burning of the lateral right breast, arm, and right side of the neck.   She also describes redness at the biopsy site in the right breast.  Has not breast fed since biopsy.  On exam, the right axilla has healing biopsy site without redness. Medial right breast 6-7 cm area of faint erythema.  I palpate no hematoma in the right axilla or right breast.  A/P:  Patient sees oncology next Wednesday, and can discuss the burning sensation.  Unlikely to be related to the biopsy.  Early right breast mastitis.  Given prescription for dicloxacillin x 7 days.  Also encouraged to breast feed or pump bilaterally.

## 2021-11-10 NOTE — Progress Notes (Signed)
[facility] CONSULT NOTE  Patient Care Team: [name], MD as PCP - General (Family Medicine)  CHIEF COMPLAINTS/PURPOSE OF CONSULTATION:  Newly diagnosed breast cancer  HISTORY OF PRESENTING ILLNESS:  Sydney Rios 40 y.o. female is here because of recent diagnosis of invasive ductal carcinoma of the right breast. She presented with a palpable, painful right axillary lump and a palpable right breast lump. Diagnostic mammogram and [place] on 11/03/2021 showed suspicious right breast mass at the 2 o'clock position 3 cm from the nipple, and suspicious right axillary lymph node. Biopsy on 11/04/2021 showed invasive ductal carcinoma present in the right breast and right axillary lymph node. She presents to the clinic today for initial evaluation and discussion of treatment options.   I reviewed her records extensively and collaborated the history with the patient.  SUMMARY OF ONCOLOGIC HISTORY: Oncology History  Malignant neoplasm of upper-outer quadrant of right breast in female, estrogen receptor negative ([place])  11/04/2021 Initial Diagnosis    Patient has been breast-feeding.  Felt a painful right breast mass and an axillary mass.  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 with a Ki-67 of 40%, 1 abnormal lymph node 5.8 cm biopsy: Grade 3 IDC ER 5%, PR 0%, HER2 negative, Ki-67 40%   11/11/2021 Cancer Staging   Staging form: Breast, AJCC 8th Edition - Clinical stage from 11/11/2021: Stage IIB (cT2, cN1, cM0, G3, ER+, PR+, HER2-) - Signed by Nicholas Lose, MD on 11/11/2021 Stage prefix: Initial diagnosis Histologic grading system: 3 grade system      MEDICAL HISTORY:  Past Medical History:  Diagnosis Date   Anxiety    Chronic kidney disease    hx kidney stones   Complication of anesthesia    runs heart rate in the 30-40s in PACU   History of kidney stones    Hx gestational diabetes    Hx of Hashimoto thyroiditis    No  pertinent past medical history    PONV (postoperative nausea and vomiting)     SURGICAL HISTORY: Past Surgical History:  Procedure Laterality Date   CESAREAN SECTION     CESAREAN SECTION  01/23/2013   Procedure: CESAREAN SECTION;  Surgeon: Luz Lex, MD;  Location: [facility];  Service: Obstetrics;  Laterality: N/A;   CESAREAN SECTION N/A 10/11/2014   Procedure: CESAREAN SECTION;  Surgeon: Cyril Mourning, MD;  Location: [facility];  Service: Obstetrics;  Laterality: N/A;   CESAREAN SECTION N/A 10/24/2018   Procedure: REPEAT CESAREAN SECTION;  Surgeon: Dian Queen, MD;  Location: [facility];  Service: Obstetrics;  Laterality: N/A;  Tracey RNFA   LITHOTRIPSY     NO PAST SURGERIES     WISDOM TOOTH EXTRACTION      SOCIAL HISTORY: Social History   Socioeconomic History   Marital status: Married    Spouse name: Not on file   Number of children: Not on file   Years of education: Not on file   Highest education level: Not on file  Occupational History   Not on file  Tobacco Use   Smoking status: Never   Smokeless tobacco: Never  Vaping Use   Vaping Use: Never used  Substance and Sexual Activity   Alcohol use: No   Drug use: No   Sexual activity: Yes    Birth control/protection: Other-see comments    Comment: Husband has had a vasectomy  Other Topics Concern   Not on file  Social  History Narrative   Not on file   Social Determinants of Health   Financial Resource Strain: Not on file  Food Insecurity: Not on file  Transportation Needs: Not on file  Physical Activity: Not on file  Stress: Not on file  Social Connections: Not on file  Intimate Partner Violence: Not on file    FAMILY HISTORY: Family History  Problem Relation Age of Onset   Hypertension Mother    Diabetes Mother    Hypertension Father    Hypertension Maternal Grandmother    Diabetes Maternal Grandmother    Heart disease Maternal Grandfather    Hypertension Maternal Grandfather    Heart  disease Paternal Grandfather    Hypertension Paternal Grandfather     ALLERGIES:  has No Known Allergies.  MEDICATIONS:  Current Outpatient Medications  Medication Sig Dispense Refill   Ascorbic Acid (VITAMIN C) 1000 MG tablet Take 1,000 mg by mouth daily.     Cholecalciferol (VITAMIN D3) 125 MCG (5000 UT) CAPS Take by mouth.     Magnesium 400 MG TABS Take by mouth.     Multiple Vitamins-Minerals (MULTIVITAMIN ADULTS PO) Take by mouth.     valACYclovir (VALTREX) 500 MG tablet Take 500 mg by mouth 2 (two) times daily.     Zinc 100 MG TABS Take by mouth.     No current facility-administered medications for this visit.    REVIEW OF SYSTEMS:   Constitutional: Denies fevers, chills or abnormal night sweats Eyes: Denies blurriness of vision, double vision or watery eyes Ears, nose, mouth, throat, and face: Denies mucositis or sore throat Respiratory: Denies cough, dyspnea or wheezes Cardiovascular: Denies palpitation, chest discomfort or lower extremity swelling Gastrointestinal:  Denies nausea, heartburn or change in bowel habits Skin: Denies abnormal skin rashes Lymphatics: Denies new lymphadenopathy or easy bruising Neurological:Denies numbness, tingling or new weaknesses Behavioral/Psych: Mood is stable, no new changes  Breast:   All other systems were reviewed with the patient and are negative.  PHYSICAL EXAMINATION: ECOG PERFORMANCE STATUS: 2 - Symptomatic, <50% confined to bed  Vitals:   11/11/21 1506  BP: (!) 143/67  Pulse: 74  Resp: 18  Temp: 97.9 F (36.6 C)  SpO2: 100%   Filed Weights   11/11/21 1506  Weight: 151 lb 4.8 oz (68.6 kg)    GENERAL:alert, no distress and comfortable   BREAST: Large palpable right axillary lymph node, right breast lump in the medial aspect (exam performed in the presence of a chaperone)   LABORATORY DATA:  I have reviewed the data as listed Lab Results  Component Value Date   WBC 9.6 10/25/2018   HGB 9.3 (L) 10/25/2018   HCT  28.5 (L) 10/25/2018   MCV 83.6 10/25/2018   PLT 167 10/25/2018   Lab Results  Component Value Date   NA 137 10/22/2018   K 4.3 10/22/2018   CL 109 10/22/2018   CO2 19 (L) 10/22/2018    RADIOGRAPHIC STUDIES: I have personally reviewed the radiological reports and agreed with the findings in the report.  ASSESSMENT AND PLAN:  Malignant neoplasm of upper-outer quadrant of right breast in female, estrogen receptor negative ([place]) 11/04/2021: Patient has been breast-feeding.  Felt a painful right breast mass and an axillary mass.  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 with a Ki-67 of 40%, 1 abnormal lymph node 5.8 cm biopsy: Grade 3 IDC ER 5%, PR 0%, HER2 negative, Ki-67 40%  Pathology and radiology counseling: Discussed  with the patient, the details of pathology including the type of breast cancer,the clinical staging, the significance of ER, PR and HER-2/neu receptors and the implications for treatment. After reviewing the pathology in detail, we proceeded to discuss the different treatment options between surgery, radiation, chemotherapy, antiestrogen therapies.  Recommendation based on multidisciplinary tumor board: 1. Neoadjuvant chemotherapy with Taxol weekly 12 with Keytruda and carboplatin every 3 weeks x4 Adriamycin and Cytoxan, Keytruda dose dense 4 followed by  followed by Beryle Flock maintenance for 1 year 2. Followed by breast conserving surgery with targeted axillary dissection 3. Followed by adjuvant radiation therapy  Chemotherapy Counseling: I discussed the risks and benefits of chemotherapy including the risks of nausea/ vomiting, risk of infection from low WBC count, fatigue due to chemo or anemia, bruising or bleeding due to low platelets, mouth sores, loss/ change in taste and decreased appetite. Liver and kidney function will be monitored through out chemotherapy as abnormalities in liver and kidney function may be a side effect  of treatment.  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 Risk of permanent bone marrow dysfunction due to chemo were also discussed.  Plan: 1. Port placement to be done next Monday 2. Echocardiogram 3. Chemotherapy class 4. Breast MRI 5. CT chest abdomen pelvis and bone scan for staging Genetic counseling will also be arranged  [facility] 16070: Treatment of refractory nausea.  After first cycle of chemo if patient experience chemo induced nausea and vomiting the randomized from cycle 2 to Aloxi plus Dex plus olanzapine or placebo plus Compazine or placebo plus placebo prior to chemo and take home medications for day 2 today for of Dex plus olanzapine or placebo and Compazine or placebo every 8 hours.  If patient does not have nausea after cycle 1, then the trial is complete.  Return to clinic in 1-2 weeks to start chemotherapy.   All questions were answered. The patient knows to call the clinic with any problems, questions or concerns.   Rulon Eisenmenger, MD, MPH 11/11/2021    I, Thana Ates, am acting as scribe for Nicholas Lose, MD.  I have reviewed the above documentation for accuracy and completeness, and I agree with the above.

## 2021-11-11 ENCOUNTER — Encounter: Payer: Self-pay | Admitting: *Deleted

## 2021-11-11 ENCOUNTER — Other Ambulatory Visit: Payer: Self-pay

## 2021-11-11 ENCOUNTER — Other Ambulatory Visit: Payer: Self-pay | Admitting: Hematology and Oncology

## 2021-11-11 ENCOUNTER — Ambulatory Visit: Payer: Managed Care, Other (non HMO) | Attending: Hematology and Oncology | Admitting: Physical Therapy

## 2021-11-11 ENCOUNTER — Encounter: Payer: Self-pay | Admitting: Physical Therapy

## 2021-11-11 ENCOUNTER — Inpatient Hospital Stay (HOSPITAL_BASED_OUTPATIENT_CLINIC_OR_DEPARTMENT_OTHER): Payer: Managed Care, Other (non HMO) | Admitting: Hematology and Oncology

## 2021-11-11 DIAGNOSIS — R293 Abnormal posture: Secondary | ICD-10-CM | POA: Insufficient documentation

## 2021-11-11 DIAGNOSIS — R112 Nausea with vomiting, unspecified: Secondary | ICD-10-CM | POA: Insufficient documentation

## 2021-11-11 DIAGNOSIS — Z171 Estrogen receptor negative status [ER-]: Secondary | ICD-10-CM | POA: Insufficient documentation

## 2021-11-11 DIAGNOSIS — C50211 Malignant neoplasm of upper-inner quadrant of right female breast: Secondary | ICD-10-CM | POA: Insufficient documentation

## 2021-11-11 DIAGNOSIS — C50411 Malignant neoplasm of upper-outer quadrant of right female breast: Secondary | ICD-10-CM | POA: Insufficient documentation

## 2021-11-11 DIAGNOSIS — E063 Autoimmune thyroiditis: Secondary | ICD-10-CM | POA: Insufficient documentation

## 2021-11-11 MED ORDER — PROCHLORPERAZINE MALEATE 10 MG PO TABS: 10.0000 mg | ORAL_TABLET | Freq: Four times a day (QID) | ORAL | 1 refills | Status: DC | PRN

## 2021-11-11 MED ORDER — ONDANSETRON HCL 8 MG PO TABS: 8.0000 mg | ORAL_TABLET | Freq: Two times a day (BID) | ORAL | 1 refills | Status: DC | PRN

## 2021-11-11 MED ORDER — LIDOCAINE-PRILOCAINE 2.5-2.5 % EX CREA: TOPICAL_CREAM | CUTANEOUS | 3 refills | Status: DC

## 2021-11-11 NOTE — Assessment & Plan Note (Signed)
11/04/2021: Patient has been breast-feeding.  Felt a painful right breast mass and an axillary mass.  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 with a Ki-67 of 40%, 1 abnormal lymph node 5.8 cm biopsy: Grade 3 IDC ER 5%, PR 0%, HER2 negative, Ki-67 40%  Pathology and radiology counseling: Discussed with the patient, the details of pathology including the type of breast cancer,the clinical staging, the significance of ER, PR and HER-2/neu receptors and the implications for treatment. After reviewing the pathology in detail, we proceeded to discuss the different treatment options between surgery, radiation, chemotherapy, antiestrogen therapies.  Recommendation based on multidisciplinary tumor board: 1. Neoadjuvant chemotherapy with Adriamycin and Cytoxan, Keytruda dose dense 4 followed by Taxol weekly 12 with Keytruda and carboplatin every 3 weeks x4 followed by Keytruda maintenance for 1 year 2. Followed by breast conserving surgery with sentinel lymph node study vs targeted axillary dissection 3. Followed by adjuvant radiation therapy  Chemotherapy Counseling: I discussed the risks and benefits of chemotherapy including the risks of nausea/ vomiting, risk of infection from low WBC count, fatigue due to chemo or anemia, bruising or bleeding due to low platelets, mouth sores, loss/ change in taste and decreased appetite. Liver and kidney function will be monitored through out chemotherapy as abnormalities in liver and kidney function may be a side effect of treatment.  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 Risk of permanent bone marrow dysfunction due to chemo were also discussed.  Plan: 1. Port placement to be done next Monday 2. Echocardiogram 3. Chemotherapy class 4. Breast MRI 5. CT chest abdomen pelvis and bone scan for staging Genetic counseling will also be arranged  [facility] 16070:  Treatment of refractory nausea.  After first cycle of chemo if patient experience chemo induced nausea and vomiting the randomized from cycle 2 to Aloxi plus Dex plus olanzapine or placebo plus Compazine or placebo plus placebo prior to chemo and take home medications for day 2 today for of Dex plus olanzapine or placebo and Compazine or placebo every 8 hours.  If patient does not have nausea after cycle 1, then the trial is complete.  Return to clinic in 1 week to start chemotherapy. 

## 2021-11-11 NOTE — Research (Signed)
[facility] 16070 Refractory Nausea   Patient TANAZIA ACHEE was identified by Dr Lindi Adie as a potential candidate for the above listed study.  This Clinical Research Nurse met with Sydney Rios, [id number], on 11/11/21 in a manner and location that ensures patient privacy to discuss participation in the above listed research study.  Patient is Accompanied by her husband .  A copy of the informed consent document and separate HIPAA Authorization was provided to the patient.  Patient reads, speaks, and understands [place].   Patient was provided with the business card of this Nurse and encouraged to contact the research team with any questions.  Approximately 15 minutes were spent with the patient reviewing the informed consent documents.  Patient was provided the option of taking informed consent documents home to review and was encouraged to review at their convenience with their support network, including other care providers. Patient took the consent documents home to review.  Agreed for a follow up phone call early next week to discuss further involvement and answer other questions.   Marjie Skiff Shaneeka Scarboro, RN, BSN, [place] Direct Dial [phone]  Pager [phone] 11/11/2021 4:52 PM

## 2021-11-11 NOTE — Therapy (Signed)
[facility] [place], [place], [address] Phone: [phone]   Fax:  [phone]  Physical Therapy Evaluation  Patient Details  Name: Sydney Rios MRN: [id number] Date of Birth: 03-01-1981 Referring Provider (PT): Dr. Nicholas Lose   Encounter Date: 11/11/2021   PT End of Session - 11/11/21 1938     Visit Number 1    Number of Visits 2    Date for PT Re-Evaluation 05/11/22    PT Start Time 1540    PT Stop Time 1612    PT Time Calculation (min) 32 min    Activity Tolerance Patient tolerated treatment well    Behavior During Therapy [facility] for tasks assessed/performed             Past Medical History:  Diagnosis Date   Anxiety    Chronic kidney disease    hx kidney stones   Complication of anesthesia    runs heart rate in the 30-40s in PACU   History of kidney stones    Hx gestational diabetes    Hx of Hashimoto thyroiditis    No pertinent past medical history    PONV (postoperative nausea and vomiting)     Past Surgical History:  Procedure Laterality Date   CESAREAN SECTION     CESAREAN SECTION  01/23/2013   Procedure: CESAREAN SECTION;  Surgeon: Luz Lex, MD;  Location: [facility];  Service: Obstetrics;  Laterality: N/A;   CESAREAN SECTION N/A 10/11/2014   Procedure: CESAREAN SECTION;  Surgeon: Cyril Mourning, MD;  Location: [facility];  Service: Obstetrics;  Laterality: N/A;   CESAREAN SECTION N/A 10/24/2018   Procedure: REPEAT CESAREAN SECTION;  Surgeon: Dian Queen, MD;  Location: [facility];  Service: Obstetrics;  Laterality: N/A;  Tracey RNFA   LITHOTRIPSY     NO PAST SURGERIES     WISDOM TOOTH EXTRACTION      There were no vitals filed for this visit.    Subjective Assessment - 11/11/21 1928     Subjective Patient reports she is here today to be seen by her oncologist and other medical providers for her newly diagnosed right breast cancer.    Patient is accompained by:  Family member    Pertinent History Patient was diagnosed on 11/05/2021 with right grade III invasive ductal carcinoma breast cancer. She has a large axillary lymph node that was biopsied and positive for disease. It is triple negative with a Ki67 of 40%. She has no other medical problems.    Patient Stated Goals Reduce lymphedema risk and learn post op HEP    Currently in Pain? No/denies   Reports intermittent right shoulder pain               [facility] PT Assessment - 11/11/21 0001       Assessment   Medical Diagnosis Right breast cancer    Referring Provider (PT) Dr. Nicholas Lose    Onset Date/Surgical Date 11/05/21    Hand Dominance Right    Prior Therapy none      Precautions   Precautions Other (comment)    Precaution Comments active cancer      Restrictions   Weight Bearing Restrictions No      Balance Screen   Has the patient fallen in the past 6 months No    Has the patient had a decrease in activity level because of a fear of falling?  No    Is the patient  reluctant to leave their home because of a fear of falling?  No      Home Social worker Private residence    Living Arrangements Spouse/significant other;Children   Husband and 4 kids ages 62-11   Available Help at Discharge Family      Prior Function   Level of Independence Independent    Vocation Unemployed    [place] her 3 older children    Leisure She does not exercise      Cognition   Overall Cognitive Status Within Functional Limits for tasks assessed      Posture/Postural Control   Posture/Postural Control Postural limitations    Postural Limitations Forward head;Rounded Shoulders      ROM / Strength   AROM / PROM / Strength AROM;Strength      AROM   Overall AROM Comments Cervical AROM is WNL    AROM Assessment Site Shoulder    Right/Left Shoulder Right;Left    Right Shoulder Extension 53 Degrees    Right Shoulder Flexion 154 Degrees    Right  Shoulder ABduction 165 Degrees    Right Shoulder Internal Rotation 72 Degrees    Right Shoulder External Rotation 82 Degrees    Left Shoulder Extension 57 Degrees    Left Shoulder Flexion 143 Degrees    Left Shoulder ABduction 163 Degrees    Left Shoulder Internal Rotation 69 Degrees    Left Shoulder External Rotation 77 Degrees      Strength   Overall Strength Within functional limits for tasks performed               LYMPHEDEMA/ONCOLOGY QUESTIONNAIRE - 11/11/21 0001       Type   Cancer Type Right breast cancer      Lymphedema Assessments   Lymphedema Assessments Upper extremities      Right Upper Extremity Lymphedema   10 cm Proximal to Olecranon Process 25.8 cm    Olecranon Process 24.2 cm    10 cm Proximal to Ulnar Styloid Process 19.6 cm    Just Proximal to Ulnar Styloid Process 14.1 cm    Across Hand at [employer] 18 cm    At [place] of 2nd Digit 5.4 cm      Left Upper Extremity Lymphedema   10 cm Proximal to Olecranon Process 25.9 cm    Olecranon Process 24.1 cm    10 cm Proximal to Ulnar Styloid Process 18.8 cm    Just Proximal to Ulnar Styloid Process 13.9 cm    Across Hand at [employer] 17.4 cm    At [place] of 2nd Digit 5.3 cm             L-DEX FLOWSHEETS - 11/11/21 1900       L-DEX LYMPHEDEMA SCREENING   Measurement Type Unilateral    L-DEX MEASUREMENT EXTREMITY Upper Extremity    POSITION  Standing    DOMINANT SIDE Right    At Risk Side Right    BASELINE SCORE (UNILATERAL) 2.3             The patient was assessed using the L-Dex machine today to produce a lymphedema index baseline score. The patient will be reassessed on a regular basis (typically every 3 months) to obtain new L-Dex scores. If the score is > 6.5 points away from his/her baseline score indicating onset of subclinical lymphedema, it will be recommended to wear a compression garment for 4 weeks, 12 hours per day and then be  reassessed. If the score continues to be > 6.5  points from baseline at reassessment, we will initiate lymphedema treatment. Assessing in this manner has a 95% rate of preventing clinically significant lymphedema.      [name] - 11/11/21 0001     Open a tight or new jar No difficulty    Do heavy household chores (wash walls, wash floors) No difficulty    Carry a shopping bag or briefcase No difficulty    Wash your back No difficulty    Use a knife to cut food No difficulty    Recreational activities in which you take some force or impact through your arm, shoulder, or hand (golf, hammering, tennis) No difficulty    During the past week, to what extent has your arm, shoulder or hand problem interfered with your normal social activities with family, friends, neighbors, or groups? Not at all    During the past week, to what extent has your arm, shoulder or hand problem limited your work or other regular daily activities Not at all    Arm, shoulder, or hand pain. Mild    Tingling (pins and needles) in your arm, shoulder, or hand Mild    Difficulty Sleeping No difficulty    DASH Score 4.55 %              Objective measurements completed on examination: See above findings.           Patient was instructed today in a home exercise program today for post op shoulder range of motion. These included active assist shoulder flexion in sitting, scapular retraction, wall walking with shoulder abduction, and hands behind head external rotation.  She was encouraged to do these twice a day, holding 3 seconds and repeating 5 times when permitted by her physician.       PT Education - 11/11/21 1937     Education Details Lymphedema education and post op HEP    Person(s) Educated Patient;Spouse    Methods Explanation;Demonstration;Handout    Comprehension Returned demonstration;Verbalized understanding                 PT Long Term Goals - 11/11/21 1944       PT LONG TERM GOAL #1   Title Patient will demonstrate she has  regained full shouder ROM and function post operatively compared to baselines.    Time 6    Period Months    Status New    Target Date 05/11/22             Breast Clinic Goals - 11/11/21 1944       Patient will be able to verbalize understanding of pertinent lymphedema risk reduction practices relevant to her diagnosis specifically related to skin care.   Time 1    Period Days    Status Achieved      Patient will be able to return demonstrate and/or verbalize understanding of the post-op home exercise program related to regaining shoulder range of motion.   Baseline 1    Period Days    Status Achieved      Patient will be able to verbalize understanding of the importance of attending the postoperative After Breast Cancer Class for further lymphedema risk reduction education and therapeutic exercise.   Time 1    Period Days    Status Achieved                   Plan - 11/11/21 1939  Clinical Impression Statement Patient was diagnosed on 11/05/2021 with right grade III invasive ductal carcinoma breast cancer. She has a large axillary lymph node that was biopsied and positive for disease. It is triple negative with a Ki67 of 40%. She has no other medical problems. Her multidisciplinary medical team met priot o her assessments to determine a recommended treatment plan. She is planning to have neoadjuvant chemotherapy possible followed by a right lumpectomy and targeted node dissection (dependent on genetic testing and her personal decision), and radiation. She will benefit from a post op PT reassessment to determine needs and from L-Dex screens every 3 months for 2 years to detect subclinical lymphedema.    Stability/Clinical Decision Making Stable/Uncomplicated    Clinical Decision Making Low    Rehab Potential Excellent    PT Frequency --   Eval and 1 f/u visit   PT Treatment/Interventions ADLs/Self Care Home Management;Therapeutic exercise;Patient/family education     PT Next Visit Plan Will reassess 3-4 weeks post op    PT Home Exercise Plan Post op shoulder ROM HEP    Consulted and Agree with Plan of Care Patient;Family member/caregiver    Family Member Consulted Husband             Patient will benefit from skilled therapeutic intervention in order to improve the following deficits and impairments:  Postural dysfunction, Decreased range of motion, Decreased knowledge of precautions, Impaired UE functional use, Pain  Visit Diagnosis: Malignant neoplasm of upper-inner quadrant of right breast in female, estrogen receptor negative ([place]) - Plan: PT plan of care cert/re-cert  Abnormal posture - Plan: PT plan of care cert/re-cert   Patient will follow up at outpatient cancer rehab 3-4 weeks following surgery.  If the patient requires physical therapy at that time, a specific plan will be dictated and sent to the referring physician for approval. The patient was educated today on appropriate basic range of motion exercises to begin post operatively and the importance of attending the After Breast Cancer class following surgery.  Patient was educated today on lymphedema risk reduction practices as it pertains to recommendations that will benefit the patient immediately following surgery.  She verbalized good understanding.     Problem List Patient Active Problem List   Diagnosis Date Noted   Malignant neoplasm of upper-outer quadrant of right breast in female, estrogen receptor negative ([place]) 11/11/2021   Abnormal maternal glucose tolerance, antepartum 09/15/2018   S/P cesarean section 10/11/2014   TACHYCARDIA 05/01/2010   Annia Friendly, PT 11/11/21 7:48 PM   [facility] [place], [place], [address] Phone: [phone]   Fax:  [phone]  Name: Sydney Rios MRN: [id number] Date of Birth: Oct 09, 1981

## 2021-11-11 NOTE — Progress Notes (Signed)
START ON PATHWAY REGIMEN - Breast     Cycles 1 through 4: A cycle is every 21 days:     Pembrolizumab      Paclitaxel      Carboplatin      Filgrastim-xxxx    Cycles 5 through 8: A cycle is every 21 days:     Pembrolizumab      Doxorubicin      Cyclophosphamide      Pegfilgrastim-xxxx   **Always confirm dose/schedule in your pharmacy ordering system**  Patient Characteristics: Preoperative or Nonsurgical Candidate (Clinical Staging), Neoadjuvant Therapy followed by Surgery, Invasive Disease, Chemotherapy, HER2 Negative/Unknown/Equivocal, ER Negative/Unknown, Platinum Therapy Indicated and Candidate for Checkpoint Inhibitor Therapeutic Status: Preoperative or Nonsurgical Candidate (Clinical Staging) AJCC M Category: cM0 AJCC Grade: G3 Breast Surgical Plan: Neoadjuvant Therapy followed by Surgery ER Status: Negative (-) AJCC 8 Stage Grouping: IIIB HER2 Status: Negative (-) AJCC T Category: cT2 AJCC N Category: cN1 PR Status: Negative (-) Intent of Therapy: Curative Intent, Discussed with Patient 

## 2021-11-11 NOTE — Patient Instructions (Signed)

## 2021-11-12 ENCOUNTER — Inpatient Hospital Stay: Payer: Managed Care, Other (non HMO)

## 2021-11-12 ENCOUNTER — Other Ambulatory Visit: Payer: Self-pay | Admitting: Genetic Counselor

## 2021-11-12 ENCOUNTER — Encounter: Payer: Self-pay | Admitting: Genetic Counselor

## 2021-11-12 ENCOUNTER — Inpatient Hospital Stay (HOSPITAL_BASED_OUTPATIENT_CLINIC_OR_DEPARTMENT_OTHER): Payer: Managed Care, Other (non HMO) | Admitting: Genetic Counselor

## 2021-11-12 DIAGNOSIS — Z171 Estrogen receptor negative status [ER-]: Secondary | ICD-10-CM | POA: Diagnosis not present

## 2021-11-12 DIAGNOSIS — C50411 Malignant neoplasm of upper-outer quadrant of right female breast: Secondary | ICD-10-CM | POA: Diagnosis not present

## 2021-11-12 DIAGNOSIS — C50211 Malignant neoplasm of upper-inner quadrant of right female breast: Secondary | ICD-10-CM | POA: Diagnosis not present

## 2021-11-12 LAB — TSH
TSH: 1.139 u[IU]/mL (ref 0.308–3.960)
TSH: 1.184 u[IU]/mL (ref 0.308–3.960)

## 2021-11-12 LAB — CBC WITH DIFFERENTIAL (CANCER CENTER ONLY)
Abs Immature Granulocytes: 0 10*3/uL (ref 0.00–0.07)
Basophils Absolute: 0 10*3/uL (ref 0.0–0.1)
Basophils Relative: 0 %
Eosinophils Absolute: 0 10*3/uL (ref 0.0–0.5)
Eosinophils Relative: 1 %
HCT: 41.2 % (ref 36.0–46.0)
Hemoglobin: 13.5 g/dL (ref 12.0–15.0)
Immature Granulocytes: 0 %
Lymphocytes Relative: 21 %
Lymphs Abs: 0.9 10*3/uL (ref 0.7–4.0)
MCH: 29.7 pg (ref 26.0–34.0)
MCHC: 32.8 g/dL (ref 30.0–36.0)
MCV: 90.7 fL (ref 80.0–100.0)
Monocytes Absolute: 0.3 10*3/uL (ref 0.1–1.0)
Monocytes Relative: 6 %
Neutro Abs: 3.3 10*3/uL (ref 1.7–7.7)
Neutrophils Relative %: 72 %
Platelet Count: 200 10*3/uL (ref 150–400)
RBC: 4.54 MIL/uL (ref 3.87–5.11)
RDW: 13.5 % (ref 11.5–15.5)
WBC Count: 4.5 10*3/uL (ref 4.0–10.5)
nRBC: 0 % (ref 0.0–0.2)

## 2021-11-12 LAB — CMP (CANCER CENTER ONLY)
ALT: 17 U/L (ref 0–44)
AST: 16 U/L (ref 15–41)
Albumin: 4.7 g/dL (ref 3.5–5.0)
Alkaline Phosphatase: 45 U/L (ref 38–126)
Anion gap: 11 (ref 5–15)
BUN: 8 mg/dL (ref 6–20)
CO2: 25 mmol/L (ref 22–32)
Calcium: 9.9 mg/dL (ref 8.9–10.3)
Chloride: 105 mmol/L (ref 98–111)
Creatinine: 0.79 mg/dL (ref 0.44–1.00)
GFR, Estimated: >60 mL/min (ref >60)
Glucose, Bld: 116 mg/dL — ABNORMAL HIGH (ref 70–99)
Potassium: 3.9 mmol/L (ref 3.5–5.1)
Sodium: 141 mmol/L (ref 135–145)
Total Bilirubin: 0.4 mg/dL (ref 0.3–1.2)
Total Protein: 7.3 g/dL (ref 6.5–8.1)

## 2021-11-12 LAB — GENETIC SCREENING ORDER

## 2021-11-12 NOTE — Progress Notes (Addendum)
REFERRING PROVIDER: Nicholas Lose, MD [place],  [place] [id number]  PRIMARY PROVIDER:  [name], MD  PRIMARY REASON FOR VISIT:  1. Malignant neoplasm of upper-outer quadrant of right breast in female, estrogen receptor negative ([place])      HISTORY OF PRESENT ILLNESS:   Sydney Rios, a 40 y.o. female, was seen for a  cancer genetics consultation at the request of Dr. Lindi Adie due to a personal history of breast cancer.  Sydney Rios presents to clinic today to discuss the possibility of a hereditary predisposition to cancer, genetic testing, and to further clarify her future cancer risks, as well as potential cancer risks for family members.   In November 2022, at the age of 40, Sydney Rios was diagnosed with triple negative cancer of the right breast. The treatment plan includes chemotherapy, surgery and radiation.    CANCER HISTORY:  Oncology History  Malignant neoplasm of upper-outer quadrant of right breast in female, estrogen receptor negative ([place])  11/04/2021 Initial Diagnosis    Patient has been breast-feeding.  Felt a painful right breast mass and an axillary mass.  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 with a Ki-67 of 40%, 1 abnormal lymph node 5.8 cm biopsy: Grade 3 IDC ER 5%, PR 0%, HER2 negative, Ki-67 40%   11/11/2021 Cancer Staging   Staging form: Breast, AJCC 8th Edition - Clinical stage from 11/11/2021: Stage IIB (cT2, cN1, cM0, G3, ER+, PR+, HER2-) - Signed by Nicholas Lose, MD on 11/11/2021 Stage prefix: Initial diagnosis Histologic grading system: 3 grade system    11/23/2021 -  Chemotherapy   Patient is on Treatment Plan : BREAST Pembrolizumab + Carboplatin D1 + Paclitaxel D1,8,15 q21d X 4 cycles / Pembrolizumab + AC q21d x 4 cycles        RISK FACTORS:  Menarche was at age 32.  First live birth at age 71.  OCP use for approximately  15  years.  Ovaries intact: yes.   Hysterectomy: no.  Menopausal status: premenopausal.  HRT use: 0 years. Colonoscopy: no; not examined. Mammogram within the last year: yes. Number of breast biopsies: 1. Up to date with pelvic exams: yes. Any excessive radiation exposure in the past: no  Past Medical History:  Diagnosis Date   Anxiety    Chronic kidney disease    hx kidney stones   Complication of anesthesia    runs heart rate in the 30-40s in PACU   History of kidney stones    Hx gestational diabetes    Hx of Hashimoto thyroiditis    No pertinent past medical history    PONV (postoperative nausea and vomiting)     Past Surgical History:  Procedure Laterality Date   CESAREAN SECTION     CESAREAN SECTION  01/23/2013   Procedure: CESAREAN SECTION;  Surgeon: Luz Lex, MD;  Location: [facility];  Service: Obstetrics;  Laterality: N/A;   CESAREAN SECTION N/A 10/11/2014   Procedure: CESAREAN SECTION;  Surgeon: Cyril Mourning, MD;  Location: [facility];  Service: Obstetrics;  Laterality: N/A;   CESAREAN SECTION N/A 10/24/2018   Procedure: REPEAT CESAREAN SECTION;  Surgeon: Dian Queen, MD;  Location: [facility];  Service: Obstetrics;  Laterality: N/A;  Tracey RNFA   LITHOTRIPSY     NO PAST SURGERIES     WISDOM TOOTH EXTRACTION      Social History   Socioeconomic History   Marital status: Married  Spouse name: Not on file   Number of children: Not on file   Years of education: Not on file   Highest education level: Not on file  Occupational History   Not on file  Tobacco Use   Smoking status: Never   Smokeless tobacco: Never  Vaping Use   Vaping Use: Never used  Substance and Sexual Activity   Alcohol use: No   Drug use: No   Sexual activity: Yes    Birth control/protection: Other-see comments    Comment: Husband has had a vasectomy  Other Topics Concern   Not on file  Social History Narrative   Not on file   Social Determinants of Health   Financial Resource Strain: Not on file   Food Insecurity: Not on file  Transportation Needs: Not on file  Physical Activity: Not on file  Stress: Not on file  Social Connections: Not on file     FAMILY HISTORY:  We obtained a detailed, 4-generation family history.  Significant diagnoses are listed below: Family History  Problem Relation Age of Onset   Hypertension Mother    Diabetes Mother    Hypertension Father    Hypertension Maternal Grandmother    Diabetes Maternal Grandmother    Heart disease Maternal Grandfather    Hypertension Maternal Grandfather    Heart disease Paternal Grandfather    Hypertension Paternal Grandfather     The patient has two sons and two daughters who are cancer free.  She does not have siblings. Both parents are living.  The patient's mother has two full sisters and a brother, and a maternal half brother and two sisters who are cancer free.  Her full brother has a son with retinoblastoma at 51 month of age. Maternal grandparents are deceased.  The patient's father has two brothers who are cancer free.  His father is deceased and mother is living.  Sydney Rios is unaware of previous family history of genetic testing for hereditary cancer risks. Patient's maternal ancestors are of [place] and Native American descent, and paternal ancestors are of Native American descent. There is no reported Ashkenazi Jewish ancestry. There is no known consanguinity.  GENETIC COUNSELING ASSESSMENT: Sydney Rios is a 40 y.o. female with a personal history of breast cancer which is somewhat suggestive of a hereditary breast cancer syndrome and predisposition to cancer given her young age of onset and limited family history. We, therefore, discussed and recommended the following at today's visit.   DISCUSSION: We discussed that, in general, most cancer is not inherited in families, but instead is sporadic or familial. Sporadic cancers occur by chance and typically happen at older ages (>50 years) as this type of cancer  is caused by genetic changes acquired during an individual's lifetime. Some families have more cancers than would be expected by chance; however, the ages or types of cancer are not consistent with a known genetic mutation or known genetic mutations have been ruled out. This type of familial cancer is thought to be due to a combination of multiple genetic, environmental, hormonal, and lifestyle factors. While this combination of factors likely increases the risk of cancer, the exact source of this risk is not currently identifiable or testable.  We discussed that 5 - 10% of breast cancer is hereditary, with most cases associated with BRCA mutations.  There are other genes that can be associated with hereditary breast cancer syndromes.  These include ATM, CHEK2 and PALB2.  We discussed that testing is beneficial for several reasons  including knowing how to follow individuals after completing their treatment, identifying whether potential treatment options such as PARP inhibitors would be beneficial, and understand if other family members could be at risk for cancer and allow them to undergo genetic testing.   We reviewed the characteristics, features and inheritance patterns of hereditary cancer syndromes. We also discussed genetic testing, including the appropriate family members to test, the process of testing, insurance coverage and turn-around-time for results. We discussed the implications of a negative, positive, carrier and/or variant of uncertain significant result. We recommended Sydney Rios pursue genetic testing for the CancerNext-Expanded+RNAinsight gene panel.   The CancerNext-Expanded gene panel offered by [facility] and includes sequencing and rearrangement analysis for the following 77 genes: AIP, ALK, APC*, ATM*, AXIN2, BAP1, BARD1, BLM, BMPR1A, BRCA1*, BRCA2*, BRIP1*, CDC73, CDH1*, CDK4, CDKN1B, CDKN2A, CHEK2*, CTNNA1, DICER1, FANCC, FH, FLCN, GALNT12, KIF1B, LZTR1, MAX, MEN1, MET, MLH1*,  MSH2*, MSH3, MSH6*, MUTYH*, NBN, NF1*, NF2, NTHL1, PALB2*, PHOX2B, PMS2*, POT1, PRKAR1A, PTCH1, PTEN*, RAD51C*, RAD51D*, RB1, RECQL, RET, SDHA, SDHAF2, SDHB, SDHC, SDHD, SMAD4, SMARCA4, SMARCB1, SMARCE1, STK11, SUFU, TMEM127, TP53*, TSC1, TSC2, VHL and XRCC2 (sequencing and deletion/duplication); EGFR, EGLN1, HOXB13, KIT, MITF, PDGFRA, POLD1, and POLE (sequencing only); EPCAM and GREM1 (deletion/duplication only). DNA and RNA analyses performed for * genes.  Based on Sydney Rios's personal history of cancer, she meets medical criteria for genetic testing. Despite that she meets criteria, she may still have an out of pocket cost. We discussed that if her out of pocket cost for testing is over $100, the laboratory will call and confirm whether she wants to proceed with testing.  If the out of pocket cost of testing is less than $100 she will be billed by the genetic testing laboratory.   PLAN: After considering the risks, benefits, and limitations, Sydney Rios provided informed consent to pursue genetic testing and the blood sample was sent to [facility] for analysis of the CancerNext-Expanded+RNAinsight. Results should be available within approximately 2-3 weeks' time, at which point they will be disclosed by telephone to Sydney Rios, as will any additional recommendations warranted by these results. Sydney Rios will receive a summary of her genetic counseling visit and a copy of her results once available. This information will also be available in Epic.   Lastly, we encouraged Sydney Rios to remain in contact with cancer genetics annually so that we can continuously update the family history and inform her of any changes in cancer genetics and testing that may be of benefit for this family.   Sydney Rios questions were answered to her satisfaction today. Our contact information was provided should additional questions or concerns arise. Thank you for the referral and allowing [place] to  share in the care of your patient.   Prairie Stenberg P. Florene [place], [place], [facility] Licensed, Insurance risk surveyor [email or web address] phone: [phone]  The patient was seen for a total of 35 minutes in face-to-face genetic counseling.  The patient was seen alone.  This patient was discussed with Drs. Magrinat, Lindi Adie and/or Burr Medico who agrees with the above.    _______________________________________________________________________ For Office Staff:  Number of people involved in session: 1 Was an Intern/ student involved with case: no

## 2021-11-13 ENCOUNTER — Encounter (HOSPITAL_BASED_OUTPATIENT_CLINIC_OR_DEPARTMENT_OTHER): Payer: Self-pay | Admitting: General Surgery

## 2021-11-13 ENCOUNTER — Other Ambulatory Visit: Payer: Self-pay | Admitting: General Surgery

## 2021-11-13 ENCOUNTER — Telehealth: Payer: Self-pay | Admitting: *Deleted

## 2021-11-13 ENCOUNTER — Encounter: Payer: Self-pay | Admitting: Hematology and Oncology

## 2021-11-13 LAB — T4: T4, Total: 6.6 ug/dL (ref 4.5–12.0)

## 2021-11-13 NOTE — Addendum Note (Signed)
Addended by: Tora Kindred on: 11/13/2021 10:01 AM   Modules accepted: Orders

## 2021-11-13 NOTE — Telephone Encounter (Signed)
Spoke with patient to inform of the changes to appointments regarding her scans, echo, and chemo class.  She is aware of dates and times. Encouraged her to call should she have any questions or concerns.

## 2021-11-16 ENCOUNTER — Other Ambulatory Visit: Payer: Self-pay | Admitting: Obstetrics and Gynecology

## 2021-11-16 ENCOUNTER — Ambulatory Visit
Admission: RE | Admit: 2021-11-16 | Discharge: 2021-11-16 | Disposition: A | Discharge status: Home / Self Care | Payer: Managed Care, Other (non HMO) | Source: Ambulatory Visit | Attending: Hematology and Oncology | Admitting: Hematology and Oncology

## 2021-11-16 ENCOUNTER — Other Ambulatory Visit: Payer: Self-pay

## 2021-11-16 ENCOUNTER — Encounter (HOSPITAL_BASED_OUTPATIENT_CLINIC_OR_DEPARTMENT_OTHER): Payer: Self-pay | Admitting: General Surgery

## 2021-11-16 DIAGNOSIS — Z853 Personal history of malignant neoplasm of breast: Secondary | ICD-10-CM

## 2021-11-16 DIAGNOSIS — C50411 Malignant neoplasm of upper-outer quadrant of right female breast: Secondary | ICD-10-CM

## 2021-11-16 IMAGING — MR MR BREAST BILAT WO/W CM
8 of 13 series · 32 of 48 positions shown · IV contrast (gadavist)
Comparison: Recent mammograms and ultrasounds

CLINICAL DATA: 40-year-old female with newly diagnosed RIGHT breast
cancer with metastasis to RIGHT axillary lymph node.

EXAM:
BILATERAL BREAST MRI WITH AND WITHOUT CONTRAST
TECHNIQUE: Multiplanar, multisequence MR images of both breasts were obtained
prior to and following the intravenous administration of 7 ml of
Gadavist

[Series 2: t2_tirm_tra ipat (a-p) · axial · 3.0mm · 0.70mm/px · 1 of 57 slices shown]
[im 1/57]
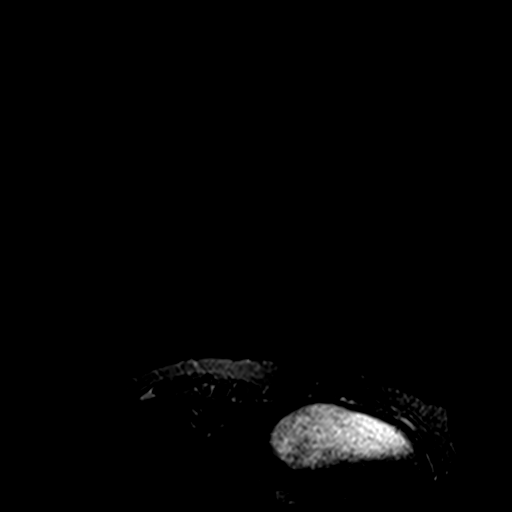

[Series 3: fl3d pre-cm no · axial · non-contrast · 1.2mm · 0.89mm/px · z∈[-99,+73]mm · 5 of 144 slices shown]
[im 1/144]
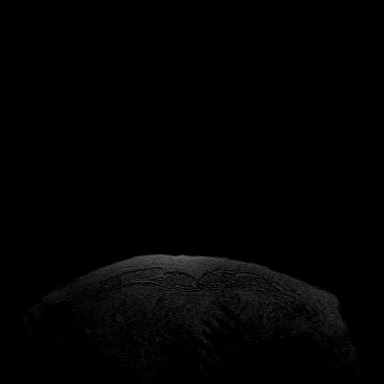
[im 36/144]
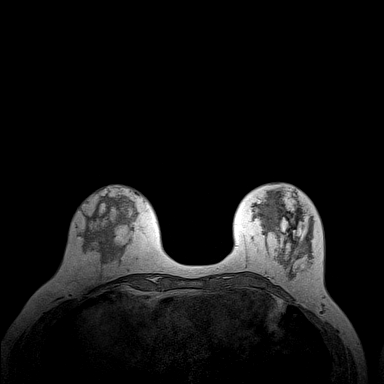
[im 72/144]
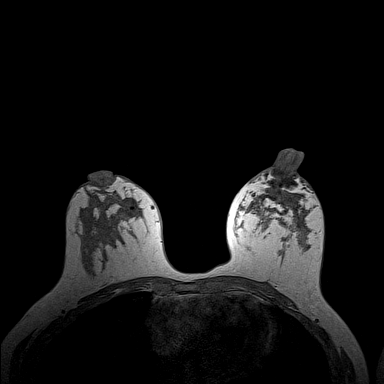
[im 108/144]
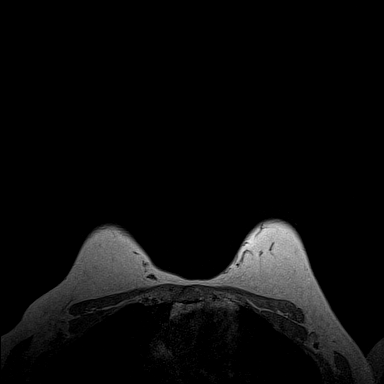
[im 144/144]
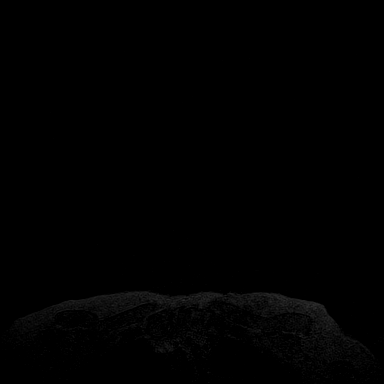

[Series 4: fl3d pre-cm · axial · non-contrast · 1.2mm · 0.89mm/px · z∈[-99,+73]mm · 5 of 144 slices shown]
[im 1/144]
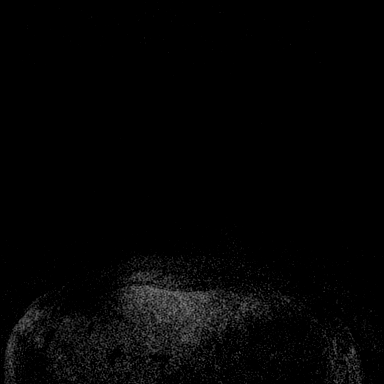
[im 36/144]
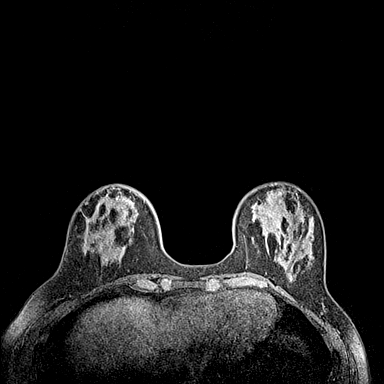
[im 72/144]
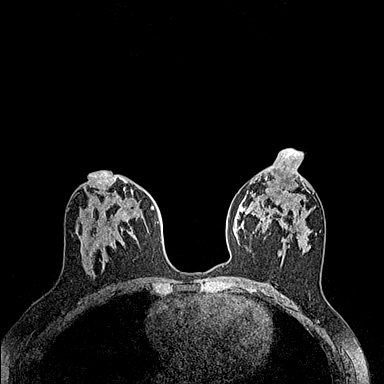
[im 108/144]
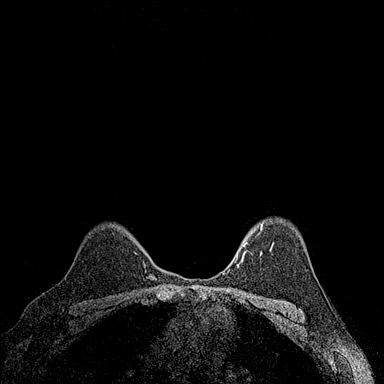
[im 144/144]
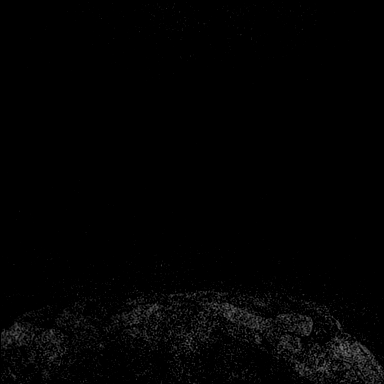

[Series 5: fl3d post-cm 20 · axial · 1.2mm · 0.89mm/px · z∈[-99,+73]mm · 5 of 144 slices shown (1 of 3)]
[im 1/144]
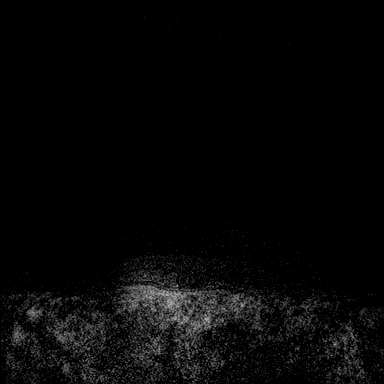
[im 36/144]
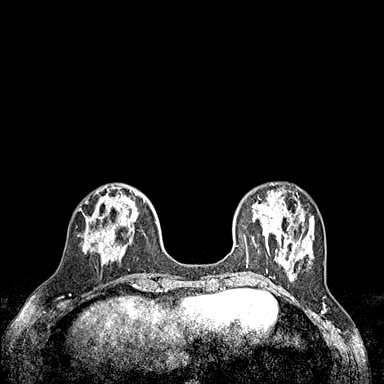
[im 72/144]
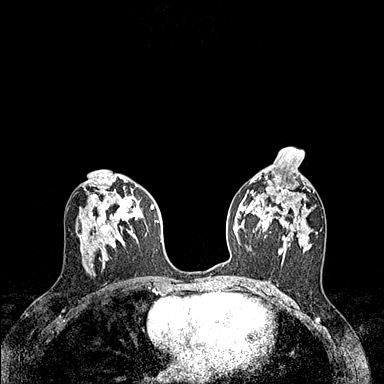
[im 108/144]
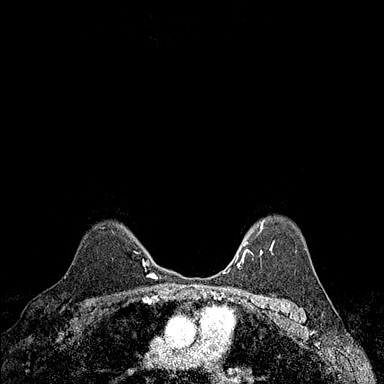
[im 144/144]
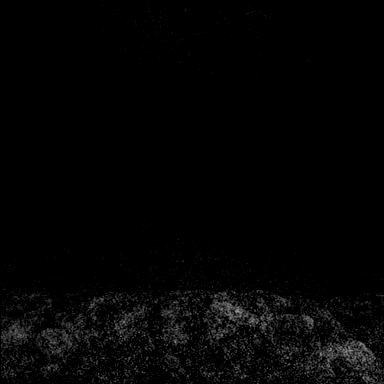

[Series 6: fl3d post-cm 20 · axial · 1.2mm · 0.89mm/px · z∈[-99,+73]mm · 5 of 144 slices shown (2 of 3)]
[im 1/144]
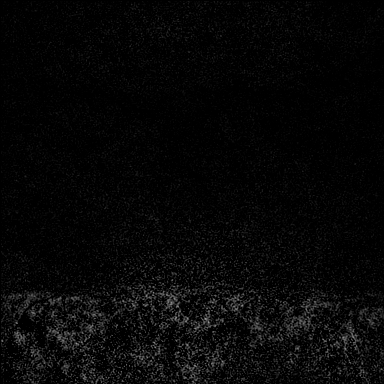
[im 36/144]
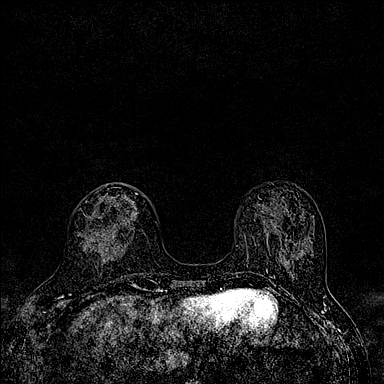
[im 72/144]
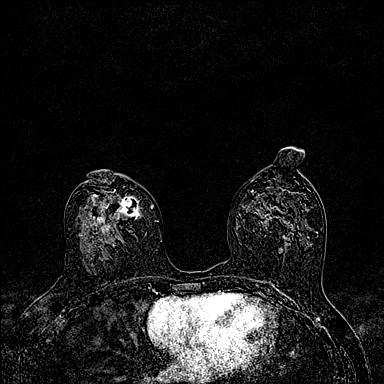
[im 108/144]
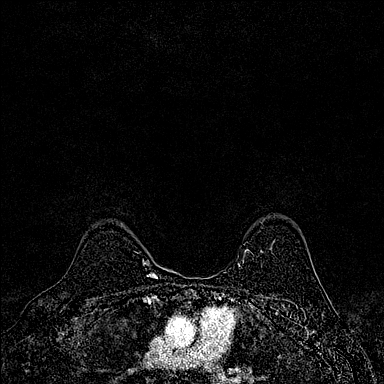
[im 144/144]
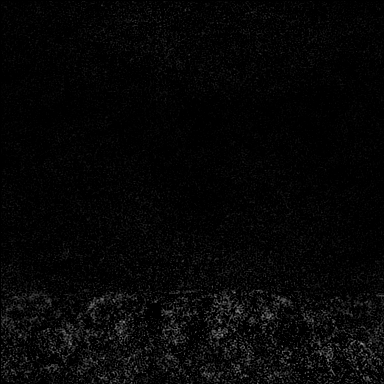

[Series 7: fl3d post-cm 20 · axial · 172.8mm · 0.89mm/px · 1 of 1 slices shown (3 of 3)]
[im 1/1]
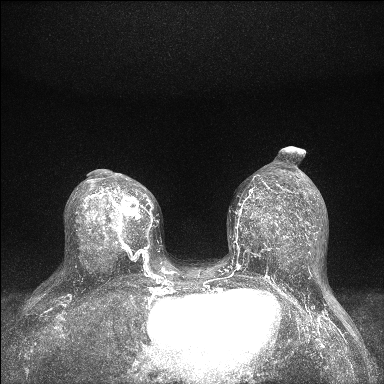

[Series 8: fl3d post-cm 3 · axial · 1.2mm · 0.89mm/px · z∈[-99,+73]mm · 5 of 144 slices shown (1 of 2)]
[im 1/144]
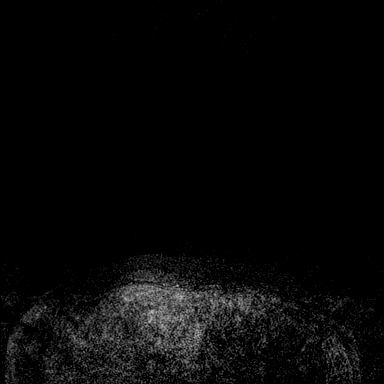
[im 36/144]
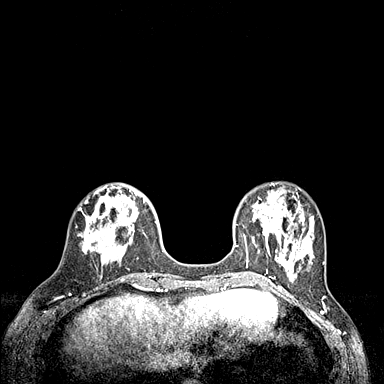
[im 72/144]
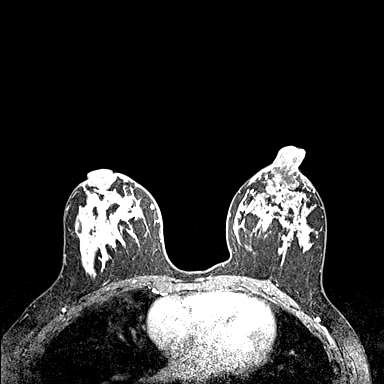
[im 108/144]
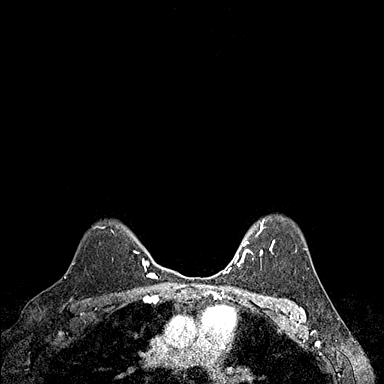
[im 144/144]
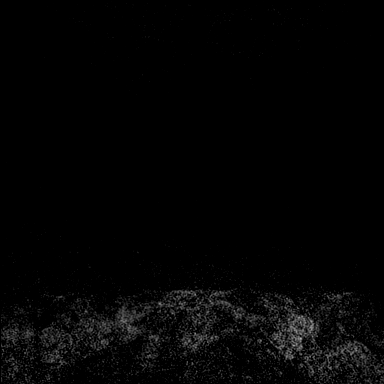

[Series 9: fl3d post-cm 3 · axial · 1.2mm · 0.89mm/px · z∈[-99,+38]mm · 5 of 144 slices shown (2 of 2)]
[im 1/144]
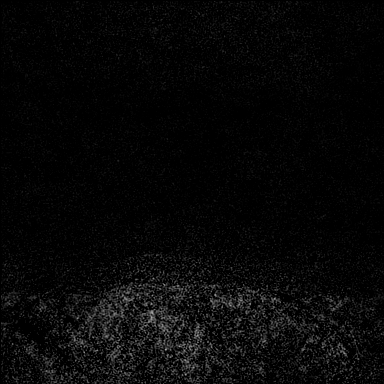
[im 29/144]
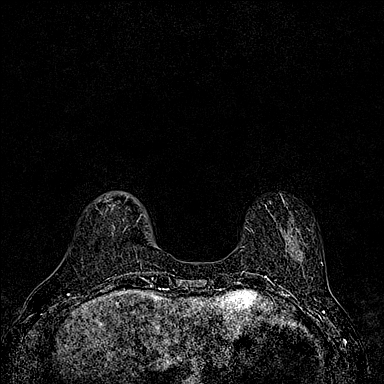
[im 58/144]
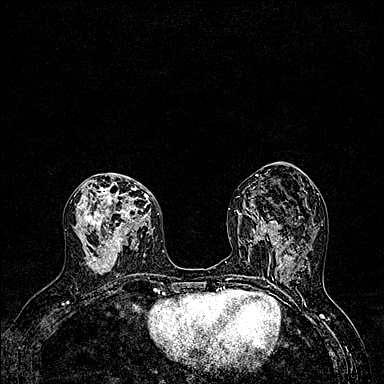
[im 86/144]
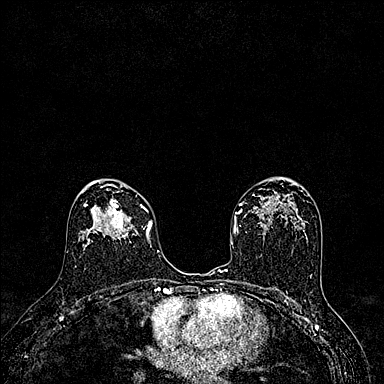
[im 115/144]
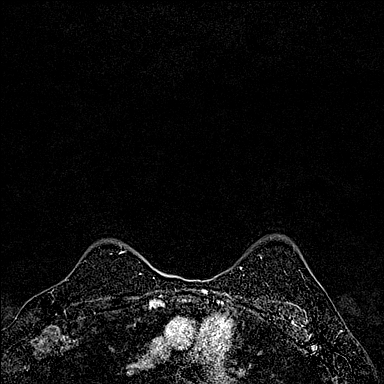

[32 of 48 positions shown; findings below may reference images not displayed]

Three-dimensional MR images were rendered by post-processing of the
original MR data on an independent workstation. The
three-dimensional MR images were interpreted, and findings are
reported in the following complete MRI report for this study. Three
dimensional images were evaluated at the independent interpreting
workstation using the DynaCAD thin client.
FINDINGS: Breast composition: d. Extreme fibroglandular tissue.

Background parenchymal enhancement: Moderate

Right breast: A 2 x 1.5 x 2 cm irregular rim enhancing UPPER INNER
RIGHT breast mass, anterior to middle depth, containing biopsy clip
artifact is compatible with known biopsy-proven malignancy (image
74: Series 6).

Suspicious patchy areas of non masslike enhancement throughout the
central RIGHT breast are noted spanning an area of at least 7 cm.

Left breast: No mass or abnormal enhancement.

Lymph nodes: A markedly enlarged RIGHT axillary lymph node is noted
compatible with biopsy-proven metastatic disease.

There are a few borderline sized RIGHT internal mammary lymph nodes
identified, the largest measuring 6 mm ([DATE]).

No other abnormal appearing lymph nodes are identified.

Ancillary findings:  None.
IMPRESSION: 1. 2 cm biopsy-proven malignancy within the UPPER INNER RIGHT breast
and biopsy-proven RIGHT axillary lymph node metastasis.
2. Suspicious patchy non masslike enhancement throughout the central
RIGHT breast spanning an area of at least 7 cm. If breast
conservation is desired, MR guided biopsy of 1 or 2 of these areas
is recommended.
3. Borderline sized RIGHT internal mammary lymph nodes, the largest
measuring 6 mm.
4. No MR evidence of LEFT breast malignancy.

RECOMMENDATION:
1. If breast conservation is desired, recommend tissue sampling of 1
or 2 areas of central RIGHT breast non masslike enhancement.
2. Otherwise treatment plan.

BI-RADS CATEGORY  4: Suspicious.

## 2021-11-16 MED ORDER — GADOBUTROL 1 MMOL/ML IV SOLN
7.0000 mL | Freq: Once | INTRAVENOUS | Status: AC | PRN
  Administered 2021-11-16: 7 mL via INTRAVENOUS

## 2021-11-17 ENCOUNTER — Inpatient Hospital Stay: Payer: Managed Care, Other (non HMO)

## 2021-11-17 ENCOUNTER — Encounter: Payer: Self-pay | Admitting: Hematology and Oncology

## 2021-11-17 DIAGNOSIS — C50411 Malignant neoplasm of upper-outer quadrant of right female breast: Secondary | ICD-10-CM

## 2021-11-17 NOTE — Progress Notes (Signed)
Met with patient at registration to introduce myself as Financial Resource Specialist and to offer available resources.  Discussed one-time $1000 [employer] grant and qualifications to assist with personal expenses while going through treatment.  Gave her my card if interested in applying and for any additional financial questions or concerns. 

## 2021-11-17 NOTE — Research (Signed)
[facility] [id number] Refractory Nausea Management:  Met briefly with Mrs Sipos and her husband to let them know that her prescribed chemo regimen did not meet parameters for study enrollment. They verbalized understanding and denied further questions.   Marjie Skiff Natali Lavallee, RN, BSN, [place] Direct Dial [phone]  Pager [phone] 11/17/2021 2:55 PM

## 2021-11-18 ENCOUNTER — Other Ambulatory Visit: Payer: Self-pay | Admitting: *Deleted

## 2021-11-18 ENCOUNTER — Telehealth: Payer: Self-pay | Admitting: *Deleted

## 2021-11-18 ENCOUNTER — Encounter: Payer: Self-pay | Admitting: *Deleted

## 2021-11-18 ENCOUNTER — Encounter (HOSPITAL_COMMUNITY): Payer: Self-pay

## 2021-11-18 ENCOUNTER — Ambulatory Visit (HOSPITAL_COMMUNITY)
Admission: RE | Admit: 2021-11-18 | Discharge: 2021-11-18 | Disposition: A | Discharge status: Home / Self Care | Payer: Managed Care, Other (non HMO) | Source: Ambulatory Visit | Attending: Hematology and Oncology | Admitting: Hematology and Oncology

## 2021-11-18 ENCOUNTER — Other Ambulatory Visit: Payer: Self-pay | Admitting: Hematology and Oncology

## 2021-11-18 ENCOUNTER — Other Ambulatory Visit: Payer: Self-pay

## 2021-11-18 DIAGNOSIS — Z171 Estrogen receptor negative status [ER-]: Secondary | ICD-10-CM | POA: Insufficient documentation

## 2021-11-18 DIAGNOSIS — C50411 Malignant neoplasm of upper-outer quadrant of right female breast: Secondary | ICD-10-CM | POA: Diagnosis present

## 2021-11-18 IMAGING — CT CT ABD-PELV W/ CM
2 of 4 series · 12 of 36 positions shown, 15 images · IV contrast (OMNIPAQUE)
Comparison: Comparison is made with [DATE].

CLINICAL DATA: A 40-year-old female presents for breast cancer
staging following diagnosis of RIGHT breast cancer.

EXAM:
CT CHEST, ABDOMEN, AND PELVIS WITH CONTRAST
TECHNIQUE: Multidetector CT imaging of the chest, abdomen and pelvis was
performed following the standard protocol during bolus
administration of intravenous contrast.
CONTRAST:  80mL OMNIPAQUE IOHEXOL 350 MG/ML SOLN

[Series 5: lung · axial · 0.78mm/px · z∈[+1164,+1730]mm · 9 of 337 slices shown, 12 images]
[im 36/337  mediastinal]
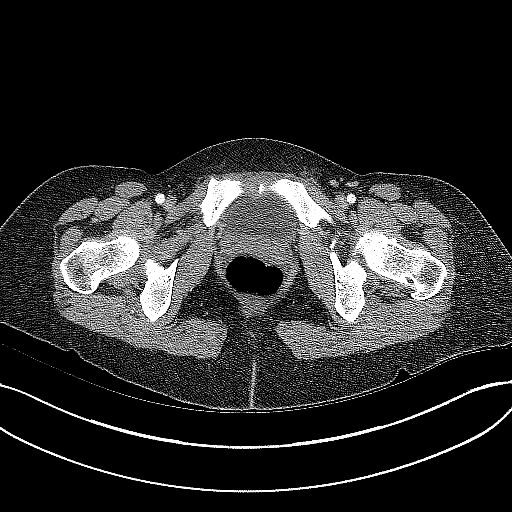
[im 36/337  lung]
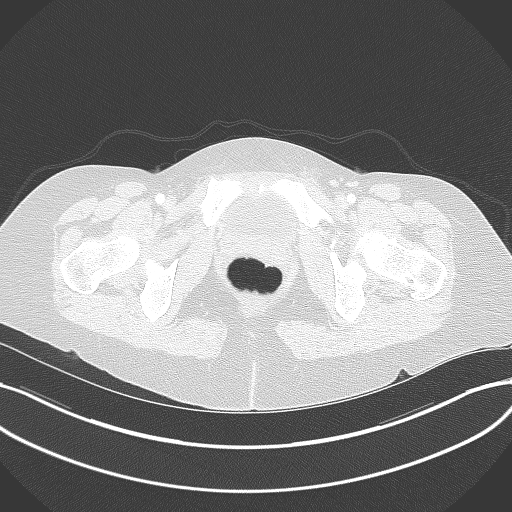
[im 71/337  lung]
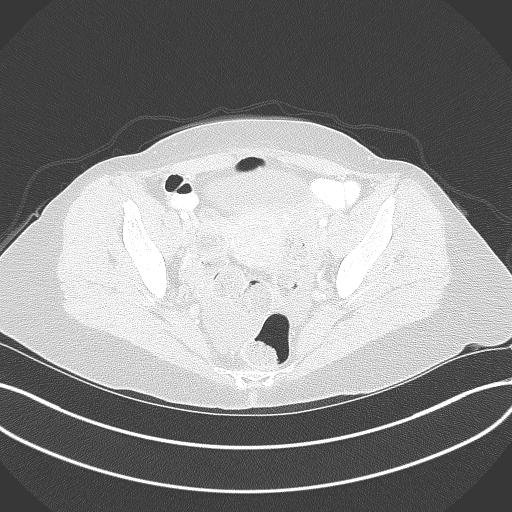
[im 107/337  lung]
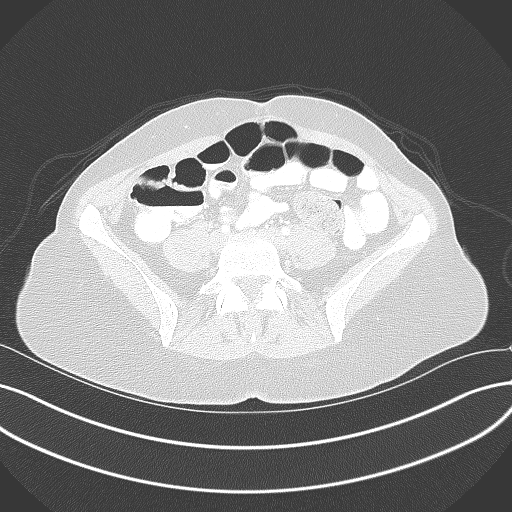
[im 142/337  lung]
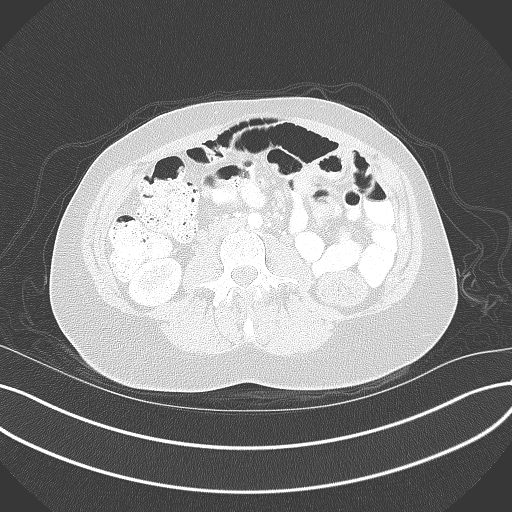
[im 177/337  mediastinal]
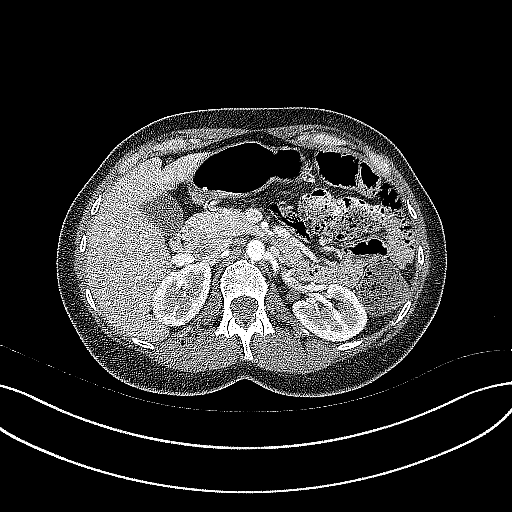
[im 177/337  lung]
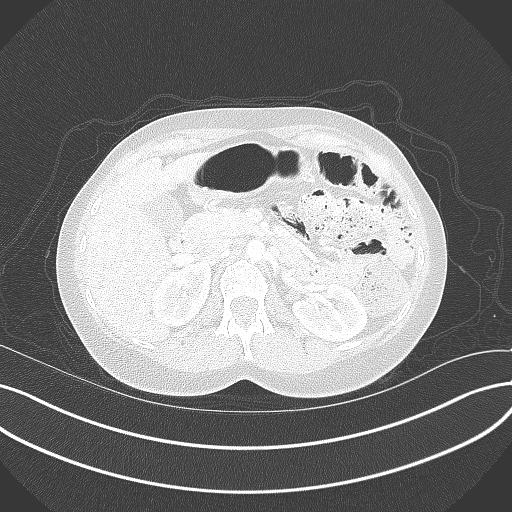
[im 213/337  lung]
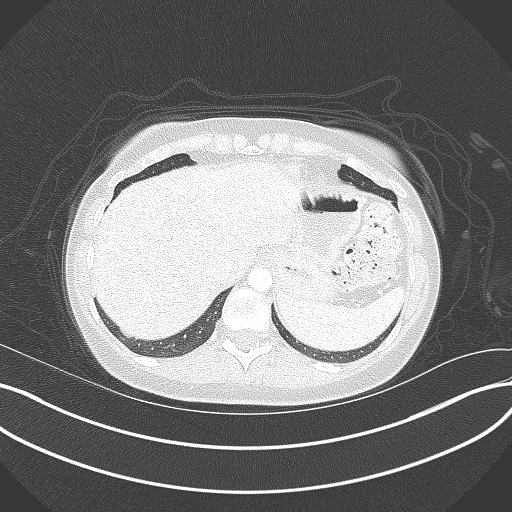
[im 248/337  lung]
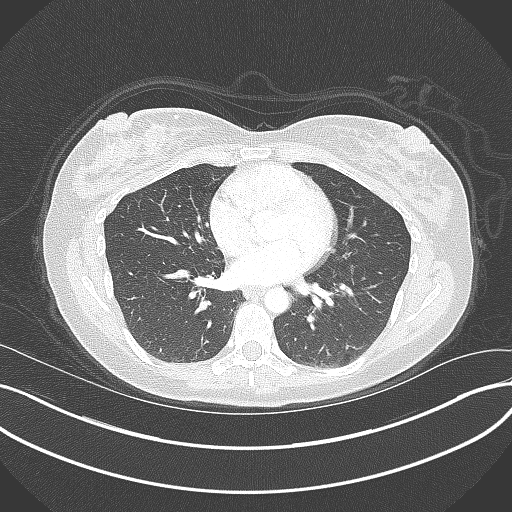
[im 283/337  lung]
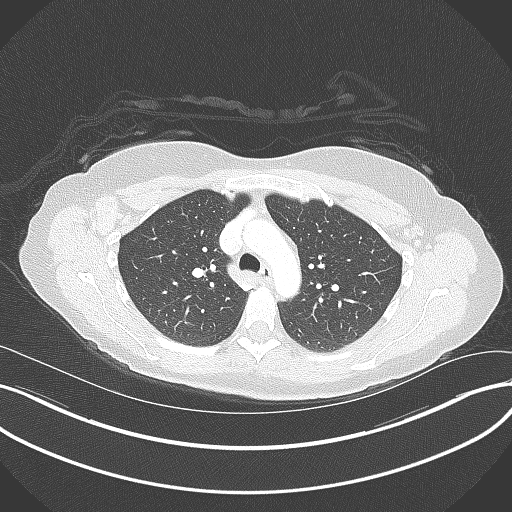
[im 319/337  mediastinal]
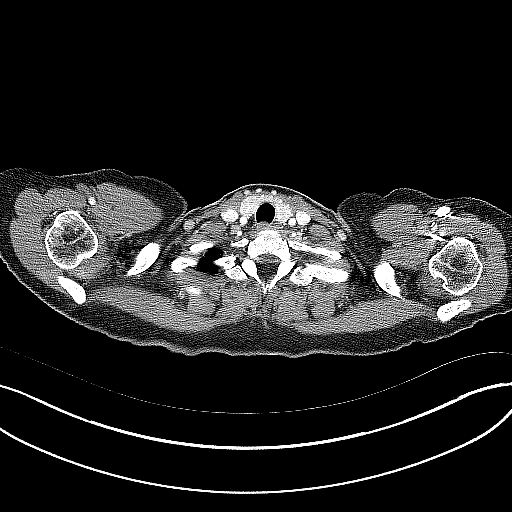
[im 319/337  lung]
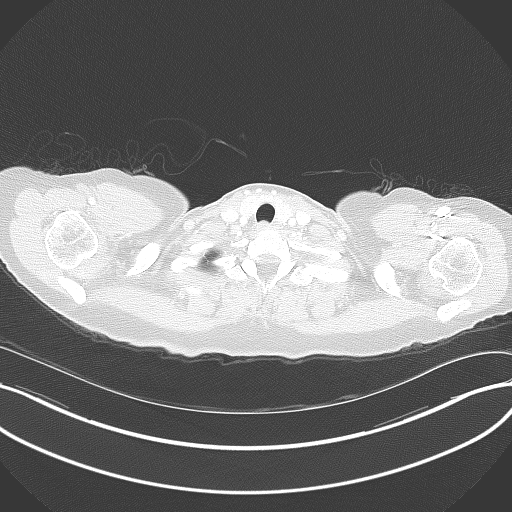

[Series 6: coronals · coronal · 0.75mm/px · 3 of 134 slices shown]
[im 27/134  lung]
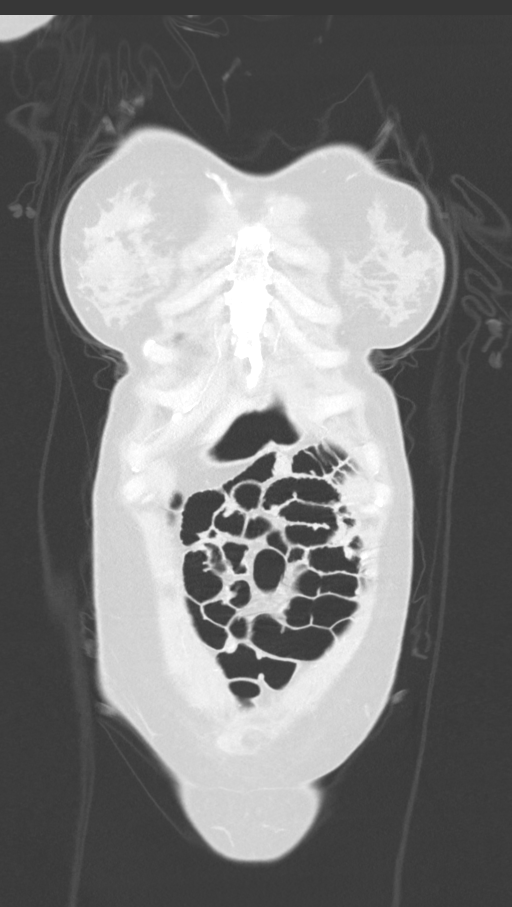
[im 54/134  lung]
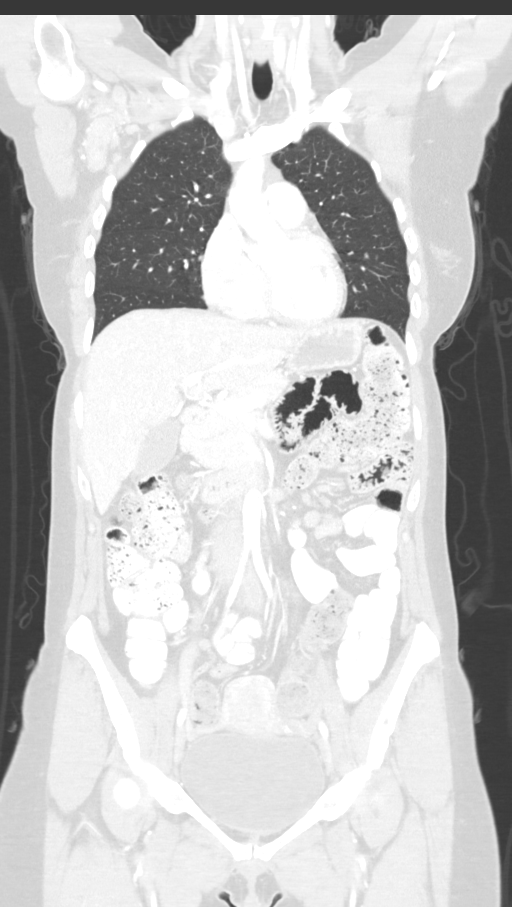
[im 80/134  lung]
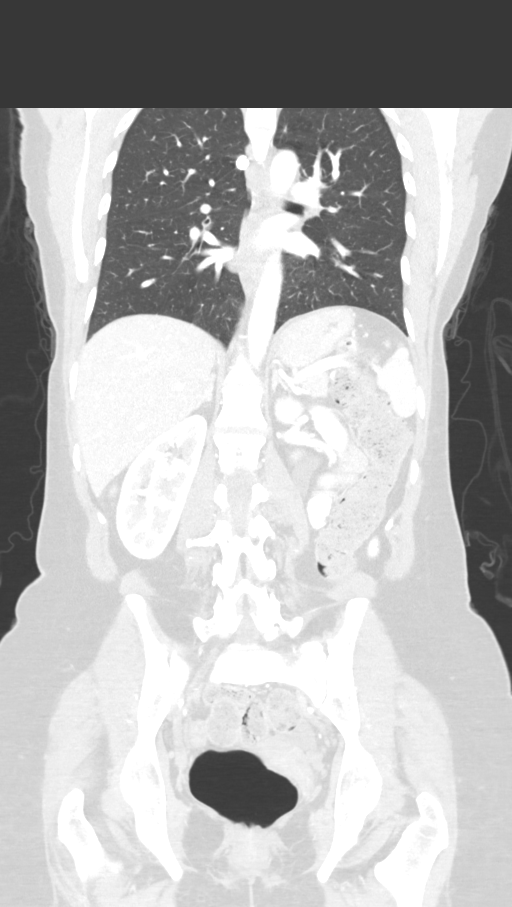

[12 of 36 positions shown; findings below may reference images not displayed]

FINDINGS: CT CHEST FINDINGS

Cardiovascular: Heart is normal heart size. Normal aortic caliber.
Normal contour of the thoracic aorta. Central pulmonary vasculature
is unremarkable on venous phase assessment.

Mediastinum/Nodes: Thoracic inlet structures are unremarkable. Small
subpectoral lymph nodes on the RIGHT largest 7 mm (image [DATE])
suspicious based on proximity to a bulky RIGHT axillary lymph node
that measures 2.7 cm short axis (image [DATE])

Irregular enhancement of the RIGHT breast along the upper inner
portion likely the site of reported breast neoplasm. No
supraclavicular adenopathy. Small RIGHT internal mammary lymph nodes
between 5 and 6 mm short axis along the high RIGHT internal mammary
chain. (Image [DATE]) (image [DATE]) similar size lymph nodes are not
seen on the contralateral internal mammary chain.

Lungs/Pleura: No effusion. No consolidative changes. Airways are
patent. No suspicious pulmonary nodule

Musculoskeletal: See below for full musculoskeletal detail.

CT ABDOMEN PELVIS FINDINGS

Hepatobiliary: No focal, suspicious hepatic lesion. No
pericholecystic stranding. No biliary duct dilation. Portal vein is
patent.

Pancreas: Normal, without mass, inflammation or ductal dilatation.

Spleen: Spleen normal size and contour.

Adrenals/Urinary Tract: Adrenal glands are normal.

Symmetric renal enhancement. Nephrolithiasis of the RIGHT kidney
similar to previous imaging, dominant calculi in the upper pole
largest approximately 7 mm.

Smooth contour of the urinary bladder.

Stomach/Bowel: Abundant stool throughout the colon. No acute
gastrointestinal process. Normal appendix.

Vascular/Lymphatic:

Aorta with smooth contours. IVC with smooth contours. No aneurysmal
dilation of the abdominal aorta. There is no gastrohepatic or
hepatoduodenal ligament lymphadenopathy. No retroperitoneal or
mesenteric lymphadenopathy.

No pelvic sidewall lymphadenopathy.

Reproductive: Unremarkable by CT.  No adnexal mass.

Other: Small volume ascites more likely physiologic.

Musculoskeletal: No acute or significant osseous findings.
IMPRESSION: Signs of RIGHT breast cancer with axillary nodal metastases.

Mildly enlarged RIGHT retropectoral lymph nodes also suspicious for
nodal disease.

RIGHT internal mammary lymph nodes while small raise the question of
nodal involvement in this area as well. PET scan may be helpful for
further evaluation as warranted.

No evidence of metastatic disease involving the abdomen or pelvis.

Small volume ascites more likely physiologic.

Nephrolithiasis of the RIGHT kidney similar to previous imaging,
dominant calculi in the upper pole largest approximately 7 mm.

## 2021-11-18 MED ORDER — DIATRIZOATE MEGLUMINE & SODIUM 66-10 % PO SOLN
30.0000 mL | Freq: Once | ORAL | Status: AC
  Administered 2021-11-18: 30 mL via ORAL

## 2021-11-18 MED ORDER — IOHEXOL 350 MG/ML SOLN
80.0000 mL | Freq: Once | INTRAVENOUS | Status: AC | PRN
  Administered 2021-11-18: 80 mL via INTRAVENOUS

## 2021-11-18 NOTE — Telephone Encounter (Signed)
Received call from patient asking to change her follow up with Dr. Lindi Adie from 12/1 to 11/29.  Appt changed to 11/29 at 1215

## 2021-11-18 NOTE — Telephone Encounter (Signed)
Received call that patient wanted to change her appts for scans and port.  She wants all her scans to be done prior to her port.  Per Nuclear med cannot change the bone scan to an earlier date due to radiotracer.  Dr. Donne Hazel could do port 11/30 1st thing prior to bone scan.  She would like to schedule it then for 11/30. Follow up made for 12/1 to discuss scans. Patient is aware she also needs add MRI bx for NME seen right breast. Orders have been placed and GI is working on getting her scheduled for that. She may end up wanting mastectomy with reconstruction.

## 2021-11-23 ENCOUNTER — Ambulatory Visit (HOSPITAL_COMMUNITY)
Admission: RE | Admit: 2021-11-23 | Discharge: 2021-11-23 | Disposition: A | Discharge status: Home / Self Care | Payer: Managed Care, Other (non HMO) | Source: Ambulatory Visit | Attending: Hematology and Oncology | Admitting: Hematology and Oncology

## 2021-11-23 ENCOUNTER — Other Ambulatory Visit: Payer: Self-pay

## 2021-11-23 DIAGNOSIS — C50411 Malignant neoplasm of upper-outer quadrant of right female breast: Secondary | ICD-10-CM | POA: Diagnosis not present

## 2021-11-23 DIAGNOSIS — Z171 Estrogen receptor negative status [ER-]: Secondary | ICD-10-CM | POA: Diagnosis not present

## 2021-11-23 DIAGNOSIS — Z0189 Encounter for other specified special examinations: Secondary | ICD-10-CM

## 2021-11-23 DIAGNOSIS — Z01818 Encounter for other preprocedural examination: Secondary | ICD-10-CM | POA: Diagnosis not present

## 2021-11-23 LAB — ECHOCARDIOGRAM COMPLETE
AR max vel: 2.13 cm2
AV Area VTI: 1.99 cm2
AV Area mean vel: 1.95 cm2
AV Mean grad: 3 mmHg
AV Peak grad: 5.3 mmHg
Ao pk vel: 1.15 m/s
Area-P 1/2: 3.83 cm2
Calc EF: 57.6 %
S' Lateral: 2.6 cm
Single Plane A2C EF: 61.5 %
Single Plane A4C EF: 54.4 %

## 2021-11-23 NOTE — Assessment & Plan Note (Signed)
11/04/2021: Patient has been breast-feeding.  Felt a painful right breast mass and an axillary mass.  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 with a Ki-67 of 40%, 1 abnormal lymph node 5.8 cm biopsy: Grade 3 IDC ER 5%, PR 0%, HER2 negative, Ki-67 40%   Treatment Plan: based on multidisciplinary tumor board: 1. Neoadjuvant chemotherapy with Taxol weekly 12 with Keytruda and carboplatin every 3 weeks x4 Adriamycin and Cytoxan, Keytruda dose dense 4 followed by  followed by Keytruda maintenance for 1 year 2. Followed by breast conserving surgery with targeted axillary dissection 3. Followed by adjuvant radiation therapy ---------------------------------------------------------------------------------------------------------------------------  Current Treatment: Cycle 1 Taxol carboplatin and Keytruda Labs reviewed, chemo education completed  RTC in 1 week for Tox check  

## 2021-11-23 NOTE — Progress Notes (Signed)
Patient Care Team: [name], MD as PCP - General (Family Medicine)  DIAGNOSIS:    ICD-10-CM   1. Malignant neoplasm of upper-outer quadrant of right breast in female, estrogen receptor negative ([place])  C50.411    Z17.1       SUMMARY OF ONCOLOGIC HISTORY: Oncology History  Malignant neoplasm of upper-outer quadrant of right breast in female, estrogen receptor negative ([place])  11/04/2021 Initial Diagnosis    Patient has been breast-feeding.  Felt a painful right breast mass and an axillary mass.  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 with a Ki-67 of 40%, 1 abnormal lymph node 5.8 cm biopsy: Grade 3 IDC ER 5%, PR 0%, HER2 negative, Ki-67 40%   11/11/2021 Cancer Staging   Staging form: Breast, AJCC 8th Edition - Clinical stage from 11/11/2021: Stage IIB (cT2, cN1, cM0, G3, ER+, PR+, HER2-) - Signed by Nicholas Lose, MD on 11/11/2021 Stage prefix: Initial diagnosis Histologic grading system: 3 grade system    11/23/2021 -  Chemotherapy   Patient is on Treatment Plan : BREAST Pembrolizumab + Carboplatin D1 + Paclitaxel D1,8,15 q21d X 4 cycles / Pembrolizumab + AC q21d x 4 cycles       CHIEF COMPLIANT: Follow-up of right breast cancer  INTERVAL HISTORY: Sydney Rios is a 40 y.o. with above-mentioned history of right breast cancer. MRI Breast 11/16/2021 showed 2 cm malignancy within UIQ right breast and right axillary lymph node metastasis, and 6 mm right internal mammary lymph nodes. CT CAP 11/18/2021 showed signs of right breast cancer with axillary nodal metastases. She presents to the clinic today for follow-up.   ALLERGIES:  has No Known Allergies.  MEDICATIONS:  Current Outpatient Medications  Medication Sig Dispense Refill   Ascorbic Acid (VITAMIN C) 1000 MG tablet Take 1,000 mg by mouth 2 (two) times daily as needed (immune support (if sick)).     Cholecalciferol (VITAMIN D3) 125 MCG (5000 UT) TABS Take 5,000 Units by  mouth daily as needed (immune support (if sick)).     IVERMECTIN PO Take 20 mg by mouth in the morning.     lidocaine-prilocaine (EMLA) cream Apply to affected area once 30 g 3   Menaquinone-7 (VITAMIN K2) 100 MCG CAPS Take 100 mcg by mouth daily as needed (immune support (if sick)).     NALTREXONE HCL PO Take 1 mg by mouth in the morning.     ondansetron (ZOFRAN) 8 MG tablet Take 1 tablet (8 mg total) by mouth 2 (two) times daily as needed. Start on the third day after carboplatin and AC chemotherapy. 30 tablet 1   prochlorperazine (COMPAZINE) 10 MG tablet Take 1 tablet (10 mg total) by mouth every 6 (six) hours as needed (Nausea or vomiting). 30 tablet 1   thyroid (ARMOUR) 30 MG tablet Take 30 mg by mouth daily before breakfast.     valACYclovir (VALTREX) 1000 MG tablet Take 2 g by mouth 2 (two) times daily as needed (cold sore/fever blisters).     Zinc 50 MG TABS Take 50 mg by mouth daily as needed.     No current facility-administered medications for this visit.    PHYSICAL EXAMINATION: ECOG PERFORMANCE STATUS: 1 - Symptomatic but completely ambulatory  Vitals:   11/24/21 1223  BP: (!) 128/50  Pulse: 76  Resp: 18  Temp: (!) 97.5 F (36.4 C)  SpO2: 100%   Filed Weights   11/24/21 1223  Weight: 146 lb (66.2  kg)     LABORATORY DATA:  I have reviewed the data as listed CMP Latest Ref Rng & Units 11/12/2021 10/22/2018 04/29/2010  Glucose 70 - 99 mg/dL 116(H) 90 86  BUN 6 - 20 mg/dL _0 Creatinine 0.44 - 1.00 mg/dL 0.79 0.66 0.46  Sodium 135 - 145 mmol/L 141 137 134(L)  Potassium 3.5 - 5.1 mmol/L 3.9 4.3 4.0  Chloride 98 - 111 mmol/L 105 109 104  CO2 22 - 32 mmol/L 25 19(L) 26  Calcium 8.9 - 10.3 mg/dL 9.9 8.6(L) 9.0  Total Protein 6.5 - 8.1 g/dL 7.3 5.5(L) 5.9(L)  Total Bilirubin 0.3 - 1.2 mg/dL 0.4 0.5 0.1(L)  Alkaline Phos 38 - 126 U/L 45 96 34(L)  AST 15 - 41 U/L 16 16 32  ALT 0 - 44 U/L 17 11 41(H)    Lab Results  Component Value Date   WBC 4.5 11/12/2021    HGB 13.5 11/12/2021   HCT 41.2 11/12/2021   MCV 90.7 11/12/2021   PLT 200 11/12/2021   NEUTROABS 3.3 11/12/2021    ASSESSMENT & PLAN:  Malignant neoplasm of upper-outer quadrant of right breast in female, estrogen receptor negative ([place]) 11/04/2021: Patient has been breast-feeding.  Felt a painful right breast mass and an axillary mass.  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 Neoadjuvant chemotherapy with Taxol weekly 12 with Keytruda and carboplatin every 3 weeks x4 Adriamycin and Cytoxan, Keytruda dose dense 4 followed by  followed by Beryle Flock maintenance for 1 year 2. Followed by mastectomy with targeted axillary dissection 3. Followed by adjuvant radiation therapy  ---------------------------------------------------------------------------------------------------------------------------  Current Treatment: Cycle 1 Taxol carboplatin and Keytruda to start 11/30/2021 Labs reviewed, chemo education completed  CT CAP: Right breast cancer with axillary lymph nodes, mildly enlarged right retropectoral lymph nodes, right internal mammary lymph nodes Breast MRI: Known breast cancer 2 cm, right axillary lymph node, non-mass enhancement 7 cm, borderline enlarged right internal mammary lymph nodes I discussed with her that there is no role of PET CT scan because lymph nodes are very small.  Chemotherapy and radiation will have to effectively cure these nodes.  RTC to see me on week 2 for toxicity evaluation    No orders of the defined types were placed in this encounter.  The patient has a good understanding of the overall plan. she agrees with it. she will call with any problems that may develop before the next visit here.  Total time spent: 30 mins including face to face time  and time spent for planning, charting and coordination of care  Rulon Eisenmenger, MD, MPH 11/24/2021  I, Thana Ates, am acting as scribe for Dr. Nicholas Lose.  I have reviewed the above documentation for accuracy and completeness, and I agree with the above.

## 2021-11-23 NOTE — Progress Notes (Signed)
Pharmacist Chemotherapy Monitoring - Initial Assessment    Anticipated start date: 11/30/21   The following has been reviewed per standard work regarding the patient's treatment regimen: The patient's diagnosis, treatment plan and drug doses, and organ/hematologic function Lab orders and baseline tests specific to treatment regimen  The treatment plan start date, drug sequencing, and pre-medications Prior authorization status  Patient's documented medication list, including drug-drug interaction screen and prescriptions for anti-emetics and supportive care specific to the treatment regimen The drug concentrations, fluid compatibility, administration routes, and timing of the medications to be used The patient's access for treatment and lifetime cumulative dose history, if applicable  The patient's medication allergies and previous infusion related reactions, if applicable   Changes made to treatment plan:  N/A  Follow up needed:  Pending authorization for treatment   Benn Moulder, PharmD Pharmacy Resident  11/23/2021 10:28 AM

## 2021-11-24 ENCOUNTER — Encounter: Payer: Self-pay | Admitting: *Deleted

## 2021-11-24 ENCOUNTER — Encounter (HOSPITAL_COMMUNITY): Payer: Self-pay | Admitting: General Surgery

## 2021-11-24 ENCOUNTER — Inpatient Hospital Stay: Payer: Managed Care, Other (non HMO) | Admitting: Hematology and Oncology

## 2021-11-24 DIAGNOSIS — Z171 Estrogen receptor negative status [ER-]: Secondary | ICD-10-CM | POA: Diagnosis not present

## 2021-11-24 DIAGNOSIS — C50411 Malignant neoplasm of upper-outer quadrant of right female breast: Secondary | ICD-10-CM

## 2021-11-24 DIAGNOSIS — C50211 Malignant neoplasm of upper-inner quadrant of right female breast: Secondary | ICD-10-CM | POA: Diagnosis not present

## 2021-11-24 NOTE — Progress Notes (Signed)
PCP - Dr. Darron Doom Cardiologist - denies EKG -  Chest x-ray -  ECHO - 11/23/21 Cardiac Cath -  CPAP -    ERAS Protcol - 0530  COVID TEST- n/a  Anesthesia review: n/a  -------------  SDW INSTRUCTIONS:  Your procedure is scheduled on Wednesday 11/30 Please report to [facility] Main Entrance "A" at 0530 A.M., and check in at the Admitting office. Call this number if you have problems the morning of surgery: [phone]   Remember: Do not eat after midnight the night before your surgery  You may drink clear liquids until 0430 AM the morning of your surgery.   Clear liquids allowed are: Water, Non-Citrus Juices (without pulp), Carbonated Beverages, Clear Tea, Black Coffee Only, and Gatorade   Medications to take morning of surgery with a sip of water include: Thyroid  As of today, STOP taking any Aspirin (unless otherwise instructed by your surgeon), Aleve, Naproxen, Ibuprofen, Motrin, Advil, Goody's, BC's, all herbal medications, fish oil, and all vitamins.    The Morning of Surgery Do not wear jewelry, make-up or nail polish. Do not wear lotions, powders, or perfumes, or deodorant  Do not bring valuables to the hospital. [facility] is not responsible for any belongings or valuables.  If you are a smoker, DO NOT Smoke 24 hours prior to surgery  If you wear a CPAP at night please bring your mask the morning of surgery   Remember that you must have someone to transport you home after your surgery, and remain with you for 24 hours if you are discharged the same day.  Please bring cases for contacts, glasses, hearing aids, dentures or bridgework because it cannot be worn into surgery.   Patients discharged the day of surgery will not be allowed to drive home.   Please shower the NIGHT BEFORE/MORNING OF SURGERY (use antibacterial soap like DIAL soap if possible). Wear comfortable clothes the morning of surgery. Oral Hygiene is also important to reduce your risk of infection.   Remember - BRUSH YOUR TEETH THE MORNING OF SURGERY WITH YOUR REGULAR TOOTHPASTE  Patient denies shortness of breath, fever, cough and chest pain.

## 2021-11-25 ENCOUNTER — Ambulatory Visit (HOSPITAL_COMMUNITY)
Admission: RE | Admit: 2021-11-25 | Discharge: 2021-11-25 | Disposition: A | Discharge status: Home / Self Care | Payer: Managed Care, Other (non HMO) | Source: Ambulatory Visit | Attending: Hematology and Oncology | Admitting: Hematology and Oncology

## 2021-11-25 ENCOUNTER — Ambulatory Visit (HOSPITAL_COMMUNITY): Payer: Managed Care, Other (non HMO) | Admitting: Anesthesiology

## 2021-11-25 ENCOUNTER — Encounter (HOSPITAL_COMMUNITY): Payer: Self-pay | Admitting: General Surgery

## 2021-11-25 ENCOUNTER — Ambulatory Visit (HOSPITAL_COMMUNITY): Payer: Managed Care, Other (non HMO)

## 2021-11-25 ENCOUNTER — Encounter (HOSPITAL_COMMUNITY)
Admission: RE | Disposition: A | Discharge status: Home / Self Care | Payer: Self-pay | Source: Ambulatory Visit | Attending: General Surgery

## 2021-11-25 ENCOUNTER — Ambulatory Visit (HOSPITAL_COMMUNITY)
Admission: RE | Admit: 2021-11-25 | Discharge: 2021-11-25 | Disposition: A | Discharge status: Home / Self Care | Payer: Managed Care, Other (non HMO) | Source: Ambulatory Visit | Attending: General Surgery | Admitting: General Surgery

## 2021-11-25 ENCOUNTER — Other Ambulatory Visit: Payer: Self-pay

## 2021-11-25 DIAGNOSIS — Z171 Estrogen receptor negative status [ER-]: Secondary | ICD-10-CM | POA: Insufficient documentation

## 2021-11-25 DIAGNOSIS — C50411 Malignant neoplasm of upper-outer quadrant of right female breast: Secondary | ICD-10-CM | POA: Insufficient documentation

## 2021-11-25 DIAGNOSIS — Z419 Encounter for procedure for purposes other than remedying health state, unspecified: Secondary | ICD-10-CM

## 2021-11-25 HISTORY — PX: PORTACATH PLACEMENT: SHX2246

## 2021-11-25 LAB — POCT PREGNANCY, URINE: Preg Test, Ur: NEGATIVE

## 2021-11-25 LAB — GLUCOSE, CAPILLARY: Glucose-Capillary: 109 mg/dL — ABNORMAL HIGH (ref 70–99)

## 2021-11-25 IMAGING — NM NM BONE WHOLE BODY
2 series · 2 of 2 positions shown · non-contrast
Comparison: CT CAP, [DATE].

CLINICAL DATA: Breast cancer.  Staging

EXAM:
NUCLEAR MEDICINE WHOLE BODY BONE SCAN
TECHNIQUE: Whole body anterior and posterior images were obtained approximately
3 hours after intravenous injection of radiopharmaceutical.
RADIOPHARMACEUTICALS:  20.7 mCi [86] MDP IV

[Series 1: whole body · 2.66mm/px · 1 of 1 slices shown (1 of 2)]
[im 1/1]
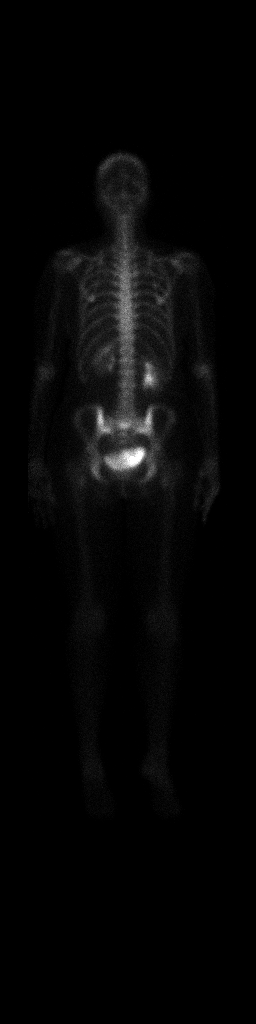

[Series 1: whole body · 2.66mm/px · 1 of 1 slices shown (2 of 2)]
[im 1/1]
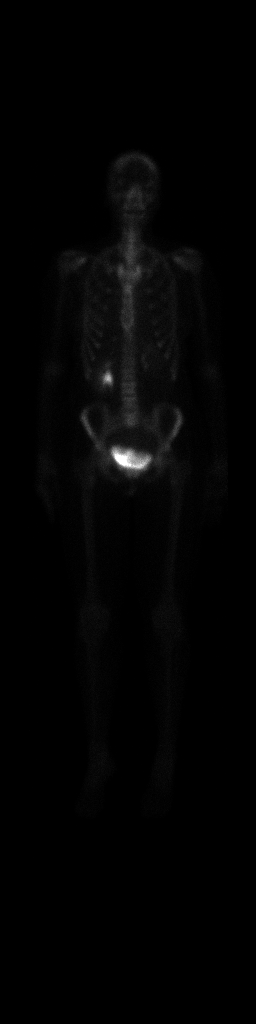

[2 of 2 positions shown; findings below may reference images not displayed]

FINDINGS: No evidence of focal radiotracer uptake within the axial or
appendicular skeleton.

Incidental contrast retention within the RIGHT renal renal pelvis
and proximal ureter.
IMPRESSION: 1. No osteoblastic metastases within the axial or appendicular
skeleton.
2. Incidental RIGHT pelviectasis versus mild hydronephrosis.

## 2021-11-25 IMAGING — RF DG CHEST 1V
1 series · 1 of 1 positions shown · non-contrast
Comparison: None.

CLINICAL DATA: Port-A-Cath placement.

EXAM:
CHEST  1 VIEW

[Series 1: unknown protocol · left · 0.20mm/px · 1 of 1 slices shown]
[im 1/1]
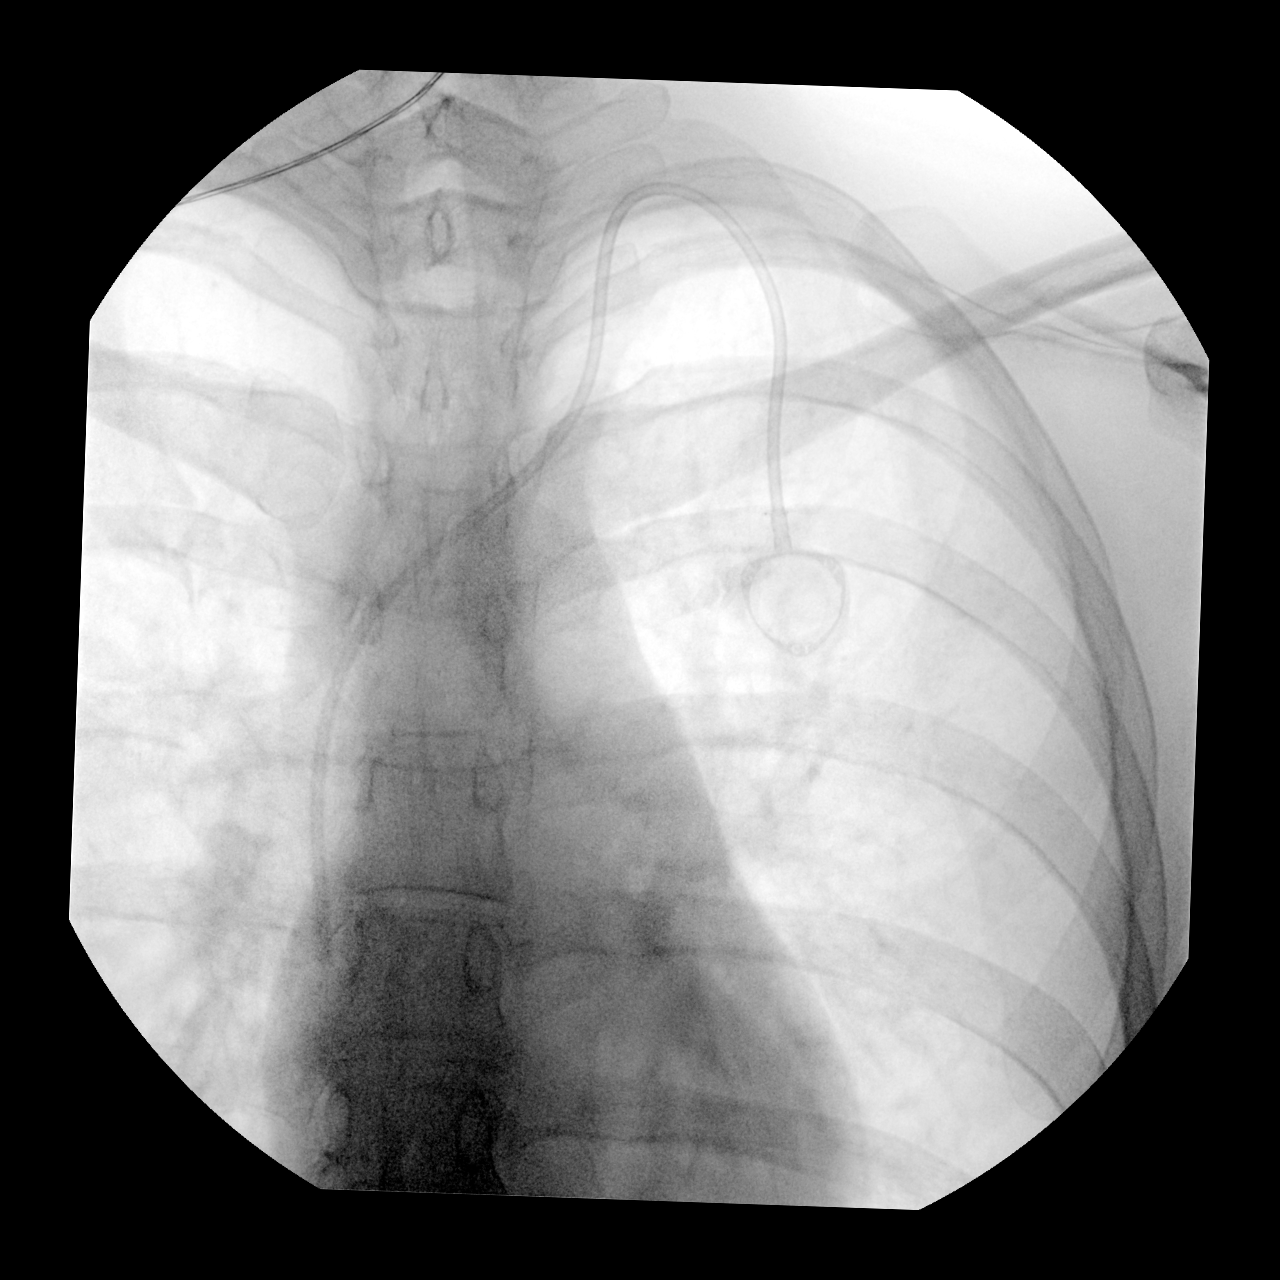

[1 of 1 positions shown; findings below may reference images not displayed]

FINDINGS: Left IJ access Port-A-Cath with distal tip in the SVC. Total
fluoroscopic time was 31 seconds and fluoroscopic dose was
mGy.
IMPRESSION: Left IJ access Port-A-Cath with distal tip in the SVC.

## 2021-11-25 SURGERY — INSERTION, TUNNELED CENTRAL VENOUS DEVICE, WITH PORT
Anesthesia: General | Site: Chest | Laterality: Left

## 2021-11-25 MED ORDER — LIDOCAINE 2% (20 MG/ML) 5 ML SYRINGE
INTRAMUSCULAR | Status: DC | PRN
  Administered 2021-11-25: 60 mg via INTRAVENOUS

## 2021-11-25 MED ORDER — CHLORHEXIDINE GLUCONATE CLOTH 2 % EX PADS: 6.0000 | MEDICATED_PAD | Freq: Once | CUTANEOUS | Status: DC

## 2021-11-25 MED ORDER — HEPARIN 6000 UNIT IRRIGATION SOLUTION
Status: DC | PRN
  Administered 2021-11-25: 1

## 2021-11-25 MED ORDER — PROPOFOL 500 MG/50ML IV EMUL
INTRAVENOUS | Status: DC | PRN
  Administered 2021-11-25: 100 ug/kg/min via INTRAVENOUS

## 2021-11-25 MED ORDER — CHLORHEXIDINE GLUCONATE 0.12 % MT SOLN
OROMUCOSAL | Status: AC
  Administered 2021-11-25: 15 mL
  Filled 2021-11-25: qty 15

## 2021-11-25 MED ORDER — SODIUM CHLORIDE 0.9% FLUSH: 3.0000 mL | Freq: Two times a day (BID) | INTRAVENOUS | Status: DC

## 2021-11-25 MED ORDER — PROPOFOL 10 MG/ML IV BOLUS
INTRAVENOUS | Status: DC | PRN
  Administered 2021-11-25: 200 mg via INTRAVENOUS

## 2021-11-25 MED ORDER — ACETAMINOPHEN 650 MG RE SUPP: 650.0000 mg | RECTAL | Status: DC | PRN

## 2021-11-25 MED ORDER — TRAMADOL HCL 50 MG PO TABS: 100.0000 mg | ORAL_TABLET | Freq: Four times a day (QID) | ORAL | 0 refills | Status: DC | PRN

## 2021-11-25 MED ORDER — BUPIVACAINE-EPINEPHRINE (PF) 0.25% -1:200000 IJ SOLN
INTRAMUSCULAR | Status: AC
  Filled 2021-11-25: qty 30

## 2021-11-25 MED ORDER — SCOPOLAMINE 1 MG/3DAYS TD PT72
1.0000 | MEDICATED_PATCH | TRANSDERMAL | Status: DC
  Administered 2021-11-25: 1.5 mg via TRANSDERMAL
  Filled 2021-11-25: qty 1

## 2021-11-25 MED ORDER — BUPIVACAINE-EPINEPHRINE (PF) 0.25% -1:200000 IJ SOLN
INTRAMUSCULAR | Status: DC | PRN
  Administered 2021-11-25: 6 mL via PERINEURAL

## 2021-11-25 MED ORDER — LACTATED RINGERS IV SOLN: INTRAVENOUS | Status: DC

## 2021-11-25 MED ORDER — FENTANYL CITRATE (PF) 250 MCG/5ML IJ SOLN
INTRAMUSCULAR | Status: AC
  Filled 2021-11-25: qty 5

## 2021-11-25 MED ORDER — ACETAMINOPHEN 500 MG PO TABS
ORAL_TABLET | ORAL | Status: AC
  Administered 2021-11-25: 1000 mg via ORAL
  Filled 2021-11-25: qty 2

## 2021-11-25 MED ORDER — FENTANYL CITRATE (PF) 250 MCG/5ML IJ SOLN
INTRAMUSCULAR | Status: DC | PRN
  Administered 2021-11-25: 50 ug via INTRAVENOUS
  Administered 2021-11-25: 25 ug via INTRAVENOUS

## 2021-11-25 MED ORDER — CEFAZOLIN SODIUM-DEXTROSE 2-4 GM/100ML-% IV SOLN
INTRAVENOUS | Status: AC
  Filled 2021-11-25: qty 100

## 2021-11-25 MED ORDER — HEPARIN SOD (PORK) LOCK FLUSH 100 UNIT/ML IV SOLN
INTRAVENOUS | Status: AC
  Filled 2021-11-25: qty 5

## 2021-11-25 MED ORDER — ONDANSETRON HCL 4 MG/2ML IJ SOLN
INTRAMUSCULAR | Status: DC | PRN
  Administered 2021-11-25: 4 mg via INTRAVENOUS

## 2021-11-25 MED ORDER — SODIUM CHLORIDE 0.9 % IV SOLN: 250.0000 mL | INTRAVENOUS | Status: DC | PRN

## 2021-11-25 MED ORDER — PROPOFOL 10 MG/ML IV BOLUS
INTRAVENOUS | Status: AC
  Filled 2021-11-25: qty 20

## 2021-11-25 MED ORDER — ACETAMINOPHEN 500 MG PO TABS: 1000.0000 mg | ORAL_TABLET | ORAL | Status: AC

## 2021-11-25 MED ORDER — FENTANYL CITRATE (PF) 100 MCG/2ML IJ SOLN: 25.0000 ug | INTRAMUSCULAR | Status: DC | PRN

## 2021-11-25 MED ORDER — ENSURE PRE-SURGERY PO LIQD: 296.0000 mL | Freq: Once | ORAL | Status: DC

## 2021-11-25 MED ORDER — OXYCODONE HCL 5 MG PO TABS
5.0000 mg | ORAL_TABLET | ORAL | Status: DC | PRN
  Administered 2021-11-25: 5 mg via ORAL

## 2021-11-25 MED ORDER — TECHNETIUM TC 99M MEDRONATE IV KIT
20.7000 | PACK | Freq: Once | INTRAVENOUS | Status: AC
  Administered 2021-11-25: 20.7 via INTRAVENOUS

## 2021-11-25 MED ORDER — AMISULPRIDE (ANTIEMETIC) 5 MG/2ML IV SOLN: 10.0000 mg | Freq: Once | INTRAVENOUS | Status: DC | PRN

## 2021-11-25 MED ORDER — CEFAZOLIN SODIUM-DEXTROSE 2-4 GM/100ML-% IV SOLN
2.0000 g | INTRAVENOUS | Status: AC
  Administered 2021-11-25: 2 g via INTRAVENOUS

## 2021-11-25 MED ORDER — OXYCODONE HCL 5 MG PO TABS
ORAL_TABLET | ORAL | Status: AC
  Filled 2021-11-25: qty 1

## 2021-11-25 MED ORDER — MIDAZOLAM HCL 2 MG/2ML IJ SOLN
INTRAMUSCULAR | Status: AC
  Filled 2021-11-25: qty 2

## 2021-11-25 MED ORDER — HEPARIN SOD (PORK) LOCK FLUSH 100 UNIT/ML IV SOLN
INTRAVENOUS | Status: DC | PRN
  Administered 2021-11-25: 500 [IU]

## 2021-11-25 MED ORDER — MIDAZOLAM HCL 2 MG/2ML IJ SOLN
INTRAMUSCULAR | Status: DC | PRN
  Administered 2021-11-25: 2 mg via INTRAVENOUS

## 2021-11-25 MED ORDER — ACETAMINOPHEN 325 MG PO TABS: 650.0000 mg | ORAL_TABLET | ORAL | Status: DC | PRN

## 2021-11-25 MED ORDER — SODIUM CHLORIDE 0.9% FLUSH: 3.0000 mL | INTRAVENOUS | Status: DC | PRN

## 2021-11-25 MED ORDER — HEPARIN 6000 UNIT IRRIGATION SOLUTION
Status: AC
  Filled 2021-11-25: qty 500

## 2021-11-25 SURGICAL SUPPLY — 50 items
BAG DECANTER FOR FLEXI CONT (MISCELLANEOUS) ×2 IMPLANT
BENZOIN TINCTURE PRP APPL 2/3 (GAUZE/BANDAGES/DRESSINGS) ×2 IMPLANT
BLADE SURG 11 STRL SS (BLADE) ×2 IMPLANT
BLADE SURG 15 STRL LF DISP TIS (BLADE) ×1 IMPLANT
BLADE SURG 15 STRL SS (BLADE) ×1
CANISTER SUCT 1200ML W/VALVE (MISCELLANEOUS) IMPLANT
CHLORAPREP W/TINT 26 (MISCELLANEOUS) ×2 IMPLANT
COVER BACK TABLE 60X90IN (DRAPES) ×2 IMPLANT
COVER MAYO STAND STRL (DRAPES) ×2 IMPLANT
COVER PROBE 5X48 (MISCELLANEOUS)
COVER PROBE W GEL 5X96 (DRAPES) ×2 IMPLANT
COVER TRANSDUCER ULTRASND GEL (DISPOSABLE) ×2 IMPLANT
DECANTER SPIKE VIAL GLASS SM (MISCELLANEOUS) IMPLANT
DERMABOND ADVANCED (GAUZE/BANDAGES/DRESSINGS) ×1
DERMABOND ADVANCED .7 DNX12 (GAUZE/BANDAGES/DRESSINGS) ×1 IMPLANT
DRAPE C-ARM 42X72 X-RAY (DRAPES) ×2 IMPLANT
DRAPE LAPAROSCOPIC ABDOMINAL (DRAPES) ×2 IMPLANT
DRAPE UTILITY XL STRL (DRAPES) ×2 IMPLANT
DRSG TEGADERM 4X4.75 (GAUZE/BANDAGES/DRESSINGS) IMPLANT
ELECT COATED BLADE 2.86 ST (ELECTRODE) ×2 IMPLANT
ELECT REM PT RETURN 9FT ADLT (ELECTROSURGICAL) ×2
ELECTRODE REM PT RTRN 9FT ADLT (ELECTROSURGICAL) ×1 IMPLANT
GAUZE 4X4 16PLY ~~LOC~~+RFID DBL (SPONGE) ×2 IMPLANT
GAUZE SPONGE 4X4 12PLY STRL LF (GAUZE/BANDAGES/DRESSINGS) ×2 IMPLANT
GLOVE SURG ENC MOIS LTX SZ7 (GLOVE) ×2 IMPLANT
GLOVE SURG UNDER POLY LF SZ7.5 (GLOVE) ×2 IMPLANT
GOWN STRL REUS W/ TWL LRG LVL3 (GOWN DISPOSABLE) ×2 IMPLANT
GOWN STRL REUS W/TWL LRG LVL3 (GOWN DISPOSABLE) ×4
IV KIT MINILOC 20X1 SAFETY (NEEDLE) IMPLANT
KIT CVR 48X5XPRB PLUP LF (MISCELLANEOUS) IMPLANT
KIT PORT POWER 8FR ISP CVUE (Port) ×2 IMPLANT
NDL SAFETY ECLIPSE 18X1.5 (NEEDLE) IMPLANT
NEEDLE HYPO 18GX1.5 SHARP (NEEDLE)
NEEDLE HYPO 25X1 1.5 SAFETY (NEEDLE) ×2 IMPLANT
PACK BASIN DAY SURGERY FS (CUSTOM PROCEDURE TRAY) ×2 IMPLANT
PENCIL SMOKE EVACUATOR (MISCELLANEOUS) ×2 IMPLANT
SLEEVE SCD COMPRESS KNEE MED (STOCKING) ×2 IMPLANT
SPONGE T-LAP 18X18 ~~LOC~~+RFID (SPONGE) ×2 IMPLANT
STRIP CLOSURE SKIN 1/2X4 (GAUZE/BANDAGES/DRESSINGS) ×2 IMPLANT
SUT MNCRL AB 4-0 PS2 18 (SUTURE) ×2 IMPLANT
SUT PROLENE 2 0 SH DA (SUTURE) ×2 IMPLANT
SUT SILK 2 0 TIES 17X18 (SUTURE)
SUT SILK 2-0 18XBRD TIE BLK (SUTURE) IMPLANT
SUT VIC AB 3-0 SH 27 (SUTURE) ×1
SUT VIC AB 3-0 SH 27X BRD (SUTURE) ×1 IMPLANT
SYR 5ML LUER SLIP (SYRINGE) ×2 IMPLANT
SYR CONTROL 10ML LL (SYRINGE) ×2 IMPLANT
TOWEL GREEN STERILE FF (TOWEL DISPOSABLE) ×2 IMPLANT
TUBE CONNECTING 20X1/4 (TUBING) IMPLANT
YANKAUER SUCT BULB TIP NO VENT (SUCTIONS) IMPLANT

## 2021-11-25 NOTE — Discharge Instructions (Signed)
PORT-A-CATH: POST OP INSTRUCTIONS  Always review your discharge instruction sheet given to you by the facility where your surgery was performed.   A prescription for pain medication may be given to you upon discharge. Take your pain medication as prescribed, if needed. If narcotic pain medicine is not needed, then you make take acetaminophen (Tylenol) or ibuprofen (Advil) as needed.  Take your usually prescribed medications unless otherwise directed. If you need a refill on your pain medication, please contact our office. All narcotic pain medicine now requires a paper prescription.  Phoned in and fax refills are no longer allowed by law.  Prescriptions will not be filled after 5 pm or on weekends.  You should follow a light diet for the remainder of the day after your procedure. Most patients will experience some mild swelling and/or bruising in the area of the incision. It may take several days to resolve. It is common to experience some constipation if taking pain medication after surgery. Increasing fluid intake and taking a stool softener (such as Colace) will usually help or prevent this problem from occurring. A mild laxative (Milk of Magnesia or Miralax) should be taken according to package directions if there are no bowel movements after 48 hours.  Unless discharge instructions indicate otherwise, you may remove your bandages 48 hours after surgery, and you may shower at that time. You may have steri-strips (small white skin tapes) in place directly over the incision.  These strips should be left on the skin for 7-10 days.  If your surgeon used Dermabond (skin glue) on the incision, you may shower in 24 hours.  The glue will flake off over the next 2-3 weeks.  If your port is left accessed at the end of surgery (needle left in port), the dressing cannot get wet and should only by changed by a healthcare professional. When the port is no longer accessed (when the needle has been removed), follow  step 7.   ACTIVITIES:  Limit activity involving your arms for the next 72 hours. Do no strenuous exercise or activity for 1 week. You may drive when you are no longer taking prescription pain medication, you can comfortably wear a seatbelt, and you can maneuver your car. 10.You may need to see your doctor in the office for a follow-up appointment.  Please       check with your doctor.  11.When you receive a new Port-a-Cath, you will get a product guide and        ID card.  Please keep them in case you need them.  WHEN TO CALL YOUR DOCTOR ([phone]): Fever over 101.0 Chills Continued bleeding from incision Increased redness and tenderness at the site Shortness of breath, difficulty breathing   The clinic staff is available to answer your questions during regular business hours. Please don't hesitate to call and ask to speak to one of the nurses or medical assistants for clinical concerns. If you have a medical emergency, go to the nearest emergency room or call 911.  A surgeon from [facility] is always on call at the hospital.     For further information, please visit [email or web address]      

## 2021-11-25 NOTE — Op Note (Signed)
Preoperative diagnosis: Clinical stage II right breast cancer Postoperative diagnosis: Same as above Procedure: Ultrasound-guided left internal jugular port placement Surgeon: Dr. Serita Grammes Anesthesia: General Estimated blood loss: Minimal Complications: None Specimens: None Special count was correct completion Disposition to recovery stable condition  Indications: This is a 40 year old female who noted a right breast mass as well as an axillary mass.  She underwent ultrasound and showed a 2.6 cm breast mass as well as a 5.8 cm abnormal node.  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  We in a multidisciplinary fashion elected to proceed with primary chemotherapy.  I discussed port placement with her.  Procedure: After informed consent was obtained the patient was taken the operating.  She was given antibiotics.  SCDs were in place.  She was placed under general anesthesia without complication.  She was positioned in her arms tucked and appropriately padded.  She was prepped and draped in the standard sterile surgical fashion.  A surgical timeout was then performed.  I used the ultrasound to identify the left internal jugular vein.  I accessed this on the first pass.  I was able to place the wire.  Initially the wire went into the right subclavian.  I was able using fluoroscopy to pass this down into the vena cava.  Once I done this I infiltrated Marcaine and made an incision.  I developed a pocket below the clavicle.  I then tunneled between the sites then pulled the line through this.  I then placed the peel-away sheath over the wire.  I did this with fluoroscopy.  I then remove the wire assembly.  I then passed the line through the sheath.  I remove the peel-away sheath.  I pulled the line back to be in the distal cava.  I then connected this to the port.  I sutured this into place with 2-0 Prolene suture.  I then closed this with 3-0 Vicryl and  4-0 Monocryl.  Final fluoroscopic image showed the port to be in good position.  This flushed easily and aspirated blood.  I packed this with heparin.  I then placed Dermabond and glue over the incisions.  She tolerated this well and was transferred to recovery.

## 2021-11-25 NOTE — H&P (Signed)
40 year old female who I know from a diastases recti and a small hernia. She is otherwise fairly healthy. She has 4 children all under the age of 25. She noticed before the summer a breast mass she thought was related to mastitis given that she is still breast-feeding. She then noted an axillary mass and had this evaluated. She underwent mammogram and ultrasound. She has D density breast. There is a breast mass as well as an axillary mass ultrasound shows a 2.6 x 1.3 x 1.9 cm breast mass. This also showed a single abnormal lymph node in the right axilla measuring 5.8 x 2.6 x 3.3 cm. Really no other lymph nodes were able to be visualized due to the size. She underwent biopsy with clip placement of both of these. The node is positive. 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  Review of Systems: A complete review of systems was obtained from the patient. I have reviewed this information and discussed as appropriate with the patient. See HPI as well for other ROS.  Review of Systems  All other systems reviewed and are negative.  Medical History: Past Medical History:  Diagnosis Date   Anemia   Anxiety   Chronic kidney disease   Thyroid disease   Patient Active Problem List  Diagnosis   Atypical squamous cells of undetermined significance on cytologic smear of cervix (ASC-US)   Diastasis of muscle   Hashimoto's thyroiditis   Malignant neoplasm of upper-outer quadrant of right breast in female, estrogen receptor negative (CMS-HCC)   Past Surgical History:  Procedure Laterality Date   CESAREAN SECTION N/A   No Known Allergies  Current Outpatient Medications on File Prior to Visit  Medication Sig Dispense Refill   ascorbic acid, vitamin C, (VITAMIN C) 1000 MG tablet Take 1 tablet (1,000 mg total) by mouth once daily   cholecalciferol (VITAMIN D3) 1000 unit capsule Take 1 capsule (1,000 Units total) by mouth once daily   multivitamin tablet  Take 1 tablet by mouth once daily   valACYclovir (VALTREX) 1000 MG tablet valacyclovir 1 gram tablet   griseofulvin microsize (GRIFULVIN V) 500 mg tablet griseofulvin microsize 500 mg tablet   NP THYROID 30 mg tablet TAKE 1 TABLET BY MOUTH IN THE MORNING AND 20 TO 30 MINUTES BEFORE BREAKFAST (Patient not taking: Reported on 11/13/2021)   NP THYROID 60 mg tablet TAKE 1 TABLET BY MOUTH IN THE MORNING AND 20 TO 30 MINUTES BEFORE BREAKFAST (Patient not taking: Reported on 11/13/2021)   thyroid (ARMOUR) 30 mg tablet NP Thyroid 30 mg tablet TAKE 1 TABLET BY MOUTH IN THE MORNING AND 20 TO 30 MINUTES BEFORE BREAKFAST (Patient not taking: Reported on 11/13/2021)    Family History  Problem Relation Age of Onset   Hyperlipidemia (Elevated cholesterol) Mother   Diabetes Mother   High blood pressure (Hypertension) Father   Hyperlipidemia (Elevated cholesterol) Father    Social History   Tobacco Use  Smoking Status Never  Smokeless Tobacco Never    Social History   Socioeconomic History   Marital status: Unknown  Tobacco Use   Smoking status: Never   Smokeless tobacco: Never  Vaping Use   Vaping Use: Never used  Substance and Sexual Activity   Alcohol use: Never   Drug use: Never   Objective:  Vitals:  11/13/21 0812  BP: 120/74  Pulse: (!) 143  Weight: 68.2 kg (150 lb 6.4 oz)  Height: 170.2 cm (5\' 7" )  Body mass index is 23.56 kg/m.  Physical Exam Constitutional:  Appearance: Normal appearance.  Chest:  Breasts: Right: Mass present. No inverted nipple or nipple discharge.  Left: No inverted nipple, mass or nipple discharge.  Lymphadenopathy:  Upper Body:  Right upper body: Axillary adenopathy present. No supraclavicular adenopathy.  Left upper body: No supraclavicular or axillary adenopathy.  Neurological:  Mental Status: She is alert.     Assessment and Plan:   Malignant neoplasm of upper-outer quadrant of right breast in female, estrogen receptor negative  (CMS-HCC)  Port placement, genetics, MRI, primary chemotherapy  We discussed the staging and pathophysiology of breast cancer. We discussed all of the different options for treatment for breast cancer including surgery, chemotherapy, radiation therapy,  She has node positive triple negative disease. We discussed the indications for primary chemotherapy. I discussed port placement with her which I will plan to do as soon as possible. We discussed imaging now as well as at the end of chemotherapy. This in combination with genetic testing we will decide how we will proceed with surgery. She understands that she will need nodal surgery as well as at least a lumpectomy for the breast cancer. We are going to touch base with radiology about the MRI given the fact she is she is breast-feeding right now. She is already done her sozo measurements. She has genetic testing pending. I discussed this plan with she and her husband today.

## 2021-11-25 NOTE — Interval H&P Note (Signed)
History and Physical Interval Note:  11/25/2021 7:10 AM  Sydney Rios  has presented today for surgery, with the diagnosis of BREAST CANCER.  The various methods of treatment have been discussed with the patient and family. After consideration of risks, benefits and other options for treatment, the patient has consented to  Procedure(s): PORT PLACEMENT (N/A) as a surgical intervention.  The patient's history has been reviewed, patient examined, no change in status, stable for surgery.  I have reviewed the patient's chart and labs.  Questions were answered to the patient's satisfaction.     Rolm Bookbinder

## 2021-11-25 NOTE — Anesthesia Preprocedure Evaluation (Addendum)
Anesthesia Evaluation  Patient identified by MRN, date of birth, ID band Patient awake    Reviewed: Allergy & Precautions, NPO status , Patient's Chart, lab work & pertinent test results  History of Anesthesia Complications (+) PONV  Airway Mallampati: II  TM Distance: >3 FB Neck ROM: Full    Dental  (+) Dental Advisory Given   Pulmonary neg pulmonary ROS,    breath sounds clear to auscultation       Cardiovascular negative cardio ROS   Rhythm:Regular Rate:Normal     Neuro/Psych negative neurological ROS     GI/Hepatic negative GI ROS, Neg liver ROS,   Endo/Other  negative endocrine ROS  Renal/GU negative Renal ROS     Musculoskeletal   Abdominal   Peds  Hematology negative hematology ROS (+)   Anesthesia Other Findings   Reproductive/Obstetrics                             Anesthesia Physical Anesthesia Plan  ASA: 2  Anesthesia Plan: General   Post-op Pain Management: Minimal or no pain anticipated, Tylenol PO (pre-op) and Toradol IV (intra-op)   Induction: Intravenous  PONV Risk Score and Plan: Dexamethasone, Ondansetron, Midazolam, Propofol infusion, Treatment may vary due to age or medical condition, TIVA and Scopolamine patch - Pre-op  Airway Management Planned: LMA  Additional Equipment: None  Intra-op Plan:   Post-operative Plan: Extubation in OR  Informed Consent: I have reviewed the patients History and Physical, chart, labs and discussed the procedure including the risks, benefits and alternatives for the proposed anesthesia with the patient or authorized representative who has indicated his/her understanding and acceptance.     Dental advisory given  Plan Discussed with: CRNA  Anesthesia Plan Comments:        Anesthesia Quick Evaluation

## 2021-11-25 NOTE — Anesthesia Procedure Notes (Signed)
Procedure Name: LMA Insertion Date/Time: 11/25/2021 7:40 AM Performed by: Trinna Post., CRNA Pre-anesthesia Checklist: Patient identified, Emergency Drugs available, Suction available, Patient being monitored and Timeout performed Patient Re-evaluated:Patient Re-evaluated prior to induction Oxygen Delivery Method: Circle system utilized Preoxygenation: Pre-oxygenation with 100% oxygen Induction Type: IV induction LMA: LMA inserted LMA Size: 4.0 Number of attempts: 1 Placement Confirmation: positive ETCO2 and breath sounds checked- equal and bilateral Tube secured with: Tape Dental Injury: Teeth and Oropharynx as per pre-operative assessment

## 2021-11-25 NOTE — Transfer of Care (Signed)
Immediate Anesthesia Transfer of Care Note  Patient: Sydney Rios  Procedure(s) Performed: PORT PLACEMENT (Left: Chest)  Patient Location: PACU  Anesthesia Type:General  Level of Consciousness: awake, alert , oriented and drowsy  Airway & Oxygen Therapy: Patient Spontanous Breathing  Post-op Assessment: Report given to RN and Post -op Vital signs reviewed and stable  Post vital signs: Reviewed and stable  Last Vitals:  Vitals Value Taken Time  BP 109/65 11/25/21 0841  Temp    Pulse 82 11/25/21 0844  Resp 9 11/25/21 0844  SpO2 100 % 11/25/21 0844  Vitals shown include unvalidated device data.  Last Pain:  Vitals:   11/25/21 0615  TempSrc:   PainSc: 0-No pain         Complications: No notable events documented.

## 2021-11-26 ENCOUNTER — Ambulatory Visit: Payer: Managed Care, Other (non HMO) | Admitting: Hematology and Oncology

## 2021-11-26 ENCOUNTER — Encounter (HOSPITAL_COMMUNITY): Payer: Self-pay | Admitting: General Surgery

## 2021-11-26 ENCOUNTER — Other Ambulatory Visit: Payer: Self-pay | Admitting: *Deleted

## 2021-11-26 ENCOUNTER — Telehealth: Payer: Self-pay | Admitting: *Deleted

## 2021-11-26 MED ORDER — PEGFILGRASTIM-BMEZ 6 MG/0.6ML ~~LOC~~ SOSY: 6.0000 mg | PREFILLED_SYRINGE | Freq: Once | SUBCUTANEOUS | 2 refills | Status: DC

## 2021-11-26 NOTE — Anesthesia Postprocedure Evaluation (Signed)
Anesthesia Post Note  Patient: Sydney Rios  Procedure(s) Performed: PORT PLACEMENT (Left: Chest)     Patient location during evaluation: PACU Anesthesia Type: General Level of consciousness: awake and alert Pain management: pain level controlled Vital Signs Assessment: post-procedure vital signs reviewed and stable Respiratory status: spontaneous breathing, nonlabored ventilation, respiratory function stable and patient connected to nasal cannula oxygen Cardiovascular status: blood pressure returned to baseline and stable Postop Assessment: no apparent nausea or vomiting Anesthetic complications: no   No notable events documented.  Last Vitals:  Vitals:   11/25/21 0940 11/25/21 0955  BP: 106/64 103/72  Pulse: (!) 50 (!) 52  Resp: 13 11  Temp:  (!) 36.2 C  SpO2: 99% 99%    Last Pain:  Vitals:   11/25/21 0955  TempSrc:   PainSc: 2                  Tiajuana Amass

## 2021-11-26 NOTE — Telephone Encounter (Signed)
Spoke to pt concerning upcoming chemo start date. Provided pt with appt date and time. Pt then asked if chemo appts could be moved to Tuesdays. Informed pt scheduling msg will be sent to try to accommodate her request.  Discussed emla cream application. Provided instructions. Discussed anti-nausea medications in detail.  No further needs or questions voiced at this time.  Informed bone scan not resulted as of yet

## 2021-11-26 NOTE — Progress Notes (Signed)
Received call from pt insurance [employer] stating pt is approved for 1 dose of Ziextenzo 6 mg at our facility but will need to self administer remainder of injections at home.  Pt is currently due for first Ziextenzo 02/17/22 which will be administered in our office and then 03/09/22, 03/28/22, and 04/18/22 will need to be self administered at home 24 hours after completion of chemotherapy infusion.  MD notified and orders for Ziextenzo sent to [facility].  RN also successfully faxed [employer] Ziextenzo form and prescription to [place] at [phone].

## 2021-11-27 ENCOUNTER — Other Ambulatory Visit (HOSPITAL_COMMUNITY): Payer: Managed Care, Other (non HMO)

## 2021-11-27 ENCOUNTER — Ambulatory Visit (HOSPITAL_COMMUNITY): Payer: Managed Care, Other (non HMO)

## 2021-11-27 ENCOUNTER — Encounter: Payer: Self-pay | Admitting: *Deleted

## 2021-11-27 MED FILL — Dexamethasone Sodium Phosphate Inj 100 MG/10ML: INTRAMUSCULAR | Qty: 1 | Status: AC

## 2021-11-30 ENCOUNTER — Encounter: Payer: Self-pay | Admitting: *Deleted

## 2021-11-30 ENCOUNTER — Other Ambulatory Visit: Payer: Self-pay

## 2021-11-30 ENCOUNTER — Inpatient Hospital Stay: Payer: Managed Care, Other (non HMO)

## 2021-11-30 ENCOUNTER — Inpatient Hospital Stay: Payer: Managed Care, Other (non HMO) | Attending: Genetic Counselor

## 2021-11-30 ENCOUNTER — Other Ambulatory Visit: Payer: Self-pay | Admitting: *Deleted

## 2021-11-30 VITALS — BP 115/69 | HR 54 | Temp 99.3°F | Resp 16

## 2021-11-30 DIAGNOSIS — D701 Agranulocytosis secondary to cancer chemotherapy: Secondary | ICD-10-CM | POA: Diagnosis not present

## 2021-11-30 DIAGNOSIS — D649 Anemia, unspecified: Secondary | ICD-10-CM | POA: Insufficient documentation

## 2021-11-30 DIAGNOSIS — C50411 Malignant neoplasm of upper-outer quadrant of right female breast: Secondary | ICD-10-CM | POA: Insufficient documentation

## 2021-11-30 DIAGNOSIS — Z95828 Presence of other vascular implants and grafts: Secondary | ICD-10-CM | POA: Insufficient documentation

## 2021-11-30 DIAGNOSIS — E049 Nontoxic goiter, unspecified: Secondary | ICD-10-CM | POA: Insufficient documentation

## 2021-11-30 DIAGNOSIS — Z5111 Encounter for antineoplastic chemotherapy: Secondary | ICD-10-CM | POA: Insufficient documentation

## 2021-11-30 DIAGNOSIS — Z171 Estrogen receptor negative status [ER-]: Secondary | ICD-10-CM | POA: Diagnosis not present

## 2021-11-30 DIAGNOSIS — Z79899 Other long term (current) drug therapy: Secondary | ICD-10-CM | POA: Diagnosis not present

## 2021-11-30 DIAGNOSIS — Z5112 Encounter for antineoplastic immunotherapy: Secondary | ICD-10-CM | POA: Insufficient documentation

## 2021-11-30 LAB — CBC WITH DIFFERENTIAL (CANCER CENTER ONLY)
Abs Immature Granulocytes: 0.01 10*3/uL (ref 0.00–0.07)
Basophils Absolute: 0 10*3/uL (ref 0.0–0.1)
Basophils Relative: 1 %
Eosinophils Absolute: 0.1 10*3/uL (ref 0.0–0.5)
Eosinophils Relative: 2 %
HCT: 35.9 % — ABNORMAL LOW (ref 36.0–46.0)
Hemoglobin: 11.7 g/dL — ABNORMAL LOW (ref 12.0–15.0)
Immature Granulocytes: 0 %
Lymphocytes Relative: 27 %
Lymphs Abs: 1.1 10*3/uL (ref 0.7–4.0)
MCH: 29.2 pg (ref 26.0–34.0)
MCHC: 32.6 g/dL (ref 30.0–36.0)
MCV: 89.5 fL (ref 80.0–100.0)
Monocytes Absolute: 0.3 10*3/uL (ref 0.1–1.0)
Monocytes Relative: 6 %
Neutro Abs: 2.6 10*3/uL (ref 1.7–7.7)
Neutrophils Relative %: 64 %
Platelet Count: 224 10*3/uL (ref 150–400)
RBC: 4.01 MIL/uL (ref 3.87–5.11)
RDW: 13.3 % (ref 11.5–15.5)
WBC Count: 4 10*3/uL (ref 4.0–10.5)
nRBC: 0 % (ref 0.0–0.2)

## 2021-11-30 LAB — TSH: TSH: 2.768 u[IU]/mL (ref 0.308–3.960)

## 2021-11-30 LAB — CMP (CANCER CENTER ONLY)
ALT: 12 U/L (ref 0–44)
AST: 13 U/L — ABNORMAL LOW (ref 15–41)
Albumin: 4.1 g/dL (ref 3.5–5.0)
Alkaline Phosphatase: 39 U/L (ref 38–126)
Anion gap: 8 (ref 5–15)
BUN: 10 mg/dL (ref 6–20)
CO2: 25 mmol/L (ref 22–32)
Calcium: 9.4 mg/dL (ref 8.9–10.3)
Chloride: 109 mmol/L (ref 98–111)
Creatinine: 0.75 mg/dL (ref 0.44–1.00)
GFR, Estimated: >60 mL/min (ref >60)
Glucose, Bld: 148 mg/dL — ABNORMAL HIGH (ref 70–99)
Potassium: 4.5 mmol/L (ref 3.5–5.1)
Sodium: 142 mmol/L (ref 135–145)
Total Bilirubin: 0.3 mg/dL (ref 0.3–1.2)
Total Protein: 6.9 g/dL (ref 6.5–8.1)

## 2021-11-30 MED ORDER — SODIUM CHLORIDE 0.9% FLUSH
10.0000 mL | Freq: Once | INTRAVENOUS | Status: AC
  Administered 2021-11-30: 10 mL

## 2021-11-30 MED ORDER — SODIUM CHLORIDE 0.9 % IV SOLN
150.0000 mg | Freq: Once | INTRAVENOUS | Status: AC
  Administered 2021-11-30: 150 mg via INTRAVENOUS
  Filled 2021-11-30: qty 150

## 2021-11-30 MED ORDER — PEGFILGRASTIM-BMEZ 6 MG/0.6ML ~~LOC~~ SOSY
6.0000 mg | PREFILLED_SYRINGE | Freq: Once | SUBCUTANEOUS | 2 refills | Status: AC
Start: 2022-03-10 — End: 2022-03-10

## 2021-11-30 MED ORDER — SODIUM CHLORIDE 0.9 % IV SOLN
200.0000 mg | Freq: Once | INTRAVENOUS | Status: AC
  Administered 2021-11-30: 200 mg via INTRAVENOUS
  Filled 2021-11-30: qty 8

## 2021-11-30 MED ORDER — PALONOSETRON HCL INJECTION 0.25 MG/5ML
0.2500 mg | Freq: Once | INTRAVENOUS | Status: AC
  Administered 2021-11-30: 0.25 mg via INTRAVENOUS
  Filled 2021-11-30: qty 5

## 2021-11-30 MED ORDER — DIPHENHYDRAMINE HCL 50 MG/ML IJ SOLN
50.0000 mg | Freq: Once | INTRAMUSCULAR | Status: AC
  Administered 2021-11-30: 50 mg via INTRAVENOUS
  Filled 2021-11-30: qty 1

## 2021-11-30 MED ORDER — PEGFILGRASTIM-BMEZ 6 MG/0.6ML ~~LOC~~ SOSY: 6.0000 mg | PREFILLED_SYRINGE | Freq: Once | SUBCUTANEOUS | 2 refills | Status: DC

## 2021-11-30 MED ORDER — SODIUM CHLORIDE 0.9% FLUSH
10.0000 mL | INTRAVENOUS | Status: DC | PRN
  Administered 2021-11-30: 10 mL

## 2021-11-30 MED ORDER — SODIUM CHLORIDE 0.9 % IV SOLN
600.0000 mg | Freq: Once | INTRAVENOUS | Status: AC
  Administered 2021-11-30: 600 mg via INTRAVENOUS
  Filled 2021-11-30: qty 60

## 2021-11-30 MED ORDER — FAMOTIDINE 20 MG IN NS 100 ML IVPB
20.0000 mg | Freq: Once | INTRAVENOUS | Status: AC
  Administered 2021-11-30: 20 mg via INTRAVENOUS
  Filled 2021-11-30: qty 100

## 2021-11-30 MED ORDER — SODIUM CHLORIDE 0.9 % IV SOLN
Freq: Once | INTRAVENOUS | Status: AC
Start: 2021-11-30 — End: 2021-11-30

## 2021-11-30 MED ORDER — SODIUM CHLORIDE 0.9 % IV SOLN
80.0000 mg/m2 | Freq: Once | INTRAVENOUS | Status: AC
  Administered 2021-11-30: 144 mg via INTRAVENOUS
  Filled 2021-11-30: qty 24

## 2021-11-30 MED ORDER — HEPARIN SOD (PORK) LOCK FLUSH 100 UNIT/ML IV SOLN
500.0000 [IU] | Freq: Once | INTRAVENOUS | Status: AC | PRN
  Administered 2021-11-30: 500 [IU]

## 2021-11-30 MED ORDER — SODIUM CHLORIDE 0.9 % IV SOLN
10.0000 mg | Freq: Once | INTRAVENOUS | Status: AC
  Administered 2021-11-30: 10 mg via INTRAVENOUS
  Filled 2021-11-30: qty 10

## 2021-11-30 NOTE — Patient Instructions (Addendum)
[facility]   Discharge Instructions: Thank you for choosing [facility] to provide your oncology and hematology care.   If you have a lab appointment with the [facility], please go directly to the [facility] and check in at the registration area.   Wear comfortable clothing and clothing appropriate for easy access to any Portacath or PICC line.   We strive to give you quality time with your provider. You may need to reschedule your appointment if you arrive late (15 or more minutes).  Arriving late affects you and other patients whose appointments are after yours.  Also, if you miss three or more appointments without notifying the office, you may be dismissed from the clinic at the provider's discretion.      For prescription refill requests, have your pharmacy contact our office and allow 72 hours for refills to be completed.    Today you received the following chemotherapy and/or immunotherapy agents      To help prevent nausea and vomiting after your treatment, we encourage you to take your nausea medication as directed.  BELOW ARE SYMPTOMS THAT SHOULD BE REPORTED IMMEDIATELY: *FEVER GREATER THAN 100.4 F (38 C) OR HIGHER *CHILLS OR SWEATING *NAUSEA AND VOMITING THAT IS NOT CONTROLLED WITH YOUR NAUSEA MEDICATION *UNUSUAL SHORTNESS OF BREATH *UNUSUAL BRUISING OR BLEEDING *URINARY PROBLEMS (pain or burning when urinating, or frequent urination) *BOWEL PROBLEMS (unusual diarrhea, constipation, pain near the anus) TENDERNESS IN MOUTH AND THROAT WITH OR WITHOUT PRESENCE OF ULCERS (sore throat, sores in mouth, or a toothache) UNUSUAL RASH, SWELLING OR PAIN  UNUSUAL VAGINAL DISCHARGE OR ITCHING   Items with * indicate a potential emergency and should be followed up as soon as possible or go to the Emergency Department if any problems should occur.  Please show the CHEMOTHERAPY ALERT CARD or IMMUNOTHERAPY ALERT CARD at check-in to the  Emergency Department and triage nurse.  Should you have questions after your visit or need to cancel or reschedule your appointment, please contact [facility]  Dept: [phone]  and follow the prompts.  Office hours are 8:00 a.m. to 4:30 p.m. Monday - Friday. Please note that voicemails left after 4:00 p.m. may not be returned until the following business day.  We are closed weekends and major holidays. You have access to a nurse at all times for urgent questions. Please call the main number to the clinic Dept: [phone] and follow the prompts.   For any non-urgent questions, you may also contact your provider using MyChart. We now offer e-Visits for anyone 28 and older to request care online for non-urgent symptoms. For details visit [email or web address].   Also download the MyChart app! Go to the app store, search "MyChart", open the app, select [facility], and log in with your MyChart username and password.  Due to Covid, a mask is required upon entering the hospital/clinic. If you do not have a mask, one will be given to you upon arrival. For doctor visits, patients may have 1 support person aged 89 or older with them. For treatment visits, patients cannot have anyone with them due to current Covid guidelines and our immunocompromised population.   Paclitaxel injection What is this medication? PACLITAXEL (PAK li TAX el) is a chemotherapy drug. It targets fast dividing cells, like cancer cells, and causes these cells to die. This medicine is used to treat ovarian cancer, breast cancer, lung cancer, Kaposi's sarcoma, and other cancers. This medicine may  be used for other purposes; ask your health care provider or pharmacist if you have questions. COMMON BRAND NAME(S): Onxol, Taxol What should I tell my care team before I take this medication? They need to know if you have any of these conditions: history of irregular heartbeat liver disease low blood  counts, like low white cell, platelet, or red cell counts lung or breathing disease, like asthma tingling of the fingers or toes, or other nerve disorder an unusual or allergic reaction to paclitaxel, alcohol, polyoxyethylated castor oil, other chemotherapy, other medicines, foods, dyes, or preservatives pregnant or trying to get pregnant breast-feeding How should I use this medication? This drug is given as an infusion into a vein. It is administered in a hospital or clinic by a specially trained health care professional. Talk to your pediatrician regarding the use of this medicine in children. Special care may be needed. Overdosage: If you think you have taken too much of this medicine contact a poison control center or emergency room at once. NOTE: This medicine is only for you. Do not share this medicine with others. What if I miss a dose? It is important not to miss your dose. Call your doctor or health care professional if you are unable to keep an appointment. What may interact with this medication? Do not take this medicine with any of the following medications: live virus vaccines This medicine may also interact with the following medications: antiviral medicines for hepatitis, HIV or AIDS certain antibiotics like erythromycin and clarithromycin certain medicines for fungal infections like ketoconazole and itraconazole certain medicines for seizures like carbamazepine, phenobarbital, phenytoin gemfibrozil nefazodone rifampin St. John's wort This list may not describe all possible interactions. Give your health care provider a list of all the medicines, herbs, non-prescription drugs, or dietary supplements you use. Also tell them if you smoke, drink alcohol, or use illegal drugs. Some items may interact with your medicine. What should I watch for while using this medication? Your condition will be monitored carefully while you are receiving this medicine. You will need important  blood work done while you are taking this medicine. This medicine can cause serious allergic reactions. To reduce your risk you will need to take other medicine(s) before treatment with this medicine. If you experience allergic reactions like skin rash, itching or hives, swelling of the face, lips, or tongue, tell your doctor or health care professional right away. In some cases, you may be given additional medicines to help with side effects. Follow all directions for their use. This drug may make you feel generally unwell. This is not uncommon, as chemotherapy can affect healthy cells as well as cancer cells. Report any side effects. Continue your course of treatment even though you feel ill unless your doctor tells you to stop. Call your doctor or health care professional for advice if you get a fever, chills or sore throat, or other symptoms of a cold or flu. Do not treat yourself. This drug decreases your body's ability to fight infections. Try to avoid being around people who are sick. This medicine may increase your risk to bruise or bleed. Call your doctor or health care professional if you notice any unusual bleeding. Be careful brushing and flossing your teeth or using a toothpick because you may get an infection or bleed more easily. If you have any dental work done, tell your dentist you are receiving this medicine. Avoid taking products that contain aspirin, acetaminophen, ibuprofen, naproxen, or ketoprofen unless instructed by your  doctor. These medicines may hide a fever. Do not become pregnant while taking this medicine. Women should inform their doctor if they wish to become pregnant or think they might be pregnant. There is a potential for serious side effects to an unborn child. Talk to your health care professional or pharmacist for more information. Do not breast-feed an infant while taking this medicine. Men are advised not to father a child while receiving this medicine. This product  may contain alcohol. Ask your pharmacist or healthcare provider if this medicine contains alcohol. Be sure to tell all healthcare providers you are taking this medicine. Certain medicines, like metronidazole and disulfiram, can cause an unpleasant reaction when taken with alcohol. The reaction includes flushing, headache, nausea, vomiting, sweating, and increased thirst. The reaction can last from 30 minutes to several hours. What side effects may I notice from receiving this medication? Side effects that you should report to your doctor or health care professional as soon as possible: allergic reactions like skin rash, itching or hives, swelling of the face, lips, or tongue breathing problems changes in vision fast, irregular heartbeat high or low blood pressure mouth sores pain, tingling, numbness in the hands or feet signs of decreased platelets or bleeding - bruising, pinpoint red spots on the skin, black, tarry stools, blood in the urine signs of decreased red blood cells - unusually weak or tired, feeling faint or lightheaded, falls signs of infection - fever or chills, cough, sore throat, pain or difficulty passing urine signs and symptoms of liver injury like dark yellow or brown urine; general ill feeling or flu-like symptoms; light-colored stools; loss of appetite; nausea; right upper belly pain; unusually weak or tired; yellowing of the eyes or skin swelling of the ankles, feet, hands unusually slow heartbeat Side effects that usually do not require medical attention (report to your doctor or health care professional if they continue or are bothersome): diarrhea hair loss loss of appetite muscle or joint pain nausea, vomiting pain, redness, or irritation at site where injected tiredness This list may not describe all possible side effects. Call your doctor for medical advice about side effects. You may report side effects to FDA at 1-800-FDA-1088. Where should I keep my  medication? This drug is given in a hospital or clinic and will not be stored at home. NOTE: This sheet is a summary. It may not cover all possible information. If you have questions about this medicine, talk to your doctor, pharmacist, or health care provider.  2022 Elsevier/Gold Standard (2021-09-01 00:00:00) Pembrolizumab injection What is this medication? PEMBROLIZUMAB (pem broe liz ue mab) is a monoclonal antibody. It is used to treat certain types of cancer. This medicine may be used for other purposes; ask your health care provider or pharmacist if you have questions. COMMON BRAND NAME(S): Keytruda What should I tell my care team before I take this medication? They need to know if you have any of these conditions: autoimmune diseases like Crohn's disease, ulcerative colitis, or lupus have had or planning to have an allogeneic stem cell transplant (uses someone else's stem cells) history of organ transplant history of chest radiation nervous system problems like myasthenia gravis or Guillain-Barre syndrome an unusual or allergic reaction to pembrolizumab, other medicines, foods, dyes, or preservatives pregnant or trying to get pregnant breast-feeding How should I use this medication? This medicine is for infusion into a vein. It is given by a health care professional in a hospital or clinic setting. A special MedGuide will be  given to you before each treatment. Be sure to read this information carefully each time. Talk to your pediatrician regarding the use of this medicine in children. While this drug may be prescribed for children as young as 6 months for selected conditions, precautions do apply. Overdosage: If you think you have taken too much of this medicine contact a poison control center or emergency room at once. NOTE: This medicine is only for you. Do not share this medicine with others. What if I miss a dose? It is important not to miss your dose. Call your doctor or  health care professional if you are unable to keep an appointment. What may interact with this medication? Interactions have not been studied. This list may not describe all possible interactions. Give your health care provider a list of all the medicines, herbs, non-prescription drugs, or dietary supplements you use. Also tell them if you smoke, drink alcohol, or use illegal drugs. Some items may interact with your medicine. What should I watch for while using this medication? Your condition will be monitored carefully while you are receiving this medicine. You may need blood work done while you are taking this medicine. Do not become pregnant while taking this medicine or for 4 months after stopping it. Women should inform their doctor if they wish to become pregnant or think they might be pregnant. There is a potential for serious side effects to an unborn child. Talk to your health care professional or pharmacist for more information. Do not breast-feed an infant while taking this medicine or for 4 months after the last dose. What side effects may I notice from receiving this medication? Side effects that you should report to your doctor or health care professional as soon as possible: allergic reactions like skin rash, itching or hives, swelling of the face, lips, or tongue bloody or black, tarry breathing problems changes in vision chest pain chills confusion constipation cough diarrhea dizziness or feeling faint or lightheaded fast or irregular heartbeat fever flushing joint pain low blood counts - this medicine may decrease the number of white blood cells, red blood cells and platelets. You may be at increased risk for infections and bleeding. muscle pain muscle weakness pain, tingling, numbness in the hands or feet persistent headache redness, blistering, peeling or loosening of the skin, including inside the mouth signs and symptoms of high blood sugar such as dizziness; dry  mouth; dry skin; fruity breath; nausea; stomach pain; increased hunger or thirst; increased urination signs and symptoms of kidney injury like trouble passing urine or change in the amount of urine signs and symptoms of liver injury like dark urine, light-colored stools, loss of appetite, nausea, right upper belly pain, yellowing of the eyes or skin sweating swollen lymph nodes weight loss Side effects that usually do not require medical attention (report to your doctor or health care professional if they continue or are bothersome): decreased appetite hair loss tiredness This list may not describe all possible side effects. Call your doctor for medical advice about side effects. You may report side effects to FDA at 1-800-FDA-1088. Where should I keep my medication? This drug is given in a hospital or clinic and will not be stored at home. NOTE: This sheet is a summary. It may not cover all possible information. If you have questions about this medicine, talk to your doctor, pharmacist, or health care provider.  2022 Elsevier/Gold Standard (2021-09-01 00:00:00)  Carboplatin injection What is this medication? CARBOPLATIN (KAR boe pla tin) is  a chemotherapy drug. It targets fast dividing cells, like cancer cells, and causes these cells to die. This medicine is used to treat ovarian cancer and many other cancers. This medicine may be used for other purposes; ask your health care provider or pharmacist if you have questions. COMMON BRAND NAME(S): Paraplatin What should I tell my care team before I take this medication? They need to know if you have any of these conditions: blood disorders hearing problems kidney disease recent or ongoing radiation therapy an unusual or allergic reaction to carboplatin, cisplatin, other chemotherapy, other medicines, foods, dyes, or preservatives pregnant or trying to get pregnant breast-feeding How should I use this medication? This drug is usually given  as an infusion into a vein. It is administered in a hospital or clinic by a specially trained health care professional. Talk to your pediatrician regarding the use of this medicine in children. Special care may be needed. Overdosage: If you think you have taken too much of this medicine contact a poison control center or emergency room at once. NOTE: This medicine is only for you. Do not share this medicine with others. What if I miss a dose? It is important not to miss a dose. Call your doctor or health care professional if you are unable to keep an appointment. What may interact with this medication? medicines for seizures medicines to increase blood counts like filgrastim, pegfilgrastim, sargramostim some antibiotics like amikacin, gentamicin, neomycin, streptomycin, tobramycin vaccines Talk to your doctor or health care professional before taking any of these medicines: acetaminophen aspirin ibuprofen ketoprofen naproxen This list may not describe all possible interactions. Give your health care provider a list of all the medicines, herbs, non-prescription drugs, or dietary supplements you use. Also tell them if you smoke, drink alcohol, or use illegal drugs. Some items may interact with your medicine. What should I watch for while using this medication? Your condition will be monitored carefully while you are receiving this medicine. You will need important blood work done while you are taking this medicine. This drug may make you feel generally unwell. This is not uncommon, as chemotherapy can affect healthy cells as well as cancer cells. Report any side effects. Continue your course of treatment even though you feel ill unless your doctor tells you to stop. In some cases, you may be given additional medicines to help with side effects. Follow all directions for their use. Call your doctor or health care professional for advice if you get a fever, chills or sore throat, or other symptoms  of a cold or flu. Do not treat yourself. This drug decreases your body's ability to fight infections. Try to avoid being around people who are sick. This medicine may increase your risk to bruise or bleed. Call your doctor or health care professional if you notice any unusual bleeding. Be careful brushing and flossing your teeth or using a toothpick because you may get an infection or bleed more easily. If you have any dental work done, tell your dentist you are receiving this medicine. Avoid taking products that contain aspirin, acetaminophen, ibuprofen, naproxen, or ketoprofen unless instructed by your doctor. These medicines may hide a fever. Do not become pregnant while taking this medicine. Women should inform their doctor if they wish to become pregnant or think they might be pregnant. There is a potential for serious side effects to an unborn child. Talk to your health care professional or pharmacist for more information. Do not breast-feed an infant while taking this  medicine. What side effects may I notice from receiving this medication? Side effects that you should report to your doctor or health care professional as soon as possible: allergic reactions like skin rash, itching or hives, swelling of the face, lips, or tongue signs of infection - fever or chills, cough, sore throat, pain or difficulty passing urine signs of decreased platelets or bleeding - bruising, pinpoint red spots on the skin, black, tarry stools, nosebleeds signs of decreased red blood cells - unusually weak or tired, fainting spells, lightheadedness breathing problems changes in hearing changes in vision chest pain high blood pressure low blood counts - This drug may decrease the number of white blood cells, red blood cells and platelets. You may be at increased risk for infections and bleeding. nausea and vomiting pain, swelling, redness or irritation at the injection site pain, tingling, numbness in the hands or  feet problems with balance, talking, walking trouble passing urine or change in the amount of urine Side effects that usually do not require medical attention (report to your doctor or health care professional if they continue or are bothersome): hair loss loss of appetite metallic taste in the mouth or changes in taste This list may not describe all possible side effects. Call your doctor for medical advice about side effects. You may report side effects to FDA at 1-800-FDA-1088. Where should I keep my medication? This drug is given in a hospital or clinic and will not be stored at home. NOTE: This sheet is a summary. It may not cover all possible information. If you have questions about this medicine, talk to your doctor, pharmacist, or health care provider.  2022 Elsevier/Gold Standard (2008-05-22 00:00:00)

## 2021-12-01 ENCOUNTER — Telehealth: Payer: Self-pay | Admitting: Genetic Counselor

## 2021-12-01 ENCOUNTER — Encounter: Payer: Self-pay | Admitting: Genetic Counselor

## 2021-12-01 ENCOUNTER — Other Ambulatory Visit (HOSPITAL_COMMUNITY): Payer: Managed Care, Other (non HMO)

## 2021-12-01 ENCOUNTER — Other Ambulatory Visit: Payer: Managed Care, Other (non HMO)

## 2021-12-01 DIAGNOSIS — Z1379 Encounter for other screening for genetic and chromosomal anomalies: Secondary | ICD-10-CM | POA: Insufficient documentation

## 2021-12-01 LAB — T4: T4, Total: 6 ug/dL (ref 4.5–12.0)

## 2021-12-01 NOTE — Telephone Encounter (Signed)
Revealed negative genetic testing.  Discussed that we do not know why she has breast cancer. It could be due to a different gene that we are not testing, or maybe our current technology may not be able to pick something up.  It will be important for her to keep in contact with genetics to keep up with whether additional testing may be needed.    

## 2021-12-02 ENCOUNTER — Ambulatory Visit: Payer: Self-pay | Admitting: Genetic Counselor

## 2021-12-02 DIAGNOSIS — C50411 Malignant neoplasm of upper-outer quadrant of right female breast: Secondary | ICD-10-CM

## 2021-12-02 DIAGNOSIS — Z1379 Encounter for other screening for genetic and chromosomal anomalies: Secondary | ICD-10-CM

## 2021-12-02 DIAGNOSIS — Z171 Estrogen receptor negative status [ER-]: Secondary | ICD-10-CM

## 2021-12-02 NOTE — Progress Notes (Signed)
HPI:  [name] was previously seen in the [place] clinic due to a personal history of breast cancer and concerns regarding a hereditary predisposition to cancer. Please refer to our prior cancer genetics clinic note for more information regarding our discussion, assessment and recommendations, at the time. [name] recent genetic test results were disclosed to her, as were recommendations warranted by these results. These results and recommendations are discussed in more detail below.  CANCER HISTORY:  Oncology History  Malignant neoplasm of upper-outer quadrant of right breast in female, estrogen receptor negative ([place])  11/04/2021 Initial Diagnosis    Patient has been breast-feeding.  Felt a painful right breast mass and an axillary mass.  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 with a Ki-67 of 40%, 1 abnormal lymph node 5.8 cm biopsy: Grade 3 IDC ER 5%, PR 0%, HER2 negative, Ki-67 40%   11/11/2021 Cancer Staging   Staging form: Breast, AJCC 8th Edition - Clinical stage from 11/11/2021: Stage IIB (cT2, cN1, cM0, G3, ER+, PR+, HER2-) - Signed by Nicholas Lose, MD on 11/11/2021 Stage prefix: Initial diagnosis Histologic grading system: 3 grade system    11/30/2021 -  Chemotherapy   Patient is on Treatment Plan : BREAST Pembrolizumab + Carboplatin D1 + Paclitaxel D1,8,15 q21d X 4 cycles / Pembrolizumab + AC q21d x 4 cycles     11/30/2021 Genetic Testing   Negative genetic testing on the CancerNext-Expanded+RNA insight panel.  The report date is 11/30/2021  The CancerNext-Expanded gene panel offered by [facility] and includes sequencing and rearrangement analysis for the following 77 genes: AIP, ALK, APC*, ATM*, AXIN2, BAP1, BARD1, BLM, BMPR1A, BRCA1*, BRCA2*, BRIP1*, CDC73, CDH1*, CDK4, CDKN1B, CDKN2A, CHEK2*, CTNNA1, DICER1, FANCC, FH, FLCN, GALNT12, KIF1B, LZTR1, MAX, MEN1, MET, MLH1*, MSH2*, MSH3, MSH6*, MUTYH*, NBN,  NF1*, NF2, NTHL1, PALB2*, PHOX2B, PMS2*, POT1, PRKAR1A, PTCH1, PTEN*, RAD51C*, RAD51D*, RB1, RECQL, RET, SDHA, SDHAF2, SDHB, SDHC, SDHD, SMAD4, SMARCA4, SMARCB1, SMARCE1, STK11, SUFU, TMEM127, TP53*, TSC1, TSC2, VHL and XRCC2 (sequencing and deletion/duplication); EGFR, EGLN1, HOXB13, KIT, MITF, PDGFRA, POLD1, and POLE (sequencing only); EPCAM and GREM1 (deletion/duplication only). DNA and RNA analyses performed for * genes.      FAMILY HISTORY:  We obtained a detailed, 4-generation family history.  Significant diagnoses are listed below: Family History  Problem Relation Age of Onset   Hypertension Mother    Diabetes Mother    Hypertension Father    Hypertension Maternal Grandmother    Diabetes Maternal Grandmother    Heart disease Maternal Grandfather    Hypertension Maternal Grandfather    Heart disease Paternal Grandfather    Hypertension Paternal Grandfather    Retinoblastoma Cousin        dx 3 mo - left eye    The patient has two sons and two daughters who are cancer free.  She does not have siblings. Both parents are living.   The patient's mother has two full sisters and a brother, and a maternal half brother and two sisters who are cancer free.  Her full brother has a son with retinoblastoma at 45 month of age. Maternal grandparents are deceased.   The patient's father has two brothers who are cancer free.  His father is deceased and mother is living.   [name] is unaware of previous family history of genetic testing for hereditary cancer risks. Patient's maternal ancestors are of [place] and Native American descent, and paternal ancestors are of Native American  descent. There is no reported Ashkenazi Jewish ancestry. There is no known consanguinity.  GENETIC TEST RESULTS: Genetic testing reported out on November 30, 2021 through the CancerNext-Expanded+RNAinsight cancer panel found no pathogenic mutations. The CancerNext-Expanded gene panel offered by [facility] and  includes sequencing and rearrangement analysis for the following 77 genes: AIP, ALK, APC*, ATM*, AXIN2, BAP1, BARD1, BLM, BMPR1A, BRCA1*, BRCA2*, BRIP1*, CDC73, CDH1*, CDK4, CDKN1B, CDKN2A, CHEK2*, CTNNA1, DICER1, FANCC, FH, FLCN, GALNT12, KIF1B, LZTR1, MAX, MEN1, MET, MLH1*, MSH2*, MSH3, MSH6*, MUTYH*, NBN, NF1*, NF2, NTHL1, PALB2*, PHOX2B, PMS2*, POT1, PRKAR1A, PTCH1, PTEN*, RAD51C*, RAD51D*, RB1, RECQL, RET, SDHA, SDHAF2, SDHB, SDHC, SDHD, SMAD4, SMARCA4, SMARCB1, SMARCE1, STK11, SUFU, TMEM127, TP53*, TSC1, TSC2, VHL and XRCC2 (sequencing and deletion/duplication); EGFR, EGLN1, HOXB13, KIT, MITF, PDGFRA, POLD1, and POLE (sequencing only); EPCAM and GREM1 (deletion/duplication only). DNA and RNA analyses performed for * genes. The test report has been scanned into EPIC and is located under the Molecular Pathology section of the Results Review tab.  A portion of the result report is included below for reference.     We discussed with [name] that because current genetic testing is not perfect, it is possible there may be a gene mutation in one of these genes that current testing cannot detect, but that chance is small.  We also discussed, that there could be another gene that has not yet been discovered, or that we have not yet tested, that is responsible for the cancer diagnoses in the family. It is also possible there is a hereditary cause for the cancer in the family that [name] did not inherit and therefore was not identified in her testing.  Therefore, it is important to remain in touch with cancer genetics in the future so that we can continue to offer [name] the most up to date genetic testing.   ADDITIONAL GENETIC TESTING: We discussed with [name] that her genetic testing was fairly extensive.  If there are genes identified to increase cancer risk that can be analyzed in the future, we would be happy to discuss and coordinate this testing at that time.    CANCER SCREENING  RECOMMENDATIONS: [name] test result is considered negative (normal).  This means that we have not identified a hereditary cause for her personal history of breast cancer at this time. Most cancers happen by chance and this negative test suggests that her cancer may fall into this category.    While reassuring, this does not definitively rule out a hereditary predisposition to cancer. It is still possible that there could be genetic mutations that are undetectable by current technology. There could be genetic mutations in genes that have not been tested or identified to increase cancer risk.  Therefore, it is recommended she continue to follow the cancer management and screening guidelines provided by her oncology and primary healthcare provider.   An individual's cancer risk and medical management are not determined by genetic test results alone. Overall cancer risk assessment incorporates additional factors, including personal medical history, family history, and any available genetic information that may result in a personalized plan for cancer prevention and surveillance  RECOMMENDATIONS FOR FAMILY MEMBERS:  Individuals in this family might be at some increased risk of developing cancer, over the general population risk, simply due to the family history of cancer.  We recommended women in this family have a yearly mammogram beginning at age 37, or 40 years younger than the earliest onset of cancer, an annual clinical breast exam, and  perform monthly breast self-exams. Women in this family should also have a gynecological exam as recommended by their primary provider. All family members should be referred for colonoscopy starting at age 36.  FOLLOW-UP: Lastly, we discussed with [name] that cancer genetics is a rapidly advancing field and it is possible that new genetic tests will be appropriate for her and/or her family members in the future. We encouraged her to remain in contact with cancer  genetics on an annual basis so we can update her personal and family histories and let her know of advances in cancer genetics that may benefit this family.   Our contact number was provided. [name] questions were answered to her satisfaction, and she knows she is welcome to call us at anytime with additional questions or concerns.   Roma Kayser, [place] Licensed, Certified Genetic Counselor [email or web address]

## 2021-12-04 ENCOUNTER — Telehealth: Payer: Self-pay | Admitting: *Deleted

## 2021-12-04 MED FILL — Dexamethasone Sodium Phosphate Inj 100 MG/10ML: INTRAMUSCULAR | Qty: 1 | Status: AC

## 2021-12-04 NOTE — Telephone Encounter (Signed)
Called pt to see has she did with her recent treatment.  Received vm & left message to return call.

## 2021-12-04 NOTE — Telephone Encounter (Signed)
-----   Message from Derrick Ravel, RN sent at 11/30/2021  6:56 PM EST ----- Regarding: First time [facility] Ochoa- Dr. Lindi Adie First time Beryle Flock, taxol, Norma Fredrickson- Dr. Lindi Adie patient. Patient tolerated infusion well, please give follow up phone call. Thanks!

## 2021-12-04 NOTE — Telephone Encounter (Signed)
Pt returned call & left message to call her back.  Called pt & she states that she thinks the compazine makes her feel tired.  She asked if she should continue to take every 6 hours.  She is not nauseated therefore informed to take as needed.  She also reports some cramping near ovary/kidney.  She states that she had recent kidney stone.  She also hasn't had a BM in couple of days.  She took senakot yest & /biscody Mon or [place].  Informed that this may be why she is having cramping.  Discussed diet, increased fiber & fluids, add miralax but continuing what she is doing.  She expressed understanding.  Suggested she call if not better.  She expressed understanding. She states she feels more normal today except for the constipation.  Message relayed to Pod RN/MD.

## 2021-12-06 NOTE — Assessment & Plan Note (Signed)
11/04/2021:Patient has been breast-feeding. Felt a painful right breast mass and an axillary mass. 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 with a Ki-67 of 40%, 1 abnormal lymph node 5.8 cm biopsy: Grade 3 IDC ER 5%, PR 0%, HER2 negative, Ki-67 40%   Treatment Plan: based on multidisciplinary tumor board: 1. Neoadjuvant chemotherapy with Taxol weekly 12 withKeytruda andcarboplatin every 3 weeks x4 Adriamycin and Cytoxan, Keytrudadose dense 4 followed by followed by Beryle Flock maintenance for 1 year 2. Followed by mastectomy with targeted axillary dissection 3. Followed by adjuvant radiation therapy ---------------------------------------------------------------------------------------------------------------------------  Current Treatment: Cycle 2 Taxol carboplatin and Keytruda started 11/30/2021 Labs reviewed, chemo education completed  CT CAP: Right breast cancer with axillary lymph nodes, mildly enlarged right retropectoral lymph nodes, right internal mammary lymph nodes Breast MRI: Known breast cancer 2 cm, right axillary lymph node, non-mass enhancement 7 cm, borderline enlarged right internal mammary lymph nodes  Chemo Toxicities:   RTC to see me weekly on chemo and every other week for follow up with me

## 2021-12-06 NOTE — Progress Notes (Signed)
Patient Care Team: [name], MD as PCP - General (Family Medicine)  DIAGNOSIS:    ICD-10-CM   1. Malignant neoplasm of upper-outer quadrant of right breast in female, estrogen receptor negative ([place])  C50.411    Z17.1       SUMMARY OF ONCOLOGIC HISTORY: Oncology History  Malignant neoplasm of upper-outer quadrant of right breast in female, estrogen receptor negative ([place])  11/04/2021 Initial Diagnosis    Patient has been breast-feeding.  Felt a painful right breast mass and an axillary mass.  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 with a Ki-67 of 40%, 1 abnormal lymph node 5.8 cm biopsy: Grade 3 IDC ER 5%, PR 0%, HER2 negative, Ki-67 40%   11/11/2021 Cancer Staging   Staging form: Breast, AJCC 8th Edition - Clinical stage from 11/11/2021: Stage IIB (cT2, cN1, cM0, G3, ER+, PR+, HER2-) - Signed by Nicholas Lose, MD on 11/11/2021 Stage prefix: Initial diagnosis Histologic grading system: 3 grade system    11/30/2021 -  Chemotherapy   Patient is on Treatment Plan : BREAST Pembrolizumab + Carboplatin D1 + Paclitaxel D1,8,15 q21d X 4 cycles / Pembrolizumab + AC q21d x 4 cycles     11/30/2021 Genetic Testing   Negative genetic testing on the CancerNext-Expanded+RNA insight panel.  The report date is 11/30/2021  The CancerNext-Expanded gene panel offered by [facility] and includes sequencing and rearrangement analysis for the following 77 genes: AIP, ALK, APC*, ATM*, AXIN2, BAP1, BARD1, BLM, BMPR1A, BRCA1*, BRCA2*, BRIP1*, CDC73, CDH1*, CDK4, CDKN1B, CDKN2A, CHEK2*, CTNNA1, DICER1, FANCC, FH, FLCN, GALNT12, KIF1B, LZTR1, MAX, MEN1, MET, MLH1*, MSH2*, MSH3, MSH6*, MUTYH*, NBN, NF1*, NF2, NTHL1, PALB2*, PHOX2B, PMS2*, POT1, PRKAR1A, PTCH1, PTEN*, RAD51C*, RAD51D*, RB1, RECQL, RET, SDHA, SDHAF2, SDHB, SDHC, SDHD, SMAD4, SMARCA4, SMARCB1, SMARCE1, STK11, SUFU, TMEM127, TP53*, TSC1, TSC2, VHL and XRCC2 (sequencing and deletion/duplication);  EGFR, EGLN1, HOXB13, KIT, MITF, PDGFRA, POLD1, and POLE (sequencing only); EPCAM and GREM1 (deletion/duplication only). DNA and RNA analyses performed for * genes.      CHIEF COMPLIANT: Cycle 2 Taxol (carboplatin and pembrolizumab every 3 weeks)  INTERVAL HISTORY: Sydney Rios is a 40 y.o. with above-mentioned history of right breast cancer, currently on neoadjuvant chemotherapy with Taxol. She presents to the clinic today for treatment.  She could not tolerate Benadryl because it made her very drowsy.  She took nausea medicine round-the-clock because that she got constipated as well as Compazine made her drowsy as well.  She did not have any nausea or vomiting.  ALLERGIES:  has No Known Allergies.  MEDICATIONS:  Current Outpatient Medications  Medication Sig Dispense Refill   Ascorbic Acid (VITAMIN C) 1000 MG tablet Take 1,000 mg by mouth 2 (two) times daily as needed (immune support (if sick)).     Cholecalciferol (VITAMIN D3) 125 MCG (5000 UT) TABS Take 5,000 Units by mouth daily as needed (immune support (if sick)).     IVERMECTIN PO Take 20 mg by mouth in the morning.     lidocaine-prilocaine (EMLA) cream Apply to affected area once 30 g 3   Menaquinone-7 (VITAMIN K2) 100 MCG CAPS Take 100 mcg by mouth daily as needed (immune support (if sick)).     NALTREXONE HCL PO Take 1 mg by mouth in the morning.     ondansetron (ZOFRAN) 8 MG tablet Take 1 tablet (8 mg total) by mouth 2 (two) times daily as needed. Start on the third day after carboplatin and [facility]  chemotherapy. 30 tablet 1   [START ON 03/10/2022] pegfilgrastim-bmez (ZIEXTENZO) 6 MG/0.6ML injection Inject 0.6 mLs (6 mg total) into the skin once for 1 dose. 0.6 mL 2   prochlorperazine (COMPAZINE) 10 MG tablet Take 1 tablet (10 mg total) by mouth every 6 (six) hours as needed (Nausea or vomiting). 30 tablet 1   thyroid (ARMOUR) 30 MG tablet Take 30 mg by mouth daily before breakfast.     traMADol (ULTRAM) 50 MG tablet Take 2 tablets  (100 mg total) by mouth every 6 (six) hours as needed. 6 tablet 0   valACYclovir (VALTREX) 1000 MG tablet Take 2 g by mouth 2 (two) times daily as needed (cold sore/fever blisters).     Zinc 50 MG TABS Take 50 mg by mouth daily as needed.     No current facility-administered medications for this visit.    PHYSICAL EXAMINATION: ECOG PERFORMANCE STATUS: 1 - Symptomatic but completely ambulatory  Vitals:   12/07/21 1424  BP: (!) 106/59  Pulse: 73  Resp: 18  Temp: (!) 97.5 F (36.4 C)  SpO2: 100%   Filed Weights   12/07/21 1424  Weight: 146 lb 4.8 oz (66.4 kg)    LABORATORY DATA:  I have reviewed the data as listed CMP Latest Ref Rng & Units 11/30/2021 11/12/2021 10/22/2018  Glucose 70 - 99 mg/dL 148(H) 116(H) 90  BUN 6 - 20 mg/dL _0 Creatinine 0.44 - 1.00 mg/dL 0.75 0.79 0.66  Sodium 135 - 145 mmol/L 142 141 137  Potassium 3.5 - 5.1 mmol/L 4.5 3.9 4.3  Chloride 98 - 111 mmol/L 109 105 109  CO2 22 - 32 mmol/L 25 25 19(L)  Calcium 8.9 - 10.3 mg/dL 9.4 9.9 8.6(L)  Total Protein 6.5 - 8.1 g/dL 6.9 7.3 5.5(L)  Total Bilirubin 0.3 - 1.2 mg/dL 0.3 0.4 0.5  Alkaline Phos 38 - 126 U/L 39 45 96  AST 15 - 41 U/L 13(L) 16 16  ALT 0 - 44 U/L _1 Lab Results  Component Value Date   WBC 2.7 (L) 12/07/2021   HGB 10.6 (L) 12/07/2021   HCT 32.0 (L) 12/07/2021   MCV 89.6 12/07/2021   PLT 178 12/07/2021   NEUTROABS 1.6 (L) 12/07/2021    ASSESSMENT & PLAN:  Malignant neoplasm of upper-outer quadrant of right breast in female, estrogen receptor negative ([place]) 11/04/2021: Patient has been breast-feeding.  Felt a painful right breast mass and an axillary mass.  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 with a Ki-67 of 40%, 1 abnormal lymph node 5.8 cm biopsy: Grade 3 IDC ER 5%, PR 0%, HER2 negative, Ki-67 40%     Treatment Plan: based on multidisciplinary tumor board: 1. Neoadjuvant chemotherapy with Taxol weekly 12 with Keytruda  and carboplatin every 3 weeks x4 Adriamycin and Cytoxan, Keytruda dose dense 4 followed by  followed by Beryle Flock maintenance for 1 year 2. Followed by mastectomy with targeted axillary dissection 3. Followed by adjuvant radiation therapy  ---------------------------------------------------------------------------------------------------------------------------   Current Treatment: Cycle 2 Taxol carboplatin and Keytruda started 11/30/2021 Labs reviewed, chemo education completed   CT CAP: Right breast cancer with axillary lymph nodes, mildly enlarged right retropectoral lymph nodes, right internal mammary lymph nodes Breast MRI: Known breast cancer 2 cm, right axillary lymph node, non-mass enhancement 7 cm, borderline enlarged right internal mammary lymph nodes  Chemo Toxicities: Patient could not tolerate 50 mg of Benadryl.  We will decrease  it to 12.5 mg. Consultation due to antinausea medications Neutropenia: Today's ANC is 1.6.  We will set her up for Granix injections tomorrow.   RTC to see me weekly on chemo and every other week for follow up with me  No orders of the defined types were placed in this encounter.  The patient has a good understanding of the overall plan. she agrees with it. she will call with any problems that may develop before the next visit here.  Total time spent: 30 mins including face to face time and time spent for planning, charting and coordination of care  Rulon Eisenmenger, MD, MPH 12/07/2021  I, Thana Ates, am acting as scribe for Dr. Nicholas Lose.  I have reviewed the above documentation for accuracy and completeness, and I agree with the above.

## 2021-12-07 ENCOUNTER — Other Ambulatory Visit: Payer: Self-pay

## 2021-12-07 ENCOUNTER — Encounter: Payer: Self-pay | Admitting: *Deleted

## 2021-12-07 ENCOUNTER — Inpatient Hospital Stay: Payer: Managed Care, Other (non HMO) | Admitting: Hematology and Oncology

## 2021-12-07 ENCOUNTER — Inpatient Hospital Stay: Payer: Managed Care, Other (non HMO)

## 2021-12-07 VITALS — BP 106/59 | HR 73 | Temp 97.5°F | Resp 18 | Ht 67.0 in | Wt 146.3 lb

## 2021-12-07 DIAGNOSIS — Z171 Estrogen receptor negative status [ER-]: Secondary | ICD-10-CM | POA: Diagnosis not present

## 2021-12-07 DIAGNOSIS — C50411 Malignant neoplasm of upper-outer quadrant of right female breast: Secondary | ICD-10-CM | POA: Diagnosis not present

## 2021-12-07 DIAGNOSIS — Z95828 Presence of other vascular implants and grafts: Secondary | ICD-10-CM

## 2021-12-07 DIAGNOSIS — Z5112 Encounter for antineoplastic immunotherapy: Secondary | ICD-10-CM | POA: Diagnosis not present

## 2021-12-07 DIAGNOSIS — R739 Hyperglycemia, unspecified: Secondary | ICD-10-CM

## 2021-12-07 LAB — CMP (CANCER CENTER ONLY)
ALT: 15 U/L (ref 0–44)
AST: 12 U/L — ABNORMAL LOW (ref 15–41)
Albumin: 3.8 g/dL (ref 3.5–5.0)
Alkaline Phosphatase: 43 U/L (ref 38–126)
Anion gap: 7 (ref 5–15)
BUN: 15 mg/dL (ref 6–20)
CO2: 26 mmol/L (ref 22–32)
Calcium: 9 mg/dL (ref 8.9–10.3)
Chloride: 109 mmol/L (ref 98–111)
Creatinine: 0.75 mg/dL (ref 0.44–1.00)
GFR, Estimated: >60 mL/min (ref >60)
Glucose, Bld: 136 mg/dL — ABNORMAL HIGH (ref 70–99)
Potassium: 3.9 mmol/L (ref 3.5–5.1)
Sodium: 142 mmol/L (ref 135–145)
Total Bilirubin: 0.2 mg/dL — ABNORMAL LOW (ref 0.3–1.2)
Total Protein: 6.3 g/dL — ABNORMAL LOW (ref 6.5–8.1)

## 2021-12-07 LAB — CBC WITH DIFFERENTIAL (CANCER CENTER ONLY)
Abs Immature Granulocytes: 0.01 10*3/uL (ref 0.00–0.07)
Basophils Absolute: 0 10*3/uL (ref 0.0–0.1)
Basophils Relative: 1 %
Eosinophils Absolute: 0 10*3/uL (ref 0.0–0.5)
Eosinophils Relative: 2 %
HCT: 32 % — ABNORMAL LOW (ref 36.0–46.0)
Hemoglobin: 10.6 g/dL — ABNORMAL LOW (ref 12.0–15.0)
Immature Granulocytes: 0 %
Lymphocytes Relative: 33 %
Lymphs Abs: 0.9 10*3/uL (ref 0.7–4.0)
MCH: 29.7 pg (ref 26.0–34.0)
MCHC: 33.1 g/dL (ref 30.0–36.0)
MCV: 89.6 fL (ref 80.0–100.0)
Monocytes Absolute: 0.2 10*3/uL (ref 0.1–1.0)
Monocytes Relative: 6 %
Neutro Abs: 1.6 10*3/uL — ABNORMAL LOW (ref 1.7–7.7)
Neutrophils Relative %: 58 %
Platelet Count: 178 10*3/uL (ref 150–400)
RBC: 3.57 MIL/uL — ABNORMAL LOW (ref 3.87–5.11)
RDW: 13.2 % (ref 11.5–15.5)
WBC Count: 2.7 10*3/uL — ABNORMAL LOW (ref 4.0–10.5)
nRBC: 0 % (ref 0.0–0.2)

## 2021-12-07 LAB — TSH: TSH: 1.288 u[IU]/mL (ref 0.308–3.960)

## 2021-12-07 MED ORDER — SODIUM CHLORIDE 0.9 % IV SOLN
80.0000 mg/m2 | Freq: Once | INTRAVENOUS | Status: AC
  Administered 2021-12-07: 144 mg via INTRAVENOUS
  Filled 2021-12-07: qty 24

## 2021-12-07 MED ORDER — SODIUM CHLORIDE 0.9 % IV SOLN: Freq: Once | INTRAVENOUS | Status: AC

## 2021-12-07 MED ORDER — FAMOTIDINE 20 MG IN NS 100 ML IVPB
20.0000 mg | Freq: Once | INTRAVENOUS | Status: AC
  Administered 2021-12-07: 20 mg via INTRAVENOUS
  Filled 2021-12-07: qty 100

## 2021-12-07 MED ORDER — SODIUM CHLORIDE 0.9% FLUSH
10.0000 mL | INTRAVENOUS | Status: DC | PRN
  Administered 2021-12-07: 10 mL

## 2021-12-07 MED ORDER — SODIUM CHLORIDE 0.9 % IV SOLN
10.0000 mg | Freq: Once | INTRAVENOUS | Status: AC
  Administered 2021-12-07: 10 mg via INTRAVENOUS
  Filled 2021-12-07: qty 1
  Filled 2021-12-07: qty 10

## 2021-12-07 MED ORDER — SODIUM CHLORIDE 0.9% FLUSH
10.0000 mL | Freq: Once | INTRAVENOUS | Status: AC
  Administered 2021-12-07: 10 mL

## 2021-12-07 MED ORDER — HEPARIN SOD (PORK) LOCK FLUSH 100 UNIT/ML IV SOLN
500.0000 [IU] | Freq: Once | INTRAVENOUS | Status: AC | PRN
  Administered 2021-12-07: 500 [IU]

## 2021-12-07 MED ORDER — DIPHENHYDRAMINE HCL 50 MG/ML IJ SOLN
12.5000 mg | Freq: Once | INTRAMUSCULAR | Status: AC
  Administered 2021-12-07: 12.5 mg via INTRAVENOUS
  Filled 2021-12-07: qty 1

## 2021-12-07 NOTE — Patient Instructions (Signed)
[facility]ICAL [facility]COLOG[facility]  Discharge Instructions: Thank you for choosing [facility] to provide your oncology and hematology care.   If you have a lab appointment with the Cancer Center, please go directly to the Cancer Center and check in at the registration area.   Wear comfortable clothing and clothing appropriate for easy access to any Portacath or PICC line.   We strive to give you quality time with your provider. You may need to reschedule your appointment if you arrive late (15 or more minutes).  Arriving late affects you and other patients whose appointments are after yours.  Also, if you miss three or more appointments without notifying the office, you may be dismissed from the clinic at the provider's discretion.      For prescription refill requests, have your pharmacy contact our office and allow 72 hours for refills to be completed.    Today you received the following chemotherapy and/or immunotherapy agents taxol      To help prevent nausea and vomiting after your treatment, we encourage you to take your nausea medication as directed.  BELOW ARE SYMPTOMS THAT SHOULD BE REPORTED IMMEDIATELY: *FEVER GREATER THAN 100.4 F (38 C) OR HIGHER *CHILLS OR SWEATING *NAUSEA AND VOMITING THAT IS NOT CONTROLLED WITH YOUR NAUSEA MEDICATION *UNUSUAL SHORTNESS OF BREATH *UNUSUAL BRUISING OR BLEEDING *URINARY PROBLEMS (pain or burning when urinating, or frequent urination) *BOWEL PROBLEMS (unusual diarrhea, constipation, pain near the anus) TENDERNESS IN MOUTH AND THROAT WITH OR WITHOUT PRESENCE OF ULCERS (sore throat, sores in mouth, or a toothache) UNUSUAL RASH, SWELLING OR PAIN  UNUSUAL VAGINAL DISCHARGE OR ITCHING   Items with * indicate a potential emergency and should be followed up as soon as possible or go to the Emergency Department if any problems should occur.  Please show the CHEMOTHERAPY ALERT CARD or IMMUNOTHERAPY ALERT CARD at check-in to the  Emergency Department and triage nurse.  Should you have questions after your visit or need to cancel or reschedule your appointment, please contact [facility]  Dept: [phone]  and follow the prompts.  Office hours are 8:00 a.m. to 4:30 p.m. Monday - Friday. Please note that voicemails left after 4:00 p.m. may not be returned until the following business day.  We are closed weekends and major holidays. You have access to a nurse at all times for urgent questions. Please call the main number to the clinic Dept: [phone] and follow the prompts.   For any non-urgent questions, you may also contact your provider using MyChart. We now offer e-Visits for anyone 18 and older to request care online for non-urgent symptoms. For details visit [email or web address].   Also download the MyChart app! Go to the app store, search "MyChart", open the app, select [place], and log in with your MyChart username and password.  Due to Covid, a mask is required upon entering the hospital/clinic. If you do not have a mask, one will be given to you upon arrival. For doctor visits, patients may have 1 support person aged 18 or older with them. For treatment visits, patients cannot have anyone with them due to current Covid guidelines and our immunocompromised population.   

## 2021-12-08 ENCOUNTER — Inpatient Hospital Stay: Payer: Managed Care, Other (non HMO)

## 2021-12-08 VITALS — BP 104/55 | HR 72 | Temp 98.0°F | Resp 16

## 2021-12-08 DIAGNOSIS — C50411 Malignant neoplasm of upper-outer quadrant of right female breast: Secondary | ICD-10-CM

## 2021-12-08 DIAGNOSIS — Z5112 Encounter for antineoplastic immunotherapy: Secondary | ICD-10-CM | POA: Diagnosis not present

## 2021-12-08 LAB — T4: T4, Total: 5.1 ug/dL (ref 4.5–12.0)

## 2021-12-08 MED ORDER — FILGRASTIM-AAFI 480 MCG/0.8ML IJ SOSY
480.0000 ug | PREFILLED_SYRINGE | Freq: Once | INTRAMUSCULAR | Status: AC
  Administered 2021-12-08: 480 ug via SUBCUTANEOUS
  Filled 2021-12-08: qty 0.8

## 2021-12-08 NOTE — Patient Instructions (Signed)
Filgrastim, G-CSF injection °What is this medication? °FILGRASTIM, G-CSF (fil GRA stim) is a granulocyte colony-stimulating factor that stimulates the growth of neutrophils, a type of white blood cell (WBC) important in the body's fight against infection. It is used to reduce the incidence of fever and infection in patients with certain types of cancer who are receiving chemotherapy that affects the bone marrow, to stimulate blood cell production for removal of WBCs from the body prior to a bone marrow transplantation, to reduce the incidence of fever and infection in patients who have severe chronic neutropenia, and to improve survival outcomes following high-dose radiation exposure that is toxic to the bone marrow. °This medicine may be used for other purposes; ask your health care provider or pharmacist if you have questions. °COMMON BRAND NAME(S): Neupogen, Nivestym, Releuko, Zarxio °What should I tell my care team before I take this medication? °They need to know if you have any of these conditions: °kidney disease °latex allergy °ongoing radiation therapy °sickle cell disease °an unusual or allergic reaction to filgrastim, pegfilgrastim, other medicines, foods, dyes, or preservatives °pregnant or trying to get pregnant °breast-feeding °How should I use this medication? °This medicine is for injection under the skin or infusion into a vein. As an infusion into a vein, it is usually given by a health care professional in a hospital or clinic setting. If you get this medicine at home, you will be taught how to prepare and give this medicine. Refer to the Instructions for Use that come with your medication packaging. Use exactly as directed. Take your medicine at regular intervals. Do not take your medicine more often than directed. °It is important that you put your used needles and syringes in a special sharps container. Do not put them in a trash can. If you do not have a sharps container, call your pharmacist  or healthcare provider to get one. °Talk to your pediatrician regarding the use of this medicine in children. While this drug may be prescribed for children as young as 7 months for selected conditions, precautions do apply. °Overdosage: If you think you have taken too much of this medicine contact a poison control center or emergency room at once. °NOTE: This medicine is only for you. Do not share this medicine with others. °What if I miss a dose? °It is important not to miss your dose. Call your doctor or health care professional if you miss a dose. °What may interact with this medication? °This medicine may interact with the following medications: °medicines that may cause a release of neutrophils, such as lithium °This list may not describe all possible interactions. Give your health care provider a list of all the medicines, herbs, non-prescription drugs, or dietary supplements you use. Also tell them if you smoke, drink alcohol, or use illegal drugs. Some items may interact with your medicine. °What should I watch for while using this medication? °Your condition will be monitored carefully while you are receiving this medicine. °You may need blood work done while you are taking this medicine. °Talk to your health care provider about your risk of cancer. You may be more at risk for certain types of cancer if you take this medicine. °What side effects may I notice from receiving this medication? °Side effects that you should report to your doctor or health care professional as soon as possible: °allergic reactions like skin rash, itching or hives, swelling of the face, lips, or tongue °back pain °dizziness or feeling faint °fever °pain, redness, or   irritation at site where injected °pinpoint red spots on the skin °shortness of breath or breathing problems °signs and symptoms of kidney injury like trouble passing urine, change in the amount of urine, or red or dark-brown urine °stomach or side pain, or pain at  the shoulder °swelling °tiredness °unusual bleeding or bruising °Side effects that usually do not require medical attention (report to your doctor or health care professional if they continue or are bothersome): °bone pain °cough °diarrhea °hair loss °headache °muscle pain °This list may not describe all possible side effects. Call your doctor for medical advice about side effects. You may report side effects to FDA at 1-800-FDA-1088. °Where should I keep my medication? °Keep out of the reach of children. °Store in a refrigerator between 2 and 8 degrees C (36 and 46 degrees F). Do not freeze. Keep in carton to protect from light. Throw away this medicine if vials or syringes are left out of the refrigerator for more than 24 hours. Throw away any unused medicine after the expiration date. °NOTE: This sheet is a summary. It may not cover all possible information. If you have questions about this medicine, talk to your doctor, pharmacist, or health care provider. °© 2022 [employer] (2021-09-01 00:00:00) ° °

## 2021-12-09 ENCOUNTER — Inpatient Hospital Stay: Payer: Managed Care, Other (non HMO)

## 2021-12-10 ENCOUNTER — Ambulatory Visit: Payer: Managed Care, Other (non HMO)

## 2021-12-11 MED FILL — Dexamethasone Sodium Phosphate Inj 100 MG/10ML: INTRAMUSCULAR | Qty: 1 | Status: AC

## 2021-12-12 ENCOUNTER — Other Ambulatory Visit: Payer: Self-pay

## 2021-12-14 ENCOUNTER — Inpatient Hospital Stay: Payer: Managed Care, Other (non HMO)

## 2021-12-14 ENCOUNTER — Other Ambulatory Visit: Payer: Self-pay

## 2021-12-14 ENCOUNTER — Other Ambulatory Visit: Payer: Self-pay | Admitting: Hematology and Oncology

## 2021-12-14 VITALS — BP 108/59 | HR 63 | Temp 98.3°F | Resp 18 | Wt 151.0 lb

## 2021-12-14 DIAGNOSIS — Z95828 Presence of other vascular implants and grafts: Secondary | ICD-10-CM

## 2021-12-14 DIAGNOSIS — R739 Hyperglycemia, unspecified: Secondary | ICD-10-CM

## 2021-12-14 DIAGNOSIS — Z5112 Encounter for antineoplastic immunotherapy: Secondary | ICD-10-CM | POA: Diagnosis not present

## 2021-12-14 DIAGNOSIS — C50411 Malignant neoplasm of upper-outer quadrant of right female breast: Secondary | ICD-10-CM

## 2021-12-14 DIAGNOSIS — Z171 Estrogen receptor negative status [ER-]: Secondary | ICD-10-CM

## 2021-12-14 LAB — CBC WITH DIFFERENTIAL (CANCER CENTER ONLY)
Abs Immature Granulocytes: 0.12 10*3/uL — ABNORMAL HIGH (ref 0.00–0.07)
Basophils Absolute: 0 10*3/uL (ref 0.0–0.1)
Basophils Relative: 1 %
Eosinophils Absolute: 0 10*3/uL (ref 0.0–0.5)
Eosinophils Relative: 1 %
HCT: 30.4 % — ABNORMAL LOW (ref 36.0–46.0)
Hemoglobin: 10 g/dL — ABNORMAL LOW (ref 12.0–15.0)
Immature Granulocytes: 5 %
Lymphocytes Relative: 42 %
Lymphs Abs: 1.1 10*3/uL (ref 0.7–4.0)
MCH: 29.4 pg (ref 26.0–34.0)
MCHC: 32.9 g/dL (ref 30.0–36.0)
MCV: 89.4 fL (ref 80.0–100.0)
Monocytes Absolute: 0.3 10*3/uL (ref 0.1–1.0)
Monocytes Relative: 12 %
Neutro Abs: 1 10*3/uL — ABNORMAL LOW (ref 1.7–7.7)
Neutrophils Relative %: 39 %
Platelet Count: 178 10*3/uL (ref 150–400)
RBC: 3.4 MIL/uL — ABNORMAL LOW (ref 3.87–5.11)
RDW: 13.8 % (ref 11.5–15.5)
WBC Count: 2.5 10*3/uL — ABNORMAL LOW (ref 4.0–10.5)
nRBC: 0 % (ref 0.0–0.2)

## 2021-12-14 LAB — CMP (CANCER CENTER ONLY)
ALT: 15 U/L (ref 0–44)
AST: 14 U/L — ABNORMAL LOW (ref 15–41)
Albumin: 3.8 g/dL (ref 3.5–5.0)
Alkaline Phosphatase: 44 U/L (ref 38–126)
Anion gap: 5 (ref 5–15)
BUN: 9 mg/dL (ref 6–20)
CO2: 24 mmol/L (ref 22–32)
Calcium: 8.8 mg/dL — ABNORMAL LOW (ref 8.9–10.3)
Chloride: 112 mmol/L — ABNORMAL HIGH (ref 98–111)
Creatinine: 0.65 mg/dL (ref 0.44–1.00)
GFR, Estimated: >60 mL/min (ref >60)
Glucose, Bld: 101 mg/dL — ABNORMAL HIGH (ref 70–99)
Potassium: 4.1 mmol/L (ref 3.5–5.1)
Sodium: 141 mmol/L (ref 135–145)
Total Bilirubin: <0.2 mg/dL — ABNORMAL LOW (ref 0.3–1.2)
Total Protein: 6.2 g/dL — ABNORMAL LOW (ref 6.5–8.1)

## 2021-12-14 LAB — TSH: TSH: 1.494 u[IU]/mL (ref 0.308–3.960)

## 2021-12-14 LAB — HEMOGLOBIN A1C
Hgb A1c MFr Bld: 6.4 % — ABNORMAL HIGH (ref 4.8–5.6)
Mean Plasma Glucose: 136.98 mg/dL

## 2021-12-14 MED ORDER — DEXAMETHASONE SODIUM PHOSPHATE 100 MG/10ML IJ SOLN
10.0000 mg | Freq: Once | INTRAMUSCULAR | Status: AC
  Administered 2021-12-14: 13:00:00 10 mg via INTRAVENOUS
  Filled 2021-12-14: qty 10

## 2021-12-14 MED ORDER — SODIUM CHLORIDE 0.9% FLUSH
10.0000 mL | Freq: Once | INTRAVENOUS | Status: AC
  Administered 2021-12-14: 11:00:00 10 mL

## 2021-12-14 MED ORDER — FAMOTIDINE 20 MG IN NS 100 ML IVPB
20.0000 mg | Freq: Once | INTRAVENOUS | Status: AC
  Administered 2021-12-14: 13:00:00 20 mg via INTRAVENOUS
  Filled 2021-12-14: qty 100

## 2021-12-14 MED ORDER — HEPARIN SOD (PORK) LOCK FLUSH 100 UNIT/ML IV SOLN: 500.0000 [IU] | Freq: Once | INTRAVENOUS | Status: DC | PRN

## 2021-12-14 MED ORDER — SODIUM CHLORIDE 0.9% FLUSH: 10.0000 mL | INTRAVENOUS | Status: DC | PRN

## 2021-12-14 MED ORDER — SODIUM CHLORIDE 0.9 % IV SOLN
60.0000 mg/m2 | Freq: Once | INTRAVENOUS | Status: AC
  Administered 2021-12-14: 14:00:00 108 mg via INTRAVENOUS
  Filled 2021-12-14: qty 18

## 2021-12-14 MED ORDER — SODIUM CHLORIDE 0.9 % IV SOLN
Freq: Once | INTRAVENOUS | Status: AC
Start: 2021-12-14 — End: 2021-12-14

## 2021-12-14 MED ORDER — DIPHENHYDRAMINE HCL 50 MG/ML IJ SOLN
12.5000 mg | Freq: Once | INTRAMUSCULAR | Status: AC
  Administered 2021-12-14: 13:00:00 12.5 mg via INTRAVENOUS
  Filled 2021-12-14: qty 1

## 2021-12-14 NOTE — Patient Instructions (Signed)
[facility]  Discharge Instructions: Thank you for choosing [facility] to provide your oncology and hematology care.   If you have a lab appointment with the [facility], please go directly to the [facility] and check in at the registration area.   Wear comfortable clothing and clothing appropriate for easy access to any Portacath or PICC line.   We strive to give you quality time with your provider. You may need to reschedule your appointment if you arrive late (15 or more minutes).  Arriving late affects you and other patients whose appointments are after yours.  Also, if you miss three or more appointments without notifying the office, you may be dismissed from the clinic at the providers discretion.      For prescription refill requests, have your pharmacy contact our office and allow 72 hours for refills to be completed.    Today you received the following chemotherapy and/or immunotherapy agents: Paclitaxel      To help prevent nausea and vomiting after your treatment, we encourage you to take your nausea medication as directed.  BELOW ARE SYMPTOMS THAT SHOULD BE REPORTED IMMEDIATELY: *FEVER GREATER THAN 100.4 F (38 C) OR HIGHER *CHILLS OR SWEATING *NAUSEA AND VOMITING THAT IS NOT CONTROLLED WITH YOUR NAUSEA MEDICATION *UNUSUAL SHORTNESS OF BREATH *UNUSUAL BRUISING OR BLEEDING *URINARY PROBLEMS (pain or burning when urinating, or frequent urination) *BOWEL PROBLEMS (unusual diarrhea, constipation, pain near the anus) TENDERNESS IN MOUTH AND THROAT WITH OR WITHOUT PRESENCE OF ULCERS (sore throat, sores in mouth, or a toothache) UNUSUAL RASH, SWELLING OR PAIN  UNUSUAL VAGINAL DISCHARGE OR ITCHING   Items with * indicate a potential emergency and should be followed up as soon as possible or go to the Emergency Department if any problems should occur.  Please show the CHEMOTHERAPY ALERT CARD or IMMUNOTHERAPY ALERT CARD at check-in to  the Emergency Department and triage nurse.  Should you have questions after your visit or need to cancel or reschedule your appointment, please contact [facility]  Dept: [phone]  and follow the prompts.  Office hours are 8:00 a.m. to 4:30 p.m. Monday - Friday. Please note that voicemails left after 4:00 p.m. may not be returned until the following business day.  We are closed weekends and major holidays. You have access to a nurse at all times for urgent questions. Please call the main number to the clinic Dept: [phone] and follow the prompts.   For any non-urgent questions, you may also contact your provider using MyChart. We now offer e-Visits for anyone 78 and older to request care online for non-urgent symptoms. For details visit [email or web address].   Also download the MyChart app! Go to the app store, search "MyChart", open the app, select [facility], and log in with your MyChart username and password.  Due to Covid, a mask is required upon entering the hospital/clinic. If you do not have a mask, one will be given to you upon arrival. For doctor visits, patients may have 1 support person aged 71 or older with them. For treatment visits, patients cannot have anyone with them due to current Covid guidelines and our immunocompromised population.

## 2021-12-14 NOTE — Progress Notes (Signed)
Per Dr. Lindi Adie, ok to proceed with Taxol with ANC 1.0.

## 2021-12-15 ENCOUNTER — Inpatient Hospital Stay: Payer: Managed Care, Other (non HMO)

## 2021-12-15 ENCOUNTER — Encounter: Payer: Self-pay | Admitting: Hematology and Oncology

## 2021-12-15 VITALS — BP 121/42 | HR 69 | Temp 98.7°F | Resp 18

## 2021-12-15 DIAGNOSIS — Z5112 Encounter for antineoplastic immunotherapy: Secondary | ICD-10-CM | POA: Diagnosis not present

## 2021-12-15 DIAGNOSIS — Z171 Estrogen receptor negative status [ER-]: Secondary | ICD-10-CM

## 2021-12-15 LAB — T4: T4, Total: 6.2 ug/dL (ref 4.5–12.0)

## 2021-12-15 MED ORDER — FILGRASTIM-AAFI 480 MCG/0.8ML IJ SOSY
480.0000 ug | PREFILLED_SYRINGE | Freq: Once | INTRAMUSCULAR | Status: AC
  Administered 2021-12-15: 15:00:00 480 ug via SUBCUTANEOUS
  Filled 2021-12-15: qty 0.8

## 2021-12-22 ENCOUNTER — Inpatient Hospital Stay: Payer: Managed Care, Other (non HMO)

## 2021-12-22 ENCOUNTER — Other Ambulatory Visit: Payer: Self-pay

## 2021-12-22 VITALS — BP 110/56 | HR 83 | Temp 98.3°F | Resp 17 | Wt 148.0 lb

## 2021-12-22 DIAGNOSIS — Z95828 Presence of other vascular implants and grafts: Secondary | ICD-10-CM

## 2021-12-22 DIAGNOSIS — C50411 Malignant neoplasm of upper-outer quadrant of right female breast: Secondary | ICD-10-CM

## 2021-12-22 DIAGNOSIS — Z5112 Encounter for antineoplastic immunotherapy: Secondary | ICD-10-CM | POA: Diagnosis not present

## 2021-12-22 LAB — CBC WITH DIFFERENTIAL (CANCER CENTER ONLY)
Abs Immature Granulocytes: 0.01 10*3/uL (ref 0.00–0.07)
Basophils Absolute: 0 10*3/uL (ref 0.0–0.1)
Basophils Relative: 1 %
Eosinophils Absolute: 0 10*3/uL (ref 0.0–0.5)
Eosinophils Relative: 0 %
HCT: 31.3 % — ABNORMAL LOW (ref 36.0–46.0)
Hemoglobin: 10.2 g/dL — ABNORMAL LOW (ref 12.0–15.0)
Immature Granulocytes: 0 %
Lymphocytes Relative: 32 %
Lymphs Abs: 0.8 10*3/uL (ref 0.7–4.0)
MCH: 29.6 pg (ref 26.0–34.0)
MCHC: 32.6 g/dL (ref 30.0–36.0)
MCV: 90.7 fL (ref 80.0–100.0)
Monocytes Absolute: 0.2 10*3/uL (ref 0.1–1.0)
Monocytes Relative: 8 %
Neutro Abs: 1.5 10*3/uL — ABNORMAL LOW (ref 1.7–7.7)
Neutrophils Relative %: 59 %
Platelet Count: 175 10*3/uL (ref 150–400)
RBC: 3.45 MIL/uL — ABNORMAL LOW (ref 3.87–5.11)
RDW: 15 % (ref 11.5–15.5)
WBC Count: 2.6 10*3/uL — ABNORMAL LOW (ref 4.0–10.5)
nRBC: 0 % (ref 0.0–0.2)

## 2021-12-22 LAB — CMP (CANCER CENTER ONLY)
ALT: 18 U/L (ref 0–44)
AST: 13 U/L — ABNORMAL LOW (ref 15–41)
Albumin: 4.1 g/dL (ref 3.5–5.0)
Alkaline Phosphatase: 46 U/L (ref 38–126)
Anion gap: 4 — ABNORMAL LOW (ref 5–15)
BUN: 12 mg/dL (ref 6–20)
CO2: 27 mmol/L (ref 22–32)
Calcium: 9.5 mg/dL (ref 8.9–10.3)
Chloride: 110 mmol/L (ref 98–111)
Creatinine: 0.7 mg/dL (ref 0.44–1.00)
GFR, Estimated: >60 mL/min (ref >60)
Glucose, Bld: 127 mg/dL — ABNORMAL HIGH (ref 70–99)
Potassium: 4 mmol/L (ref 3.5–5.1)
Sodium: 141 mmol/L (ref 135–145)
Total Bilirubin: 0.3 mg/dL (ref 0.3–1.2)
Total Protein: 6.4 g/dL — ABNORMAL LOW (ref 6.5–8.1)

## 2021-12-22 LAB — TSH: TSH: <0.08 u[IU]/mL — ABNORMAL LOW (ref 0.308–3.960)

## 2021-12-22 MED ORDER — HEPARIN SOD (PORK) LOCK FLUSH 100 UNIT/ML IV SOLN
500.0000 [IU] | Freq: Once | INTRAVENOUS | Status: AC | PRN
  Administered 2021-12-22: 18:00:00 500 [IU]

## 2021-12-22 MED ORDER — SODIUM CHLORIDE 0.9 % IV SOLN
600.0000 mg | Freq: Once | INTRAVENOUS | Status: AC
  Administered 2021-12-22: 18:00:00 600 mg via INTRAVENOUS
  Filled 2021-12-22: qty 60

## 2021-12-22 MED ORDER — PALONOSETRON HCL INJECTION 0.25 MG/5ML
0.2500 mg | Freq: Once | INTRAVENOUS | Status: AC
  Administered 2021-12-22: 14:00:00 0.25 mg via INTRAVENOUS
  Filled 2021-12-22: qty 5

## 2021-12-22 MED ORDER — SODIUM CHLORIDE 0.9 % IV SOLN: Freq: Once | INTRAVENOUS | Status: AC

## 2021-12-22 MED ORDER — SODIUM CHLORIDE 0.9% FLUSH
10.0000 mL | Freq: Once | INTRAVENOUS | Status: AC
  Administered 2021-12-22: 13:00:00 10 mL

## 2021-12-22 MED ORDER — FAMOTIDINE 20 MG IN NS 100 ML IVPB
20.0000 mg | Freq: Once | INTRAVENOUS | Status: AC
  Administered 2021-12-22: 15:00:00 20 mg via INTRAVENOUS
  Filled 2021-12-22: qty 100

## 2021-12-22 MED ORDER — SODIUM CHLORIDE 0.9% FLUSH
10.0000 mL | INTRAVENOUS | Status: DC | PRN
  Administered 2021-12-22: 18:00:00 10 mL

## 2021-12-22 MED ORDER — SODIUM CHLORIDE 0.9 % IV SOLN
10.0000 mg | Freq: Once | INTRAVENOUS | Status: AC
  Administered 2021-12-22: 15:00:00 10 mg via INTRAVENOUS
  Filled 2021-12-22: qty 10

## 2021-12-22 MED ORDER — DIPHENHYDRAMINE HCL 50 MG/ML IJ SOLN
12.5000 mg | Freq: Once | INTRAMUSCULAR | Status: AC
  Administered 2021-12-22: 15:00:00 12.5 mg via INTRAVENOUS
  Filled 2021-12-22: qty 1

## 2021-12-22 MED ORDER — SODIUM CHLORIDE 0.9 % IV SOLN
60.0000 mg/m2 | Freq: Once | INTRAVENOUS | Status: AC
  Administered 2021-12-22: 17:00:00 108 mg via INTRAVENOUS
  Filled 2021-12-22: qty 18

## 2021-12-22 MED ORDER — SODIUM CHLORIDE 0.9 % IV SOLN
150.0000 mg | Freq: Once | INTRAVENOUS | Status: AC
  Administered 2021-12-22: 15:00:00 150 mg via INTRAVENOUS
  Filled 2021-12-22: qty 150

## 2021-12-22 MED ORDER — SODIUM CHLORIDE 0.9 % IV SOLN
200.0000 mg | Freq: Once | INTRAVENOUS | Status: AC
  Administered 2021-12-22: 16:00:00 200 mg via INTRAVENOUS
  Filled 2021-12-22: qty 8

## 2021-12-23 ENCOUNTER — Encounter: Payer: Self-pay | Admitting: Physician Assistant

## 2021-12-23 ENCOUNTER — Other Ambulatory Visit: Payer: Self-pay | Admitting: *Deleted

## 2021-12-23 ENCOUNTER — Inpatient Hospital Stay: Payer: Managed Care, Other (non HMO)

## 2021-12-23 ENCOUNTER — Telehealth: Payer: Self-pay | Admitting: *Deleted

## 2021-12-23 DIAGNOSIS — Z171 Estrogen receptor negative status [ER-]: Secondary | ICD-10-CM

## 2021-12-23 DIAGNOSIS — E039 Hypothyroidism, unspecified: Secondary | ICD-10-CM

## 2021-12-23 LAB — T4: T4, Total: 11.6 ug/dL (ref 4.5–12.0)

## 2021-12-23 NOTE — Telephone Encounter (Signed)
This RN spoke with pt per her call stating concerns and questions " and I think I need to be seen today"  Pt had C2 of taxol/carbo and keytruda yesterday " and my thyroid is low - and I now have a nodule on the left side of my neck the size of a walnut "  Pt has known hypothyroidism and is on Armour natural desiccated thyroid replacement - prescribed by her primary MD Dr Horald Pollen.  She states overall she feels fine.  She had several other questions regarding her lab that this RN was able to address-  Low wbc and heme- " do you give transfusions for these ?" This RN informed her wbc is managed per injections to booster- for heme- usually transfuse if heme drops to 8 - but discussed noted drop from 13.5 to 10.2 and issues of fatigue.  2. No period this month ( last one 33 days ago ) pregnancy not an issue per " husband has had vascectomy "  This RN addressed above issue is likely caused by chemotherapy.  3. 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 This RN discussed above including benefit of wearing a mask- but if she feels she is being compromised MD can provide a note regarding need to limit exposure.  This RN informed her need for thyroid issue will be addressed and call returned to her.  Per review with covering MD- she states need to verify pt is not taking Biotin( due to interaction ) - need to recheck labs due noted low TSH with normal T4.  This RN spoke with pt per above and will add lab to injection appt as well add to [facility] provider.  Verified with pt she is not taking biotin supplements ( per possible interaction ) but she is receiving IV vitamin infusions of Vitamin C and " Meyer's Cocktail ".  She states her thyroid medication as NP Thyroid 30 mg prescribed per [employer].

## 2021-12-24 ENCOUNTER — Inpatient Hospital Stay: Payer: Managed Care, Other (non HMO)

## 2021-12-24 ENCOUNTER — Other Ambulatory Visit: Payer: Self-pay | Admitting: Adult Health

## 2021-12-24 ENCOUNTER — Other Ambulatory Visit: Payer: Self-pay

## 2021-12-24 ENCOUNTER — Inpatient Hospital Stay (HOSPITAL_BASED_OUTPATIENT_CLINIC_OR_DEPARTMENT_OTHER): Payer: Managed Care, Other (non HMO) | Admitting: Physician Assistant

## 2021-12-24 VITALS — BP 109/47 | HR 89 | Temp 98.1°F | Resp 18 | Ht 67.0 in | Wt 149.5 lb

## 2021-12-24 DIAGNOSIS — Z5112 Encounter for antineoplastic immunotherapy: Secondary | ICD-10-CM | POA: Diagnosis not present

## 2021-12-24 DIAGNOSIS — Z95828 Presence of other vascular implants and grafts: Secondary | ICD-10-CM

## 2021-12-24 DIAGNOSIS — Z171 Estrogen receptor negative status [ER-]: Secondary | ICD-10-CM

## 2021-12-24 DIAGNOSIS — C50411 Malignant neoplasm of upper-outer quadrant of right female breast: Secondary | ICD-10-CM

## 2021-12-24 DIAGNOSIS — D649 Anemia, unspecified: Secondary | ICD-10-CM | POA: Diagnosis not present

## 2021-12-24 DIAGNOSIS — E039 Hypothyroidism, unspecified: Secondary | ICD-10-CM | POA: Diagnosis not present

## 2021-12-24 LAB — CMP (CANCER CENTER ONLY)
ALT: 35 U/L (ref 0–44)
AST: 17 U/L (ref 15–41)
Albumin: 3.9 g/dL (ref 3.5–5.0)
Alkaline Phosphatase: 38 U/L (ref 38–126)
Anion gap: 5 (ref 5–15)
BUN: 17 mg/dL (ref 6–20)
CO2: 27 mmol/L (ref 22–32)
Calcium: 9.3 mg/dL (ref 8.9–10.3)
Chloride: 109 mmol/L (ref 98–111)
Creatinine: 0.57 mg/dL (ref 0.44–1.00)
GFR, Estimated: >60 mL/min (ref >60)
Glucose, Bld: 111 mg/dL — ABNORMAL HIGH (ref 70–99)
Potassium: 3.9 mmol/L (ref 3.5–5.1)
Sodium: 141 mmol/L (ref 135–145)
Total Bilirubin: 0.8 mg/dL (ref 0.3–1.2)
Total Protein: 6.3 g/dL — ABNORMAL LOW (ref 6.5–8.1)

## 2021-12-24 LAB — CBC WITH DIFFERENTIAL (CANCER CENTER ONLY)
Abs Immature Granulocytes: 0.01 10*3/uL (ref 0.00–0.07)
Basophils Absolute: 0 10*3/uL (ref 0.0–0.1)
Basophils Relative: 0 %
Eosinophils Absolute: 0 10*3/uL (ref 0.0–0.5)
Eosinophils Relative: 0 %
HCT: 29.2 % — ABNORMAL LOW (ref 36.0–46.0)
Hemoglobin: 9.5 g/dL — ABNORMAL LOW (ref 12.0–15.0)
Immature Granulocytes: 0 %
Lymphocytes Relative: 28 %
Lymphs Abs: 0.9 10*3/uL (ref 0.7–4.0)
MCH: 29.7 pg (ref 26.0–34.0)
MCHC: 32.5 g/dL (ref 30.0–36.0)
MCV: 91.3 fL (ref 80.0–100.0)
Monocytes Absolute: 0.2 10*3/uL (ref 0.1–1.0)
Monocytes Relative: 8 %
Neutro Abs: 2 10*3/uL (ref 1.7–7.7)
Neutrophils Relative %: 64 %
Platelet Count: 181 10*3/uL (ref 150–400)
RBC: 3.2 MIL/uL — ABNORMAL LOW (ref 3.87–5.11)
RDW: 15.5 % (ref 11.5–15.5)
WBC Count: 3.1 10*3/uL — ABNORMAL LOW (ref 4.0–10.5)
nRBC: 0 % (ref 0.0–0.2)

## 2021-12-24 LAB — TSH: TSH: <0.08 u[IU]/mL — ABNORMAL LOW (ref 0.308–3.960)

## 2021-12-24 MED ORDER — FILGRASTIM-AAFI 480 MCG/0.8ML IJ SOSY
480.0000 ug | PREFILLED_SYRINGE | Freq: Once | INTRAMUSCULAR | Status: AC
  Administered 2021-12-24: 11:00:00 480 ug via SUBCUTANEOUS

## 2021-12-24 MED ORDER — HEPARIN SOD (PORK) LOCK FLUSH 100 UNIT/ML IV SOLN
500.0000 [IU] | Freq: Once | INTRAVENOUS | Status: AC
  Administered 2021-12-24: 12:00:00 500 [IU]

## 2021-12-24 MED ORDER — SODIUM CHLORIDE 0.9% FLUSH
10.0000 mL | Freq: Once | INTRAVENOUS | Status: AC
  Administered 2021-12-24: 10:00:00 10 mL

## 2021-12-24 NOTE — Patient Instructions (Signed)
Filgrastim, G-CSF injection °What is this medication? °FILGRASTIM, G-CSF (fil GRA stim) is a granulocyte colony-stimulating factor that stimulates the growth of neutrophils, a type of white blood cell (WBC) important in the body's fight against infection. It is used to reduce the incidence of fever and infection in patients with certain types of cancer who are receiving chemotherapy that affects the bone marrow, to stimulate blood cell production for removal of WBCs from the body prior to a bone marrow transplantation, to reduce the incidence of fever and infection in patients who have severe chronic neutropenia, and to improve survival outcomes following high-dose radiation exposure that is toxic to the bone marrow. °This medicine may be used for other purposes; ask your health care provider or pharmacist if you have questions. °COMMON BRAND NAME(S): Neupogen, Nivestym, Releuko, Zarxio °What should I tell my care team before I take this medication? °They need to know if you have any of these conditions: °kidney disease °latex allergy °ongoing radiation therapy °sickle cell disease °an unusual or allergic reaction to filgrastim, pegfilgrastim, other medicines, foods, dyes, or preservatives °pregnant or trying to get pregnant °breast-feeding °How should I use this medication? °This medicine is for injection under the skin or infusion into a vein. As an infusion into a vein, it is usually given by a health care professional in a hospital or clinic setting. If you get this medicine at home, you will be taught how to prepare and give this medicine. Refer to the Instructions for Use that come with your medication packaging. Use exactly as directed. Take your medicine at regular intervals. Do not take your medicine more often than directed. °It is important that you put your used needles and syringes in a special sharps container. Do not put them in a trash can. If you do not have a sharps container, call your pharmacist  or healthcare provider to get one. °Talk to your pediatrician regarding the use of this medicine in children. While this drug may be prescribed for children as young as 7 months for selected conditions, precautions do apply. °Overdosage: If you think you have taken too much of this medicine contact a poison control center or emergency room at once. °NOTE: This medicine is only for you. Do not share this medicine with others. °What if I miss a dose? °It is important not to miss your dose. Call your doctor or health care professional if you miss a dose. °What may interact with this medication? °This medicine may interact with the following medications: °medicines that may cause a release of neutrophils, such as lithium °This list may not describe all possible interactions. Give your health care provider a list of all the medicines, herbs, non-prescription drugs, or dietary supplements you use. Also tell them if you smoke, drink alcohol, or use illegal drugs. Some items may interact with your medicine. °What should I watch for while using this medication? °Your condition will be monitored carefully while you are receiving this medicine. °You may need blood work done while you are taking this medicine. °Talk to your health care provider about your risk of cancer. You may be more at risk for certain types of cancer if you take this medicine. °What side effects may I notice from receiving this medication? °Side effects that you should report to your doctor or health care professional as soon as possible: °allergic reactions like skin rash, itching or hives, swelling of the face, lips, or tongue °back pain °dizziness or feeling faint °fever °pain, redness, or   irritation at site where injected °pinpoint red spots on the skin °shortness of breath or breathing problems °signs and symptoms of kidney injury like trouble passing urine, change in the amount of urine, or red or dark-brown urine °stomach or side pain, or pain at  the shoulder °swelling °tiredness °unusual bleeding or bruising °Side effects that usually do not require medical attention (report to your doctor or health care professional if they continue or are bothersome): °bone pain °cough °diarrhea °hair loss °headache °muscle pain °This list may not describe all possible side effects. Call your doctor for medical advice about side effects. You may report side effects to FDA at 1-800-FDA-1088. °Where should I keep my medication? °Keep out of the reach of children. °Store in a refrigerator between 2 and 8 degrees C (36 and 46 degrees F). Do not freeze. Keep in carton to protect from light. Throw away this medicine if vials or syringes are left out of the refrigerator for more than 24 hours. Throw away any unused medicine after the expiration date. °NOTE: This sheet is a summary. It may not cover all possible information. If you have questions about this medicine, talk to your doctor, pharmacist, or health care provider. °© 2022 [employer] (2021-09-01 00:00:00) ° °

## 2021-12-24 NOTE — Progress Notes (Signed)
Symptom Management Consult note Sydney Rios    Patient Care Team: Sydney Rasmussen, MD as PCP - General (Family Medicine)    Name of the patient: Sydney Rios  [id number]  10/01/1981   Date of visit: 12/24/2021    Chief complaint/ Reason for visit- thyroid swelling  Oncology History  Malignant neoplasm of upper-outer quadrant of right breast in female, estrogen receptor negative (Sydney Rios)  11/04/2021 Initial Diagnosis    Patient has been breast-feeding.  Felt a painful right breast mass and an axillary mass.  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 with a Ki-67 of 40%, 1 abnormal lymph node 5.8 cm biopsy: Grade 3 IDC ER 5%, PR 0%, HER2 negative, Ki-67 40%   11/11/2021 Cancer Staging   Staging form: Breast, AJCC 8th Edition - Clinical stage from 11/11/2021: Stage IIB (cT2, cN1, cM0, G3, ER+, PR+, HER2-) - Signed by Sydney Lose, MD on 11/11/2021 Stage prefix: Initial diagnosis Histologic grading system: 3 grade system    11/30/2021 -  Chemotherapy   Patient is on Treatment Plan : BREAST Pembrolizumab + Carboplatin D1 + Paclitaxel D1,8,15 q21d X 4 cycles / Pembrolizumab + AC q21d x 4 cycles     11/30/2021 Genetic Testing   Negative genetic testing on the CancerNext-Expanded+RNA insight panel.  The report date is 11/30/2021  The CancerNext-Expanded gene panel offered by [facility] and includes sequencing and rearrangement analysis for the following 77 genes: AIP, ALK, APC*, ATM*, AXIN2, BAP1, BARD1, BLM, BMPR1A, BRCA1*, BRCA2*, BRIP1*, CDC73, CDH1*, CDK4, CDKN1B, CDKN2A, CHEK2*, CTNNA1, DICER1, FANCC, FH, FLCN, GALNT12, KIF1B, LZTR1, MAX, MEN1, MET, MLH1*, MSH2*, MSH3, MSH6*, MUTYH*, NBN, NF1*, NF2, NTHL1, PALB2*, PHOX2B, PMS2*, POT1, PRKAR1A, PTCH1, PTEN*, RAD51C*, RAD51D*, RB1, RECQL, RET, SDHA, SDHAF2, SDHB, SDHC, SDHD, SMAD4, SMARCA4, SMARCB1, SMARCE1, STK11, SUFU, TMEM127, TP53*, TSC1, TSC2, VHL and XRCC2 (sequencing  and deletion/duplication); EGFR, EGLN1, HOXB13, KIT, MITF, PDGFRA, POLD1, and POLE (sequencing only); EPCAM and GREM1 (deletion/duplication only). DNA and RNA analyses performed for * genes.      Current Therapy: Carboplatin, paclitaxel, pembrolizumab day 1 cycle 2 was 12/22/21  Interval history- Sydney Rios is a 40 yo female with breast cancer history as stated above and hypothyroidism on Armour natural desiccated thyroid replacement presenting to [facility] today with chief complaint of thyroid swelling x2 days ago.  Patient states her mother noticed swelling on the right side of her thyroid later in the day after patient received Keytruda. Patient states she was asymptomatic and then only became aware of it when her mother pointed out the swelling.  Patient states it was about the size of a walnut.  She denies any airway difficulty.  Tolerating PO intake without difficulty. Patient states swelling has drastically improved without any interventions tried. Patient states she has been noncompliant with her thyroid medication missing at least 6 days over the last 2 weeks. Patient admits to just being overwhelmed with having to take multiple medications since her cancer diagnosis. She started taking her thyroid medication consistently x 4 days ago. She denies fever, chills, cold sensitivity, weight gain, constipation, muscle aches or cramps, muscle weakness, diarrhea, seizures, palpitations.   ROS  All other systems are reviewed and are negative for acute change except as noted in the HPI.   No Known Allergies   Past Medical History:  Diagnosis Date   Anxiety    Chronic kidney disease    hx kidney stones  Complication of anesthesia    runs heart rate in the 30-40s in PACU   History of kidney stones    Hx gestational diabetes    Hx of Hashimoto thyroiditis    No pertinent past medical history    PONV (postoperative nausea and vomiting)      Past Surgical History:  Procedure Laterality  Date   CESAREAN SECTION     CESAREAN SECTION  01/23/2013   Procedure: CESAREAN SECTION;  Surgeon: Luz Lex, MD;  Location: [facility];  Service: Obstetrics;  Laterality: N/A;   CESAREAN SECTION N/A 10/11/2014   Procedure: CESAREAN SECTION;  Surgeon: Cyril Mourning, MD;  Location: [facility];  Service: Obstetrics;  Laterality: N/A;   CESAREAN SECTION N/A 10/24/2018   Procedure: REPEAT CESAREAN SECTION;  Surgeon: Dian Queen, MD;  Location: [facility];  Service: Obstetrics;  Laterality: N/A;  Tracey RNFA   LITHOTRIPSY     NO PAST SURGERIES     PORTACATH PLACEMENT Left 11/25/2021   Procedure: PORT PLACEMENT;  Surgeon: Rolm Bookbinder, MD;  Location: [place];  Service: General;  Laterality: Left;   WISDOM TOOTH EXTRACTION      Social History   Socioeconomic History   Marital status: Married    Spouse name: Not on file   Number of children: Not on file   Years of education: Not on file   Highest education level: Not on file  Occupational History   Not on file  Tobacco Use   Smoking status: Never   Smokeless tobacco: Never  Vaping Use   Vaping Use: Never used  Substance and Sexual Activity   Alcohol use: No   Drug use: No   Sexual activity: Yes    Birth control/protection: Other-see comments    Comment: Husband has had a vasectomy  Other Topics Concern   Not on file  Social History Narrative   Not on file   Social Determinants of Health   Financial Resource Strain: Not on file  Food Insecurity: Not on file  Transportation Needs: Not on file  Physical Activity: Not on file  Stress: Not on file  Social Connections: Not on file  Intimate Partner Violence: Not on file    Family History  Problem Relation Age of Onset   Hypertension Mother    Diabetes Mother    Hypertension Father    Hypertension Maternal Grandmother    Diabetes Maternal Grandmother    Heart disease Maternal Grandfather    Hypertension Maternal Grandfather    Heart disease Paternal  Grandfather    Hypertension Paternal Grandfather    Retinoblastoma Cousin        dx 3 mo - left eye     Current Outpatient Medications:    Ascorbic Acid (VITAMIN C) 1000 MG tablet, Take 1,000 mg by mouth 2 (two) times daily as needed (immune support (if sick))., Disp: , Rfl:    Cholecalciferol (VITAMIN D3) 125 MCG (5000 UT) TABS, Take 5,000 Units by mouth daily as needed (immune support (if sick))., Disp: , Rfl:    IVERMECTIN PO, Take 20 mg by mouth in the morning., Disp: , Rfl:    lidocaine-prilocaine (EMLA) cream, Apply to affected area once, Disp: 30 g, Rfl: 3   Menaquinone-7 (VITAMIN K2) 100 MCG CAPS, Take 100 mcg by mouth daily as needed (immune support (if sick))., Disp: , Rfl:    NALTREXONE HCL PO, Take 1 mg by mouth in the morning., Disp: , Rfl:    ondansetron (ZOFRAN) 8 MG  tablet, Take 1 tablet (8 mg total) by mouth 2 (two) times daily as needed. Start on the third day after carboplatin and AC chemotherapy., Disp: 30 tablet, Rfl: 1   [START ON 03/10/2022] pegfilgrastim-bmez (ZIEXTENZO) 6 MG/0.6ML injection, Inject 0.6 mLs (6 mg total) into the skin once for 1 dose., Disp: 0.6 mL, Rfl: 2   prochlorperazine (COMPAZINE) 10 MG tablet, Take 1 tablet (10 mg total) by mouth every 6 (six) hours as needed (Nausea or vomiting)., Disp: 30 tablet, Rfl: 1   thyroid (ARMOUR) 30 MG tablet, Take 30 mg by mouth daily before breakfast., Disp: , Rfl:    valACYclovir (VALTREX) 1000 MG tablet, Take 2 g by mouth 2 (two) times daily as needed (cold sore/fever blisters)., Disp: , Rfl:    Zinc 50 MG TABS, Take 50 mg by mouth daily as needed., Disp: , Rfl:   PHYSICAL EXAM: ECOG FS:1 - Symptomatic but completely ambulatory    Vitals:   12/24/21 1017  BP: (!) 109/47  Pulse: 89  Resp: 18  Temp: 98.1 F (36.7 C)  TempSrc: Oral  SpO2: 100%  Weight: 149 lb 8 oz (67.8 kg)  Height: 5' 7" (1.702 m)   Physical Exam Vitals and nursing note reviewed.  Constitutional:      Appearance: She is well-developed.  She is not ill-appearing or toxic-appearing.  HENT:     Head: Normocephalic and atraumatic.     Right Ear: External ear normal.     Left Ear: External ear normal.     Nose: Nose normal.  Eyes:     General: No scleral icterus.       Right eye: No discharge.        Left eye: No discharge.     Conjunctiva/sclera: Conjunctivae normal.  Neck:     Thyroid: Thyromegaly present. No thyroid tenderness.     Vascular: No JVD.     Trachea: Trachea normal.     Comments: Right side of thyroid enlarged on exam. No nodules felt Cardiovascular:     Rate and Rhythm: Normal rate and regular rhythm.     Pulses: Normal pulses.     Heart sounds: Normal heart sounds.  Pulmonary:     Effort: Pulmonary effort is normal.     Breath sounds: Normal breath sounds.  Chest:     Comments: Port in left upper chest without signs of infection Abdominal:     General: There is no distension.  Musculoskeletal:        General: Normal range of motion.     Cervical back: Normal range of motion.  Lymphadenopathy:     Cervical: No cervical adenopathy.  Skin:    General: Skin is warm and dry.  Neurological:     Mental Status: She is oriented to person, place, and time.     GCS: GCS eye subscore is 4. GCS verbal subscore is 5. GCS motor subscore is 6.     Comments: Fluent speech, no facial droop.  Psychiatric:        Behavior: Behavior normal.       LABORATORY DATA: I have reviewed the data as listed CBC Latest Ref Rng & Units 12/24/2021 12/22/2021 12/14/2021  WBC 4.0 - 10.5 K/uL 3.1(L) 2.6(L) 2.5(L)  Hemoglobin 12.0 - 15.0 g/dL 9.5(L) 10.2(L) 10.0(L)  Hematocrit 36.0 - 46.0 % 29.2(L) 31.3(L) 30.4(L)  Platelets 150 - 400 K/uL 181 175 178     CMP Latest Ref Rng & Units 12/24/2021 12/22/2021 12/14/2021  Glucose 70 - 99  mg/dL 111(H) 127(H) 101(H)  BUN 6 - 20 mg/dL _0 Creatinine 0.44 - 1.00 mg/dL 0.57 0.70 0.65  Sodium 135 - 145 mmol/L 141 141 141  Potassium 3.5 - 5.1 mmol/L 3.9 4.0 4.1  Chloride 98  - 111 mmol/L 109 110 112(H)  CO2 22 - 32 mmol/L _1 Calcium 8.9 - 10.3 mg/dL 9.3 9.5 8.8(L)  Total Protein 6.5 - 8.1 g/dL 6.3(L) 6.4(L) 6.2(L)  Total Bilirubin 0.3 - 1.2 mg/dL 0.8 0.3 <0.2(L)  Alkaline Phos 38 - 126 U/L 38 46 44  AST 15 - 41 U/L 17 13(L) 14(L)  ALT 0 - 44 U/L 35 18 15       RADIOGRAPHIC STUDIES: I have personally reviewed the radiological images as listed and agreed with the findings in the report. No images are attached to the encounter. DG Chest 1 View  Result Date: 11/25/2021 CLINICAL DATA:  Port-A-Cath placement. EXAM: CHEST  1 VIEW COMPARISON:  None. FINDINGS: Left IJ access Port-A-Cath with distal tip in the SVC. Total fluoroscopic time was 31 seconds and fluoroscopic dose was 2.327 mGy. IMPRESSION: Left IJ access Port-A-Cath with distal tip in the SVC. Electronically Signed   By: Keane Police D.O.   On: 11/25/2021 09:16   NM Bone Scan Whole Body  Result Date: 11/27/2021 CLINICAL DATA:  Breast cancer.  Staging EXAM: NUCLEAR MEDICINE WHOLE BODY BONE SCAN TECHNIQUE: Whole body anterior and posterior images were obtained approximately 3 hours after intravenous injection of radiopharmaceutical. RADIOPHARMACEUTICALS:  20.7 mCi Technetium-87mMDP IV COMPARISON:  CT CAP, 11/18/2021. FINDINGS: No evidence of focal radiotracer uptake within the axial or appendicular skeleton. Incidental contrast retention within the RIGHT renal renal pelvis and proximal ureter. IMPRESSION: 1. No osteoblastic metastases within the axial or appendicular skeleton. 2. Incidental RIGHT pelviectasis versus mild hydronephrosis. Electronically Signed   By: JMichaelle BirksM.D.   On: 11/27/2021 17:04   DG C-Arm 1-60 Min-No Report  Result Date: 11/25/2021 Fluoroscopy was utilized by the requesting physician.  No radiographic interpretation.     ASSESSMENT & PLAN: Patient is a 40y.o. female with past medical history of estrogen receptor negative right breast cancer followed by oncologist Dr.  GLindi Adie  #) Enlarged thyroid-  Patient currently taking Armour Thyroid 30 mg daily prescribed by [facility] recent noncompliance over the last 2 weeks. Exam with enlarged right thyroid although it is improved compared to the picture she shows me on her phone when it was as large as a walnut. No nodules felt on exam and airway is intact. TSH today is <0.080 which is the same as x 2 days ago when comparing recent labs. T4 is in process. Ddx includes Keytruda induced thyroiditis. Will reach out to oncologist tomorrow when he returns to the office to discuss if dose adjustments need to be made for [facility]. Patient currently not exhibiting symptoms of thyroid storm. CMP is overall unremarkable.  #) Leukopenia- WBC today is 3.1 and ANC 2.0 This is slightly improved from labs x 2 days ago when WBC 2.6 and ANC 1.5. Patient was scheduled for filgrastim-aafi injection tomorrow. Since she is here in clinic today she was given injection. She knows to take Claritin for bone pain.  #)Anemia- hemoglobin today is 9.5 with baseline looking to be around 10. No indications for infusion at this time. This will continue to be monitored at future appointments. Patient has history of anemia, has not required blood transfusions in the past.  She denies any abnormal bleeding.  #)Breast Cancer- as per above will reach out to oncologist to discuss if dose reduction of keytruda needed. Will proceed per oncologist's recommendations.    Visit Diagnosis: 1. Port-A-Cath in place   2. Hypothyroidism, unspecified type   3. Malignant neoplasm of upper-outer quadrant of right breast in female, estrogen receptor negative ([place])   4. Anemia, unspecified type      No orders of the defined types were placed in this encounter.   All questions were answered. The patient knows to call the clinic with any problems, questions or concerns. No barriers to learning was detected.  I have spent a  total of 30 minutes minutes of face-to-face and non-face-to-face time, preparing to see the patient, obtaining and/or reviewing separately obtained history, performing a medically appropriate examination, counseling and educating the patient, ordering tests,  documenting clinical information in the electronic health record, and care coordination.     Thank you for allowing me to participate in the care of this patient.    Barrie Folk, PA-C Department of Hematology/Oncology [facility] Phone: [phone]  [phone]    12/24/2021 5:33 PM

## 2021-12-25 ENCOUNTER — Telehealth: Payer: Self-pay | Admitting: *Deleted

## 2021-12-25 ENCOUNTER — Inpatient Hospital Stay: Payer: Managed Care, Other (non HMO)

## 2021-12-25 LAB — T4: T4, Total: 11.9 ug/dL (ref 4.5–12.0)

## 2021-12-25 NOTE — Telephone Encounter (Signed)
This RN returned VM left by pt stating onset this AM of fever 100.4,x 1 increased heart rate ( over 100) and bp reading 90/50.  She denies chills, pain or burning with urination or onset of cough.  This RN stated concern and need to come to the office for work up per fever and chemotherapy.  Kearie states she feels " like it more related to my thyroid issue "  This RN asked if she has resumed her thyroid medication -noted as not taken x 6 days per visit yesterday with [facility] provider and TSH of 00.80 on treatment day including [place].  Glada states she has not taken it " because my symptoms seem to be more in line with hyper thyroid not hypo "   This RN informed her above will be reviewed with MD and call returned to her.  Per MD reviewed - issue likely related to [place] and thyroid sequela - recommends for pt to hold thyroid medication.  Pt should monitor symptoms and if worsens or temp goes above 100.5 or she develops pain/burning with urination,chills or cough she should call the on call MD.  Otila Kluver verbalized understanding.

## 2021-12-25 NOTE — Addendum Note (Signed)
Encounter addended by: Glo Herring on: 12/25/2021 3:19 PM  Actions taken: Imaging Exam ended

## 2021-12-27 ENCOUNTER — Encounter: Payer: Self-pay | Admitting: Hematology and Oncology

## 2021-12-28 NOTE — Progress Notes (Signed)
[facility] Follow up:    Sydney Rasmussen, MD [address] [place] [address]   DIAGNOSIS:  Cancer Staging  Malignant neoplasm of upper-outer quadrant of right breast in female, estrogen receptor negative ([place]) Staging form: Breast, AJCC 8th Edition - Clinical stage from 11/11/2021: Stage IIB (cT2, cN1, cM0, G3, ER+, PR+, HER2-) - Signed by Nicholas Lose, MD on 11/11/2021 Stage prefix: Initial diagnosis Histologic grading system: 3 grade system   SUMMARY OF ONCOLOGIC HISTORY: Oncology History  Malignant neoplasm of upper-outer quadrant of right breast in female, estrogen receptor negative ([place])  11/04/2021 Initial Diagnosis    Patient has been breast-feeding.  Felt a painful right breast mass and an axillary mass.  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 with a Ki-67 of 40%, 1 abnormal lymph node 5.8 cm biopsy: Grade 3 IDC ER 5%, PR 0%, HER2 negative, Ki-67 40%   11/11/2021 Cancer Staging   Staging form: Breast, AJCC 8th Edition - Clinical stage from 11/11/2021: Stage IIB (cT2, cN1, cM0, G3, ER+, PR+, HER2-) - Signed by Nicholas Lose, MD on 11/11/2021 Stage prefix: Initial diagnosis Histologic grading system: 3 grade system    11/30/2021 -  Chemotherapy   Patient is on Treatment Plan : BREAST Pembrolizumab + Carboplatin D1 + Paclitaxel D1,8,15 q21d X 4 cycles / Pembrolizumab + AC q21d x 4 cycles     11/30/2021 Genetic Testing   Negative genetic testing on the CancerNext-Expanded+RNA insight panel.  The report date is 11/30/2021  The CancerNext-Expanded gene panel offered by [facility] and includes sequencing and rearrangement analysis for the following 77 genes: AIP, ALK, APC*, ATM*, AXIN2, BAP1, BARD1, BLM, BMPR1A, BRCA1*, BRCA2*, BRIP1*, CDC73, CDH1*, CDK4, CDKN1B, CDKN2A, CHEK2*, CTNNA1, DICER1, FANCC, FH, FLCN, GALNT12, KIF1B, LZTR1, MAX, MEN1, MET, MLH1*, MSH2*, MSH3, MSH6*, MUTYH*, NBN, NF1*,  NF2, NTHL1, PALB2*, PHOX2B, PMS2*, POT1, PRKAR1A, PTCH1, PTEN*, RAD51C*, RAD51D*, RB1, RECQL, RET, SDHA, SDHAF2, SDHB, SDHC, SDHD, SMAD4, SMARCA4, SMARCB1, SMARCE1, STK11, SUFU, TMEM127, TP53*, TSC1, TSC2, VHL and XRCC2 (sequencing and deletion/duplication); EGFR, EGLN1, HOXB13, KIT, MITF, PDGFRA, POLD1, and POLE (sequencing only); EPCAM and GREM1 (deletion/duplication only). DNA and RNA analyses performed for * genes.      CURRENT THERAPY: Neoadjuvant Carbo/Taxol/Keytruda  INTERVAL HISTORY: Sydney Rios 41 y.o. female returns for    Patient Active Problem List   Diagnosis Date Noted   Genetic testing 12/01/2021   Port-A-Cath in place 11/30/2021   Malignant neoplasm of upper-outer quadrant of right breast in female, estrogen receptor negative ([place]) 11/11/2021   Abnormal maternal glucose tolerance, antepartum 09/15/2018   S/P cesarean section 10/11/2014   TACHYCARDIA 05/01/2010    has No Known Allergies.  MEDICAL HISTORY: Past Medical History:  Diagnosis Date   Anxiety    Chronic kidney disease    hx kidney stones   Complication of anesthesia    runs heart rate in the 30-40s in PACU   History of kidney stones    Hx gestational diabetes    Hx of Hashimoto thyroiditis    No pertinent past medical history    PONV (postoperative nausea and vomiting)     SURGICAL HISTORY: Past Surgical History:  Procedure Laterality Date   CESAREAN SECTION     CESAREAN SECTION  01/23/2013   Procedure: CESAREAN SECTION;  Surgeon: Luz Lex, MD;  Location: [facility];  Service: Obstetrics;  Laterality: N/A;   CESAREAN SECTION N/A 10/11/2014   Procedure:  CESAREAN SECTION;  Surgeon: Jeani Hawking, MD;  Location: [facility];  Service: Obstetrics;  Laterality: N/A;   CESAREAN SECTION N/A 10/24/2018   Procedure: REPEAT CESAREAN SECTION;  Surgeon: Marcelle Overlie, MD;  Location: [facility];  Service: Obstetrics;  Laterality: N/A;  Tracey RNFA   LITHOTRIPSY     NO PAST SURGERIES      PORTACATH PLACEMENT Left 11/25/2021   Procedure: PORT PLACEMENT;  Surgeon: Emelia Loron, MD;  Location: [facility];  Service: General;  Laterality: Left;   WISDOM TOOTH EXTRACTION      SOCIAL HISTORY: Social History   Socioeconomic History   Marital status: Married    Spouse name: Not on file   Number of children: Not on file   Years of education: Not on file   Highest education level: Not on file  Occupational History   Not on file  Tobacco Use   Smoking status: Never   Smokeless tobacco: Never  Vaping Use   Vaping Use: Never used  Substance and Sexual Activity   Alcohol use: No   Drug use: No   Sexual activity: Yes    Birth control/protection: Other-see comments    Comment: Husband has had a vasectomy  Other Topics Concern   Not on file  Social History Narrative   Not on file   Social Determinants of Health   Financial Resource Strain: Not on file  Food Insecurity: Not on file  Transportation Needs: Not on file  Physical Activity: Not on file  Stress: Not on file  Social Connections: Not on file  Intimate Partner Violence: Not on file    FAMILY HISTORY: Family History  Problem Relation Age of Onset   Hypertension Mother    Diabetes Mother    Hypertension Father    Hypertension Maternal Grandmother    Diabetes Maternal Grandmother    Heart disease Maternal Grandfather    Hypertension Maternal Grandfather    Heart disease Paternal Grandfather    Hypertension Paternal Grandfather    Retinoblastoma Cousin        dx 3 mo - left eye    Review of Systems - Oncology    PHYSICAL EXAMINATION  ECOG PERFORMANCE STATUS: 1 - Symptomatic but completely ambulatory  Vitals:   12/29/21 1404  BP: (!) 113/56  Pulse: 92  Resp: 18  Temp: (!) 96.2 F (35.7 C)  SpO2: 100%    Physical Exam  LABORATORY DATA:  CBC    Component Value Date/Time   WBC 2.0 (L) 12/29/2021 1345   WBC 9.6 10/25/2018 0515   RBC 3.19 (L) 12/29/2021 1345   HGB 9.5 (L) 12/29/2021  1345   HCT 28.9 (L) 12/29/2021 1345   PLT 170 12/29/2021 1345   MCV 90.6 12/29/2021 1345   MCH 29.8 12/29/2021 1345   MCHC 32.9 12/29/2021 1345   RDW 14.6 12/29/2021 1345   LYMPHSABS 0.7 12/29/2021 1345   MONOABS 0.3 12/29/2021 1345   EOSABS 0.0 12/29/2021 1345   BASOSABS 0.0 12/29/2021 1345    CMP     Component Value Date/Time   NA 139 12/29/2021 1345   K 4.3 12/29/2021 1345   CL 107 12/29/2021 1345   CO2 28 12/29/2021 1345   GLUCOSE 102 (H) 12/29/2021 1345   BUN 15 12/29/2021 1345   CREATININE 0.49 12/29/2021 1345   CALCIUM 9.4 12/29/2021 1345   PROT 6.6 12/29/2021 1345   ALBUMIN 4.1 12/29/2021 1345   AST 13 (L) 12/29/2021 1345   ALT 14 12/29/2021 1345  ALKPHOS 48 12/29/2021 1345   BILITOT 0.2 (L) 12/29/2021 1345   GFRNONAA >60 12/29/2021 1345   GFRAA >60 10/22/2018 2142    ASSESSMENT and THERAPY PLAN:   Malignant neoplasm of upper-outer quadrant of right breast in female, estrogen receptor negative ([place]) 11/04/2021: Patient has been breast-feeding.  Felt a painful right breast mass and an axillary mass.  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 with a Ki-67 of 40%, 1 abnormal lymph node 5.8 cm biopsy: Grade 3 IDC ER 5%, PR 0%, HER2 negative, Ki-67 40%   CT CAP 11/20/2021: Right breast cancer with axillary lymph nodes, mildly enlarged right retropectoral lymph nodes, right internal mammary lymph nodes Breast MRI 11/17/2021: Known breast cancer 2 cm, right axillary lymph node, non-mass enhancement 7 cm, borderline enlarged right internal mammary lymph nodes   Treatment Plan: based on multidisciplinary tumor board: 1. Neoadjuvant chemotherapy with Taxol weekly 12 with Keytruda and carboplatin every 3 weeks x4 Adriamycin and Cytoxan, Keytruda dose dense 4 followed by  followed by Beryle Flock maintenance for 1 year 2. Followed by mastectomy with targeted axillary dissection 3. Followed by adjuvant radiation therapy   ---------------------------------------------------------------------------------------------------------------------------   Current Treatment: Cycle 2 day 8 Taxol carboplatin and Keytruda  Labs reviewed, chemo education completed      Chemo Toxicities: Patient could not tolerate 50 mg of Benadryl.  We will decrease it to 12.5 mg. Consultation due to antinausea medications Neutropenia: Today's ANC is 1    RTC to see me weekly on chemo and every other week for follow up with me   Orders Placed This Encounter  Procedures   US THYROID    Standing Status:   Future    Standing Expiration Date:   12/29/2022    Order Specific Question:   Reason for Exam (SYMPTOM  OR DIAGNOSIS REQUIRED)    Answer:   thyromegaly    Order Specific Question:   Preferred imaging location?    Answer:   [facility]   Attending Note  I personally saw and examined Sydney Rios. The plan of care was discussed with her. I agree with the physical exam findings and assessment and plan as documented above. I performed the majority of the counseling and assessment and plan regarding this encounter Neutropenia: I recommended that we add 2 doses of Granix after each Taxol treatment.  She will get 3 dose of Granix after she gets Taxol [place]. Thyroid enlargement: We will obtain ultrasound of the thyroid.  If it shows inflammatory changes then we might have to give her steroids. Hypothyroidism: I discussed with her that the guidelines indicate that we can watch the hypothyroidism as it leads to a scant phase and becomes hypothyroid. Signed Harriette Ohara, MD     All questions were answered. The patient knows to call the clinic with any problems, questions or concerns. We can certainly see the patient much sooner if necessary. This note was electronically signed. Harriette Ohara, MD 12/30/2021

## 2021-12-28 NOTE — Assessment & Plan Note (Signed)
11/04/2021: Patient has been breast-feeding.  Felt a painful right breast mass and an axillary mass.  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 with a Ki-67 of 40%, 1 abnormal lymph node 5.8 cm biopsy: Grade 3 IDC ER 5%, PR 0%, HER2 negative, Ki-67 40% °  °CT CAP 11/20/2021: Right breast cancer with axillary lymph nodes, mildly enlarged right retropectoral lymph nodes, right internal mammary lymph nodes °Breast MRI 11/17/2021: Known breast cancer 2 cm, right axillary lymph node, non-mass enhancement 7 cm, borderline enlarged right internal mammary lymph nodes °  °Treatment Plan: based on multidisciplinary tumor board: °1. Neoadjuvant chemotherapy with Taxol weekly ×12 with Keytruda and carboplatin every 3 weeks x4 Adriamycin and Cytoxan, Keytruda dose dense ×4 followed by  followed by Keytruda maintenance for 1 year °2. Followed by mastectomy with targeted axillary dissection °3. Followed by adjuvant radiation therapy ° --------------------------------------------------------------------------------------------------------------------------- °  °Current Treatment: Cycle 2 day 8 Taxol carboplatin and Keytruda  °Labs reviewed, chemo education completed °  ° ° °Chemo Toxicities: ° °  °RTC to see me weekly on chemo and every other week for follow up with me °

## 2021-12-29 ENCOUNTER — Inpatient Hospital Stay: Payer: 59 | Attending: Hematology and Oncology

## 2021-12-29 ENCOUNTER — Inpatient Hospital Stay: Payer: 59

## 2021-12-29 ENCOUNTER — Other Ambulatory Visit: Payer: Self-pay

## 2021-12-29 ENCOUNTER — Encounter: Payer: Self-pay | Admitting: Hematology and Oncology

## 2021-12-29 ENCOUNTER — Encounter: Payer: Self-pay | Admitting: Adult Health

## 2021-12-29 ENCOUNTER — Inpatient Hospital Stay (HOSPITAL_BASED_OUTPATIENT_CLINIC_OR_DEPARTMENT_OTHER): Payer: 59 | Admitting: Adult Health

## 2021-12-29 VITALS — BP 113/56 | HR 92 | Temp 96.2°F | Resp 18 | Wt 146.4 lb

## 2021-12-29 DIAGNOSIS — Z171 Estrogen receptor negative status [ER-]: Secondary | ICD-10-CM | POA: Diagnosis not present

## 2021-12-29 DIAGNOSIS — R5383 Other fatigue: Secondary | ICD-10-CM | POA: Insufficient documentation

## 2021-12-29 DIAGNOSIS — C50411 Malignant neoplasm of upper-outer quadrant of right female breast: Secondary | ICD-10-CM

## 2021-12-29 DIAGNOSIS — Z5111 Encounter for antineoplastic chemotherapy: Secondary | ICD-10-CM | POA: Insufficient documentation

## 2021-12-29 DIAGNOSIS — C773 Secondary and unspecified malignant neoplasm of axilla and upper limb lymph nodes: Secondary | ICD-10-CM | POA: Insufficient documentation

## 2021-12-29 DIAGNOSIS — D701 Agranulocytosis secondary to cancer chemotherapy: Secondary | ICD-10-CM | POA: Insufficient documentation

## 2021-12-29 DIAGNOSIS — E039 Hypothyroidism, unspecified: Secondary | ICD-10-CM

## 2021-12-29 DIAGNOSIS — Z5112 Encounter for antineoplastic immunotherapy: Secondary | ICD-10-CM | POA: Diagnosis not present

## 2021-12-29 DIAGNOSIS — Z95828 Presence of other vascular implants and grafts: Secondary | ICD-10-CM

## 2021-12-29 LAB — CBC WITH DIFFERENTIAL (CANCER CENTER ONLY)
Abs Immature Granulocytes: 0 10*3/uL (ref 0.00–0.07)
Basophils Absolute: 0 10*3/uL (ref 0.0–0.1)
Basophils Relative: 1 %
Eosinophils Absolute: 0 10*3/uL (ref 0.0–0.5)
Eosinophils Relative: 2 %
HCT: 28.9 % — ABNORMAL LOW (ref 36.0–46.0)
Hemoglobin: 9.5 g/dL — ABNORMAL LOW (ref 12.0–15.0)
Immature Granulocytes: 0 %
Lymphocytes Relative: 36 %
Lymphs Abs: 0.7 10*3/uL (ref 0.7–4.0)
MCH: 29.8 pg (ref 26.0–34.0)
MCHC: 32.9 g/dL (ref 30.0–36.0)
MCV: 90.6 fL (ref 80.0–100.0)
Monocytes Absolute: 0.3 10*3/uL (ref 0.1–1.0)
Monocytes Relative: 13 %
Neutro Abs: 1 10*3/uL — ABNORMAL LOW (ref 1.7–7.7)
Neutrophils Relative %: 48 %
Platelet Count: 170 10*3/uL (ref 150–400)
RBC: 3.19 MIL/uL — ABNORMAL LOW (ref 3.87–5.11)
RDW: 14.6 % (ref 11.5–15.5)
WBC Count: 2 10*3/uL — ABNORMAL LOW (ref 4.0–10.5)
nRBC: 0 % (ref 0.0–0.2)

## 2021-12-29 LAB — CMP (CANCER CENTER ONLY)
ALT: 14 U/L (ref 0–44)
AST: 13 U/L — ABNORMAL LOW (ref 15–41)
Albumin: 4.1 g/dL (ref 3.5–5.0)
Alkaline Phosphatase: 48 U/L (ref 38–126)
Anion gap: 4 — ABNORMAL LOW (ref 5–15)
BUN: 15 mg/dL (ref 6–20)
CO2: 28 mmol/L (ref 22–32)
Calcium: 9.4 mg/dL (ref 8.9–10.3)
Chloride: 107 mmol/L (ref 98–111)
Creatinine: 0.49 mg/dL (ref 0.44–1.00)
GFR, Estimated: >60 mL/min (ref >60)
Glucose, Bld: 102 mg/dL — ABNORMAL HIGH (ref 70–99)
Potassium: 4.3 mmol/L (ref 3.5–5.1)
Sodium: 139 mmol/L (ref 135–145)
Total Bilirubin: 0.2 mg/dL — ABNORMAL LOW (ref 0.3–1.2)
Total Protein: 6.6 g/dL (ref 6.5–8.1)

## 2021-12-29 LAB — TSH: TSH: <0.08 u[IU]/mL — ABNORMAL LOW (ref 0.308–3.960)

## 2021-12-29 MED ORDER — DIPHENHYDRAMINE HCL 50 MG/ML IJ SOLN
12.5000 mg | Freq: Once | INTRAMUSCULAR | Status: AC
  Administered 2021-12-29: 12.5 mg via INTRAVENOUS
  Filled 2021-12-29: qty 1

## 2021-12-29 MED ORDER — SODIUM CHLORIDE 0.9% FLUSH
10.0000 mL | INTRAVENOUS | Status: DC | PRN
  Administered 2021-12-29: 10 mL

## 2021-12-29 MED ORDER — SODIUM CHLORIDE 0.9 % IV SOLN
60.0000 mg/m2 | Freq: Once | INTRAVENOUS | Status: AC
  Administered 2021-12-29: 108 mg via INTRAVENOUS
  Filled 2021-12-29: qty 18

## 2021-12-29 MED ORDER — FAMOTIDINE 20 MG IN NS 100 ML IVPB
20.0000 mg | Freq: Once | INTRAVENOUS | Status: AC
  Administered 2021-12-29: 20 mg via INTRAVENOUS
  Filled 2021-12-29: qty 100

## 2021-12-29 MED ORDER — HEPARIN SOD (PORK) LOCK FLUSH 100 UNIT/ML IV SOLN
500.0000 [IU] | Freq: Once | INTRAVENOUS | Status: AC | PRN
  Administered 2021-12-29: 500 [IU]

## 2021-12-29 MED ORDER — SODIUM CHLORIDE 0.9 % IV SOLN: Freq: Once | INTRAVENOUS | Status: AC

## 2021-12-29 MED ORDER — SODIUM CHLORIDE 0.9 % IV SOLN
10.0000 mg | Freq: Once | INTRAVENOUS | Status: AC
  Administered 2021-12-29: 10 mg via INTRAVENOUS
  Filled 2021-12-29: qty 10

## 2021-12-29 MED ORDER — SODIUM CHLORIDE 0.9% FLUSH
10.0000 mL | Freq: Once | INTRAVENOUS | Status: AC
  Administered 2021-12-29: 10 mL

## 2021-12-29 NOTE — Progress Notes (Signed)
The following biosimilar Zarxio ([id number]) has been selected for use in this patient.  Kennith Center, Pharm.D., CPP 12/29/2021@3 :42 PM

## 2021-12-29 NOTE — Patient Instructions (Signed)
[facility]COLOG[facility]   Discharge Instructions: Thank you for choosing [facility] to provide your oncology and hematology care.   If you have a lab appointment with the Cancer Center, please go directly to the Cancer Center and check in at the registration area.   Wear comfortable clothing and clothing appropriate for easy access to any Portacath or PICC line.   We strive to give you quality time with your provider. You may need to reschedule your appointment if you arrive late (15 or more minutes).  Arriving late affects you and other patients whose appointments are after yours.  Also, if you miss three or more appointments without notifying the office, you may be dismissed from the clinic at the provider's discretion.      For prescription refill requests, have your pharmacy contact our office and allow 72 hours for refills to be completed.    Today you received the following chemotherapy and/or immunotherapy agents: paclitaxel.      To help prevent nausea and vomiting after your treatment, we encourage you to take your nausea medication as directed.  BELOW ARE SYMPTOMS THAT SHOULD BE REPORTED IMMEDIATELY: *FEVER GREATER THAN 100.4 F (38 C) OR HIGHER *CHILLS OR SWEATING *NAUSEA AND VOMITING THAT IS NOT CONTROLLED WITH YOUR NAUSEA MEDICATION *UNUSUAL SHORTNESS OF BREATH *UNUSUAL BRUISING OR BLEEDING *URINARY PROBLEMS (pain or burning when urinating, or frequent urination) *BOWEL PROBLEMS (unusual diarrhea, constipation, pain near the anus) TENDERNESS IN MOUTH AND THROAT WITH OR WITHOUT PRESENCE OF ULCERS (sore throat, sores in mouth, or a toothache) UNUSUAL RASH, SWELLING OR PAIN  UNUSUAL VAGINAL DISCHARGE OR ITCHING   Items with * indicate a potential emergency and should be followed up as soon as possible or go to the Emergency Department if any problems should occur.  Please show the CHEMOTHERAPY ALERT CARD or IMMUNOTHERAPY ALERT CARD at check-in  to the Emergency Department and triage nurse.  Should you have questions after your visit or need to cancel or reschedule your appointment, please contact [facility]COL[facility]  Dept: [phone]  and follow the prompts.  Office hours are 8:00 a.m. to 4:30 p.m. Monday - Friday. Please note that voicemails left after 4:00 p.m. may not be returned until the following business day.  We are closed weekends and major holidays. You have access to a nurse at all times for urgent questions. Please call the main number to the clinic Dept: [phone] and follow the prompts.   For any non-urgent questions, you may also contact your provider using MyChart. We now offer e-Visits for anyone 18 and older to request care online for non-urgent symptoms. For details visit [email or web address].   Also download the MyChart app! Go to the app store, search "MyChart", open the app, select [facility], and log in with your MyChart username and password.  Due to Covid, a mask is required upon entering the hospital/clinic. If you do not have a mask, one will be given to you upon arrival. For doctor visits, patients may have 1 support person aged 18 or older with them. For treatment visits, patients cannot have anyone with them due to current Covid guidelines and our immunocompromised population.   

## 2021-12-29 NOTE — Progress Notes (Signed)
Ok to treat with ANC of 1.0 per Wilber Bihari, NP

## 2021-12-30 ENCOUNTER — Encounter: Payer: Self-pay | Admitting: Adult Health

## 2021-12-30 ENCOUNTER — Inpatient Hospital Stay: Payer: 59

## 2021-12-30 ENCOUNTER — Telehealth: Payer: Self-pay | Admitting: *Deleted

## 2021-12-30 ENCOUNTER — Other Ambulatory Visit: Payer: Self-pay

## 2021-12-30 VITALS — BP 117/59 | HR 107 | Temp 98.7°F | Resp 17

## 2021-12-30 DIAGNOSIS — Z95828 Presence of other vascular implants and grafts: Secondary | ICD-10-CM

## 2021-12-30 DIAGNOSIS — Z5112 Encounter for antineoplastic immunotherapy: Secondary | ICD-10-CM | POA: Diagnosis not present

## 2021-12-30 LAB — T4: T4, Total: 13.1 ug/dL — ABNORMAL HIGH (ref 4.5–12.0)

## 2021-12-30 MED ORDER — FILGRASTIM-SNDZ 480 MCG/0.8ML IJ SOSY
480.0000 ug | PREFILLED_SYRINGE | Freq: Once | INTRAMUSCULAR | Status: AC
  Administered 2021-12-30: 480 ug via SUBCUTANEOUS
  Filled 2021-12-30: qty 0.8

## 2021-12-30 NOTE — Telephone Encounter (Signed)
VM left by case manager - Raquel Sarna - at [facility] wanting to coordinate pending home delivery and administration of Ziextenzo with A/C portion of chemo regimen.  This returned call to [phone]- obtained identified VM- detailed message stating need to communicate for upcoming need as well as inquiry if Sydney Rios will be providing a home health nurse for administration/teaching.  Dr Geralyn Flash direct nurse number given for communication.

## 2021-12-31 ENCOUNTER — Ambulatory Visit: Payer: 59

## 2021-12-31 ENCOUNTER — Encounter: Payer: Self-pay | Admitting: Hematology and Oncology

## 2021-12-31 ENCOUNTER — Ambulatory Visit: Payer: Managed Care, Other (non HMO)

## 2022-01-01 ENCOUNTER — Other Ambulatory Visit: Payer: Self-pay | Admitting: *Deleted

## 2022-01-01 ENCOUNTER — Ambulatory Visit: Payer: Managed Care, Other (non HMO)

## 2022-01-01 ENCOUNTER — Other Ambulatory Visit: Payer: Self-pay

## 2022-01-01 ENCOUNTER — Inpatient Hospital Stay: Payer: 59

## 2022-01-01 DIAGNOSIS — Z5112 Encounter for antineoplastic immunotherapy: Secondary | ICD-10-CM | POA: Diagnosis not present

## 2022-01-01 DIAGNOSIS — Z95828 Presence of other vascular implants and grafts: Secondary | ICD-10-CM

## 2022-01-01 MED ORDER — FILGRASTIM-SNDZ 480 MCG/0.8ML IJ SOSY
480.0000 ug | PREFILLED_SYRINGE | Freq: Once | INTRAMUSCULAR | Status: AC
  Administered 2022-01-01: 480 ug via SUBCUTANEOUS
  Filled 2022-01-01: qty 0.8

## 2022-01-04 ENCOUNTER — Telehealth: Payer: Self-pay | Admitting: *Deleted

## 2022-01-04 ENCOUNTER — Ambulatory Visit: Payer: Managed Care, Other (non HMO)

## 2022-01-04 ENCOUNTER — Other Ambulatory Visit: Payer: Self-pay | Admitting: Hematology and Oncology

## 2022-01-04 ENCOUNTER — Other Ambulatory Visit: Payer: Managed Care, Other (non HMO)

## 2022-01-04 ENCOUNTER — Encounter: Payer: Self-pay | Admitting: *Deleted

## 2022-01-04 MED FILL — Dexamethasone Sodium Phosphate Inj 100 MG/10ML: INTRAMUSCULAR | Qty: 1 | Status: AC

## 2022-01-04 NOTE — Telephone Encounter (Signed)
Received message from [facility] team stating pt new insurance will cover for her to receive Zarxio injections at our facility.  RN attempt to contact pt.  No answer, LVM to return call to the office.

## 2022-01-04 NOTE — Progress Notes (Signed)
Clarification regarding Granix injections: Patient will get 2 injections of Granix after weekly Taxol's Patient will get 3 injections of Granix after treatment with Taxol and carboplatin

## 2022-01-05 ENCOUNTER — Inpatient Hospital Stay: Payer: 59

## 2022-01-05 ENCOUNTER — Other Ambulatory Visit: Payer: Self-pay

## 2022-01-05 VITALS — BP 107/66 | HR 90 | Temp 99.1°F | Resp 18

## 2022-01-05 DIAGNOSIS — Z95828 Presence of other vascular implants and grafts: Secondary | ICD-10-CM

## 2022-01-05 DIAGNOSIS — Z5112 Encounter for antineoplastic immunotherapy: Secondary | ICD-10-CM | POA: Diagnosis not present

## 2022-01-05 DIAGNOSIS — Z171 Estrogen receptor negative status [ER-]: Secondary | ICD-10-CM

## 2022-01-05 DIAGNOSIS — C50411 Malignant neoplasm of upper-outer quadrant of right female breast: Secondary | ICD-10-CM

## 2022-01-05 LAB — TSH: TSH: <0.08 u[IU]/mL — ABNORMAL LOW (ref 0.308–3.960)

## 2022-01-05 LAB — CBC WITH DIFFERENTIAL (CANCER CENTER ONLY)
Abs Immature Granulocytes: 0.1 10*3/uL — ABNORMAL HIGH (ref 0.00–0.07)
Basophils Absolute: 0 10*3/uL (ref 0.0–0.1)
Basophils Relative: 1 %
Eosinophils Absolute: 0 10*3/uL (ref 0.0–0.5)
Eosinophils Relative: 0 %
HCT: 28.5 % — ABNORMAL LOW (ref 36.0–46.0)
Hemoglobin: 9.3 g/dL — ABNORMAL LOW (ref 12.0–15.0)
Immature Granulocytes: 4 %
Lymphocytes Relative: 38 %
Lymphs Abs: 1 10*3/uL (ref 0.7–4.0)
MCH: 30.1 pg (ref 26.0–34.0)
MCHC: 32.6 g/dL (ref 30.0–36.0)
MCV: 92.2 fL (ref 80.0–100.0)
Monocytes Absolute: 0.4 10*3/uL (ref 0.1–1.0)
Monocytes Relative: 14 %
Neutro Abs: 1.1 10*3/uL — ABNORMAL LOW (ref 1.7–7.7)
Neutrophils Relative %: 43 %
Platelet Count: 245 10*3/uL (ref 150–400)
RBC: 3.09 MIL/uL — ABNORMAL LOW (ref 3.87–5.11)
RDW: 15.9 % — ABNORMAL HIGH (ref 11.5–15.5)
WBC Count: 2.6 10*3/uL — ABNORMAL LOW (ref 4.0–10.5)
nRBC: 0 % (ref 0.0–0.2)

## 2022-01-05 LAB — CMP (CANCER CENTER ONLY)
ALT: 13 U/L (ref 0–44)
AST: 13 U/L — ABNORMAL LOW (ref 15–41)
Albumin: 3.9 g/dL (ref 3.5–5.0)
Alkaline Phosphatase: 47 U/L (ref 38–126)
Anion gap: 4 — ABNORMAL LOW (ref 5–15)
BUN: 15 mg/dL (ref 6–20)
CO2: 28 mmol/L (ref 22–32)
Calcium: 9.1 mg/dL (ref 8.9–10.3)
Chloride: 106 mmol/L (ref 98–111)
Creatinine: 0.56 mg/dL (ref 0.44–1.00)
GFR, Estimated: >60 mL/min (ref >60)
Glucose, Bld: 133 mg/dL — ABNORMAL HIGH (ref 70–99)
Potassium: 4 mmol/L (ref 3.5–5.1)
Sodium: 138 mmol/L (ref 135–145)
Total Bilirubin: 0.1 mg/dL — ABNORMAL LOW (ref 0.3–1.2)
Total Protein: 6.2 g/dL — ABNORMAL LOW (ref 6.5–8.1)

## 2022-01-05 MED ORDER — SODIUM CHLORIDE 0.9 % IV SOLN
60.0000 mg/m2 | Freq: Once | INTRAVENOUS | Status: AC
  Administered 2022-01-05: 108 mg via INTRAVENOUS
  Filled 2022-01-05: qty 18

## 2022-01-05 MED ORDER — FAMOTIDINE 20 MG IN NS 100 ML IVPB
20.0000 mg | Freq: Once | INTRAVENOUS | Status: AC
  Administered 2022-01-05: 20 mg via INTRAVENOUS
  Filled 2022-01-05: qty 100

## 2022-01-05 MED ORDER — SODIUM CHLORIDE 0.9% FLUSH
10.0000 mL | Freq: Once | INTRAVENOUS | Status: AC
  Administered 2022-01-05: 10 mL

## 2022-01-05 MED ORDER — DIPHENHYDRAMINE HCL 50 MG/ML IJ SOLN
12.5000 mg | Freq: Once | INTRAMUSCULAR | Status: AC
  Administered 2022-01-05: 12.5 mg via INTRAVENOUS
  Filled 2022-01-05: qty 1

## 2022-01-05 MED ORDER — SODIUM CHLORIDE 0.9 % IV SOLN
10.0000 mg | Freq: Once | INTRAVENOUS | Status: AC
  Administered 2022-01-05: 10 mg via INTRAVENOUS
  Filled 2022-01-05: qty 10

## 2022-01-05 MED ORDER — SODIUM CHLORIDE 0.9 % IV SOLN: Freq: Once | INTRAVENOUS | Status: AC

## 2022-01-05 MED ORDER — SODIUM CHLORIDE 0.9% FLUSH
10.0000 mL | INTRAVENOUS | Status: DC | PRN
  Administered 2022-01-05: 10 mL

## 2022-01-05 MED ORDER — HEPARIN SOD (PORK) LOCK FLUSH 100 UNIT/ML IV SOLN
500.0000 [IU] | Freq: Once | INTRAVENOUS | Status: AC | PRN
  Administered 2022-01-05: 500 [IU]

## 2022-01-05 NOTE — Patient Instructions (Signed)
[facility] ONCOLOGY  Discharge Instructions: Thank you for choosing [facility] to provide your oncology and hematology care.   If you have a lab appointment with the [facility], please go directly to the [facility] and check in at the registration area.   Wear comfortable clothing and clothing appropriate for easy access to any Portacath or PICC line.   We strive to give you quality time with your provider. You may need to reschedule your appointment if you arrive late (15 or more minutes).  Arriving late affects you and other patients whose appointments are after yours.  Also, if you miss three or more appointments without notifying the office, you may be dismissed from the clinic at the providers discretion.      For prescription refill requests, have your pharmacy contact our office and allow 72 hours for refills to be completed.    Today you received the following chemotherapy and/or immunotherapy agents Taxol      To help prevent nausea and vomiting after your treatment, we encourage you to take your nausea medication as directed.  BELOW ARE SYMPTOMS THAT SHOULD BE REPORTED IMMEDIATELY: *FEVER GREATER THAN 100.4 F (38 C) OR HIGHER *CHILLS OR SWEATING *NAUSEA AND VOMITING THAT IS NOT CONTROLLED WITH YOUR NAUSEA MEDICATION *UNUSUAL SHORTNESS OF BREATH *UNUSUAL BRUISING OR BLEEDING *URINARY PROBLEMS (pain or burning when urinating, or frequent urination) *BOWEL PROBLEMS (unusual diarrhea, constipation, pain near the anus) TENDERNESS IN MOUTH AND THROAT WITH OR WITHOUT PRESENCE OF ULCERS (sore throat, sores in mouth, or a toothache) UNUSUAL RASH, SWELLING OR PAIN  UNUSUAL VAGINAL DISCHARGE OR ITCHING   Items with * indicate a potential emergency and should be followed up as soon as possible or go to the Emergency Department if any problems should occur.  Please show the CHEMOTHERAPY ALERT CARD or IMMUNOTHERAPY ALERT CARD at check-in to the  Emergency Department and triage nurse.  Should you have questions after your visit or need to cancel or reschedule your appointment, please contact [facility]  Dept: [phone]  and follow the prompts.  Office hours are 8:00 a.m. to 4:30 p.m. Monday - Friday. Please note that voicemails left after 4:00 p.m. may not be returned until the following business day.  We are closed weekends and major holidays. You have access to a nurse at all times for urgent questions. Please call the main number to the clinic Dept: [phone] and follow the prompts.   For any non-urgent questions, you may also contact your provider using MyChart. We now offer e-Visits for anyone 34 and older to request care online for non-urgent symptoms. For details visit [email or web address].   Also download the MyChart app! Go to the app store, search "MyChart", open the app, select [facility], and log in with your MyChart username and password.  Due to Covid, a mask is required upon entering the hospital/clinic. If you do not have a mask, one will be given to you upon arrival. For doctor visits, patients may have 1 support person aged 64 or older with them. For treatment visits, patients cannot have anyone with them due to current Covid guidelines and our immunocompromised population.

## 2022-01-05 NOTE — Progress Notes (Signed)
Per Dr. Lindi Adie ok to treat today with ANC of 1.1

## 2022-01-06 ENCOUNTER — Inpatient Hospital Stay: Payer: 59

## 2022-01-06 VITALS — BP 116/65 | HR 93 | Temp 98.6°F | Resp 18

## 2022-01-06 DIAGNOSIS — Z171 Estrogen receptor negative status [ER-]: Secondary | ICD-10-CM

## 2022-01-06 DIAGNOSIS — Z5112 Encounter for antineoplastic immunotherapy: Secondary | ICD-10-CM | POA: Diagnosis not present

## 2022-01-06 DIAGNOSIS — C50411 Malignant neoplasm of upper-outer quadrant of right female breast: Secondary | ICD-10-CM

## 2022-01-06 LAB — T4: T4, Total: 9.6 ug/dL (ref 4.5–12.0)

## 2022-01-06 MED ORDER — FILGRASTIM-SNDZ 480 MCG/0.8ML IJ SOSY
480.0000 ug | PREFILLED_SYRINGE | Freq: Once | INTRAMUSCULAR | Status: AC
  Administered 2022-01-06: 480 ug via SUBCUTANEOUS

## 2022-01-06 NOTE — Patient Instructions (Signed)
Filgrastim, G-CSF injection °What is this medication? °FILGRASTIM, G-CSF (fil GRA stim) is a granulocyte colony-stimulating factor that stimulates the growth of neutrophils, a type of white blood cell (WBC) important in the body's fight against infection. It is used to reduce the incidence of fever and infection in patients with certain types of cancer who are receiving chemotherapy that affects the bone marrow, to stimulate blood cell production for removal of WBCs from the body prior to a bone marrow transplantation, to reduce the incidence of fever and infection in patients who have severe chronic neutropenia, and to improve survival outcomes following high-dose radiation exposure that is toxic to the bone marrow. °This medicine may be used for other purposes; ask your health care provider or pharmacist if you have questions. °COMMON BRAND NAME(S): Neupogen, Nivestym, Releuko, Zarxio °What should I tell my care team before I take this medication? °They need to know if you have any of these conditions: °kidney disease °latex allergy °ongoing radiation therapy °sickle cell disease °an unusual or allergic reaction to filgrastim, pegfilgrastim, other medicines, foods, dyes, or preservatives °pregnant or trying to get pregnant °breast-feeding °How should I use this medication? °This medicine is for injection under the skin or infusion into a vein. As an infusion into a vein, it is usually given by a health care professional in a hospital or clinic setting. If you get this medicine at home, you will be taught how to prepare and give this medicine. Refer to the Instructions for Use that come with your medication packaging. Use exactly as directed. Take your medicine at regular intervals. Do not take your medicine more often than directed. °It is important that you put your used needles and syringes in a special sharps container. Do not put them in a trash can. If you do not have a sharps container, call your pharmacist  or healthcare provider to get one. °Talk to your pediatrician regarding the use of this medicine in children. While this drug may be prescribed for children as young as 7 months for selected conditions, precautions do apply. °Overdosage: If you think you have taken too much of this medicine contact a poison control center or emergency room at once. °NOTE: This medicine is only for you. Do not share this medicine with others. °What if I miss a dose? °It is important not to miss your dose. Call your doctor or health care professional if you miss a dose. °What may interact with this medication? °This medicine may interact with the following medications: °medicines that may cause a release of neutrophils, such as lithium °This list may not describe all possible interactions. Give your health care provider a list of all the medicines, herbs, non-prescription drugs, or dietary supplements you use. Also tell them if you smoke, drink alcohol, or use illegal drugs. Some items may interact with your medicine. °What should I watch for while using this medication? °Your condition will be monitored carefully while you are receiving this medicine. °You may need blood work done while you are taking this medicine. °Talk to your health care provider about your risk of cancer. You may be more at risk for certain types of cancer if you take this medicine. °What side effects may I notice from receiving this medication? °Side effects that you should report to your doctor or health care professional as soon as possible: °allergic reactions like skin rash, itching or hives, swelling of the face, lips, or tongue °back pain °dizziness or feeling faint °fever °pain, redness, or   irritation at site where injected °pinpoint red spots on the skin °shortness of breath or breathing problems °signs and symptoms of kidney injury like trouble passing urine, change in the amount of urine, or red or dark-brown urine °stomach or side pain, or pain at  the shoulder °swelling °tiredness °unusual bleeding or bruising °Side effects that usually do not require medical attention (report to your doctor or health care professional if they continue or are bothersome): °bone pain °cough °diarrhea °hair loss °headache °muscle pain °This list may not describe all possible side effects. Call your doctor for medical advice about side effects. You may report side effects to FDA at 1-800-FDA-1088. °Where should I keep my medication? °Keep out of the reach of children. °Store in a refrigerator between 2 and 8 degrees C (36 and 46 degrees F). Do not freeze. Keep in carton to protect from light. Throw away this medicine if vials or syringes are left out of the refrigerator for more than 24 hours. Throw away any unused medicine after the expiration date. °NOTE: This sheet is a summary. It may not cover all possible information. If you have questions about this medicine, talk to your doctor, pharmacist, or health care provider. °© 2022 [employer] (2021-09-01 00:00:00) ° °

## 2022-01-07 ENCOUNTER — Inpatient Hospital Stay: Payer: 59

## 2022-01-08 ENCOUNTER — Other Ambulatory Visit: Payer: Self-pay

## 2022-01-08 ENCOUNTER — Inpatient Hospital Stay: Payer: 59

## 2022-01-08 VITALS — BP 116/55 | HR 75 | Temp 98.1°F | Resp 18

## 2022-01-08 DIAGNOSIS — Z5112 Encounter for antineoplastic immunotherapy: Secondary | ICD-10-CM | POA: Diagnosis not present

## 2022-01-08 DIAGNOSIS — Z171 Estrogen receptor negative status [ER-]: Secondary | ICD-10-CM

## 2022-01-08 MED ORDER — FILGRASTIM-SNDZ 480 MCG/0.8ML IJ SOSY
480.0000 ug | PREFILLED_SYRINGE | Freq: Once | INTRAMUSCULAR | Status: AC
  Administered 2022-01-08: 480 ug via SUBCUTANEOUS
  Filled 2022-01-08: qty 0.8

## 2022-01-11 NOTE — Assessment & Plan Note (Signed)
11/04/2021:Patient has been breast-feeding. Felt a painful right breast mass and an axillary mass. 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 with a Ki-67 of 40%, 1 abnormal lymph node 5.8 cm biopsy: Grade 3 IDC ER 5%, PR 0%, HER2 negative, Ki-67 40%  CT CAP 11/20/2021: Right breast cancer with axillary lymph nodes, mildly enlarged right retropectoral lymph nodes, right internal mammary lymph nodes Breast MRI 11/17/2021: Known breast cancer 2 cm, right axillary lymph node, non-mass enhancement 7 cm, borderline enlarged right internal mammary lymph nodes  Treatment Plan: based on multidisciplinary tumor board: 1. Neoadjuvant chemotherapy with Taxol weekly 12 withKeytruda andcarboplatin every 3 weeks x4 Adriamycin and Cytoxan, Keytrudadose dense 4 followed by followed by Beryle Flock maintenance for 1 year 2. Followed by mastectomy with targeted axillary dissection 3. Followed by adjuvant radiation therapy ---------------------------------------------------------------------------------------------------------------------------  Current Treatment: Cycle 2 day 8 Taxol carboplatin and Keytruda  Labs reviewed, chemo education completed    Chemo Toxicities:   RTC to see me weekly on chemo and every other week for follow up with me

## 2022-01-11 NOTE — Progress Notes (Signed)
Patient Care Team: [name], MD as PCP - General (Family Medicine)  DIAGNOSIS:    ICD-10-CM   1. Malignant neoplasm of upper-outer quadrant of right breast in female, estrogen receptor negative ([place])  C50.411    Z17.1       SUMMARY OF ONCOLOGIC HISTORY: Oncology History  Malignant neoplasm of upper-outer quadrant of right breast in female, estrogen receptor negative ([place])  11/04/2021 Initial Diagnosis    Patient has been breast-feeding.  Felt a painful right breast mass and an axillary mass.  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 with a Ki-67 of 40%, 1 abnormal lymph node 5.8 cm biopsy: Grade 3 IDC ER 5%, PR 0%, HER2 negative, Ki-67 40%   11/11/2021 Cancer Staging   Staging form: Breast, AJCC 8th Edition - Clinical stage from 11/11/2021: Stage IIB (cT2, cN1, cM0, G3, ER+, PR+, HER2-) - Signed by Nicholas Lose, MD on 11/11/2021 Stage prefix: Initial diagnosis Histologic grading system: 3 grade system    11/30/2021 -  Chemotherapy   Patient is on Treatment Plan : BREAST Pembrolizumab + Carboplatin D1 + Paclitaxel D1,8,15 q21d X 4 cycles / Pembrolizumab + AC q21d x 4 cycles     11/30/2021 Genetic Testing   Negative genetic testing on the CancerNext-Expanded+RNA insight panel.  The report date is 11/30/2021  The CancerNext-Expanded gene panel offered by [facility] and includes sequencing and rearrangement analysis for the following 77 genes: AIP, ALK, APC*, ATM*, AXIN2, BAP1, BARD1, BLM, BMPR1A, BRCA1*, BRCA2*, BRIP1*, CDC73, CDH1*, CDK4, CDKN1B, CDKN2A, CHEK2*, CTNNA1, DICER1, FANCC, FH, FLCN, GALNT12, KIF1B, LZTR1, MAX, MEN1, MET, MLH1*, MSH2*, MSH3, MSH6*, MUTYH*, NBN, NF1*, NF2, NTHL1, PALB2*, PHOX2B, PMS2*, POT1, PRKAR1A, PTCH1, PTEN*, RAD51C*, RAD51D*, RB1, RECQL, RET, SDHA, SDHAF2, SDHB, SDHC, SDHD, SMAD4, SMARCA4, SMARCB1, SMARCE1, STK11, SUFU, TMEM127, TP53*, TSC1, TSC2, VHL and XRCC2 (sequencing and deletion/duplication);  EGFR, EGLN1, HOXB13, KIT, MITF, PDGFRA, POLD1, and POLE (sequencing only); EPCAM and GREM1 (deletion/duplication only). DNA and RNA analyses performed for * genes.      CHIEF COMPLIANT: Follow-up of right breast cancer  INTERVAL HISTORY: Sydney Rios is a 41 y.o. with above-mentioned history of right breast cancer, currently on neoadjuvant chemotherapy with Taxol. She presents to the clinic today for toxicity check.   ALLERGIES:  has No Known Allergies.  MEDICATIONS:  Current Outpatient Medications  Medication Sig Dispense Refill   Ascorbic Acid (VITAMIN C) 1000 MG tablet Take 1,000 mg by mouth 2 (two) times daily as needed (immune support (if sick)).     Cholecalciferol (VITAMIN D3) 125 MCG (5000 UT) TABS Take 5,000 Units by mouth daily as needed (immune support (if sick)).     IVERMECTIN PO Take 20 mg by mouth in the morning.     lidocaine-prilocaine (EMLA) cream Apply to affected area once 30 g 3   Menaquinone-7 (VITAMIN K2) 100 MCG CAPS Take 100 mcg by mouth daily as needed (immune support (if sick)).     NALTREXONE HCL PO Take 1 mg by mouth in the morning.     ondansetron (ZOFRAN) 8 MG tablet Take 1 tablet (8 mg total) by mouth 2 (two) times daily as needed. Start on the third day after carboplatin and AC chemotherapy. 30 tablet 1   [START ON 03/10/2022] pegfilgrastim-bmez (ZIEXTENZO) 6 MG/0.6ML injection Inject 0.6 mLs (6 mg total) into the skin once for 1 dose. 0.6 mL 2   prochlorperazine (COMPAZINE) 10 MG tablet Take 1 tablet (10 mg total)  by mouth every 6 (six) hours as needed (Nausea or vomiting). 30 tablet 1   thyroid (ARMOUR) 30 MG tablet Take 30 mg by mouth daily before breakfast.     valACYclovir (VALTREX) 1000 MG tablet Take 2 g by mouth 2 (two) times daily as needed (cold sore/fever blisters).     Zinc 50 MG TABS Take 50 mg by mouth daily as needed.     No current facility-administered medications for this visit.    PHYSICAL EXAMINATION: ECOG PERFORMANCE STATUS: 1 -  Symptomatic but completely ambulatory  Vitals:   01/12/22 1058  BP: 116/70  Pulse: 91  Resp: 18  Temp: 97.7 F (36.5 C)  SpO2: 100%   Filed Weights   01/12/22 1058  Weight: 151 lb 6.4 oz (68.7 kg)    BREAST: No palpable masses or nodules in either right or left breasts. No palpable axillary supraclavicular or infraclavicular adenopathy no breast tenderness or nipple discharge. (exam performed in the presence of a chaperone)  LABORATORY DATA:  I have reviewed the data as listed CMP Latest Ref Rng & Units 01/05/2022 12/29/2021 12/24/2021  Glucose 70 - 99 mg/dL 133(H) 102(H) 111(H)  BUN 6 - 20 mg/dL _0 Creatinine 0.44 - 1.00 mg/dL 0.56 0.49 0.57  Sodium 135 - 145 mmol/L 138 139 141  Potassium 3.5 - 5.1 mmol/L 4.0 4.3 3.9  Chloride 98 - 111 mmol/L 106 107 109  CO2 22 - 32 mmol/L _1 Calcium 8.9 - 10.3 mg/dL 9.1 9.4 9.3  Total Protein 6.5 - 8.1 g/dL 6.2(L) 6.6 6.3(L)  Total Bilirubin 0.3 - 1.2 mg/dL 0.1(L) 0.2(L) 0.8  Alkaline Phos 38 - 126 U/L 47 48 38  AST 15 - 41 U/L 13(L) 13(L) 17  ALT 0 - 44 U/L 13 14 35    Lab Results  Component Value Date   WBC 3.1 (L) 01/12/2022   HGB 9.8 (L) 01/12/2022   HCT 30.1 (L) 01/12/2022   MCV 93.2 01/12/2022   PLT 199 01/12/2022   NEUTROABS 1.8 01/12/2022    ASSESSMENT & PLAN:  Malignant neoplasm of upper-outer quadrant of right breast in female, estrogen receptor negative ([place]) 11/04/2021: Patient has been breast-feeding.  Felt a painful right breast mass and an axillary mass.  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 with a Ki-67 of 40%, 1 abnormal lymph node 5.8 cm biopsy: Grade 3 IDC ER 5%, PR 0%, HER2 negative, Ki-67 40%   CT CAP 11/20/2021: Right breast cancer with axillary lymph nodes, mildly enlarged right retropectoral lymph nodes, right internal mammary lymph nodes Breast MRI 11/17/2021: Known breast cancer 2 cm, right axillary lymph node, non-mass enhancement 7 cm,  borderline enlarged right internal mammary lymph nodes   Treatment Plan: based on multidisciplinary tumor board: 1. Neoadjuvant chemotherapy with Taxol weekly 12 with Keytruda and carboplatin every 3 weeks x4 Adriamycin and Cytoxan, Keytruda dose dense 4 followed by  followed by Beryle Flock maintenance for 1 year 2. Followed by mastectomy with targeted axillary dissection 3. Followed by adjuvant radiation therapy  ---------------------------------------------------------------------------------------------------------------------------   Current Treatment: Cycle 7 Taxol carboplatin and Keytruda  Labs reviewed   Chemo Toxicities: Denies peripheral neuropathy Mild to moderate fatigue Denies any nausea or vomiting Cytopenias: ANC today 1.8, hemoglobin 9.8: Currently is receiving G-CSF injections after each cycle of chemo.  Once she finishes weekly Taxol treatments then 2 weeks later we will start her systemic therapy with Adriamycin and Cytoxan and Keytruda.  RTC to see me weekly on chemo and every other week for follow up with me    No orders of the defined types were placed in this encounter.  The patient has a good understanding of the overall plan. she agrees with it. she will call with any problems that may develop before the next visit here.  Total time spent: 30 mins including face to face time and time spent for planning, charting and coordination of care  Rulon Eisenmenger, MD, MPH 01/12/2022  I, Thana Ates, am acting as scribe for Dr. Nicholas Lose.  I have reviewed the above documentation for accuracy and completeness, and I agree with the above.

## 2022-01-12 ENCOUNTER — Inpatient Hospital Stay: Payer: 59

## 2022-01-12 ENCOUNTER — Other Ambulatory Visit: Payer: Self-pay

## 2022-01-12 ENCOUNTER — Inpatient Hospital Stay: Payer: 59 | Admitting: Hematology and Oncology

## 2022-01-12 DIAGNOSIS — C50411 Malignant neoplasm of upper-outer quadrant of right female breast: Secondary | ICD-10-CM

## 2022-01-12 DIAGNOSIS — Z171 Estrogen receptor negative status [ER-]: Secondary | ICD-10-CM

## 2022-01-12 DIAGNOSIS — Z5112 Encounter for antineoplastic immunotherapy: Secondary | ICD-10-CM | POA: Diagnosis not present

## 2022-01-12 DIAGNOSIS — Z95828 Presence of other vascular implants and grafts: Secondary | ICD-10-CM

## 2022-01-12 LAB — CMP (CANCER CENTER ONLY)
ALT: 15 U/L (ref 0–44)
AST: 13 U/L — ABNORMAL LOW (ref 15–41)
Albumin: 3.9 g/dL (ref 3.5–5.0)
Alkaline Phosphatase: 54 U/L (ref 38–126)
Anion gap: 7 (ref 5–15)
BUN: 8 mg/dL (ref 6–20)
CO2: 27 mmol/L (ref 22–32)
Calcium: 9.3 mg/dL (ref 8.9–10.3)
Chloride: 108 mmol/L (ref 98–111)
Creatinine: 0.64 mg/dL (ref 0.44–1.00)
GFR, Estimated: >60 mL/min (ref >60)
Glucose, Bld: 129 mg/dL — ABNORMAL HIGH (ref 70–99)
Potassium: 3.8 mmol/L (ref 3.5–5.1)
Sodium: 142 mmol/L (ref 135–145)
Total Bilirubin: 0.2 mg/dL — ABNORMAL LOW (ref 0.3–1.2)
Total Protein: 6.2 g/dL — ABNORMAL LOW (ref 6.5–8.1)

## 2022-01-12 LAB — CBC WITH DIFFERENTIAL (CANCER CENTER ONLY)
Abs Immature Granulocytes: 0.05 10*3/uL (ref 0.00–0.07)
Basophils Absolute: 0 10*3/uL (ref 0.0–0.1)
Basophils Relative: 0 %
Eosinophils Absolute: 0 10*3/uL (ref 0.0–0.5)
Eosinophils Relative: 0 %
HCT: 30.1 % — ABNORMAL LOW (ref 36.0–46.0)
Hemoglobin: 9.8 g/dL — ABNORMAL LOW (ref 12.0–15.0)
Immature Granulocytes: 2 %
Lymphocytes Relative: 33 %
Lymphs Abs: 1 10*3/uL (ref 0.7–4.0)
MCH: 30.3 pg (ref 26.0–34.0)
MCHC: 32.6 g/dL (ref 30.0–36.0)
MCV: 93.2 fL (ref 80.0–100.0)
Monocytes Absolute: 0.3 10*3/uL (ref 0.1–1.0)
Monocytes Relative: 9 %
Neutro Abs: 1.8 10*3/uL (ref 1.7–7.7)
Neutrophils Relative %: 56 %
Platelet Count: 199 10*3/uL (ref 150–400)
RBC: 3.23 MIL/uL — ABNORMAL LOW (ref 3.87–5.11)
RDW: 16.4 % — ABNORMAL HIGH (ref 11.5–15.5)
WBC Count: 3.1 10*3/uL — ABNORMAL LOW (ref 4.0–10.5)
nRBC: 0 % (ref 0.0–0.2)

## 2022-01-12 LAB — TSH: TSH: <0.08 u[IU]/mL — ABNORMAL LOW (ref 0.308–3.960)

## 2022-01-12 MED ORDER — SODIUM CHLORIDE 0.9 % IV SOLN
600.0000 mg | Freq: Once | INTRAVENOUS | Status: AC
  Administered 2022-01-12: 600 mg via INTRAVENOUS
  Filled 2022-01-12: qty 60

## 2022-01-12 MED ORDER — SODIUM CHLORIDE 0.9% FLUSH
10.0000 mL | Freq: Once | INTRAVENOUS | Status: AC
  Administered 2022-01-12: 10 mL

## 2022-01-12 MED ORDER — SODIUM CHLORIDE 0.9% FLUSH
10.0000 mL | INTRAVENOUS | Status: DC | PRN
  Administered 2022-01-12: 10 mL

## 2022-01-12 MED ORDER — SODIUM CHLORIDE 0.9 % IV SOLN
150.0000 mg | Freq: Once | INTRAVENOUS | Status: AC
  Administered 2022-01-12: 150 mg via INTRAVENOUS
  Filled 2022-01-12: qty 150

## 2022-01-12 MED ORDER — FAMOTIDINE 20 MG IN NS 100 ML IVPB
20.0000 mg | Freq: Once | INTRAVENOUS | Status: AC
  Administered 2022-01-12: 20 mg via INTRAVENOUS
  Filled 2022-01-12: qty 100

## 2022-01-12 MED ORDER — CLINDAMYCIN PHOSPHATE 1 % EX GEL: Freq: Two times a day (BID) | CUTANEOUS | 0 refills | Status: DC

## 2022-01-12 MED ORDER — SODIUM CHLORIDE 0.9 % IV SOLN
60.0000 mg/m2 | Freq: Once | INTRAVENOUS | Status: AC
  Administered 2022-01-12: 108 mg via INTRAVENOUS
  Filled 2022-01-12: qty 18

## 2022-01-12 MED ORDER — SODIUM CHLORIDE 0.9 % IV SOLN
200.0000 mg | Freq: Once | INTRAVENOUS | Status: AC
  Administered 2022-01-12: 200 mg via INTRAVENOUS
  Filled 2022-01-12: qty 200

## 2022-01-12 MED ORDER — SODIUM CHLORIDE 0.9 % IV SOLN: Freq: Once | INTRAVENOUS | Status: AC

## 2022-01-12 MED ORDER — DIPHENHYDRAMINE HCL 50 MG/ML IJ SOLN
12.5000 mg | Freq: Once | INTRAMUSCULAR | Status: AC
  Administered 2022-01-12: 12.5 mg via INTRAVENOUS
  Filled 2022-01-12: qty 1

## 2022-01-12 MED ORDER — PALONOSETRON HCL INJECTION 0.25 MG/5ML
0.2500 mg | Freq: Once | INTRAVENOUS | Status: AC
  Administered 2022-01-12: 0.25 mg via INTRAVENOUS
  Filled 2022-01-12: qty 5

## 2022-01-12 MED ORDER — SODIUM CHLORIDE 0.9 % IV SOLN
10.0000 mg | Freq: Once | INTRAVENOUS | Status: AC
  Administered 2022-01-12: 10 mg via INTRAVENOUS
  Filled 2022-01-12: qty 10

## 2022-01-12 MED ORDER — HEPARIN SOD (PORK) LOCK FLUSH 100 UNIT/ML IV SOLN
500.0000 [IU] | Freq: Once | INTRAVENOUS | Status: AC | PRN
  Administered 2022-01-12: 500 [IU]

## 2022-01-12 NOTE — Patient Instructions (Signed)
[facility]COLOG[facility]   Discharge Instructions: Thank you for choosing [facility] to provide your oncology and hematology care.   If you have a lab appointment with the Cancer Center, please go directly to the Cancer Center and check in at the registration area.   Wear comfortable clothing and clothing appropriate for easy access to any Portacath or PICC line.   We strive to give you quality time with your provider. You may need to reschedule your appointment if you arrive late (15 or more minutes).  Arriving late affects you and other patients whose appointments are after yours.  Also, if you miss three or more appointments without notifying the office, you may be dismissed from the clinic at the provider's discretion.      For prescription refill requests, have your pharmacy contact our office and allow 72 hours for refills to be completed.    Today you received the following chemotherapy and/or immunotherapy agents keytruda, paclitaxel, carboplatin      To help prevent nausea and vomiting after your treatment, we encourage you to take your nausea medication as directed.  BELOW ARE SYMPTOMS THAT SHOULD BE REPORTED IMMEDIATELY: *FEVER GREATER THAN 100.4 F (38 C) OR HIGHER *CHILLS OR SWEATING *NAUSEA AND VOMITING THAT IS NOT CONTROLLED WITH YOUR NAUSEA MEDICATION *UNUSUAL SHORTNESS OF BREATH *UNUSUAL BRUISING OR BLEEDING *URINARY PROBLEMS (pain or burning when urinating, or frequent urination) *BOWEL PROBLEMS (unusual diarrhea, constipation, pain near the anus) TENDERNESS IN MOUTH AND THROAT WITH OR WITHOUT PRESENCE OF ULCERS (sore throat, sores in mouth, or a toothache) UNUSUAL RASH, SWELLING OR PAIN  UNUSUAL VAGINAL DISCHARGE OR ITCHING   Items with * indicate a potential emergency and should be followed up as soon as possible or go to the Emergency Department if any problems should occur.  Please show the CHEMOTHERAPY ALERT CARD or IMMUNOTHERAPY  ALERT CARD at check-in to the Emergency Department and triage nurse.  Should you have questions after your visit or need to cancel or reschedule your appointment, please contact [facility]  Dept: [phone]  and follow the prompts.  Office hours are 8:00 a.m. to 4:30 p.m. Monday - Friday. Please note that voicemails left after 4:00 p.m. may not be returned until the following business day.  We are closed weekends and major holidays. You have access to a nurse at all times for urgent questions. Please call the main number to the clinic Dept: [phone] and follow the prompts.   For any non-urgent questions, you may also contact your provider using MyChart. We now offer e-Visits for anyone 18 and older to request care online for non-urgent symptoms. For details visit [email or web address].   Also download the MyChart app! Go to the app store, search "MyChart", open the app, select [facility], and log in with your MyChart username and password.  Due to Covid, a mask is required upon entering the hospital/clinic. If you do not have a mask, one will be given to you upon arrival. For doctor visits, patients may have 1 support person aged 18 or older with them. For treatment visits, patients cannot have anyone with them due to current Covid guidelines and our immunocompromised population.   

## 2022-01-13 ENCOUNTER — Inpatient Hospital Stay: Payer: 59

## 2022-01-13 ENCOUNTER — Ambulatory Visit (HOSPITAL_COMMUNITY)
Admission: RE | Admit: 2022-01-13 | Discharge: 2022-01-13 | Disposition: A | Discharge status: Home / Self Care | Payer: 59 | Source: Ambulatory Visit | Attending: Adult Health | Admitting: Adult Health

## 2022-01-13 VITALS — BP 122/61 | HR 66 | Temp 98.2°F | Resp 18

## 2022-01-13 DIAGNOSIS — Z95828 Presence of other vascular implants and grafts: Secondary | ICD-10-CM

## 2022-01-13 DIAGNOSIS — Z5112 Encounter for antineoplastic immunotherapy: Secondary | ICD-10-CM | POA: Diagnosis not present

## 2022-01-13 DIAGNOSIS — Z171 Estrogen receptor negative status [ER-]: Secondary | ICD-10-CM | POA: Insufficient documentation

## 2022-01-13 DIAGNOSIS — C50411 Malignant neoplasm of upper-outer quadrant of right female breast: Secondary | ICD-10-CM | POA: Diagnosis present

## 2022-01-13 LAB — T4: T4, Total: 6.4 ug/dL (ref 4.5–12.0)

## 2022-01-13 MED ORDER — FILGRASTIM-SNDZ 480 MCG/0.8ML IJ SOSY
480.0000 ug | PREFILLED_SYRINGE | Freq: Once | INTRAMUSCULAR | Status: AC
  Administered 2022-01-13: 480 ug via SUBCUTANEOUS
  Filled 2022-01-13: qty 0.8

## 2022-01-14 ENCOUNTER — Inpatient Hospital Stay: Payer: 59

## 2022-01-14 ENCOUNTER — Other Ambulatory Visit: Payer: Self-pay

## 2022-01-14 VITALS — BP 114/58 | HR 76 | Temp 98.5°F | Resp 16

## 2022-01-14 DIAGNOSIS — Z95828 Presence of other vascular implants and grafts: Secondary | ICD-10-CM

## 2022-01-14 DIAGNOSIS — Z5112 Encounter for antineoplastic immunotherapy: Secondary | ICD-10-CM | POA: Diagnosis not present

## 2022-01-14 MED ORDER — FILGRASTIM-SNDZ 480 MCG/0.8ML IJ SOSY
480.0000 ug | PREFILLED_SYRINGE | Freq: Once | INTRAMUSCULAR | Status: AC
  Administered 2022-01-14: 480 ug via SUBCUTANEOUS
  Filled 2022-01-14: qty 0.8

## 2022-01-15 ENCOUNTER — Inpatient Hospital Stay: Payer: 59

## 2022-01-15 VITALS — BP 103/54 | HR 74 | Temp 98.4°F | Resp 16

## 2022-01-15 DIAGNOSIS — Z5112 Encounter for antineoplastic immunotherapy: Secondary | ICD-10-CM | POA: Diagnosis not present

## 2022-01-15 DIAGNOSIS — Z95828 Presence of other vascular implants and grafts: Secondary | ICD-10-CM

## 2022-01-15 MED ORDER — FILGRASTIM-SNDZ 480 MCG/0.8ML IJ SOSY
480.0000 ug | PREFILLED_SYRINGE | Freq: Once | INTRAMUSCULAR | Status: AC
  Administered 2022-01-15: 480 ug via SUBCUTANEOUS
  Filled 2022-01-15: qty 0.8

## 2022-01-18 MED FILL — Dexamethasone Sodium Phosphate Inj 100 MG/10ML: INTRAMUSCULAR | Qty: 1 | Status: AC

## 2022-01-19 ENCOUNTER — Inpatient Hospital Stay: Payer: 59

## 2022-01-19 ENCOUNTER — Other Ambulatory Visit: Payer: Self-pay

## 2022-01-19 ENCOUNTER — Ambulatory Visit: Payer: Managed Care, Other (non HMO)

## 2022-01-19 VITALS — BP 115/61 | HR 72 | Temp 98.2°F | Resp 17 | Wt 152.0 lb

## 2022-01-19 DIAGNOSIS — Z95828 Presence of other vascular implants and grafts: Secondary | ICD-10-CM

## 2022-01-19 DIAGNOSIS — C50411 Malignant neoplasm of upper-outer quadrant of right female breast: Secondary | ICD-10-CM

## 2022-01-19 DIAGNOSIS — Z5112 Encounter for antineoplastic immunotherapy: Secondary | ICD-10-CM | POA: Diagnosis not present

## 2022-01-19 DIAGNOSIS — Z171 Estrogen receptor negative status [ER-]: Secondary | ICD-10-CM

## 2022-01-19 LAB — CMP (CANCER CENTER ONLY)
ALT: 15 U/L (ref 0–44)
AST: 15 U/L (ref 15–41)
Albumin: 4 g/dL (ref 3.5–5.0)
Alkaline Phosphatase: 61 U/L (ref 38–126)
Anion gap: 5 (ref 5–15)
BUN: 20 mg/dL (ref 6–20)
CO2: 30 mmol/L (ref 22–32)
Calcium: 9.9 mg/dL (ref 8.9–10.3)
Chloride: 104 mmol/L (ref 98–111)
Creatinine: 0.64 mg/dL (ref 0.44–1.00)
GFR, Estimated: >60 mL/min (ref >60)
Glucose, Bld: 98 mg/dL (ref 70–99)
Potassium: 4.1 mmol/L (ref 3.5–5.1)
Sodium: 139 mmol/L (ref 135–145)
Total Bilirubin: 0.2 mg/dL — ABNORMAL LOW (ref 0.3–1.2)
Total Protein: 6.2 g/dL — ABNORMAL LOW (ref 6.5–8.1)

## 2022-01-19 LAB — CBC WITH DIFFERENTIAL (CANCER CENTER ONLY)
Abs Immature Granulocytes: 0.03 10*3/uL (ref 0.00–0.07)
Basophils Absolute: 0 10*3/uL (ref 0.0–0.1)
Basophils Relative: 1 %
Eosinophils Absolute: 0 10*3/uL (ref 0.0–0.5)
Eosinophils Relative: 0 %
HCT: 29.5 % — ABNORMAL LOW (ref 36.0–46.0)
Hemoglobin: 9.8 g/dL — ABNORMAL LOW (ref 12.0–15.0)
Immature Granulocytes: 1 %
Lymphocytes Relative: 31 %
Lymphs Abs: 1 10*3/uL (ref 0.7–4.0)
MCH: 30.7 pg (ref 26.0–34.0)
MCHC: 33.2 g/dL (ref 30.0–36.0)
MCV: 92.5 fL (ref 80.0–100.0)
Monocytes Absolute: 0.3 10*3/uL (ref 0.1–1.0)
Monocytes Relative: 10 %
Neutro Abs: 1.9 10*3/uL (ref 1.7–7.7)
Neutrophils Relative %: 57 %
Platelet Count: 219 10*3/uL (ref 150–400)
RBC: 3.19 MIL/uL — ABNORMAL LOW (ref 3.87–5.11)
RDW: 16.6 % — ABNORMAL HIGH (ref 11.5–15.5)
WBC Count: 3.3 10*3/uL — ABNORMAL LOW (ref 4.0–10.5)
nRBC: 0 % (ref 0.0–0.2)

## 2022-01-19 LAB — TSH: TSH: 6.371 u[IU]/mL — ABNORMAL HIGH (ref 0.308–3.960)

## 2022-01-19 MED ORDER — SODIUM CHLORIDE 0.9% FLUSH
10.0000 mL | Freq: Once | INTRAVENOUS | Status: AC
  Administered 2022-01-19: 12:00:00 10 mL

## 2022-01-19 MED ORDER — HEPARIN SOD (PORK) LOCK FLUSH 100 UNIT/ML IV SOLN
500.0000 [IU] | Freq: Once | INTRAVENOUS | Status: AC | PRN
  Administered 2022-01-19: 15:00:00 500 [IU]

## 2022-01-19 MED ORDER — SODIUM CHLORIDE 0.9 % IV SOLN
60.0000 mg/m2 | Freq: Once | INTRAVENOUS | Status: AC
  Administered 2022-01-19: 14:00:00 108 mg via INTRAVENOUS
  Filled 2022-01-19: qty 18

## 2022-01-19 MED ORDER — SODIUM CHLORIDE 0.9 % IV SOLN: Freq: Once | INTRAVENOUS | Status: AC

## 2022-01-19 MED ORDER — DIPHENHYDRAMINE HCL 50 MG/ML IJ SOLN
12.5000 mg | Freq: Once | INTRAMUSCULAR | Status: AC
  Administered 2022-01-19: 12:00:00 12.5 mg via INTRAVENOUS
  Filled 2022-01-19: qty 1

## 2022-01-19 MED ORDER — SODIUM CHLORIDE 0.9% FLUSH
10.0000 mL | INTRAVENOUS | Status: DC | PRN
  Administered 2022-01-19: 15:00:00 10 mL

## 2022-01-19 MED ORDER — FAMOTIDINE 20 MG IN NS 100 ML IVPB
20.0000 mg | Freq: Once | INTRAVENOUS | Status: AC
  Administered 2022-01-19: 13:00:00 20 mg via INTRAVENOUS
  Filled 2022-01-19: qty 100

## 2022-01-19 MED ORDER — SODIUM CHLORIDE 0.9 % IV SOLN
10.0000 mg | Freq: Once | INTRAVENOUS | Status: AC
  Administered 2022-01-19: 12:00:00 10 mg via INTRAVENOUS
  Filled 2022-01-19: qty 10

## 2022-01-19 NOTE — Patient Instructions (Signed)
[facility] ONCOLOGY  Discharge Instructions: Thank you for choosing [facility] to provide your oncology and hematology care.   If you have a lab appointment with the [facility], please go directly to the [facility] and check in at the registration area.   Wear comfortable clothing and clothing appropriate for easy access to any Portacath or PICC line.   We strive to give you quality time with your provider. You may need to reschedule your appointment if you arrive late (15 or more minutes).  Arriving late affects you and other patients whose appointments are after yours.  Also, if you miss three or more appointments without notifying the office, you may be dismissed from the clinic at the providers discretion.      For prescription refill requests, have your pharmacy contact our office and allow 72 hours for refills to be completed.    Today you received the following chemotherapy and/or immunotherapy agents: Paclitaxel      To help prevent nausea and vomiting after your treatment, we encourage you to take your nausea medication as directed.  BELOW ARE SYMPTOMS THAT SHOULD BE REPORTED IMMEDIATELY: *FEVER GREATER THAN 100.4 F (38 C) OR HIGHER *CHILLS OR SWEATING *NAUSEA AND VOMITING THAT IS NOT CONTROLLED WITH YOUR NAUSEA MEDICATION *UNUSUAL SHORTNESS OF BREATH *UNUSUAL BRUISING OR BLEEDING *URINARY PROBLEMS (pain or burning when urinating, or frequent urination) *BOWEL PROBLEMS (unusual diarrhea, constipation, pain near the anus) TENDERNESS IN MOUTH AND THROAT WITH OR WITHOUT PRESENCE OF ULCERS (sore throat, sores in mouth, or a toothache) UNUSUAL RASH, SWELLING OR PAIN  UNUSUAL VAGINAL DISCHARGE OR ITCHING   Items with * indicate a potential emergency and should be followed up as soon as possible or go to the Emergency Department if any problems should occur.  Please show the CHEMOTHERAPY ALERT CARD or IMMUNOTHERAPY ALERT CARD at check-in to  the Emergency Department and triage nurse.  Should you have questions after your visit or need to cancel or reschedule your appointment, please contact [facility]  Dept: [phone]  and follow the prompts.  Office hours are 8:00 a.m. to 4:30 p.m. Monday - Friday. Please note that voicemails left after 4:00 p.m. may not be returned until the following business day.  We are closed weekends and major holidays. You have access to a nurse at all times for urgent questions. Please call the main number to the clinic Dept: [phone] and follow the prompts.   For any non-urgent questions, you may also contact your provider using MyChart. We now offer e-Visits for anyone 37 and older to request care online for non-urgent symptoms. For details visit [email or web address].   Also download the MyChart app! Go to the app store, search "MyChart", open the app, select , and log in with your MyChart username and password.  Due to Covid, a mask is required upon entering the hospital/clinic. If you do not have a mask, one will be given to you upon arrival. For doctor visits, patients may have 1 support person aged 92 or older with them. For treatment visits, patients cannot have anyone with them due to current Covid guidelines and our immunocompromised population.

## 2022-01-20 ENCOUNTER — Ambulatory Visit: Payer: Managed Care, Other (non HMO)

## 2022-01-20 ENCOUNTER — Inpatient Hospital Stay: Payer: 59

## 2022-01-20 VITALS — BP 118/51 | HR 83 | Temp 98.9°F | Resp 17

## 2022-01-20 DIAGNOSIS — Z5112 Encounter for antineoplastic immunotherapy: Secondary | ICD-10-CM | POA: Diagnosis not present

## 2022-01-20 DIAGNOSIS — Z95828 Presence of other vascular implants and grafts: Secondary | ICD-10-CM

## 2022-01-20 LAB — T4: T4, Total: 2.8 ug/dL — ABNORMAL LOW (ref 4.5–12.0)

## 2022-01-20 MED ORDER — FILGRASTIM-SNDZ 480 MCG/0.8ML IJ SOSY
480.0000 ug | PREFILLED_SYRINGE | Freq: Once | INTRAMUSCULAR | Status: AC
  Administered 2022-01-20: 15:00:00 480 ug via SUBCUTANEOUS
  Filled 2022-01-20: qty 0.8

## 2022-01-21 ENCOUNTER — Ambulatory Visit: Payer: Managed Care, Other (non HMO)

## 2022-01-21 ENCOUNTER — Other Ambulatory Visit: Payer: Self-pay

## 2022-01-21 ENCOUNTER — Inpatient Hospital Stay: Payer: 59

## 2022-01-21 VITALS — BP 101/56 | HR 86 | Temp 98.8°F | Resp 16

## 2022-01-21 DIAGNOSIS — Z95828 Presence of other vascular implants and grafts: Secondary | ICD-10-CM

## 2022-01-21 DIAGNOSIS — Z5112 Encounter for antineoplastic immunotherapy: Secondary | ICD-10-CM | POA: Diagnosis not present

## 2022-01-21 MED ORDER — FILGRASTIM-SNDZ 480 MCG/0.8ML IJ SOSY
480.0000 ug | PREFILLED_SYRINGE | Freq: Once | INTRAMUSCULAR | Status: AC
  Administered 2022-01-21: 480 ug via SUBCUTANEOUS
  Filled 2022-01-21: qty 0.8

## 2022-01-25 MED FILL — Dexamethasone Sodium Phosphate Inj 100 MG/10ML: INTRAMUSCULAR | Qty: 1 | Status: AC

## 2022-01-25 NOTE — Progress Notes (Signed)
Patient Care Team: [name], MD as PCP - General (Family Medicine)  DIAGNOSIS:    ICD-10-CM   1. Malignant neoplasm of upper-outer quadrant of right breast in female, estrogen receptor negative ([place])  C50.411    Z17.1       SUMMARY OF ONCOLOGIC HISTORY: Oncology History  Malignant neoplasm of upper-outer quadrant of right breast in female, estrogen receptor negative ([place])  11/04/2021 Initial Diagnosis    Patient has been breast-feeding.  Felt a painful right breast mass and an axillary mass.  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 with a Ki-67 of 40%, 1 abnormal lymph node 5.8 cm biopsy: Grade 3 IDC ER 5%, PR 0%, HER2 negative, Ki-67 40%   11/11/2021 Cancer Staging   Staging form: Breast, AJCC 8th Edition - Clinical stage from 11/11/2021: Stage IIB (cT2, cN1, cM0, G3, ER+, PR+, HER2-) - Signed by Nicholas Lose, MD on 11/11/2021 Stage prefix: Initial diagnosis Histologic grading system: 3 grade system    11/30/2021 -  Chemotherapy   Patient is on Treatment Plan : BREAST Pembrolizumab + Carboplatin D1 + Paclitaxel D1,8,15 q21d X 4 cycles / Pembrolizumab + AC q21d x 4 cycles     11/30/2021 Genetic Testing   Negative genetic testing on the CancerNext-Expanded+RNA insight panel.  The report date is 11/30/2021  The CancerNext-Expanded gene panel offered by [facility] and includes sequencing and rearrangement analysis for the following 77 genes: AIP, ALK, APC*, ATM*, AXIN2, BAP1, BARD1, BLM, BMPR1A, BRCA1*, BRCA2*, BRIP1*, CDC73, CDH1*, CDK4, CDKN1B, CDKN2A, CHEK2*, CTNNA1, DICER1, FANCC, FH, FLCN, GALNT12, KIF1B, LZTR1, MAX, MEN1, MET, MLH1*, MSH2*, MSH3, MSH6*, MUTYH*, NBN, NF1*, NF2, NTHL1, PALB2*, PHOX2B, PMS2*, POT1, PRKAR1A, PTCH1, PTEN*, RAD51C*, RAD51D*, RB1, RECQL, RET, SDHA, SDHAF2, SDHB, SDHC, SDHD, SMAD4, SMARCA4, SMARCB1, SMARCE1, STK11, SUFU, TMEM127, TP53*, TSC1, TSC2, VHL and XRCC2 (sequencing and deletion/duplication);  EGFR, EGLN1, HOXB13, KIT, MITF, PDGFRA, POLD1, and POLE (sequencing only); EPCAM and GREM1 (deletion/duplication only). DNA and RNA analyses performed for * genes.      CHIEF COMPLIANT: Cycle 9 Taxol  INTERVAL HISTORY: Sydney Rios is a 41 y.o. with above-mentioned history of right breast cancer, currently on neoadjuvant chemotherapy with Taxol. She presents to the clinic today for toxicity check.  She is tolerating Taxol chemotherapy fairly well.  Today is cycle 9.  She reports no major side effects from treatment.  Denies peripheral neuropathy.  ALLERGIES:  has No Known Allergies.  MEDICATIONS:  Current Outpatient Medications  Medication Sig Dispense Refill   Ascorbic Acid (VITAMIN C) 1000 MG tablet Take 1,000 mg by mouth 2 (two) times daily as needed (immune support (if sick)).     Cholecalciferol (VITAMIN D3) 125 MCG (5000 UT) TABS Take 5,000 Units by mouth daily as needed (immune support (if sick)).     clindamycin (CLINDAGEL) 1 % gel Apply topically 2 (two) times daily. 30 g 0   IVERMECTIN PO Take 20 mg by mouth in the morning.     lidocaine-prilocaine (EMLA) cream Apply to affected area once 30 g 3   Menaquinone-7 (VITAMIN K2) 100 MCG CAPS Take 100 mcg by mouth daily as needed (immune support (if sick)).     NALTREXONE HCL PO Take 1 mg by mouth in the morning.     ondansetron (ZOFRAN) 8 MG tablet Take 1 tablet (8 mg total) by mouth 2 (two) times daily as needed. Start on the third day after carboplatin and AC chemotherapy. 30 tablet 1   [  START ON 03/10/2022] pegfilgrastim-bmez (ZIEXTENZO) 6 MG/0.6ML injection Inject 0.6 mLs (6 mg total) into the skin once for 1 dose. 0.6 mL 2   prochlorperazine (COMPAZINE) 10 MG tablet Take 1 tablet (10 mg total) by mouth every 6 (six) hours as needed (Nausea or vomiting). 30 tablet 1   thyroid (ARMOUR) 30 MG tablet Take 30 mg by mouth daily before breakfast.     valACYclovir (VALTREX) 1000 MG tablet Take 2 g by mouth 2 (two) times daily as needed  (cold sore/fever blisters).     Zinc 50 MG TABS Take 50 mg by mouth daily as needed.     No current facility-administered medications for this visit.   Facility-Administered Medications Ordered in Other Visits  Medication Dose Route Frequency Provider Last Rate Last Admin   dexamethasone (DECADRON) 10 mg in sodium chloride 0.9 % 50 mL IVPB  10 mg Intravenous Once Nicholas Lose, MD 204 mL/hr at 01/26/22 1118 10 mg at 01/26/22 1118   famotidine (PEPCID) IVPB 20 mg premix  20 mg Intravenous Once Nicholas Lose, MD       heparin lock flush 100 unit/mL  500 Units Intracatheter Once PRN Nicholas Lose, MD       PACLitaxel (TAXOL) 108 mg in sodium chloride 0.9 % 250 mL chemo infusion (</= $RemoveBefor'80mg'aLIsZwnAuRJa$ /m2)  60 mg/m2 (Treatment Plan Recorded) Intravenous Once Nicholas Lose, MD       sodium chloride flush (NS) 0.9 % injection 10 mL  10 mL Intracatheter PRN Nicholas Lose, MD        PHYSICAL EXAMINATION: ECOG PERFORMANCE STATUS: 1 - Symptomatic but completely ambulatory  Vitals:   01/26/22 1012  BP: 127/70  Pulse: 92  Resp: 18  Temp: (!) 97.5 F (36.4 C)  SpO2: 100%   Filed Weights   01/26/22 1012  Weight: 155 lb 14.4 oz (70.7 kg)      LABORATORY DATA:  I have reviewed the data as listed CMP Latest Ref Rng & Units 01/26/2022 01/19/2022 01/12/2022  Glucose 70 - 99 mg/dL 110(H) 98 129(H)  BUN 6 - 20 mg/dL $Remove'14 20 8  'PKfKoop$ Creatinine 0.44 - 1.00 mg/dL 0.75 0.64 0.64  Sodium 135 - 145 mmol/L 140 139 142  Potassium 3.5 - 5.1 mmol/L 3.8 4.1 3.8  Chloride 98 - 111 mmol/L 106 104 108  CO2 22 - 32 mmol/L $RemoveB'28 30 27  'RUigVwhR$ Calcium 8.9 - 10.3 mg/dL 9.4 9.9 9.3  Total Protein 6.5 - 8.1 g/dL 6.2(L) 6.2(L) 6.2(L)  Total Bilirubin 0.3 - 1.2 mg/dL 0.2(L) 0.2(L) 0.2(L)  Alkaline Phos 38 - 126 U/L 53 61 54  AST 15 - 41 U/L 29 15 13(L)  ALT 0 - 44 U/L 32 15 15    Lab Results  Component Value Date   WBC 3.7 (L) 01/26/2022   HGB 10.3 (L) 01/26/2022   HCT 30.6 (L) 01/26/2022   MCV 92.7 01/26/2022   PLT 232 01/26/2022    NEUTROABS 2.0 01/26/2022    ASSESSMENT & PLAN:  Malignant neoplasm of upper-outer quadrant of right breast in female, estrogen receptor negative ([place]) 11/04/2021: Patient has been breast-feeding.  Felt a painful right breast mass and an axillary mass.  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 with a Ki-67 of 40%, 1 abnormal lymph node 5.8 cm biopsy: Grade 3 IDC ER 5%, PR 0%, HER2 negative, Ki-67 40%   CT CAP 11/20/2021: Right breast cancer with axillary lymph nodes, mildly enlarged right retropectoral lymph nodes, right internal  mammary lymph nodes Breast MRI 11/17/2021: Known breast cancer 2 cm, right axillary lymph node, non-mass enhancement 7 cm, borderline enlarged right internal mammary lymph nodes   Treatment Plan: based on multidisciplinary tumor board: 1. Neoadjuvant chemotherapy with Taxol weekly 12 with Keytruda and carboplatin every 3 weeks x4 Adriamycin and Cytoxan, Keytruda dose dense 4 followed by  followed by Beryle Flock maintenance for 1 year 2. Followed by mastectomy with targeted axillary dissection 3. Followed by adjuvant radiation therapy  ---------------------------------------------------------------------------------------------------------------------------   Current Treatment: Cycle 9 weekly Taxol (carboplatin and Keytruda every 3 weeks) Labs reviewed   Chemo Toxicities: Denies peripheral neuropathy Mild to moderate fatigue Denies any nausea or vomiting Cytopenias: ANC today 2, hemoglobin 10.3: Currently is receiving G-CSF injections after each cycle of chemo.   She will finish 10 cycles of weekly Taxol next week.  After that she will start every 3-week Adriamycin and Cytoxan with Keytruda.   She will get Neulasta injection on day 3.  I will see her with the first treatment of Adriamycin and Cytoxan.  No orders of the defined types were placed in this encounter.  The patient has a good understanding of the overall  plan. she agrees with it. she will call with any problems that may develop before the next visit here.  Total time spent: 30 mins including face to face time and time spent for planning, charting and coordination of care  Rulon Eisenmenger, MD, MPH 01/26/2022  I, Thana Ates, am acting as scribe for Dr. Nicholas Lose.  I have reviewed the above documentation for accuracy and completeness, and I agree with the above.

## 2022-01-26 ENCOUNTER — Inpatient Hospital Stay: Payer: 59

## 2022-01-26 ENCOUNTER — Inpatient Hospital Stay: Payer: 59 | Admitting: Hematology and Oncology

## 2022-01-26 ENCOUNTER — Other Ambulatory Visit: Payer: Self-pay

## 2022-01-26 DIAGNOSIS — C50411 Malignant neoplasm of upper-outer quadrant of right female breast: Secondary | ICD-10-CM

## 2022-01-26 DIAGNOSIS — Z171 Estrogen receptor negative status [ER-]: Secondary | ICD-10-CM

## 2022-01-26 DIAGNOSIS — Z5112 Encounter for antineoplastic immunotherapy: Secondary | ICD-10-CM | POA: Diagnosis not present

## 2022-01-26 DIAGNOSIS — Z95828 Presence of other vascular implants and grafts: Secondary | ICD-10-CM

## 2022-01-26 LAB — CMP (CANCER CENTER ONLY)
ALT: 32 U/L (ref 0–44)
AST: 29 U/L (ref 15–41)
Albumin: 4 g/dL (ref 3.5–5.0)
Alkaline Phosphatase: 53 U/L (ref 38–126)
Anion gap: 6 (ref 5–15)
BUN: 14 mg/dL (ref 6–20)
CO2: 28 mmol/L (ref 22–32)
Calcium: 9.4 mg/dL (ref 8.9–10.3)
Chloride: 106 mmol/L (ref 98–111)
Creatinine: 0.75 mg/dL (ref 0.44–1.00)
GFR, Estimated: >60 mL/min (ref >60)
Glucose, Bld: 110 mg/dL — ABNORMAL HIGH (ref 70–99)
Potassium: 3.8 mmol/L (ref 3.5–5.1)
Sodium: 140 mmol/L (ref 135–145)
Total Bilirubin: 0.2 mg/dL — ABNORMAL LOW (ref 0.3–1.2)
Total Protein: 6.2 g/dL — ABNORMAL LOW (ref 6.5–8.1)

## 2022-01-26 LAB — CBC WITH DIFFERENTIAL (CANCER CENTER ONLY)
Abs Immature Granulocytes: 0.15 10*3/uL — ABNORMAL HIGH (ref 0.00–0.07)
Basophils Absolute: 0 10*3/uL (ref 0.0–0.1)
Basophils Relative: 1 %
Eosinophils Absolute: 0 10*3/uL (ref 0.0–0.5)
Eosinophils Relative: 1 %
HCT: 30.6 % — ABNORMAL LOW (ref 36.0–46.0)
Hemoglobin: 10.3 g/dL — ABNORMAL LOW (ref 12.0–15.0)
Immature Granulocytes: 4 %
Lymphocytes Relative: 33 %
Lymphs Abs: 1.2 10*3/uL (ref 0.7–4.0)
MCH: 31.2 pg (ref 26.0–34.0)
MCHC: 33.7 g/dL (ref 30.0–36.0)
MCV: 92.7 fL (ref 80.0–100.0)
Monocytes Absolute: 0.3 10*3/uL (ref 0.1–1.0)
Monocytes Relative: 7 %
Neutro Abs: 2 10*3/uL (ref 1.7–7.7)
Neutrophils Relative %: 54 %
Platelet Count: 232 10*3/uL (ref 150–400)
RBC: 3.3 MIL/uL — ABNORMAL LOW (ref 3.87–5.11)
RDW: 17.4 % — ABNORMAL HIGH (ref 11.5–15.5)
WBC Count: 3.7 10*3/uL — ABNORMAL LOW (ref 4.0–10.5)
nRBC: 0 % (ref 0.0–0.2)

## 2022-01-26 LAB — TSH: TSH: 44.027 u[IU]/mL — ABNORMAL HIGH (ref 0.308–3.960)

## 2022-01-26 MED ORDER — SODIUM CHLORIDE 0.9% FLUSH
10.0000 mL | INTRAVENOUS | Status: DC | PRN
  Administered 2022-01-26: 10 mL

## 2022-01-26 MED ORDER — SODIUM CHLORIDE 0.9 % IV SOLN: Freq: Once | INTRAVENOUS | Status: AC

## 2022-01-26 MED ORDER — SODIUM CHLORIDE 0.9 % IV SOLN
10.0000 mg | Freq: Once | INTRAVENOUS | Status: AC
  Administered 2022-01-26: 10 mg via INTRAVENOUS
  Filled 2022-01-26: qty 10

## 2022-01-26 MED ORDER — SODIUM CHLORIDE 0.9% FLUSH
10.0000 mL | Freq: Once | INTRAVENOUS | Status: AC
  Administered 2022-01-26: 10 mL

## 2022-01-26 MED ORDER — DIPHENHYDRAMINE HCL 50 MG/ML IJ SOLN
12.5000 mg | Freq: Once | INTRAMUSCULAR | Status: AC
  Administered 2022-01-26: 12.5 mg via INTRAVENOUS
  Filled 2022-01-26: qty 1

## 2022-01-26 MED ORDER — SODIUM CHLORIDE 0.9 % IV SOLN
60.0000 mg/m2 | Freq: Once | INTRAVENOUS | Status: AC
  Administered 2022-01-26: 108 mg via INTRAVENOUS
  Filled 2022-01-26: qty 18

## 2022-01-26 MED ORDER — FAMOTIDINE IN NACL 20-0.9 MG/50ML-% IV SOLN
20.0000 mg | Freq: Once | INTRAVENOUS | Status: AC
  Administered 2022-01-26: 20 mg via INTRAVENOUS
  Filled 2022-01-26: qty 50

## 2022-01-26 MED ORDER — HEPARIN SOD (PORK) LOCK FLUSH 100 UNIT/ML IV SOLN
500.0000 [IU] | Freq: Once | INTRAVENOUS | Status: AC | PRN
  Administered 2022-01-26: 500 [IU]

## 2022-01-26 NOTE — Patient Instructions (Signed)
[facility]  Discharge Instructions: Thank you for choosing [facility] to provide your oncology and hematology care.   If you have a lab appointment with the [facility], please go directly to the [facility] and check in at the registration area.   Wear comfortable clothing and clothing appropriate for easy access to any Portacath or PICC line.   We strive to give you quality time with your provider. You may need to reschedule your appointment if you arrive late (15 or more minutes).  Arriving late affects you and other patients whose appointments are after yours.  Also, if you miss three or more appointments without notifying the office, you may be dismissed from the clinic at the providers discretion.      For prescription refill requests, have your pharmacy contact our office and allow 72 hours for refills to be completed.    Today you received the following chemotherapy and/or immunotherapy agents: Taxol      To help prevent nausea and vomiting after your treatment, we encourage you to take your nausea medication as directed.  BELOW ARE SYMPTOMS THAT SHOULD BE REPORTED IMMEDIATELY: *FEVER GREATER THAN 100.4 F (38 C) OR HIGHER *CHILLS OR SWEATING *NAUSEA AND VOMITING THAT IS NOT CONTROLLED WITH YOUR NAUSEA MEDICATION *UNUSUAL SHORTNESS OF BREATH *UNUSUAL BRUISING OR BLEEDING *URINARY PROBLEMS (pain or burning when urinating, or frequent urination) *BOWEL PROBLEMS (unusual diarrhea, constipation, pain near the anus) TENDERNESS IN MOUTH AND THROAT WITH OR WITHOUT PRESENCE OF ULCERS (sore throat, sores in mouth, or a toothache) UNUSUAL RASH, SWELLING OR PAIN  UNUSUAL VAGINAL DISCHARGE OR ITCHING   Items with * indicate a potential emergency and should be followed up as soon as possible or go to the Emergency Department if any problems should occur.  Please show the CHEMOTHERAPY ALERT CARD or IMMUNOTHERAPY ALERT CARD at check-in to the  Emergency Department and triage nurse.  Should you have questions after your visit or need to cancel or reschedule your appointment, please contact [facility]  Dept: [phone]  and follow the prompts.  Office hours are 8:00 a.m. to 4:30 p.m. Monday - Friday. Please note that voicemails left after 4:00 p.m. may not be returned until the following business day.  We are closed weekends and major holidays. You have access to a nurse at all times for urgent questions. Please call the main number to the clinic Dept: [phone] and follow the prompts.   For any non-urgent questions, you may also contact your provider using MyChart. We now offer e-Visits for anyone 19 and older to request care online for non-urgent symptoms. For details visit [email or web address].   Also download the MyChart app! Go to the app store, search "MyChart", open the app, select [facility], and log in with your MyChart username and password.  Due to Covid, a mask is required upon entering the hospital/clinic. If you do not have a mask, one will be given to you upon arrival. For doctor visits, patients may have 1 support person aged 62 or older with them. For treatment visits, patients cannot have anyone with them due to current Covid guidelines and our immunocompromised population.

## 2022-01-26 NOTE — Assessment & Plan Note (Signed)
11/04/2021:Patient has been breast-feeding. Felt a painful right breast mass and an axillary mass. 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 Neoadjuvant chemotherapy with Taxol weekly 12 withKeytruda andcarboplatin every 3 weeks x4 Adriamycin and Cytoxan, Keytrudadose dense 4 followed by followed by Beryle Flock maintenance for 1 year 2. Followed bymastectomywith targeted axillary dissection 3. Followed by adjuvant radiation therapy ---------------------------------------------------------------------------------------------------------------------------  Current Treatment: Cycle 9 weekly Taxol (carboplatin and Keytruda every 3 weeks) Labs reviewed  Chemo Toxicities: 1. Denies peripheral neuropathy 2. Mild to moderate fatigue 3. Denies any nausea or vomiting 4. Cytopenias: ANC today 1.8, hemoglobin 9.8: Currently is receiving G-CSF injections after each cycle of chemo.  Once she finishes weekly Taxol treatments then 2 weeks later we will start her systemic therapy with Adriamycin and Cytoxan and Keytruda.   RTCto see me weekly on chemo and every other week for follow up with me

## 2022-01-27 ENCOUNTER — Ambulatory Visit: Payer: Managed Care, Other (non HMO)

## 2022-01-27 ENCOUNTER — Inpatient Hospital Stay: Payer: 59 | Attending: Hematology and Oncology

## 2022-01-27 ENCOUNTER — Encounter: Payer: Self-pay | Admitting: Hematology and Oncology

## 2022-01-27 VITALS — BP 117/57 | HR 64 | Temp 98.5°F | Resp 18

## 2022-01-27 DIAGNOSIS — Z5112 Encounter for antineoplastic immunotherapy: Secondary | ICD-10-CM | POA: Insufficient documentation

## 2022-01-27 DIAGNOSIS — C50411 Malignant neoplasm of upper-outer quadrant of right female breast: Secondary | ICD-10-CM | POA: Diagnosis present

## 2022-01-27 DIAGNOSIS — Z171 Estrogen receptor negative status [ER-]: Secondary | ICD-10-CM | POA: Diagnosis not present

## 2022-01-27 DIAGNOSIS — Z5111 Encounter for antineoplastic chemotherapy: Secondary | ICD-10-CM | POA: Insufficient documentation

## 2022-01-27 DIAGNOSIS — Z5189 Encounter for other specified aftercare: Secondary | ICD-10-CM | POA: Diagnosis not present

## 2022-01-27 DIAGNOSIS — Z95828 Presence of other vascular implants and grafts: Secondary | ICD-10-CM

## 2022-01-27 DIAGNOSIS — R609 Edema, unspecified: Secondary | ICD-10-CM | POA: Diagnosis not present

## 2022-01-27 DIAGNOSIS — Z79899 Other long term (current) drug therapy: Secondary | ICD-10-CM | POA: Diagnosis not present

## 2022-01-27 DIAGNOSIS — R946 Abnormal results of thyroid function studies: Secondary | ICD-10-CM | POA: Diagnosis not present

## 2022-01-27 DIAGNOSIS — C773 Secondary and unspecified malignant neoplasm of axilla and upper limb lymph nodes: Secondary | ICD-10-CM | POA: Diagnosis not present

## 2022-01-27 LAB — T4: T4, Total: 1.4 ug/dL — ABNORMAL LOW (ref 4.5–12.0)

## 2022-01-27 MED ORDER — FILGRASTIM-SNDZ 480 MCG/0.8ML IJ SOSY
480.0000 ug | PREFILLED_SYRINGE | Freq: Once | INTRAMUSCULAR | Status: AC
  Administered 2022-01-27: 480 ug via SUBCUTANEOUS
  Filled 2022-01-27: qty 0.8

## 2022-01-28 ENCOUNTER — Inpatient Hospital Stay: Payer: 59

## 2022-01-28 ENCOUNTER — Other Ambulatory Visit: Payer: Self-pay

## 2022-01-28 ENCOUNTER — Encounter: Payer: Self-pay | Admitting: Plastic Surgery

## 2022-01-28 VITALS — BP 108/60 | HR 78 | Temp 98.0°F | Resp 16

## 2022-01-28 DIAGNOSIS — Z5112 Encounter for antineoplastic immunotherapy: Secondary | ICD-10-CM | POA: Diagnosis not present

## 2022-01-28 DIAGNOSIS — Z95828 Presence of other vascular implants and grafts: Secondary | ICD-10-CM

## 2022-01-28 MED ORDER — FILGRASTIM-SNDZ 480 MCG/0.8ML IJ SOSY
480.0000 ug | PREFILLED_SYRINGE | Freq: Once | INTRAMUSCULAR | Status: AC
  Administered 2022-01-28: 480 ug via SUBCUTANEOUS
  Filled 2022-01-28: qty 0.8

## 2022-01-28 NOTE — Patient Instructions (Signed)
Filgrastim, G-CSF injection °What is this medication? °FILGRASTIM, G-CSF (fil GRA stim) is a granulocyte colony-stimulating factor that stimulates the growth of neutrophils, a type of white blood cell (WBC) important in the body's fight against infection. It is used to reduce the incidence of fever and infection in patients with certain types of cancer who are receiving chemotherapy that affects the bone marrow, to stimulate blood cell production for removal of WBCs from the body prior to a bone marrow transplantation, to reduce the incidence of fever and infection in patients who have severe chronic neutropenia, and to improve survival outcomes following high-dose radiation exposure that is toxic to the bone marrow. °This medicine may be used for other purposes; ask your health care provider or pharmacist if you have questions. °COMMON BRAND NAME(S): Neupogen, Nivestym, Releuko, Zarxio °What should I tell my care team before I take this medication? °They need to know if you have any of these conditions: °kidney disease °latex allergy °ongoing radiation therapy °sickle cell disease °an unusual or allergic reaction to filgrastim, pegfilgrastim, other medicines, foods, dyes, or preservatives °pregnant or trying to get pregnant °breast-feeding °How should I use this medication? °This medicine is for injection under the skin or infusion into a vein. As an infusion into a vein, it is usually given by a health care professional in a hospital or clinic setting. If you get this medicine at home, you will be taught how to prepare and give this medicine. Refer to the Instructions for Use that come with your medication packaging. Use exactly as directed. Take your medicine at regular intervals. Do not take your medicine more often than directed. °It is important that you put your used needles and syringes in a special sharps container. Do not put them in a trash can. If you do not have a sharps container, call your pharmacist  or healthcare provider to get one. °Talk to your pediatrician regarding the use of this medicine in children. While this drug may be prescribed for children as young as 7 months for selected conditions, precautions do apply. °Overdosage: If you think you have taken too much of this medicine contact a poison control center or emergency room at once. °NOTE: This medicine is only for you. Do not share this medicine with others. °What if I miss a dose? °It is important not to miss your dose. Call your doctor or health care professional if you miss a dose. °What may interact with this medication? °This medicine may interact with the following medications: °medicines that may cause a release of neutrophils, such as lithium °This list may not describe all possible interactions. Give your health care provider a list of all the medicines, herbs, non-prescription drugs, or dietary supplements you use. Also tell them if you smoke, drink alcohol, or use illegal drugs. Some items may interact with your medicine. °What should I watch for while using this medication? °Your condition will be monitored carefully while you are receiving this medicine. °You may need blood work done while you are taking this medicine. °Talk to your health care provider about your risk of cancer. You may be more at risk for certain types of cancer if you take this medicine. °What side effects may I notice from receiving this medication? °Side effects that you should report to your doctor or health care professional as soon as possible: °allergic reactions like skin rash, itching or hives, swelling of the face, lips, or tongue °back pain °dizziness or feeling faint °fever °pain, redness, or   irritation at site where injected °pinpoint red spots on the skin °shortness of breath or breathing problems °signs and symptoms of kidney injury like trouble passing urine, change in the amount of urine, or red or dark-brown urine °stomach or side pain, or pain at  the shoulder °swelling °tiredness °unusual bleeding or bruising °Side effects that usually do not require medical attention (report to your doctor or health care professional if they continue or are bothersome): °bone pain °cough °diarrhea °hair loss °headache °muscle pain °This list may not describe all possible side effects. Call your doctor for medical advice about side effects. You may report side effects to FDA at 1-800-FDA-1088. °Where should I keep my medication? °Keep out of the reach of children. °Store in a refrigerator between 2 and 8 degrees C (36 and 46 degrees F). Do not freeze. Keep in carton to protect from light. Throw away this medicine if vials or syringes are left out of the refrigerator for more than 24 hours. Throw away any unused medicine after the expiration date. °NOTE: This sheet is a summary. It may not cover all possible information. If you have questions about this medicine, talk to your doctor, pharmacist, or health care provider. °© 2022 [employer] (2021-09-01 00:00:00) ° °

## 2022-01-29 ENCOUNTER — Encounter: Payer: Self-pay | Admitting: Hematology and Oncology

## 2022-02-01 ENCOUNTER — Encounter: Payer: Self-pay | Admitting: Plastic Surgery

## 2022-02-01 MED FILL — Fosaprepitant Dimeglumine For IV Infusion 150 MG (Base Eq): INTRAVENOUS | Qty: 5 | Status: AC

## 2022-02-01 MED FILL — Dexamethasone Sodium Phosphate Inj 100 MG/10ML: INTRAMUSCULAR | Qty: 1 | Status: AC

## 2022-02-02 ENCOUNTER — Inpatient Hospital Stay: Payer: 59

## 2022-02-02 ENCOUNTER — Other Ambulatory Visit: Payer: Self-pay

## 2022-02-02 ENCOUNTER — Encounter: Payer: Self-pay | Admitting: Hematology and Oncology

## 2022-02-02 VITALS — BP 117/77 | HR 64 | Temp 98.3°F | Resp 15 | Wt 159.8 lb

## 2022-02-02 DIAGNOSIS — Z5112 Encounter for antineoplastic immunotherapy: Secondary | ICD-10-CM | POA: Diagnosis not present

## 2022-02-02 DIAGNOSIS — Z95828 Presence of other vascular implants and grafts: Secondary | ICD-10-CM

## 2022-02-02 DIAGNOSIS — C50411 Malignant neoplasm of upper-outer quadrant of right female breast: Secondary | ICD-10-CM

## 2022-02-02 DIAGNOSIS — Z171 Estrogen receptor negative status [ER-]: Secondary | ICD-10-CM

## 2022-02-02 LAB — CBC WITH DIFFERENTIAL (CANCER CENTER ONLY)
Abs Immature Granulocytes: 0.15 10*3/uL — ABNORMAL HIGH (ref 0.00–0.07)
Basophils Absolute: 0.1 10*3/uL (ref 0.0–0.1)
Basophils Relative: 1 %
Eosinophils Absolute: 0 10*3/uL (ref 0.0–0.5)
Eosinophils Relative: 1 %
HCT: 31.5 % — ABNORMAL LOW (ref 36.0–46.0)
Hemoglobin: 10.3 g/dL — ABNORMAL LOW (ref 12.0–15.0)
Immature Granulocytes: 3 %
Lymphocytes Relative: 19 %
Lymphs Abs: 0.8 10*3/uL (ref 0.7–4.0)
MCH: 31.2 pg (ref 26.0–34.0)
MCHC: 32.7 g/dL (ref 30.0–36.0)
MCV: 95.5 fL (ref 80.0–100.0)
Monocytes Absolute: 0.4 10*3/uL (ref 0.1–1.0)
Monocytes Relative: 9 %
Neutro Abs: 2.9 10*3/uL (ref 1.7–7.7)
Neutrophils Relative %: 67 %
Platelet Count: 202 10*3/uL (ref 150–400)
RBC: 3.3 MIL/uL — ABNORMAL LOW (ref 3.87–5.11)
RDW: 18.7 % — ABNORMAL HIGH (ref 11.5–15.5)
WBC Count: 4.4 10*3/uL (ref 4.0–10.5)
nRBC: 0 % (ref 0.0–0.2)

## 2022-02-02 LAB — CMP (CANCER CENTER ONLY)
ALT: 19 U/L (ref 0–44)
AST: 13 U/L — ABNORMAL LOW (ref 15–41)
Albumin: 4 g/dL (ref 3.5–5.0)
Alkaline Phosphatase: 62 U/L (ref 38–126)
Anion gap: 6 (ref 5–15)
BUN: 14 mg/dL (ref 6–20)
CO2: 27 mmol/L (ref 22–32)
Calcium: 9.4 mg/dL (ref 8.9–10.3)
Chloride: 107 mmol/L (ref 98–111)
Creatinine: 0.72 mg/dL (ref 0.44–1.00)
GFR, Estimated: >60 mL/min (ref >60)
Glucose, Bld: 100 mg/dL — ABNORMAL HIGH (ref 70–99)
Potassium: 4.2 mmol/L (ref 3.5–5.1)
Sodium: 140 mmol/L (ref 135–145)
Total Bilirubin: 0.2 mg/dL — ABNORMAL LOW (ref 0.3–1.2)
Total Protein: 6.2 g/dL — ABNORMAL LOW (ref 6.5–8.1)

## 2022-02-02 LAB — TSH: TSH: 59.897 u[IU]/mL — ABNORMAL HIGH (ref 0.308–3.960)

## 2022-02-02 MED ORDER — DIPHENHYDRAMINE HCL 50 MG/ML IJ SOLN
12.5000 mg | Freq: Once | INTRAMUSCULAR | Status: AC
  Administered 2022-02-02: 12.5 mg via INTRAVENOUS
  Filled 2022-02-02: qty 1

## 2022-02-02 MED ORDER — FAMOTIDINE IN NACL 20-0.9 MG/50ML-% IV SOLN
20.0000 mg | Freq: Once | INTRAVENOUS | Status: AC
  Administered 2022-02-02: 20 mg via INTRAVENOUS
  Filled 2022-02-02: qty 50

## 2022-02-02 MED ORDER — SODIUM CHLORIDE 0.9 % IV SOLN
60.0000 mg/m2 | Freq: Once | INTRAVENOUS | Status: AC
  Administered 2022-02-02: 108 mg via INTRAVENOUS
  Filled 2022-02-02: qty 18

## 2022-02-02 MED ORDER — SODIUM CHLORIDE 0.9 % IV SOLN
200.0000 mg | Freq: Once | INTRAVENOUS | Status: AC
  Administered 2022-02-02: 200 mg via INTRAVENOUS
  Filled 2022-02-02: qty 200

## 2022-02-02 MED ORDER — SODIUM CHLORIDE 0.9 % IV SOLN
600.0000 mg | Freq: Once | INTRAVENOUS | Status: AC
  Administered 2022-02-02: 600 mg via INTRAVENOUS
  Filled 2022-02-02: qty 60

## 2022-02-02 MED ORDER — SODIUM CHLORIDE 0.9 % IV SOLN
10.0000 mg | Freq: Once | INTRAVENOUS | Status: AC
  Administered 2022-02-02: 10 mg via INTRAVENOUS
  Filled 2022-02-02: qty 10

## 2022-02-02 MED ORDER — SODIUM CHLORIDE 0.9% FLUSH
10.0000 mL | Freq: Once | INTRAVENOUS | Status: AC
  Administered 2022-02-02: 10 mL

## 2022-02-02 MED ORDER — SODIUM CHLORIDE 0.9 % IV SOLN
150.0000 mg | Freq: Once | INTRAVENOUS | Status: AC
  Administered 2022-02-02: 150 mg via INTRAVENOUS
  Filled 2022-02-02: qty 150

## 2022-02-02 MED ORDER — SODIUM CHLORIDE 0.9% FLUSH
10.0000 mL | INTRAVENOUS | Status: DC | PRN
  Administered 2022-02-02: 10 mL

## 2022-02-02 MED ORDER — SODIUM CHLORIDE 0.9 % IV SOLN: Freq: Once | INTRAVENOUS | Status: AC

## 2022-02-02 MED ORDER — HEPARIN SOD (PORK) LOCK FLUSH 100 UNIT/ML IV SOLN
500.0000 [IU] | Freq: Once | INTRAVENOUS | Status: AC | PRN
  Administered 2022-02-02: 500 [IU]

## 2022-02-02 MED ORDER — PALONOSETRON HCL INJECTION 0.25 MG/5ML
0.2500 mg | Freq: Once | INTRAVENOUS | Status: AC
  Administered 2022-02-02: 0.25 mg via INTRAVENOUS
  Filled 2022-02-02: qty 5

## 2022-02-02 NOTE — Patient Instructions (Signed)
[facility]  Discharge Instructions: Thank you for choosing [facility] to provide your oncology and hematology care.   If you have a lab appointment with the [facility], please go directly to the [facility] and check in at the registration area.   Wear comfortable clothing and clothing appropriate for easy access to any Portacath or PICC line.   We strive to give you quality time with your provider. You may need to reschedule your appointment if you arrive late (15 or more minutes).  Arriving late affects you and other patients whose appointments are after yours.  Also, if you miss three or more appointments without notifying the office, you may be dismissed from the clinic at the providers discretion.      For prescription refill requests, have your pharmacy contact our office and allow 72 hours for refills to be completed.    Today you received the following chemotherapy and/or immunotherapy agents: Keytruda/Taxol/Carboplatin.      To help prevent nausea and vomiting after your treatment, we encourage you to take your nausea medication as directed.  BELOW ARE SYMPTOMS THAT SHOULD BE REPORTED IMMEDIATELY: *FEVER GREATER THAN 100.4 F (38 C) OR HIGHER *CHILLS OR SWEATING *NAUSEA AND VOMITING THAT IS NOT CONTROLLED WITH YOUR NAUSEA MEDICATION *UNUSUAL SHORTNESS OF BREATH *UNUSUAL BRUISING OR BLEEDING *URINARY PROBLEMS (pain or burning when urinating, or frequent urination) *BOWEL PROBLEMS (unusual diarrhea, constipation, pain near the anus) TENDERNESS IN MOUTH AND THROAT WITH OR WITHOUT PRESENCE OF ULCERS (sore throat, sores in mouth, or a toothache) UNUSUAL RASH, SWELLING OR PAIN  UNUSUAL VAGINAL DISCHARGE OR ITCHING   Items with * indicate a potential emergency and should be followed up as soon as possible or go to the Emergency Department if any problems should occur.  Please show the CHEMOTHERAPY ALERT CARD or IMMUNOTHERAPY ALERT  CARD at check-in to the Emergency Department and triage nurse.  Should you have questions after your visit or need to cancel or reschedule your appointment, please contact [facility]  Dept: [phone]  and follow the prompts.  Office hours are 8:00 a.m. to 4:30 p.m. Monday - Friday. Please note that voicemails left after 4:00 p.m. may not be returned until the following business day.  We are closed weekends and major holidays. You have access to a nurse at all times for urgent questions. Please call the main number to the clinic Dept: [phone] and follow the prompts.   For any non-urgent questions, you may also contact your provider using MyChart. We now offer e-Visits for anyone 42 and older to request care online for non-urgent symptoms. For details visit [email or web address].   Also download the MyChart app! Go to the app store, search "MyChart", open the app, select [facility], and log in with your MyChart username and password.  Due to Covid, a mask is required upon entering the hospital/clinic. If you do not have a mask, one will be given to you upon arrival. For doctor visits, patients may have 1 support person aged 81 or older with them. For treatment visits, patients cannot have anyone with them due to current Covid guidelines and our immunocompromised population.

## 2022-02-03 ENCOUNTER — Encounter: Payer: Self-pay | Admitting: *Deleted

## 2022-02-03 ENCOUNTER — Inpatient Hospital Stay: Payer: 59

## 2022-02-03 ENCOUNTER — Ambulatory Visit: Payer: Managed Care, Other (non HMO)

## 2022-02-03 ENCOUNTER — Institutional Professional Consult (permissible substitution): Payer: 59 | Admitting: Plastic Surgery

## 2022-02-03 LAB — T4: T4, Total: 0.9 ug/dL — ABNORMAL LOW (ref 4.5–12.0)

## 2022-02-04 ENCOUNTER — Encounter: Payer: Self-pay | Admitting: Adult Health

## 2022-02-04 ENCOUNTER — Other Ambulatory Visit: Payer: Self-pay

## 2022-02-04 ENCOUNTER — Ambulatory Visit: Payer: Managed Care, Other (non HMO)

## 2022-02-04 ENCOUNTER — Inpatient Hospital Stay (HOSPITAL_BASED_OUTPATIENT_CLINIC_OR_DEPARTMENT_OTHER): Payer: 59 | Admitting: Adult Health

## 2022-02-04 ENCOUNTER — Inpatient Hospital Stay: Payer: 59

## 2022-02-04 VITALS — BP 106/59 | HR 66 | Temp 97.5°F | Resp 18 | Ht 67.0 in | Wt 161.7 lb

## 2022-02-04 DIAGNOSIS — C50411 Malignant neoplasm of upper-outer quadrant of right female breast: Secondary | ICD-10-CM | POA: Diagnosis not present

## 2022-02-04 DIAGNOSIS — Z95828 Presence of other vascular implants and grafts: Secondary | ICD-10-CM

## 2022-02-04 DIAGNOSIS — Z171 Estrogen receptor negative status [ER-]: Secondary | ICD-10-CM

## 2022-02-04 DIAGNOSIS — Z5112 Encounter for antineoplastic immunotherapy: Secondary | ICD-10-CM | POA: Diagnosis not present

## 2022-02-04 MED ORDER — FUROSEMIDE 20 MG PO TABS: ORAL_TABLET | ORAL | 0 refills | Status: DC

## 2022-02-04 MED ORDER — FILGRASTIM-SNDZ 480 MCG/0.8ML IJ SOSY
480.0000 ug | PREFILLED_SYRINGE | Freq: Once | INTRAMUSCULAR | Status: AC
  Administered 2022-02-04: 480 ug via SUBCUTANEOUS
  Filled 2022-02-04: qty 0.8

## 2022-02-04 NOTE — Patient Instructions (Signed)
Filgrastim, G-CSF injection °What is this medication? °FILGRASTIM, G-CSF (fil GRA stim) is a granulocyte colony-stimulating factor that stimulates the growth of neutrophils, a type of white blood cell (WBC) important in the body's fight against infection. It is used to reduce the incidence of fever and infection in patients with certain types of cancer who are receiving chemotherapy that affects the bone marrow, to stimulate blood cell production for removal of WBCs from the body prior to a bone marrow transplantation, to reduce the incidence of fever and infection in patients who have severe chronic neutropenia, and to improve survival outcomes following high-dose radiation exposure that is toxic to the bone marrow. °This medicine may be used for other purposes; ask your health care provider or pharmacist if you have questions. °COMMON BRAND NAME(S): Neupogen, Nivestym, Releuko, Zarxio °What should I tell my care team before I take this medication? °They need to know if you have any of these conditions: °kidney disease °latex allergy °ongoing radiation therapy °sickle cell disease °an unusual or allergic reaction to filgrastim, pegfilgrastim, other medicines, foods, dyes, or preservatives °pregnant or trying to get pregnant °breast-feeding °How should I use this medication? °This medicine is for injection under the skin or infusion into a vein. As an infusion into a vein, it is usually given by a health care professional in a hospital or clinic setting. If you get this medicine at home, you will be taught how to prepare and give this medicine. Refer to the Instructions for Use that come with your medication packaging. Use exactly as directed. Take your medicine at regular intervals. Do not take your medicine more often than directed. °It is important that you put your used needles and syringes in a special sharps container. Do not put them in a trash can. If you do not have a sharps container, call your pharmacist  or healthcare provider to get one. °Talk to your pediatrician regarding the use of this medicine in children. While this drug may be prescribed for children as young as 7 months for selected conditions, precautions do apply. °Overdosage: If you think you have taken too much of this medicine contact a poison control center or emergency room at once. °NOTE: This medicine is only for you. Do not share this medicine with others. °What if I miss a dose? °It is important not to miss your dose. Call your doctor or health care professional if you miss a dose. °What may interact with this medication? °This medicine may interact with the following medications: °medicines that may cause a release of neutrophils, such as lithium °This list may not describe all possible interactions. Give your health care provider a list of all the medicines, herbs, non-prescription drugs, or dietary supplements you use. Also tell them if you smoke, drink alcohol, or use illegal drugs. Some items may interact with your medicine. °What should I watch for while using this medication? °Your condition will be monitored carefully while you are receiving this medicine. °You may need blood work done while you are taking this medicine. °Talk to your health care provider about your risk of cancer. You may be more at risk for certain types of cancer if you take this medicine. °What side effects may I notice from receiving this medication? °Side effects that you should report to your doctor or health care professional as soon as possible: °allergic reactions like skin rash, itching or hives, swelling of the face, lips, or tongue °back pain °dizziness or feeling faint °fever °pain, redness, or   irritation at site where injected °pinpoint red spots on the skin °shortness of breath or breathing problems °signs and symptoms of kidney injury like trouble passing urine, change in the amount of urine, or red or dark-brown urine °stomach or side pain, or pain at  the shoulder °swelling °tiredness °unusual bleeding or bruising °Side effects that usually do not require medical attention (report to your doctor or health care professional if they continue or are bothersome): °bone pain °cough °diarrhea °hair loss °headache °muscle pain °This list may not describe all possible side effects. Call your doctor for medical advice about side effects. You may report side effects to FDA at 1-800-FDA-1088. °Where should I keep my medication? °Keep out of the reach of children. °Store in a refrigerator between 2 and 8 degrees C (36 and 46 degrees F). Do not freeze. Keep in carton to protect from light. Throw away this medicine if vials or syringes are left out of the refrigerator for more than 24 hours. Throw away any unused medicine after the expiration date. °NOTE: This sheet is a summary. It may not cover all possible information. If you have questions about this medicine, talk to your doctor, pharmacist, or health care provider. °© 2022 [employer] (2021-09-01 00:00:00) ° °

## 2022-02-04 NOTE — Progress Notes (Signed)
[facility] Cancer Follow up:    Sydney Rasmussen, MD [address] [place] [address]   DIAGNOSIS:  Cancer Staging  Malignant neoplasm of upper-outer quadrant of right breast in female, estrogen receptor negative ([place]) Staging form: Breast, AJCC 8th Edition - Clinical stage from 11/11/2021: Stage IIB (cT2, cN1, cM0, G3, ER+, PR+, HER2-) - Signed by Nicholas Lose, MD on 11/11/2021 Stage prefix: Initial diagnosis Histologic grading system: 3 grade system   SUMMARY OF ONCOLOGIC HISTORY: Oncology History  Malignant neoplasm of upper-outer quadrant of right breast in female, estrogen receptor negative ([place])  11/04/2021 Initial Diagnosis    Patient has been breast-feeding.  Felt a painful right breast mass and an axillary mass.  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 with a Ki-67 of 40%, 1 abnormal lymph node 5.8 cm biopsy: Grade 3 IDC ER 5%, PR 0%, HER2 negative, Ki-67 40%   11/11/2021 Cancer Staging   Staging form: Breast, AJCC 8th Edition - Clinical stage from 11/11/2021: Stage IIB (cT2, cN1, cM0, G3, ER+, PR+, HER2-) - Signed by Nicholas Lose, MD on 11/11/2021 Stage prefix: Initial diagnosis Histologic grading system: 3 grade system    11/30/2021 -  Chemotherapy   Patient is on Treatment Plan : BREAST Pembrolizumab + Carboplatin D1 + Paclitaxel D1,8,15 q21d X 4 cycles / Pembrolizumab + AC q21d x 4 cycles     11/30/2021 Genetic Testing   Negative genetic testing on the CancerNext-Expanded+RNA insight panel.  The report date is 11/30/2021  The CancerNext-Expanded gene panel offered by [facility] and includes sequencing and rearrangement analysis for the following 77 genes: AIP, ALK, APC*, ATM*, AXIN2, BAP1, BARD1, BLM, BMPR1A, BRCA1*, BRCA2*, BRIP1*, CDC73, CDH1*, CDK4, CDKN1B, CDKN2A, CHEK2*, CTNNA1, DICER1, FANCC, FH, FLCN, GALNT12, KIF1B, LZTR1, MAX, MEN1, MET, MLH1*, MSH2*, MSH3, MSH6*, MUTYH*, NBN, NF1*,  NF2, NTHL1, PALB2*, PHOX2B, PMS2*, POT1, PRKAR1A, PTCH1, PTEN*, RAD51C*, RAD51D*, RB1, RECQL, RET, SDHA, SDHAF2, SDHB, SDHC, SDHD, SMAD4, SMARCA4, SMARCB1, SMARCE1, STK11, SUFU, TMEM127, TP53*, TSC1, TSC2, VHL and XRCC2 (sequencing and deletion/duplication); EGFR, EGLN1, HOXB13, KIT, MITF, PDGFRA, POLD1, and POLE (sequencing only); EPCAM and GREM1 (deletion/duplication only). DNA and RNA analyses performed for * genes.      CURRENT THERAPY: Neoadjuvant chemotherapy.  INTERVAL HISTORY: Sydney Rios 41 y.o. female returns for evaluation of swelling.  She notes that she feels like her rings are tight and her socks are tight and she is having more indentations.  The other night she could hardly get her rings off.  She denies any chest pain or palpitations.  She is concerned this could either be coming from the steroids or that something is going on with her heart.  She does have elevated TSH with her Keytruda.  With her previous cycle she had challenges with tolerating the NP thyroid that she was on.  She decided to stop the medication the week she received her Keytruda.  Her TSH was 50.  She notes that her heart rate has been slower.  She denies constipation she is also fatigued.     Patient Active Problem List   Diagnosis Date Noted   Genetic testing 12/01/2021   Port-A-Cath in place 11/30/2021   Malignant neoplasm of upper-outer quadrant of right breast in female, estrogen receptor negative ([facility]) 11/11/2021   Abnormal maternal glucose tolerance, antepartum 09/15/2018   S/P cesarean section 10/11/2014   TACHYCARDIA 05/01/2010    has No Known Allergies.  MEDICAL  HISTORY: Past Medical History:  Diagnosis Date   Anxiety    Chronic kidney disease    hx kidney stones   Complication of anesthesia    runs heart rate in the 30-40s in PACU   History of kidney stones    Hx gestational diabetes    Hx of Hashimoto thyroiditis    PONV (postoperative nausea and vomiting)     SURGICAL  HISTORY: Past Surgical History:  Procedure Laterality Date   CESAREAN SECTION     CESAREAN SECTION  01/23/2013   Procedure: CESAREAN SECTION;  Surgeon: Luz Lex, MD;  Location: [facility];  Service: Obstetrics;  Laterality: N/A;   CESAREAN SECTION N/A 10/11/2014   Procedure: CESAREAN SECTION;  Surgeon: Cyril Mourning, MD;  Location: [facility];  Service: Obstetrics;  Laterality: N/A;   CESAREAN SECTION N/A 10/24/2018   Procedure: REPEAT CESAREAN SECTION;  Surgeon: Dian Queen, MD;  Location: [facility];  Service: Obstetrics;  Laterality: N/A;  [facility]y RNFA   LITHOTRIPSY     PORTACATH PLACEMENT Left 11/25/2021   Procedure: PORT PLACEMENT;  Surgeon: Rolm Bookbinder, MD;  Location: [facility];  Service: General;  Laterality: Left;   WISDOM TOOTH EXTRACTION      SOCIAL HISTORY: Social History   Socioeconomic History   Marital status: Married    Spouse name: Not on file   Number of children: Not on file   Years of education: Not on file   Highest education level: Not on file  Occupational History   Not on file  Tobacco Use   Smoking status: Never   Smokeless tobacco: Never  Vaping Use   Vaping Use: Never used  Substance and Sexual Activity   Alcohol use: No   Drug use: No   Sexual activity: Yes    Birth control/protection: Other-see comments    Comment: Husband has had a vasectomy  Other Topics Concern   Not on file  Social History Narrative   Not on file   Social Determinants of Health   Financial Resource Strain: Not on file  Food Insecurity: Not on file  Transportation Needs: Not on file  Physical Activity: Not on file  Stress: Not on file  Social Connections: Not on file  Intimate Partner Violence: Not on file    FAMILY HISTORY: Family History  Problem Relation Age of Onset   Hypertension Mother    Diabetes Mother    Hypertension Father    Hypertension Maternal Grandmother    Diabetes Maternal Grandmother    Heart disease Maternal Grandfather     Hypertension Maternal Grandfather    Heart disease Paternal Grandfather    Hypertension Paternal Grandfather    Retinoblastoma Cousin        dx 3 mo - left eye    Review of Systems  Constitutional:  Negative for appetite change, chills, fatigue, fever and unexpected weight change.  HENT:   Negative for hearing loss, lump/mass and trouble swallowing.   Eyes:  Negative for eye problems and icterus.  Respiratory:  Negative for chest tightness, cough and shortness of breath.   Cardiovascular:  Negative for chest pain, leg swelling and palpitations.  Gastrointestinal:  Negative for abdominal distention, abdominal pain, constipation, diarrhea, nausea and vomiting.  Endocrine: Negative for hot flashes.  Genitourinary:  Negative for difficulty urinating.   Musculoskeletal:  Negative for arthralgias.  Skin:  Negative for itching and rash.  Neurological:  Negative for dizziness, extremity weakness, headaches and numbness.  Hematological:  Negative for adenopathy. Does  not bruise/bleed easily.  Psychiatric/Behavioral:  Negative for depression. The patient is not nervous/anxious.      PHYSICAL EXAMINATION  ECOG PERFORMANCE STATUS: 1 - Symptomatic but completely ambulatory  Vitals:   02/04/22 1456  BP: (!) 106/59  Pulse: 66  Resp: 18  Temp: (!) 97.5 F (36.4 C)  SpO2: 100%    Physical Exam Constitutional:      General: She is not in acute distress.    Appearance: Normal appearance. She is not toxic-appearing.  HENT:     Head: Normocephalic and atraumatic.  Eyes:     General: No scleral icterus. Cardiovascular:     Rate and Rhythm: Normal rate and regular rhythm.     Pulses: Normal pulses.     Heart sounds: Normal heart sounds.  Pulmonary:     Effort: Pulmonary effort is normal.     Breath sounds: Normal breath sounds.  Abdominal:     General: Abdomen is flat. Bowel sounds are normal. There is no distension.     Palpations: Abdomen is soft.     Tenderness: There is no  abdominal tenderness.  Musculoskeletal:        General: No swelling.     Cervical back: Neck supple.  Lymphadenopathy:     Cervical: No cervical adenopathy.  Skin:    General: Skin is warm and dry.     Findings: No rash.  Neurological:     General: No focal deficit present.     Mental Status: She is alert.  Psychiatric:        Mood and Affect: Mood normal.        Behavior: Behavior normal.    LABORATORY DATA:  CBC    Component Value Date/Time   WBC 4.4 02/02/2022 1008   WBC 9.6 10/25/2018 0515   RBC 3.30 (L) 02/02/2022 1008   HGB 10.3 (L) 02/02/2022 1008   HCT 31.5 (L) 02/02/2022 1008   PLT 202 02/02/2022 1008   MCV 95.5 02/02/2022 1008   MCH 31.2 02/02/2022 1008   MCHC 32.7 02/02/2022 1008   RDW 18.7 (H) 02/02/2022 1008   LYMPHSABS 0.8 02/02/2022 1008   MONOABS 0.4 02/02/2022 1008   EOSABS 0.0 02/02/2022 1008   BASOSABS 0.1 02/02/2022 1008    CMP     Component Value Date/Time   NA 140 02/02/2022 1008   K 4.2 02/02/2022 1008   CL 107 02/02/2022 1008   CO2 27 02/02/2022 1008   GLUCOSE 100 (H) 02/02/2022 1008   BUN 14 02/02/2022 1008   CREATININE 0.72 02/02/2022 1008   CALCIUM 9.4 02/02/2022 1008   PROT 6.2 (L) 02/02/2022 1008   ALBUMIN 4.0 02/02/2022 1008   AST 13 (L) 02/02/2022 1008   ALT 19 02/02/2022 1008   ALKPHOS 62 02/02/2022 1008   BILITOT 0.2 (L) 02/02/2022 1008   GFRNONAA >60 02/02/2022 1008   GFRAA >60 10/22/2018 2142       ASSESSMENT and THERAPY PLAN:   Malignant neoplasm of upper-outer quadrant of right breast in female, estrogen receptor negative ([place]) 11/04/2021: Patient has been breast-feeding.  Felt a painful right breast mass and an axillary mass.  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 with a Ki-67 of 40%, 1 abnormal lymph node 5.8 cm biopsy: Grade 3 IDC ER 5%, PR 0%, HER2 negative, Ki-67 40%   CT CAP 11/20/2021: Right breast cancer with axillary lymph nodes, mildly enlarged right  retropectoral lymph nodes, right internal mammary lymph nodes  Breast MRI 11/17/2021: Known breast cancer 2 cm, right axillary lymph node, non-mass enhancement 7 cm, borderline enlarged right internal mammary lymph nodes   Treatment Plan: based on multidisciplinary tumor board: 1. Neoadjuvant chemotherapy with Taxol weekly 12 with Keytruda and carboplatin every 3 weeks x4 Adriamycin and Cytoxan, Keytruda dose dense 4 followed by  followed by Beryle Flock maintenance for 1 year 2. Followed by mastectomy with targeted axillary dissection 3. Followed by adjuvant radiation therapy  ---------------------------------------------------------------------------------------------------------------------------   Current Treatment: Cycle 10 weekly Taxol (carboplatin and Keytruda every 3 weeks) Labs reviewed   Sydney Rios is here today out of concern for her swelling.  I reviewed the NCCN guidelines for supportive therapy side effects with checkpoint inhibitors.  There is no clear guideline indication for the abnormal thyroid outside of what is already being done.  I recommended that she restart her thyroid medication as her heart rate was likely decreased due to thyroid issue.  She let me know that she plans on doing this.  Sydney Rios is not having any other cardiac related symptoms such as chest pain shortness of breath on exertion, or paroxysmal nocturnal dyspnea.  The swelling is not related to her heart.  It could be related to the chemo, I question whether or not it is related to the thyroid issue.  I did write for her to take Lasix 20 mg every other day.  She returns 02/23/2022 for labs follow-up and her next chemotherapy   All questions were answered. The patient knows to call the clinic with any problems, questions or concerns. We can certainly see the patient much sooner if necessary.  Total encounter time: 30 minutes in face-to-face visit time, chart review, lab review, care coordination, order entry, and  documentation of the encounter.  Wilber Bihari, NP 02/04/22 9:44 PM Medical Oncology and Hematology [facility] [place], [place] [address] Tel. [phone]    Fax. [phone]  *Total Encounter Time as defined by the Centers for Medicare and Medicaid Services includes, in addition to the face-to-face time of a patient visit (documented in the note above) non-face-to-face time: obtaining and reviewing outside history, ordering and reviewing medications, tests or procedures, care coordination (communications with other health care professionals or caregivers) and documentation in the medical record.

## 2022-02-04 NOTE — Assessment & Plan Note (Addendum)
11/04/2021:Patient has been breast-feeding. Felt a painful right breast mass and an axillary mass. 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 Neoadjuvant chemotherapy with Taxol weekly 12 withKeytruda andcarboplatin every 3 weeks x4 Adriamycin and Cytoxan, Keytrudadose dense 4 followed by followed by Beryle Flock maintenance for 1 year 2. Followed bymastectomywith targeted axillary dissection 3. Followed by adjuvant radiation therapy ---------------------------------------------------------------------------------------------------------------------------  Current Treatment: Cycle 10 weekly Taxol (carboplatin and Keytruda every 3 weeks) Labs reviewed  Sydney Rios is here today out of concern for her swelling.  I reviewed the NCCN guidelines for supportive therapy side effects with checkpoint inhibitors.  There is no clear guideline indication for the abnormal thyroid outside of what is already being done.  I recommended that she restart her thyroid medication as her heart rate was likely decreased due to thyroid issue.  She let me know that she plans on doing this.  Sydney Rios is not having any other cardiac related symptoms such as chest pain shortness of breath on exertion, or paroxysmal nocturnal dyspnea.  The swelling is not related to her heart.  It could be related to the chemo, I question whether or not it is related to the thyroid issue.  I did write for her to take Lasix 20 mg every  other day.  She returns 02/23/2022 for labs follow-up and her next chemotherapy

## 2022-02-05 ENCOUNTER — Ambulatory Visit: Payer: Managed Care, Other (non HMO)

## 2022-02-05 ENCOUNTER — Inpatient Hospital Stay: Payer: 59

## 2022-02-05 VITALS — BP 104/69 | HR 64 | Temp 98.5°F | Resp 18

## 2022-02-05 DIAGNOSIS — Z95828 Presence of other vascular implants and grafts: Secondary | ICD-10-CM

## 2022-02-05 DIAGNOSIS — Z5112 Encounter for antineoplastic immunotherapy: Secondary | ICD-10-CM | POA: Diagnosis not present

## 2022-02-05 MED ORDER — FILGRASTIM-SNDZ 480 MCG/0.8ML IJ SOSY
480.0000 ug | PREFILLED_SYRINGE | Freq: Once | INTRAMUSCULAR | Status: AC
  Administered 2022-02-05: 480 ug via SUBCUTANEOUS
  Filled 2022-02-05: qty 0.8

## 2022-02-06 ENCOUNTER — Other Ambulatory Visit: Payer: Self-pay | Admitting: Adult Health

## 2022-02-07 ENCOUNTER — Encounter: Payer: Self-pay | Admitting: Adult Health

## 2022-02-08 ENCOUNTER — Other Ambulatory Visit: Payer: Self-pay | Admitting: Hematology and Oncology

## 2022-02-08 ENCOUNTER — Other Ambulatory Visit: Payer: Self-pay

## 2022-02-08 MED ORDER — LEVOTHYROXINE SODIUM 50 MCG PO TABS: 50.0000 ug | ORAL_TABLET | Freq: Every day | ORAL | 0 refills | Status: DC

## 2022-02-09 ENCOUNTER — Ambulatory Visit: Payer: Managed Care, Other (non HMO)

## 2022-02-10 ENCOUNTER — Inpatient Hospital Stay: Payer: 59

## 2022-02-10 ENCOUNTER — Ambulatory Visit: Payer: Managed Care, Other (non HMO)

## 2022-02-10 ENCOUNTER — Other Ambulatory Visit: Payer: Self-pay | Admitting: Hematology and Oncology

## 2022-02-10 DIAGNOSIS — Z171 Estrogen receptor negative status [ER-]: Secondary | ICD-10-CM

## 2022-02-10 DIAGNOSIS — C50411 Malignant neoplasm of upper-outer quadrant of right female breast: Secondary | ICD-10-CM

## 2022-02-11 ENCOUNTER — Encounter: Payer: Self-pay | Admitting: Hematology and Oncology

## 2022-02-11 ENCOUNTER — Inpatient Hospital Stay: Payer: 59

## 2022-02-11 ENCOUNTER — Ambulatory Visit: Payer: Managed Care, Other (non HMO)

## 2022-02-12 ENCOUNTER — Encounter: Payer: Self-pay | Admitting: Hematology and Oncology

## 2022-02-12 ENCOUNTER — Other Ambulatory Visit: Payer: Self-pay

## 2022-02-12 ENCOUNTER — Inpatient Hospital Stay: Payer: 59

## 2022-02-12 DIAGNOSIS — C50411 Malignant neoplasm of upper-outer quadrant of right female breast: Secondary | ICD-10-CM

## 2022-02-12 NOTE — Progress Notes (Signed)
Received call from patient regarding applying for [employer].  Based on verbal income guidelines provided, patient's household exceeds guidelines.   She asked about any other programs. Advised her the social workers have outside foundations which may be available. Staff message sent to Ander Purpura and Sharyn Lull in [place],  Asked patient about deductible and OOP. She states she has new plan this year and it is not as good. Advised I would review for available copay assistance and if there is any available, I will enroll her in the programs. She verbalized understanding.  She has my contact name and number for any additional financial questions or concerns.

## 2022-02-12 NOTE — Progress Notes (Signed)
Began working on [employer] for [employer].

## 2022-02-12 NOTE — Progress Notes (Signed)
Received approval letter from [employer] for copay for [place].  Eligibility dates 02/12/22 - 12/26/22 with earliest claim date of 10/15/21.

## 2022-02-12 NOTE — Progress Notes (Signed)
Enrolled patient in copay program for [place] via [employer].  Patient approved for $10,000 leaving her with a $0 copay after insurance pays their portion.  She will receive a copy of the approval letter in the mail for her records only.  Copay card information with [facility] for billing/claim submissions.  Information entered in [facility] as well.

## 2022-02-15 ENCOUNTER — Other Ambulatory Visit: Payer: Self-pay | Admitting: Adult Health

## 2022-02-15 ENCOUNTER — Encounter: Payer: Self-pay | Admitting: Licensed Clinical Social Worker

## 2022-02-15 DIAGNOSIS — Z171 Estrogen receptor negative status [ER-]: Secondary | ICD-10-CM

## 2022-02-15 NOTE — Progress Notes (Signed)
[facility] Work  Clinical Social Work was referred by [facility] for assessment of psychosocial needs.  Clinical Social Worker contacted patient by phone  to offer support and assess for needs.    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 Husband is working, but household income is reduced and they are utilizing savings (6 people in household).  Patient is working on Land for [employer] and is interested in any other resources. CSW gave information for [employer] and for [place].  CSW will meet with pt in infusion on 2/28 to finalize and submit applications.     [place], [place] Worker [employer]

## 2022-02-16 ENCOUNTER — Encounter: Payer: Self-pay | Admitting: Hematology and Oncology

## 2022-02-16 ENCOUNTER — Other Ambulatory Visit: Payer: 59

## 2022-02-16 ENCOUNTER — Ambulatory Visit: Payer: 59 | Admitting: Hematology and Oncology

## 2022-02-16 ENCOUNTER — Ambulatory Visit: Payer: 59

## 2022-02-17 ENCOUNTER — Encounter: Payer: Self-pay | Admitting: Licensed Clinical Social Worker

## 2022-02-17 ENCOUNTER — Ambulatory Visit: Payer: 59

## 2022-02-17 ENCOUNTER — Ambulatory Visit: Payer: Managed Care, Other (non HMO)

## 2022-02-17 NOTE — Progress Notes (Signed)
[facility] CSW Progress Note  Clinical Education officer, museum received request from patient for letter confirming diagnosis and plan for [place] application. CSW completed letter and sent to patient.    Christeen Douglas , LCSW

## 2022-02-18 ENCOUNTER — Ambulatory Visit: Payer: 59

## 2022-02-18 ENCOUNTER — Other Ambulatory Visit: Payer: Self-pay | Admitting: Hematology and Oncology

## 2022-02-22 MED FILL — Dexamethasone Sodium Phosphate Inj 100 MG/10ML: INTRAMUSCULAR | Qty: 1 | Status: AC

## 2022-02-22 MED FILL — Fosaprepitant Dimeglumine For IV Infusion 150 MG (Base Eq): INTRAVENOUS | Qty: 5 | Status: AC

## 2022-02-23 ENCOUNTER — Encounter: Payer: Self-pay | Admitting: Adult Health

## 2022-02-23 ENCOUNTER — Other Ambulatory Visit: Payer: Self-pay

## 2022-02-23 ENCOUNTER — Inpatient Hospital Stay: Payer: 59

## 2022-02-23 ENCOUNTER — Inpatient Hospital Stay (HOSPITAL_BASED_OUTPATIENT_CLINIC_OR_DEPARTMENT_OTHER): Payer: 59 | Admitting: Adult Health

## 2022-02-23 ENCOUNTER — Encounter: Payer: Self-pay | Admitting: Hematology and Oncology

## 2022-02-23 ENCOUNTER — Inpatient Hospital Stay: Payer: 59 | Admitting: Licensed Clinical Social Worker

## 2022-02-23 VITALS — BP 120/62 | HR 91 | Temp 97.5°F | Resp 18 | Ht 67.0 in | Wt 162.6 lb

## 2022-02-23 DIAGNOSIS — Z5112 Encounter for antineoplastic immunotherapy: Secondary | ICD-10-CM | POA: Diagnosis not present

## 2022-02-23 DIAGNOSIS — C50411 Malignant neoplasm of upper-outer quadrant of right female breast: Secondary | ICD-10-CM

## 2022-02-23 DIAGNOSIS — Z171 Estrogen receptor negative status [ER-]: Secondary | ICD-10-CM | POA: Diagnosis not present

## 2022-02-23 DIAGNOSIS — Z95828 Presence of other vascular implants and grafts: Secondary | ICD-10-CM

## 2022-02-23 LAB — CBC WITH DIFFERENTIAL (CANCER CENTER ONLY)
Abs Immature Granulocytes: 0 10*3/uL (ref 0.00–0.07)
Basophils Absolute: 0 10*3/uL (ref 0.0–0.1)
Basophils Relative: 1 %
Eosinophils Absolute: 0 10*3/uL (ref 0.0–0.5)
Eosinophils Relative: 1 %
HCT: 31.5 % — ABNORMAL LOW (ref 36.0–46.0)
Hemoglobin: 10.4 g/dL — ABNORMAL LOW (ref 12.0–15.0)
Immature Granulocytes: 0 %
Lymphocytes Relative: 36 %
Lymphs Abs: 1.1 10*3/uL (ref 0.7–4.0)
MCH: 32.9 pg (ref 26.0–34.0)
MCHC: 33 g/dL (ref 30.0–36.0)
MCV: 99.7 fL (ref 80.0–100.0)
Monocytes Absolute: 0.3 10*3/uL (ref 0.1–1.0)
Monocytes Relative: 9 %
Neutro Abs: 1.6 10*3/uL — ABNORMAL LOW (ref 1.7–7.7)
Neutrophils Relative %: 53 %
Platelet Count: 166 10*3/uL (ref 150–400)
RBC: 3.16 MIL/uL — ABNORMAL LOW (ref 3.87–5.11)
RDW: 18.8 % — ABNORMAL HIGH (ref 11.5–15.5)
WBC Count: 3 10*3/uL — ABNORMAL LOW (ref 4.0–10.5)
nRBC: 0 % (ref 0.0–0.2)

## 2022-02-23 LAB — CMP (CANCER CENTER ONLY)
ALT: 11 U/L (ref 0–44)
AST: 15 U/L (ref 15–41)
Albumin: 4.4 g/dL (ref 3.5–5.0)
Alkaline Phosphatase: 61 U/L (ref 38–126)
Anion gap: 3 — ABNORMAL LOW (ref 5–15)
BUN: 14 mg/dL (ref 6–20)
CO2: 33 mmol/L — ABNORMAL HIGH (ref 22–32)
Calcium: 9.8 mg/dL (ref 8.9–10.3)
Chloride: 104 mmol/L (ref 98–111)
Creatinine: 0.88 mg/dL (ref 0.44–1.00)
GFR, Estimated: >60 mL/min (ref >60)
Glucose, Bld: 113 mg/dL — ABNORMAL HIGH (ref 70–99)
Potassium: 3.9 mmol/L (ref 3.5–5.1)
Sodium: 140 mmol/L (ref 135–145)
Total Bilirubin: 0.4 mg/dL (ref 0.3–1.2)
Total Protein: 6.9 g/dL (ref 6.5–8.1)

## 2022-02-23 LAB — TSH: TSH: 98.273 u[IU]/mL — ABNORMAL HIGH (ref 0.308–3.960)

## 2022-02-23 MED ORDER — SODIUM CHLORIDE 0.9% FLUSH
10.0000 mL | INTRAVENOUS | Status: DC | PRN
  Administered 2022-02-23: 10 mL

## 2022-02-23 MED ORDER — SODIUM CHLORIDE 0.9 % IV SOLN
600.0000 mg/m2 | Freq: Once | INTRAVENOUS | Status: AC
  Administered 2022-02-23: 1080 mg via INTRAVENOUS
  Filled 2022-02-23: qty 54

## 2022-02-23 MED ORDER — DOXORUBICIN HCL CHEMO IV INJECTION 2 MG/ML
60.0000 mg/m2 | Freq: Once | INTRAVENOUS | Status: AC
  Administered 2022-02-23: 108 mg via INTRAVENOUS
  Filled 2022-02-23: qty 54

## 2022-02-23 MED ORDER — PALONOSETRON HCL INJECTION 0.25 MG/5ML
0.2500 mg | Freq: Once | INTRAVENOUS | Status: AC
  Administered 2022-02-23: 0.25 mg via INTRAVENOUS
  Filled 2022-02-23: qty 5

## 2022-02-23 MED ORDER — SODIUM CHLORIDE 0.9 % IV SOLN
150.0000 mg | Freq: Once | INTRAVENOUS | Status: AC
  Administered 2022-02-23: 150 mg via INTRAVENOUS
  Filled 2022-02-23: qty 150

## 2022-02-23 MED ORDER — SODIUM CHLORIDE 0.9 % IV SOLN
10.0000 mg | Freq: Once | INTRAVENOUS | Status: AC
  Administered 2022-02-23: 10 mg via INTRAVENOUS
  Filled 2022-02-23: qty 10

## 2022-02-23 MED ORDER — HEPARIN SOD (PORK) LOCK FLUSH 100 UNIT/ML IV SOLN
500.0000 [IU] | Freq: Once | INTRAVENOUS | Status: AC | PRN
  Administered 2022-02-23: 500 [IU]

## 2022-02-23 MED ORDER — SODIUM CHLORIDE 0.9% FLUSH
10.0000 mL | Freq: Once | INTRAVENOUS | Status: AC
  Administered 2022-02-23: 10 mL

## 2022-02-23 MED ORDER — SODIUM CHLORIDE 0.9 % IV SOLN
200.0000 mg | Freq: Once | INTRAVENOUS | Status: AC
  Administered 2022-02-23: 200 mg via INTRAVENOUS
  Filled 2022-02-23: qty 200

## 2022-02-23 MED ORDER — SODIUM CHLORIDE 0.9 % IV SOLN: Freq: Once | INTRAVENOUS | Status: AC

## 2022-02-23 NOTE — Progress Notes (Signed)
[facility] CSW Progress Note  Holiday representative met with patient to follow-up on [employer] and other resources.  Patient provided [employer] documents. Waiting on one bank statement before submitting.  CSW signed up pt for [employer] and gave first disbursement.    Discussed [employer] as potential support for pt's children (ages 75, 79, 16, 3). CSW also gave information on [employer] and [employer] for additional support.    Christeen Douglas LCSW

## 2022-02-23 NOTE — Patient Instructions (Signed)
[facility]  Discharge Instructions: Thank you for choosing [facility] to provide your oncology and hematology care.   If you have a lab appointment with the [facility], please go directly to the [facility] and check in at the registration area.   Wear comfortable clothing and clothing appropriate for easy access to any Portacath or PICC line.   We strive to give you quality time with your provider. You may need to reschedule your appointment if you arrive late (15 or more minutes).  Arriving late affects you and other patients whose appointments are after yours.  Also, if you miss three or more appointments without notifying the office, you may be dismissed from the clinic at the providers discretion.      For prescription refill requests, have your pharmacy contact our office and allow 72 hours for refills to be completed.    Today you received the following chemotherapy and/or immunotherapy agents Keytruda, Adriamycin, Cytoxan      To help prevent nausea and vomiting after your treatment, we encourage you to take your nausea medication as directed.  BELOW ARE SYMPTOMS THAT SHOULD BE REPORTED IMMEDIATELY: *FEVER GREATER THAN 100.4 F (38 C) OR HIGHER *CHILLS OR SWEATING *NAUSEA AND VOMITING THAT IS NOT CONTROLLED WITH YOUR NAUSEA MEDICATION *UNUSUAL SHORTNESS OF BREATH *UNUSUAL BRUISING OR BLEEDING *URINARY PROBLEMS (pain or burning when urinating, or frequent urination) *BOWEL PROBLEMS (unusual diarrhea, constipation, pain near the anus) TENDERNESS IN MOUTH AND THROAT WITH OR WITHOUT PRESENCE OF ULCERS (sore throat, sores in mouth, or a toothache) UNUSUAL RASH, SWELLING OR PAIN  UNUSUAL VAGINAL DISCHARGE OR ITCHING   Items with * indicate a potential emergency and should be followed up as soon as possible or go to the Emergency Department if any problems should occur.  Please show the CHEMOTHERAPY ALERT CARD or IMMUNOTHERAPY ALERT  CARD at check-in to the Emergency Department and triage nurse.  Should you have questions after your visit or need to cancel or reschedule your appointment, please contact [facility]  Dept: [phone]  and follow the prompts.  Office hours are 8:00 a.m. to 4:30 p.m. Monday - Friday. Please note that voicemails left after 4:00 p.m. may not be returned until the following business day.  We are closed weekends and major holidays. You have access to a nurse at all times for urgent questions. Please call the main number to the clinic Dept: [phone] and follow the prompts.   For any non-urgent questions, you may also contact your provider using MyChart. We now offer e-Visits for anyone 89 and older to request care online for non-urgent symptoms. For details visit [email or web address].   Also download the MyChart app! Go to the app store, search "MyChart", open the app, select [facility], and log in with your MyChart username and password.  Due to Covid, a mask is required upon entering the hospital/clinic. If you do not have a mask, one will be given to you upon arrival. For doctor visits, patients may have 1 support person aged 21 or older with them. For treatment visits, patients cannot have anyone with them due to current Covid guidelines and our immunocompromised population.   Doxorubicin injection What is this medication? DOXORUBICIN (dox oh ROO bi sin) is a chemotherapy drug. It is used to treat many kinds of cancer like leukemia, lymphoma, neuroblastoma, sarcoma, and Wilms' tumor. It is also used to treat bladder cancer, breast cancer, lung cancer, ovarian cancer,  stomach cancer, and thyroid cancer. This medicine may be used for other purposes; ask your health care provider or pharmacist if you have questions. COMMON BRAND NAME(S): Adriamycin, Adriamycin PFS, Adriamycin RDF, Rubex What should I tell my care team before I take this medication? They need to  know if you have any of these conditions: heart disease history of low blood counts caused by a medicine liver disease recent or ongoing radiation therapy an unusual or allergic reaction to doxorubicin, other chemotherapy agents, other medicines, foods, dyes, or preservatives pregnant or trying to get pregnant breast-feeding How should I use this medication? This drug is given as an infusion into a vein. It is administered in a hospital or clinic by a specially trained health care professional. If you have pain, swelling, burning or any unusual feeling around the site of your injection, tell your health care professional right away. Talk to your pediatrician regarding the use of this medicine in children. Special care may be needed. Overdosage: If you think you have taken too much of this medicine contact a poison control center or emergency room at once. NOTE: This medicine is only for you. Do not share this medicine with others. What if I miss a dose? It is important not to miss your dose. Call your doctor or health care professional if you are unable to keep an appointment. What may interact with this medication? This medicine may interact with the following medications: 6-mercaptopurine paclitaxel phenytoin [facility]. John's Wort trastuzumab verapamil This list may not describe all possible interactions. Give your health care provider a list of all the medicines, herbs, non-prescription drugs, or dietary supplements you use. Also tell them if you smoke, drink alcohol, or use illegal drugs. Some items may interact with your medicine. What should I watch for while using this medication? This drug may make you feel generally unwell. This is not uncommon, as chemotherapy can affect healthy cells as well as cancer cells. Report any side effects. Continue your course of treatment even though you feel ill unless your doctor tells you to stop. There is a maximum amount of this medicine you should  receive throughout your life. The amount depends on the medical condition being treated and your overall health. Your doctor will watch how much of this medicine you receive in your lifetime. Tell your doctor if you have taken this medicine before. You may need blood work done while you are taking this medicine. Your urine may turn red for a few days after your dose. This is not blood. If your urine is dark or brown, call your doctor. In some cases, you may be given additional medicines to help with side effects. Follow all directions for their use. Call your doctor or health care professional for advice if you get a fever, chills or sore throat, or other symptoms of a cold or flu. Do not treat yourself. This drug decreases your body's ability to fight infections. Try to avoid being around people who are sick. This medicine may increase your risk to bruise or bleed. Call your doctor or health care professional if you notice any unusual bleeding. Talk to your doctor about your risk of cancer. You may be more at risk for certain types of cancers if you take this medicine. Do not become pregnant while taking this medicine or for 6 months after stopping it. Women should inform their doctor if they wish to become pregnant or think they might be pregnant. Men should not father a child while  taking this medicine and for 6 months after stopping it. There is a potential for serious side effects to an unborn child. Talk to your health care professional or pharmacist for more information. Do not breast-feed an infant while taking this medicine. This medicine has caused ovarian failure in some women and reduced sperm counts in some men This medicine may interfere with the ability to have a child. Talk with your doctor or health care professional if you are concerned about your fertility. This medicine may cause a decrease in Co-Enzyme Q-10. You should make sure that you get enough Co-Enzyme Q-10 while you are taking  this medicine. Discuss the foods you eat and the vitamins you take with your health care professional. What side effects may I notice from receiving this medication? Side effects that you should report to your doctor or health care professional as soon as possible: allergic reactions like skin rash, itching or hives, swelling of the face, lips, or tongue breathing problems chest pain fast or irregular heartbeat low blood counts - this medicine may decrease the number of white blood cells, red blood cells and platelets. You may be at increased risk for infections and bleeding. pain, redness, or irritation at site where injected signs of infection - fever or chills, cough, sore throat, pain or difficulty passing urine signs of decreased platelets or bleeding - bruising, pinpoint red spots on the skin, black, tarry stools, blood in the urine swelling of the ankles, feet, hands tiredness weakness Side effects that usually do not require medical attention (report to your doctor or health care professional if they continue or are bothersome): diarrhea hair loss mouth sores nail discoloration or damage nausea red colored urine vomiting This list may not describe all possible side effects. Call your doctor for medical advice about side effects. You may report side effects to FDA at 1-800-FDA-1088. Where should I keep my medication? This drug is given in a hospital or clinic and will not be stored at home. NOTE: This sheet is a summary. It may not cover all possible information. If you have questions about this medicine, talk to your doctor, pharmacist, or health care provider.  2022 Elsevier/Gold Standard (2017-08-18 00:00:00)  Cyclophosphamide Injection What is this medication? CYCLOPHOSPHAMIDE (sye kloe FOSS fa mide) is a chemotherapy drug. It slows the growth of cancer cells. This medicine is used to treat many types of cancer like lymphoma, myeloma, leukemia, breast cancer, and ovarian  cancer, to name a few. This medicine may be used for other purposes; ask your health care provider or pharmacist if you have questions. COMMON BRAND NAME(S): Cytoxan, Neosar What should I tell my care team before I take this medication? They need to know if you have any of these conditions: heart disease history of irregular heartbeat infection kidney disease liver disease low blood counts, like white cells, platelets, or red blood cells on hemodialysis recent or ongoing radiation therapy scarring or thickening of the lungs trouble passing urine an unusual or allergic reaction to cyclophosphamide, other medicines, foods, dyes, or preservatives pregnant or trying to get pregnant breast-feeding How should I use this medication? This drug is usually given as an injection into a vein or muscle or by infusion into a vein. It is administered in a hospital or clinic by a specially trained health care professional. Talk to your pediatrician regarding the use of this medicine in children. Special care may be needed. Overdosage: If you think you have taken too much of this medicine contact  a poison control center or emergency room at once. NOTE: This medicine is only for you. Do not share this medicine with others. What if I miss a dose? It is important not to miss your dose. Call your doctor or health care professional if you are unable to keep an appointment. What may interact with this medication? amphotericin B azathioprine certain antivirals for HIV or hepatitis certain medicines for blood pressure, heart disease, irregular heart beat certain medicines that treat or prevent blood clots like warfarin certain other medicines for cancer cyclosporine etanercept indomethacin medicines that relax muscles for surgery medicines to increase blood counts metronidazole This list may not describe all possible interactions. Give your health care provider a list of all the medicines, herbs,  non-prescription drugs, or dietary supplements you use. Also tell them if you smoke, drink alcohol, or use illegal drugs. Some items may interact with your medicine. What should I watch for while using this medication? Your condition will be monitored carefully while you are receiving this medicine. You may need blood work done while you are taking this medicine. Drink water or other fluids as directed. Urinate often, even at night. Some products may contain alcohol. Ask your health care professional if this medicine contains alcohol. Be sure to tell all health care professionals you are taking this medicine. Certain medicines, like metronidazole and disulfiram, can cause an unpleasant reaction when taken with alcohol. The reaction includes flushing, headache, nausea, vomiting, sweating, and increased thirst. The reaction can last from 30 minutes to several hours. Do not become pregnant while taking this medicine or for 1 year after stopping it. Women should inform their health care professional if they wish to become pregnant or think they might be pregnant. Men should not father a child while taking this medicine and for 4 months after stopping it. There is potential for serious side effects to an unborn child. Talk to your health care professional for more information. Do not breast-feed an infant while taking this medicine or for 1 week after stopping it. This medicine has caused ovarian failure in some women. This medicine may make it more difficult to get pregnant. Talk to your health care professional if you are concerned about your fertility. This medicine has caused decreased sperm counts in some men. This may make it more difficult to father a child. Talk to your health care professional if you are concerned about your fertility. Call your health care professional for advice if you get a fever, chills, or sore throat, or other symptoms of a cold or flu. Do not treat yourself. This medicine  decreases your body's ability to fight infections. Try to avoid being around people who are sick. Avoid taking medicines that contain aspirin, acetaminophen, ibuprofen, naproxen, or ketoprofen unless instructed by your health care professional. These medicines may hide a fever. Talk to your health care professional about your risk of cancer. You may be more at risk for certain types of cancer if you take this medicine. If you are going to need surgery or other procedure, tell your health care professional that you are using this medicine. Be careful brushing or flossing your teeth or using a toothpick because you may get an infection or bleed more easily. If you have any dental work done, tell your dentist you are receiving this medicine. What side effects may I notice from receiving this medication? Side effects that you should report to your doctor or health care professional as soon as possible: allergic reactions like  skin rash, itching or hives, swelling of the face, lips, or tongue breathing problems nausea, vomiting signs and symptoms of bleeding such as bloody or black, tarry stools; red or dark brown urine; spitting up blood or brown material that looks like coffee grounds; red spots on the skin; unusual bruising or bleeding from the eyes, gums, or nose signs and symptoms of heart failure like fast, irregular heartbeat, sudden weight gain; swelling of the ankles, feet, hands signs and symptoms of infection like fever; chills; cough; sore throat; pain or trouble passing urine signs and symptoms of kidney injury like trouble passing urine or change in the amount of urine signs and symptoms of liver injury like dark yellow or brown urine; general ill feeling or flu-like symptoms; light-colored stools; loss of appetite; nausea; right upper belly pain; unusually weak or tired; yellowing of the eyes or skin Side effects that usually do not require medical attention (report to your doctor or health  care professional if they continue or are bothersome): confusion decreased hearing diarrhea facial flushing hair loss headache loss of appetite missed menstrual periods signs and symptoms of low red blood cells or anemia such as unusually weak or tired; feeling faint or lightheaded; falls skin discoloration This list may not describe all possible side effects. Call your doctor for medical advice about side effects. You may report side effects to FDA at 1-800-FDA-1088. Where should I keep my medication? This drug is given in a hospital or clinic and will not be stored at home. NOTE: This sheet is a summary. It may not cover all possible information. If you have questions about this medicine, talk to your doctor, pharmacist, or health care provider.  2022 Elsevier/Gold Standard (2021-09-01 00:00:00)

## 2022-02-23 NOTE — Assessment & Plan Note (Addendum)
11/04/2021:Patient has been breast-feeding. Felt a painful right breast mass and an axillary mass. 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 Neoadjuvant chemotherapy with Taxol weekly 12 withKeytruda andcarboplatin every 3 weeks x4 Adriamycin and Cytoxan, Keytrudadose dense 4 followed by followed by Beryle Flock maintenance for 1 year 2. Followed bymastectomywith targeted axillary dissection 3. Followed by adjuvant radiation therapy ---------------------------------------------------------------------------------------------------------------------------  Current Treatment: Adriamycin, Cytoxan, Keytruda Labs reviewed H[facility] Dyer will start treatment with the above today.  I reviewed her labs with her which are normal.  She knows how to take her antiemetics.  Regards to the swelling is likely related to the Taxol and the thyroid.  She was recommended to continue on the Synthroid.  We will continue to follow.  In regards to the right breast changed over to an ultrasound and she wanted to hold off on that until she talk to her husband.  I did review that it may give [place] some insight into the breast and let us know where were at in regards to her treatment, however our treatment plan stays the same regardless of what the results show.   Bryli will return in 1 week for labs and follow-up with Dr. Lindi Adie.

## 2022-02-23 NOTE — Progress Notes (Signed)
[facility] Follow up:    [name], MD [address] [place] [address]   DIAGNOSIS:  Cancer Staging  Malignant neoplasm of upper-outer quadrant of right breast in female, estrogen receptor negative ([place]) Staging form: Breast, AJCC 8th Edition - Clinical stage from 11/11/2021: Stage IIIA (cT3, cN1, cM0, G3, ER+, PR+, HER2-) - Signed by Nicholas Lose, MD on 02/18/2022 Stage prefix: Initial diagnosis Histologic grading system: 3 grade system   SUMMARY OF ONCOLOGIC HISTORY: Oncology History  Malignant neoplasm of upper-outer quadrant of right breast in female, estrogen receptor negative ([place])  11/04/2021 Initial Diagnosis    Patient has been breast-feeding.  Felt a painful right breast mass and an axillary mass.  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 with a Ki-67 of 40%, 1 abnormal lymph node 5.8 cm biopsy: Grade 3 IDC ER 5%, PR 0%, HER2 negative, Ki-67 40%   11/11/2021 Cancer Staging   Staging form: Breast, AJCC 8th Edition - Clinical stage from 11/11/2021: Stage IIIA (cT3, cN1, cM0, G3, ER+, PR+, HER2-) - Signed by Nicholas Lose, MD on 02/18/2022 Stage prefix: Initial diagnosis Histologic grading system: 3 grade system    11/30/2021 -  Chemotherapy   Patient is on Treatment Plan : BREAST Pembrolizumab + Carboplatin D1 + Paclitaxel D1,8,15 q21d X 4 cycles / Pembrolizumab + AC q21d x 4 cycles     11/30/2021 Genetic Testing   Negative genetic testing on the CancerNext-Expanded+RNA insight panel.  The report date is 11/30/2021  The CancerNext-Expanded gene panel offered by [facility] and includes sequencing and rearrangement analysis for the following 77 genes: AIP, ALK, APC*, ATM*, AXIN2, BAP1, BARD1, BLM, BMPR1A, BRCA1*, BRCA2*, BRIP1*, CDC73, CDH1*, CDK4, CDKN1B, CDKN2A, CHEK2*, CTNNA1, DICER1, FANCC, FH, FLCN, GALNT12, KIF1B, LZTR1, MAX, MEN1, MET, MLH1*, MSH2*, MSH3, MSH6*, MUTYH*, NBN, NF1*,  NF2, NTHL1, PALB2*, PHOX2B, PMS2*, POT1, PRKAR1A, PTCH1, PTEN*, RAD51C*, RAD51D*, RB1, RECQL, RET, SDHA, SDHAF2, SDHB, SDHC, SDHD, SMAD4, SMARCA4, SMARCB1, SMARCE1, STK11, SUFU, TMEM127, TP53*, TSC1, TSC2, VHL and XRCC2 (sequencing and deletion/duplication); EGFR, EGLN1, HOXB13, KIT, MITF, PDGFRA, POLD1, and POLE (sequencing only); EPCAM and GREM1 (deletion/duplication only). DNA and RNA analyses performed for * genes.      CURRENT THERAPY: Adriamycin Cytoxan and Keytruda.  INTERVAL HISTORY: CASSADIE PANKONIN 41 y.o. female returns for evaluation prior to receiving treatment with Adriamycin Cytoxan and Keytruda.  She is going to receive her first dose of this.  With the [facility] she has had some hypothyroidism.  Dr. Lindi Adie has switched her medication to Synthroid.  She is started taking this and her swelling is somewhat better along with her heart rate.  She noted breast change in the right upper outer breast and is slightly worried about it.   Patient Active Problem List   Diagnosis Date Noted   Genetic testing 12/01/2021   Port-A-Cath in place 11/30/2021   Malignant neoplasm of upper-outer quadrant of right breast in female, estrogen receptor negative ([place]) 11/11/2021   Abnormal maternal glucose tolerance, antepartum 09/15/2018   S/P cesarean section 10/11/2014   TACHYCARDIA 05/01/2010    has No Known Allergies.  MEDICAL HISTORY: Past Medical History:  Diagnosis Date   Anxiety    Chronic kidney disease    hx kidney stones   Complication of anesthesia    runs heart rate in the 30-40s in PACU   History of kidney stones    Hx gestational diabetes    Hx  of Hashimoto thyroiditis    PONV (postoperative nausea and vomiting)     SURGICAL HISTORY: Past Surgical History:  Procedure Laterality Date   CESAREAN SECTION     CESAREAN SECTION  01/23/2013   Procedure: CESAREAN SECTION;  Surgeon: Luz Lex, MD;  Location: [facility];  Service: Obstetrics;  Laterality: N/A;   CESAREAN SECTION  N/A 10/11/2014   Procedure: CESAREAN SECTION;  Surgeon: Cyril Mourning, MD;  Location: [facility];  Service: Obstetrics;  Laterality: N/A;   CESAREAN SECTION N/A 10/24/2018   Procedure: REPEAT CESAREAN SECTION;  Surgeon: Dian Queen, MD;  Location: [facility];  Service: Obstetrics;  Laterality: N/A;  Tracey RNFA   LITHOTRIPSY     PORTACATH PLACEMENT Left 11/25/2021   Procedure: PORT PLACEMENT;  Surgeon: Rolm Bookbinder, MD;  Location: [facility];  Service: General;  Laterality: Left;   WISDOM TOOTH EXTRACTION      SOCIAL HISTORY: Social History   Socioeconomic History   Marital status: Married    Spouse name: Not on file   Number of children: Not on file   Years of education: Not on file   Highest education level: Not on file  Occupational History   Not on file  Tobacco Use   Smoking status: Never   Smokeless tobacco: Never  Vaping Use   Vaping Use: Never used  Substance and Sexual Activity   Alcohol use: No   Drug use: No   Sexual activity: Yes    Birth control/protection: Other-see comments    Comment: Husband has had a vasectomy  Other Topics Concern   Not on file  Social History Narrative   Not on file   Social Determinants of Health   Financial Resource Strain: Not on file  Food Insecurity: Not on file  Transportation Needs: Not on file  Physical Activity: Not on file  Stress: Not on file  Social Connections: Not on file  Intimate Partner Violence: Not on file    FAMILY HISTORY: Family History  Problem Relation Age of Onset   Hypertension Mother    Diabetes Mother    Hypertension Father    Hypertension Maternal Grandmother    Diabetes Maternal Grandmother    Heart disease Maternal Grandfather    Hypertension Maternal Grandfather    Heart disease Paternal Grandfather    Hypertension Paternal Grandfather    Retinoblastoma Cousin        dx 3 mo - left eye    Review of Systems  Constitutional:  Positive for fatigue. Negative for appetite  change, chills, fever and unexpected weight change.  HENT:   Negative for hearing loss, lump/mass and trouble swallowing.   Eyes:  Negative for eye problems and icterus.  Respiratory:  Negative for chest tightness, cough and shortness of breath.   Cardiovascular:  Negative for chest pain, leg swelling and palpitations.  Gastrointestinal:  Negative for abdominal distention, abdominal pain, constipation, diarrhea, nausea and vomiting.  Endocrine: Negative for hot flashes.  Genitourinary:  Negative for difficulty urinating.   Musculoskeletal:  Negative for arthralgias.  Skin:  Negative for itching and rash.  Neurological:  Negative for dizziness, extremity weakness, headaches and numbness.  Hematological:  Negative for adenopathy. Does not bruise/bleed easily.  Psychiatric/Behavioral:  Negative for depression. The patient is not nervous/anxious.      PHYSICAL EXAMINATION  ECOG PERFORMANCE STATUS: 1 - Symptomatic but completely ambulatory  Vitals:   02/23/22 1244  BP: 120/62  Pulse: 91  Resp: 18  Temp: (!) 97.5 F (36.4  C)  SpO2: 100%    Physical Exam Constitutional:      General: She is not in acute distress.    Appearance: Normal appearance. She is not toxic-appearing.  HENT:     Head: Normocephalic and atraumatic.  Eyes:     General: No scleral icterus. Cardiovascular:     Rate and Rhythm: Normal rate and regular rhythm.     Pulses: Normal pulses.     Heart sounds: Normal heart sounds.  Pulmonary:     Effort: Pulmonary effort is normal.     Breath sounds: Normal breath sounds.  Chest:     Comments: In face-to-face visit time, chart review, lab review, care coordination, order entry, and documentation of the encounter. Abdominal:     General: Abdomen is flat. Bowel sounds are normal. There is no distension.     Palpations: Abdomen is soft.     Tenderness: There is no abdominal tenderness.  Musculoskeletal:        General: No swelling.     Cervical back: Neck supple.   Lymphadenopathy:     Cervical: No cervical adenopathy.  Skin:    General: Skin is warm and dry.     Findings: No rash.  Neurological:     General: No focal deficit present.     Mental Status: She is alert.  Psychiatric:        Mood and Affect: Mood normal.        Behavior: Behavior normal.    LABORATORY DATA:  CBC    Component Value Date/Time   WBC 3.0 (L) 02/23/2022 1208   WBC 9.6 10/25/2018 0515   RBC 3.16 (L) 02/23/2022 1208   HGB 10.4 (L) 02/23/2022 1208   HCT 31.5 (L) 02/23/2022 1208   PLT 166 02/23/2022 1208   MCV 99.7 02/23/2022 1208   MCH 32.9 02/23/2022 1208   MCHC 33.0 02/23/2022 1208   RDW 18.8 (H) 02/23/2022 1208   LYMPHSABS 1.1 02/23/2022 1208   MONOABS 0.3 02/23/2022 1208   EOSABS 0.0 02/23/2022 1208   BASOSABS 0.0 02/23/2022 1208    CMP     Component Value Date/Time   NA 140 02/23/2022 1208   K 3.9 02/23/2022 1208   CL 104 02/23/2022 1208   CO2 33 (H) 02/23/2022 1208   GLUCOSE 113 (H) 02/23/2022 1208   BUN 14 02/23/2022 1208   CREATININE 0.88 02/23/2022 1208   CALCIUM 9.8 02/23/2022 1208   PROT 6.9 02/23/2022 1208   ALBUMIN 4.4 02/23/2022 1208   AST 15 02/23/2022 1208   ALT 11 02/23/2022 1208   ALKPHOS 61 02/23/2022 1208   BILITOT 0.4 02/23/2022 1208   GFRNONAA >60 02/23/2022 1208   GFRAA >60 10/22/2018 2142       ASSESSMENT and THERAPY PLAN:   Malignant neoplasm of upper-outer quadrant of right breast in female, estrogen receptor negative ([place]) 11/04/2021: Patient has been breast-feeding.  Felt a painful right breast mass and an axillary mass.  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 with a Ki-67 of 40%, 1 abnormal lymph node 5.8 cm biopsy: Grade 3 IDC ER 5%, PR 0%, HER2 negative, Ki-67 40%   CT CAP 11/20/2021: Right breast cancer with axillary lymph nodes, mildly enlarged right retropectoral lymph nodes, right internal mammary lymph nodes Breast MRI 11/17/2021: Known breast cancer 2 cm,  right axillary lymph node, non-mass enhancement 7 cm, borderline enlarged right internal mammary lymph nodes   Treatment Plan: based on multidisciplinary tumor  board: 1. Neoadjuvant chemotherapy with Taxol weekly 12 with Keytruda and carboplatin every 3 weeks x4 Adriamycin and Cytoxan, Keytruda dose dense 4 followed by  followed by Beryle Flock maintenance for 1 year 2. Followed by mastectomy with targeted axillary dissection 3. Followed by adjuvant radiation therapy  ---------------------------------------------------------------------------------------------------------------------------   Current Treatment: Adriamycin, Cytoxan, Keytruda Labs reviewed Hinton Dyer will start treatment with the above today.  I reviewed her labs with her which are normal.  She knows how to take her antiemetics.  Regards to the swelling is likely related to the Taxol and the thyroid.  She was recommended to continue on the Synthroid.  We will continue to follow.  In regards to the right breast changed over to an ultrasound and she wanted to hold off on that until she talk to her husband.  I did review that it may give [place] some insight into the breast and let us know where were at in regards to her treatment, however our treatment plan stays the same regardless of what the results show.   Gloria will return in 1 week for labs and follow-up with Dr. Lindi Adie.    All questions were answered. The patient knows to call the clinic with any problems, questions or concerns. We can certainly see the patient much sooner if necessary.  Total encounter time: 30 minutes in face-to-face visit time, chart review, lab review, care coordination, order entry, and documentation of the encounter.   Wilber Bihari, NP 02/24/22 10:51 PM Medical Oncology and Hematology [facility] [place],  [address] Tel. [phone]    Fax. [phone]  *Total Encounter Time as defined by the Centers for Medicare and  Medicaid Services includes, in addition to the face-to-face time of a patient visit (documented in the note above) non-face-to-face time: obtaining and reviewing outside history, ordering and reviewing medications, tests or procedures, care coordination (communications with other health care professionals or caregivers) and documentation in the medical record.

## 2022-02-24 ENCOUNTER — Other Ambulatory Visit: Payer: Self-pay | Admitting: *Deleted

## 2022-02-24 ENCOUNTER — Telehealth: Payer: Self-pay | Admitting: *Deleted

## 2022-02-24 ENCOUNTER — Encounter: Payer: Self-pay | Admitting: Hematology and Oncology

## 2022-02-24 DIAGNOSIS — E039 Hypothyroidism, unspecified: Secondary | ICD-10-CM

## 2022-02-24 LAB — T4: T4, Total: 3.1 ug/dL — ABNORMAL LOW (ref 4.5–12.0)

## 2022-02-24 MED ORDER — LEVOTHYROXINE SODIUM 100 MCG PO TABS: 100.0000 ug | ORAL_TABLET | Freq: Every day | ORAL | 0 refills | Status: DC

## 2022-02-24 NOTE — Progress Notes (Signed)
RN reviewed recent TSH with MD.  Verbal orders received for pt to increase Levothyroxine to 100 mcg daily and to refer pt to Dr. Cruzita Lederer with [facility].  Prescription sent to pharmacy on file and referral successfully faxed [phone]). Pt educated and verbalized understanding.  ?

## 2022-02-24 NOTE — Telephone Encounter (Signed)
Left message earlier today to call us back to discuss how she did with her new treatment.  Never received return call.   ?

## 2022-02-25 ENCOUNTER — Inpatient Hospital Stay: Payer: 59 | Attending: Hematology and Oncology

## 2022-02-25 ENCOUNTER — Other Ambulatory Visit: Payer: Self-pay

## 2022-02-25 VITALS — BP 116/61 | HR 68 | Temp 98.5°F | Resp 18

## 2022-02-25 DIAGNOSIS — Z171 Estrogen receptor negative status [ER-]: Secondary | ICD-10-CM | POA: Diagnosis not present

## 2022-02-25 DIAGNOSIS — Z5112 Encounter for antineoplastic immunotherapy: Secondary | ICD-10-CM | POA: Diagnosis not present

## 2022-02-25 DIAGNOSIS — C50411 Malignant neoplasm of upper-outer quadrant of right female breast: Secondary | ICD-10-CM | POA: Diagnosis present

## 2022-02-25 DIAGNOSIS — E039 Hypothyroidism, unspecified: Secondary | ICD-10-CM | POA: Insufficient documentation

## 2022-02-25 DIAGNOSIS — Z5111 Encounter for antineoplastic chemotherapy: Secondary | ICD-10-CM | POA: Diagnosis present

## 2022-02-25 DIAGNOSIS — D701 Agranulocytosis secondary to cancer chemotherapy: Secondary | ICD-10-CM | POA: Diagnosis not present

## 2022-02-25 DIAGNOSIS — D6481 Anemia due to antineoplastic chemotherapy: Secondary | ICD-10-CM | POA: Diagnosis not present

## 2022-02-25 DIAGNOSIS — N898 Other specified noninflammatory disorders of vagina: Secondary | ICD-10-CM | POA: Insufficient documentation

## 2022-02-25 DIAGNOSIS — Z452 Encounter for adjustment and management of vascular access device: Secondary | ICD-10-CM | POA: Insufficient documentation

## 2022-02-25 MED ORDER — PEGFILGRASTIM-BMEZ 6 MG/0.6ML ~~LOC~~ SOSY
6.0000 mg | PREFILLED_SYRINGE | Freq: Once | SUBCUTANEOUS | Status: AC
  Administered 2022-02-25: 6 mg via SUBCUTANEOUS
  Filled 2022-02-25: qty 0.6

## 2022-02-28 ENCOUNTER — Other Ambulatory Visit: Payer: Self-pay | Admitting: Hematology and Oncology

## 2022-02-28 DIAGNOSIS — C50411 Malignant neoplasm of upper-outer quadrant of right female breast: Secondary | ICD-10-CM

## 2022-03-01 ENCOUNTER — Other Ambulatory Visit: Payer: Self-pay | Admitting: Adult Health

## 2022-03-01 DIAGNOSIS — C50411 Malignant neoplasm of upper-outer quadrant of right female breast: Secondary | ICD-10-CM

## 2022-03-01 NOTE — Progress Notes (Signed)
? ?Patient Care Team: ?[name], MD as PCP - General (Family Medicine) ? ?DIAGNOSIS:  ?  ICD-10-CM   ?1. Vaginal discharge  N89.8 Wet prep, genital  ?  ?2. Malignant neoplasm of upper-outer quadrant of right breast in female, estrogen receptor negative ([place])  C50.411 Wet prep, genital  ? Z17.1   ?  ? ? ?SUMMARY OF ONCOLOGIC HISTORY: ?Oncology History  ?Malignant neoplasm of upper-outer quadrant of right breast in female, estrogen receptor negative ([place])  ?11/04/2021 Initial Diagnosis  ?  Patient has been breast-feeding.  Felt a painful right breast mass and an axillary mass.  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 with a Ki-67 of 40%, 1 abnormal lymph node 5.8 cm biopsy: Grade 3 IDC ER 5%, PR 0%, HER2 negative, Ki-67 40% ?  ?11/11/2021 Cancer Staging  ? Staging form: Breast, AJCC 8th Edition ?- Clinical stage from 11/11/2021: Stage IIIA (cT3, cN1, cM0, G3, ER+, PR+, HER2-) - Signed by Nicholas Lose, MD on 02/18/2022 ?Stage prefix: Initial diagnosis ?Histologic grading system: 3 grade system ? ?  ?11/30/2021 -  Chemotherapy  ? Patient is on Treatment Plan : BREAST Pembrolizumab + Carboplatin D1 + Paclitaxel D1,8,15 q21d X 4 cycles / Pembrolizumab + AC q21d x 4 cycles  ?   ?11/30/2021 Genetic Testing  ? Negative genetic testing on the CancerNext-Expanded+RNA insight panel.  The report date is 11/30/2021 ? ?The CancerNext-Expanded gene panel offered by [facility] and includes sequencing and rearrangement analysis for the following 77 genes: AIP, ALK, APC*, ATM*, AXIN2, BAP1, BARD1, BLM, BMPR1A, BRCA1*, BRCA2*, BRIP1*, CDC73, CDH1*, CDK4, CDKN1B, CDKN2A, CHEK2*, CTNNA1, DICER1, FANCC, FH, FLCN, GALNT12, KIF1B, LZTR1, MAX, MEN1, MET, MLH1*, MSH2*, MSH3, MSH6*, MUTYH*, NBN, NF1*, NF2, NTHL1, PALB2*, PHOX2B, PMS2*, POT1, PRKAR1A, PTCH1, PTEN*, RAD51C*, RAD51D*, RB1, RECQL, RET, SDHA, SDHAF2, SDHB, SDHC, SDHD, SMAD4, SMARCA4, SMARCB1, SMARCE1, STK11, SUFU, TMEM127,  TP53*, TSC1, TSC2, VHL and XRCC2 (sequencing and deletion/duplication); EGFR, EGLN1, HOXB13, KIT, MITF, PDGFRA, POLD1, and POLE (sequencing only); EPCAM and GREM1 (deletion/duplication only). DNA and RNA analyses performed for * genes.  ?  ? ? ?CHIEF COMPLIANT: Cycle 6 Keytruda + AC ? ?INTERVAL HISTORY: Sydney Rios is a 41 y.o. with above-mentioned history of breast cancer, currently on neoadjuvant chemotherapy with Taxol. She presents to the clinic today for toxicity check.  ?She tolerated chemotherapy reasonably well.  She did have fatigue and mild nausea.  She also developed mild bone pain after the Neulasta injection. ? ?ALLERGIES:  has No Known Allergies. ? ?MEDICATIONS:  ?Current Outpatient Medications  ?Medication Sig Dispense Refill  ? levothyroxine (SYNTHROID) 100 MCG tablet Take 1 tablet (100 mcg total) by mouth daily before breakfast. 30 tablet 0  ? Ascorbic Acid (VITAMIN C) 1000 MG tablet Take 1,000 mg by mouth 2 (two) times daily as needed (immune support (if sick)).    ? Cholecalciferol (VITAMIN D3) 125 MCG (5000 UT) TABS Take 5,000 Units by mouth daily as needed (immune support (if sick)).    ? clindamycin (CLINDAGEL) 1 % gel Apply topically 2 (two) times daily. 30 g 0  ? furosemide (LASIX) 20 MG tablet TAKE 1/2-1 TAB EVERY OTHER DAY AS NEEDED 10 tablet 0  ? IVERMECTIN PO Take 20 mg by mouth in the morning.    ? lidocaine-prilocaine (EMLA) cream Apply to affected area once 30 g 3  ? Menaquinone-7 (VITAMIN K2) 100 MCG CAPS Take 100 mcg by mouth daily as needed (immune support (if  sick)).    ? NALTREXONE HCL PO Take 1 mg by mouth in the morning.    ? ondansetron (ZOFRAN) 8 MG tablet TAKE 1 TABLET (8 MG TOTAL) BY MOUTH 2 (TWO) TIMES DAILY AS NEEDED. START ON THE THIRD DAY AFTER CARBOPLATIN AND AC CHEMOTHERAPY. 30 tablet 1  ? [START ON 03/10/2022] pegfilgrastim-bmez (ZIEXTENZO) 6 MG/0.6ML injection Inject 0.6 mLs (6 mg total) into the skin once for 1 dose. 0.6 mL 2  ? prochlorperazine (COMPAZINE) 10 MG  tablet Take 1 tablet (10 mg total) by mouth every 6 (six) hours as needed (Nausea or vomiting). 30 tablet 1  ? valACYclovir (VALTREX) 1000 MG tablet Take 2 g by mouth 2 (two) times daily as needed (cold sore/fever blisters).    ? Zinc 50 MG TABS Take 50 mg by mouth daily as needed.    ? ?No current facility-administered medications for this visit.  ? ? ?PHYSICAL EXAMINATION: ?ECOG PERFORMANCE STATUS: 1 - Symptomatic but completely ambulatory ? ?Vitals:  ? 03/02/22 1116  ?BP: 117/66  ?Pulse: 95  ?Resp: 18  ?Temp: 97.7 ?F (36.5 ?C)  ?SpO2: 100%  ? ?Filed Weights  ? 03/02/22 1116  ?Weight: 163 lb 4.8 oz (74.1 kg)  ? ? ?LABORATORY DATA:  ?I have reviewed the data as listed ?CMP Latest Ref Rng & Units 02/23/2022 02/02/2022 01/26/2022  ?Glucose 70 - 99 mg/dL 113(H) 100(H) 110(H)  ?BUN 6 - 20 mg/dL $Remove'14 14 14  '[id number]$ ?Creatinine 0.44 - 1.00 mg/dL 0.88 0.72 0.75  ?Sodium 135 - 145 mmol/L 140 140 140  ?Potassium 3.5 - 5.1 mmol/L 3.9 4.2 3.8  ?Chloride 98 - 111 mmol/L 104 107 106  ?CO2 22 - 32 mmol/L 33(H) 27 28  ?Calcium 8.9 - 10.3 mg/dL 9.8 9.4 9.4  ?Total Protein 6.5 - 8.1 g/dL 6.9 6.2(L) 6.2(L)  ?Total Bilirubin 0.3 - 1.2 mg/dL 0.4 0.2(L) 0.2(L)  ?Alkaline Phos 38 - 126 U/L 61 62 53  ?AST 15 - 41 U/L 15 13(L) 29  ?ALT 0 - 44 U/L 11 19 32  ? ? ?Lab Results  ?Component Value Date  ? WBC 0.6 (LL) 03/02/2022  ? HGB 9.1 (L) 03/02/2022  ? HCT 27.9 (L) 03/02/2022  ? MCV 102.2 (H) 03/02/2022  ? PLT 130 (L) 03/02/2022  ? NEUTROABS 0.2 (LL) 03/02/2022  ? ? ?ASSESSMENT & PLAN:  ?Malignant neoplasm of upper-outer quadrant of right breast in female, estrogen receptor negative ([place]) ?11/04/2021: Patient has been breast-feeding.  Felt a painful right breast mass and an axillary mass.  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 with a Ki-67 of 40%, 1 abnormal lymph node 5.8 cm biopsy: Grade 3 IDC ER 5%, PR 0%, HER2 negative, Ki-67 40% ?  ?CT CAP 11/20/2021: Right breast cancer with axillary lymph nodes,  mildly enlarged right retropectoral lymph nodes, right internal mammary lymph nodes ?Breast MRI 11/17/2021: Known breast cancer 2 cm, right axillary lymph node, non-mass enhancement 7 cm, borderline enlarged right internal mammary lymph nodes ?  ?Treatment Plan: based on multidisciplinary tumor board: ?1. Neoadjuvant chemotherapy with Taxol weekly ?12 with Keytruda and carboplatin every 3 weeks x4 Adriamycin and Cytoxan, Keytruda dose dense ?4 followed by  followed by Beryle Flock maintenance for 1 year ?2. Followed by mastectomy with targeted axillary dissection ?3. Followed by adjuvant radiation therapy ? --------------------------------------------------------------------------------------------------------------------------- ?  ?Current Treatment: Completed 9 cycles of weekly Taxol (carboplatin and Keytruda every 3 weeks), today cycle 1 day 8 Adriamycin and Cytoxan with  Keytruda ?Labs reviewed ?  ?Chemo Toxicities: ?Hypothyroidism: TSH is pending ?Mild nausea ?Mild fatigue ?Neutropenia: Today's ANC 0.1: We will reduce the dosage of her next cycle of chemo. ?5.  Chemotherapy-induced anemia: Monitoring closely today's hemoglobin is 9.1 ? ?Return to clinic in 2 weeks for cycle 2 ? ? ? ?Orders Placed This Encounter  ?Procedures  ? Wet prep, genital  ? ?The patient has a good understanding of the overall plan. she agrees with it. she will call with any problems that may develop before the next visit here. ? ?Total time spent: 30 mins including face to face time and time spent for planning, charting and coordination of care ? ?Rulon Eisenmenger, MD, MPH ?03/02/2022 ? ?I, Thana Ates, am acting as scribe for Dr. Nicholas Lose. ? ?I have reviewed the above documentation for accuracy and completeness, and I agree with the above. ? ? ? ? ? ? ?

## 2022-03-02 ENCOUNTER — Inpatient Hospital Stay: Payer: 59

## 2022-03-02 ENCOUNTER — Other Ambulatory Visit: Payer: Self-pay

## 2022-03-02 ENCOUNTER — Inpatient Hospital Stay (HOSPITAL_BASED_OUTPATIENT_CLINIC_OR_DEPARTMENT_OTHER): Payer: 59 | Admitting: Hematology and Oncology

## 2022-03-02 ENCOUNTER — Other Ambulatory Visit: Payer: Self-pay | Admitting: *Deleted

## 2022-03-02 ENCOUNTER — Encounter: Payer: Self-pay | Admitting: *Deleted

## 2022-03-02 ENCOUNTER — Telehealth: Payer: Self-pay

## 2022-03-02 VITALS — BP 117/66 | HR 95 | Temp 97.7°F | Resp 18 | Ht 67.0 in | Wt 163.3 lb

## 2022-03-02 DIAGNOSIS — C50411 Malignant neoplasm of upper-outer quadrant of right female breast: Secondary | ICD-10-CM

## 2022-03-02 DIAGNOSIS — Z171 Estrogen receptor negative status [ER-]: Secondary | ICD-10-CM

## 2022-03-02 DIAGNOSIS — Z95828 Presence of other vascular implants and grafts: Secondary | ICD-10-CM

## 2022-03-02 DIAGNOSIS — N898 Other specified noninflammatory disorders of vagina: Secondary | ICD-10-CM | POA: Diagnosis not present

## 2022-03-02 DIAGNOSIS — Z5112 Encounter for antineoplastic immunotherapy: Secondary | ICD-10-CM | POA: Diagnosis not present

## 2022-03-02 LAB — CMP (CANCER CENTER ONLY)
ALT: 7 U/L (ref 0–44)
AST: 9 U/L — ABNORMAL LOW (ref 15–41)
Albumin: 4 g/dL (ref 3.5–5.0)
Alkaline Phosphatase: 59 U/L (ref 38–126)
Anion gap: 3 — ABNORMAL LOW (ref 5–15)
BUN: 14 mg/dL (ref 6–20)
CO2: 28 mmol/L (ref 22–32)
Calcium: 9.5 mg/dL (ref 8.9–10.3)
Chloride: 108 mmol/L (ref 98–111)
Creatinine: 0.58 mg/dL (ref 0.44–1.00)
GFR, Estimated: >60 mL/min (ref >60)
Glucose, Bld: 91 mg/dL (ref 70–99)
Potassium: 4.2 mmol/L (ref 3.5–5.1)
Sodium: 139 mmol/L (ref 135–145)
Total Bilirubin: 0.3 mg/dL (ref 0.3–1.2)
Total Protein: 6.4 g/dL — ABNORMAL LOW (ref 6.5–8.1)

## 2022-03-02 LAB — CBC WITH DIFFERENTIAL (CANCER CENTER ONLY)
Abs Immature Granulocytes: 0 10*3/uL (ref 0.00–0.07)
Basophils Absolute: 0 10*3/uL (ref 0.0–0.1)
Basophils Relative: 0 %
Eosinophils Absolute: 0 10*3/uL (ref 0.0–0.5)
Eosinophils Relative: 0 %
HCT: 27.9 % — ABNORMAL LOW (ref 36.0–46.0)
Hemoglobin: 9.1 g/dL — ABNORMAL LOW (ref 12.0–15.0)
Lymphocytes Relative: 60 %
Lymphs Abs: 0.4 10*3/uL — ABNORMAL LOW (ref 0.7–4.0)
MCH: 33.3 pg (ref 26.0–34.0)
MCHC: 32.6 g/dL (ref 30.0–36.0)
MCV: 102.2 fL — ABNORMAL HIGH (ref 80.0–100.0)
Monocytes Absolute: 0.1 10*3/uL (ref 0.1–1.0)
Monocytes Relative: 15 %
Neutro Abs: 0.2 10*3/uL — CL (ref 1.7–7.7)
Neutrophils Relative %: 25 %
Platelet Count: 130 10*3/uL — ABNORMAL LOW (ref 150–400)
RBC: 2.73 MIL/uL — ABNORMAL LOW (ref 3.87–5.11)
RDW: 16.9 % — ABNORMAL HIGH (ref 11.5–15.5)
WBC Count: 0.6 10*3/uL — CL (ref 4.0–10.5)
nRBC: 0 % (ref 0.0–0.2)

## 2022-03-02 LAB — WET PREP, GENITAL
Clue Cells Wet Prep HPF POC: NONE SEEN
Sperm: NONE SEEN
Trich, Wet Prep: NONE SEEN
WBC, Wet Prep HPF POC: <10 (ref <10)
Yeast Wet Prep HPF POC: NONE SEEN

## 2022-03-02 LAB — TSH: TSH: 39.752 u[IU]/mL — ABNORMAL HIGH (ref 0.308–3.960)

## 2022-03-02 MED ORDER — HEPARIN SOD (PORK) LOCK FLUSH 100 UNIT/ML IV SOLN
500.0000 [IU] | Freq: Once | INTRAVENOUS | Status: AC
  Administered 2022-03-02: 500 [IU]

## 2022-03-02 MED ORDER — SODIUM CHLORIDE 0.9% FLUSH
10.0000 mL | Freq: Once | INTRAVENOUS | Status: AC
  Administered 2022-03-02: 10 mL

## 2022-03-02 NOTE — Telephone Encounter (Signed)
Attempted to call pt to give wet prep results and educate on vaginal cleansing. Pt did not answer and her voicemail box is full.  ?

## 2022-03-02 NOTE — Telephone Encounter (Signed)
-----   Message from Gardenia Phlegm, NP sent at 03/02/2022  1:04 PM EST ----- ?Wet prep is negative for yeast or bacteria. ?----- Message ----- ?From: Interface, Lab In [place] ?Sent: 03/02/2022  12:16 PM EST ?To: Gardenia Phlegm, NP ? ? ?

## 2022-03-02 NOTE — Progress Notes (Signed)
CRITICAL VALUE STICKER ? ?CRITICAL VALUE: ANC 0.1 WBC 0.6 ? ?RECEIVER (on-site recipient of call): Merleen Nicely, RN ? ?DATE & TIME NOTIFIED: 03/02/22 at 1108 ? ?MESSENGER (representative from lab): Pam in the Lab ? ?MD NOTIFIED: Nicholas Lose, MD ? ?TIME OF NOTIFICATION: 03/02/22 at 1110 ? ?RESPONSE: MD notified and verbalized understanding. No orders received at this time.  ? ?

## 2022-03-02 NOTE — Assessment & Plan Note (Signed)
11/04/2021:?Patient has been breast-feeding. ?Felt a painful right breast mass and an axillary mass. ?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 with a Ki-67 of 40%, 1 abnormal lymph node 5.8 cm biopsy: Grade 3 IDC ER 5%, PR 0%, HER2 negative, Ki-67 40% ?? ?CT CAP 11/20/2021: Right breast cancer with axillary lymph nodes, mildly enlarged right retropectoral lymph nodes, right internal mammary lymph nodes ?Breast MRI 11/17/2021: Known breast cancer 2 cm, right axillary lymph node, non-mass enhancement 7 cm, borderline enlarged right internal mammary lymph nodes ?? ?Treatment Plan:?based on multidisciplinary tumor board: ?1. Neoadjuvant chemotherapy with Taxol weekly ?12 with?Keytruda and?carboplatin every 3 weeks x4 Adriamycin and Cytoxan, Keytruda?dose dense ?4 followed by ?followed by Keytruda maintenance for 1 year ?2. Followed by?mastectomy?with targeted axillary dissection ?3. Followed by adjuvant radiation therapy ??--------------------------------------------------------------------------------------------------------------------------- ?? ?Current Treatment: Completed 9 cycles of weekly?Taxol (carboplatin and Keytruda every 3 weeks), today cycle 3 of Adriamycin and Cytoxan with Keytruda ?Labs reviewed ?? ?Chemo Toxicities: ?1. Hypothyroidism: We increase the dosage of Synthroid checking TSH today ?

## 2022-03-02 NOTE — Progress Notes (Signed)
I obtained wet prep sample from patient's vagina.  She had a mild amount of white vaginal discharge.  This was sent for wet prep.  ? ?Wilber Bihari, NP 03/02/22 11:58 AM ?Medical Oncology and Hematology ?[place] ?[place] ?[place], [place] [address] ?Tel. [phone]    Fax. [phone] ? ?

## 2022-03-03 LAB — T4: T4, Total: 4.6 ug/dL (ref 4.5–12.0)

## 2022-03-04 ENCOUNTER — Encounter: Payer: Self-pay | Admitting: *Deleted

## 2022-03-08 ENCOUNTER — Other Ambulatory Visit: Payer: Self-pay | Admitting: *Deleted

## 2022-03-08 ENCOUNTER — Encounter: Payer: Self-pay | Admitting: Hematology and Oncology

## 2022-03-08 DIAGNOSIS — Z171 Estrogen receptor negative status [ER-]: Secondary | ICD-10-CM

## 2022-03-08 DIAGNOSIS — C50411 Malignant neoplasm of upper-outer quadrant of right female breast: Secondary | ICD-10-CM

## 2022-03-09 ENCOUNTER — Other Ambulatory Visit: Payer: 59

## 2022-03-09 ENCOUNTER — Inpatient Hospital Stay: Payer: 59

## 2022-03-09 ENCOUNTER — Other Ambulatory Visit: Payer: Self-pay

## 2022-03-09 ENCOUNTER — Inpatient Hospital Stay (HOSPITAL_BASED_OUTPATIENT_CLINIC_OR_DEPARTMENT_OTHER): Payer: 59 | Admitting: Adult Health

## 2022-03-09 VITALS — BP 108/47 | HR 63 | Temp 97.7°F | Resp 18 | Ht 67.0 in | Wt 163.3 lb

## 2022-03-09 DIAGNOSIS — C50411 Malignant neoplasm of upper-outer quadrant of right female breast: Secondary | ICD-10-CM

## 2022-03-09 DIAGNOSIS — Z171 Estrogen receptor negative status [ER-]: Secondary | ICD-10-CM | POA: Diagnosis not present

## 2022-03-09 DIAGNOSIS — Z5112 Encounter for antineoplastic immunotherapy: Secondary | ICD-10-CM | POA: Diagnosis not present

## 2022-03-09 DIAGNOSIS — Z95828 Presence of other vascular implants and grafts: Secondary | ICD-10-CM

## 2022-03-09 LAB — CBC WITH DIFFERENTIAL (CANCER CENTER ONLY)
Abs Immature Granulocytes: 0.83 10*3/uL — ABNORMAL HIGH (ref 0.00–0.07)
Basophils Absolute: 0.1 10*3/uL (ref 0.0–0.1)
Basophils Relative: 1 %
Eosinophils Absolute: 0 10*3/uL (ref 0.0–0.5)
Eosinophils Relative: 0 %
HCT: 27.5 % — ABNORMAL LOW (ref 36.0–46.0)
Hemoglobin: 8.9 g/dL — ABNORMAL LOW (ref 12.0–15.0)
Immature Granulocytes: 11 %
Lymphocytes Relative: 17 %
Lymphs Abs: 1.3 10*3/uL (ref 0.7–4.0)
MCH: 33.5 pg (ref 26.0–34.0)
MCHC: 32.4 g/dL (ref 30.0–36.0)
MCV: 103.4 fL — ABNORMAL HIGH (ref 80.0–100.0)
Monocytes Absolute: 0.9 10*3/uL (ref 0.1–1.0)
Monocytes Relative: 12 %
Neutro Abs: 4.7 10*3/uL (ref 1.7–7.7)
Neutrophils Relative %: 59 %
Platelet Count: 160 10*3/uL (ref 150–400)
RBC: 2.66 MIL/uL — ABNORMAL LOW (ref 3.87–5.11)
RDW: 17.8 % — ABNORMAL HIGH (ref 11.5–15.5)
Smear Review: NORMAL
WBC Count: 7.8 10*3/uL (ref 4.0–10.5)
nRBC: 0.5 % — ABNORMAL HIGH (ref 0.0–0.2)

## 2022-03-09 LAB — CMP (CANCER CENTER ONLY)
ALT: 8 U/L (ref 0–44)
AST: 9 U/L — ABNORMAL LOW (ref 15–41)
Albumin: 4 g/dL (ref 3.5–5.0)
Alkaline Phosphatase: 69 U/L (ref 38–126)
Anion gap: 6 (ref 5–15)
BUN: 12 mg/dL (ref 6–20)
CO2: 28 mmol/L (ref 22–32)
Calcium: 9.4 mg/dL (ref 8.9–10.3)
Chloride: 106 mmol/L (ref 98–111)
Creatinine: 0.67 mg/dL (ref 0.44–1.00)
GFR, Estimated: >60 mL/min (ref >60)
Glucose, Bld: 104 mg/dL — ABNORMAL HIGH (ref 70–99)
Potassium: 3.8 mmol/L (ref 3.5–5.1)
Sodium: 140 mmol/L (ref 135–145)
Total Bilirubin: 0.1 mg/dL — ABNORMAL LOW (ref 0.3–1.2)
Total Protein: 6.1 g/dL — ABNORMAL LOW (ref 6.5–8.1)

## 2022-03-09 MED ORDER — SODIUM CHLORIDE 0.9% FLUSH
10.0000 mL | Freq: Once | INTRAVENOUS | Status: AC
  Administered 2022-03-09: 10 mL

## 2022-03-09 MED ORDER — DOXYCYCLINE HYCLATE 100 MG PO TABS: 100.0000 mg | ORAL_TABLET | Freq: Every day | ORAL | 1 refills | Status: DC

## 2022-03-10 ENCOUNTER — Other Ambulatory Visit: Payer: Self-pay | Admitting: Hematology and Oncology

## 2022-03-10 ENCOUNTER — Ambulatory Visit: Payer: Managed Care, Other (non HMO)

## 2022-03-10 ENCOUNTER — Encounter: Payer: Self-pay | Admitting: Adult Health

## 2022-03-10 LAB — TSH: TSH: 25.524 u[IU]/mL — ABNORMAL HIGH (ref 0.308–3.960)

## 2022-03-10 LAB — T4: T4, Total: 5.2 ug/dL (ref 4.5–12.0)

## 2022-03-11 ENCOUNTER — Encounter: Payer: Self-pay | Admitting: Hematology and Oncology

## 2022-03-11 NOTE — Assessment & Plan Note (Signed)
11/04/2021:?Patient has been breast-feeding. ?Felt a painful right breast mass and an axillary mass. ?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 with a Ki-67 of 40%, 1 abnormal lymph node 5.8 cm biopsy: Grade 3 IDC ER 5%, PR 0%, HER2 negative, Ki-67 40% ?? ?CT CAP 11/20/2021: Right breast cancer with axillary lymph nodes, mildly enlarged right retropectoral lymph nodes, right internal mammary lymph nodes ?Breast MRI 11/17/2021: Known breast cancer 2 cm, right axillary lymph node, non-mass enhancement 7 cm, borderline enlarged right internal mammary lymph nodes ?? ?Treatment Plan:?based on multidisciplinary tumor board: ?1. Neoadjuvant chemotherapy with Taxol weekly ?12 with?Keytruda and?carboplatin every 3 weeks x4 Adriamycin and Cytoxan, Keytruda?dose dense ?4 followed by ?followed by Beryle Flock maintenance for 1 year ?2. Followed by?mastectomy?with targeted axillary dissection ?3. Followed by adjuvant radiation therapy ??--------------------------------------------------------------------------------------------------------------------------- ?? ?Current Treatment: Completed 9 cycles of weekly?Taxol (carboplatin and Keytruda every 3 weeks), today s/p cycle 3 of Adriamycin and Cytoxan with Keytruda ?Labs reviewed ?? ?Chemo Toxicities: ?1. Facial Rash: I recommended she continue to apply Clindagel.  I sent in daily doxycycline which can help with the acneform rash she is experiencing.   ? ?We will see Sherrin back on 03/16/2022 for labs, f/u, and Adriamycin/Cytoxan/Keytruda.   ?

## 2022-03-11 NOTE — Progress Notes (Signed)
Sydney Rios: ?  ? ?Sydney Rasmussen, MD ?[address] ?[place] [address] ? ? ?DIAGNOSIS:  Cancer Staging  ?Malignant neoplasm of upper-outer quadrant of right breast in female, estrogen receptor negative ([place]) ?Staging form: Breast, AJCC 8th Edition ?- Clinical stage from 11/11/2021: Stage IIIA (cT3, cN1, cM0, G3, ER+, PR+, HER2-) - Signed by Nicholas Lose, MD on 02/18/2022 ?Stage prefix: Initial diagnosis ?Histologic grading system: 3 grade system ? ? ?SUMMARY OF ONCOLOGIC HISTORY: ?Oncology History  ?Malignant neoplasm of upper-outer quadrant of right breast in female, estrogen receptor negative ([place])  ?11/04/2021 Initial Diagnosis  ?  Patient has been breast-feeding.  Felt a painful right breast mass and an axillary mass.  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 with a Ki-67 of 40%, 1 abnormal lymph node 5.8 cm biopsy: Grade 3 IDC ER 5%, PR 0%, HER2 negative, Ki-67 40% ?  ?11/11/2021 Cancer Staging  ? Staging form: Breast, AJCC 8th Edition ?- Clinical stage from 11/11/2021: Stage IIIA (cT3, cN1, cM0, G3, ER+, PR+, HER2-) - Signed by Nicholas Lose, MD on 02/18/2022 ?Stage prefix: Initial diagnosis ?Histologic grading system: 3 grade system ? ?  ?11/30/2021 -  Chemotherapy  ? Patient is on Treatment Plan : BREAST Pembrolizumab + Carboplatin D1 + Paclitaxel D1,8,15 q21d X 4 cycles / Pembrolizumab + AC q21d x 4 cycles  ?   ?11/30/2021 Genetic Testing  ? Negative genetic testing on the CancerNext-Expanded+RNA insight panel.  The report date is 11/30/2021 ? ?The CancerNext-Expanded gene panel offered by [facility] and includes sequencing and rearrangement analysis for the following 77 genes: AIP, ALK, APC*, ATM*, AXIN2, BAP1, BARD1, BLM, BMPR1A, BRCA1*, BRCA2*, BRIP1*, CDC73, CDH1*, CDK4, CDKN1B, CDKN2A, CHEK2*, CTNNA1, DICER1, FANCC, FH, FLCN, GALNT12, KIF1B, LZTR1, MAX, MEN1, MET, MLH1*, MSH2*, MSH3, MSH6*, MUTYH*, NBN, NF1*,  NF2, NTHL1, PALB2*, PHOX2B, PMS2*, POT1, PRKAR1A, PTCH1, PTEN*, RAD51C*, RAD51D*, RB1, RECQL, RET, SDHA, SDHAF2, SDHB, SDHC, SDHD, SMAD4, SMARCA4, SMARCB1, SMARCE1, STK11, SUFU, TMEM127, TP53*, TSC1, TSC2, VHL and XRCC2 (sequencing and deletion/duplication); EGFR, EGLN1, HOXB13, KIT, MITF, PDGFRA, POLD1, and POLE (sequencing only); EPCAM and GREM1 (deletion/duplication only). DNA and RNA analyses performed for * genes.  ?  ? ? ?CURRENT THERAPY: Adriamycin and Cytoxan ? ?INTERVAL HISTORY: ?Sydney Rios 41 y.o. female returns for evaluation of acneform rash on her face that has been present and worsening over the past several days.  She has applied Clindagel which has helped some, but she would like to know what else she can do to help with the rash.   ? ?She tells me that she is feeling slightly better with her Levothyroxine, and is relieved her TSH had decreased some.   ? ?Patient Active Problem List  ? Diagnosis Date Noted  ? Genetic testing 12/01/2021  ? Port-A-Cath in place 11/30/2021  ? Malignant neoplasm of upper-outer quadrant of right breast in female, estrogen receptor negative ([place]) 11/11/2021  ? Abnormal maternal glucose tolerance, antepartum 09/15/2018  ? S/P cesarean section 10/11/2014  ? TACHYCARDIA 05/01/2010  ? ? ?has No Known Allergies. ? ?MEDICAL HISTORY: ?Past Medical History:  ?Diagnosis Date  ? Anxiety   ? Chronic kidney disease   ? hx kidney stones  ? Complication of anesthesia   ? runs heart rate in the 30-40s in PACU  ? History of kidney stones   ? Hx gestational diabetes   ? Hx of Hashimoto thyroiditis   ? PONV (  postoperative nausea and vomiting)   ? ? ?SURGICAL HISTORY: ?Past Surgical History:  ?Procedure Laterality Date  ? CESAREAN SECTION    ? CESAREAN SECTION  01/23/2013  ? Procedure: CESAREAN SECTION;  Surgeon: Luz Lex, MD;  Location: [facility];  Service: Obstetrics;  Laterality: N/A;  ? CESAREAN SECTION N/A 10/11/2014  ? Procedure: CESAREAN SECTION;  Surgeon: Cyril Mourning, MD;   Location: [facility];  Service: Obstetrics;  Laterality: N/A;  ? CESAREAN SECTION N/A 10/24/2018  ? Procedure: REPEAT CESAREAN SECTION;  Surgeon: Dian Queen, MD;  Location: [facility];  Service: Obstetrics;  Laterality: N/A;  Tracey RNFA  ? LITHOTRIPSY    ? PORTACATH PLACEMENT Left 11/25/2021  ? Procedure: PORT PLACEMENT;  Surgeon: Rolm Bookbinder, MD;  Location: [facility];  Service: General;  Laterality: Left;  ? WISDOM TOOTH EXTRACTION    ? ? ?SOCIAL HISTORY: ?Social History  ? ?Socioeconomic History  ? Marital status: Married  ?  Spouse name: Not on file  ? Number of children: Not on file  ? Years of education: Not on file  ? Highest education level: Not on file  ?Occupational History  ? Not on file  ?Tobacco Use  ? Smoking status: Never  ? Smokeless tobacco: Never  ?Vaping Use  ? Vaping Use: Never used  ?Substance and Sexual Activity  ? Alcohol use: No  ? Drug use: No  ? Sexual activity: Yes  ?  Birth control/protection: Other-see comments  ?  Comment: Husband has had a vasectomy  ?Other Topics Concern  ? Not on file  ?Social History Narrative  ? Not on file  ? ?Social Determinants of Health  ? ?Financial Resource Strain: Not on file  ?Food Insecurity: Not on file  ?Transportation Needs: Not on file  ?Physical Activity: Not on file  ?Stress: Not on file  ?Social Connections: Not on file  ?Intimate Partner Violence: Not on file  ? ? ?FAMILY HISTORY: ?Family History  ?Problem Relation Age of Onset  ? Hypertension Mother   ? Diabetes Mother   ? Hypertension Father   ? Hypertension Maternal Grandmother   ? Diabetes Maternal Grandmother   ? Heart disease Maternal Grandfather   ? Hypertension Maternal Grandfather   ? Heart disease Paternal Grandfather   ? Hypertension Paternal Grandfather   ? Retinoblastoma Cousin   ?     dx 3 mo - left eye  ? ? ?Review of Systems  ?Constitutional:  Positive for fatigue. Negative for appetite change, chills, fever and unexpected weight change.  ?HENT:   Negative for hearing  loss, lump/mass and trouble swallowing.   ?Eyes:  Negative for eye problems and icterus.  ?Respiratory:  Negative for chest tightness, cough and shortness of breath.   ?Cardiovascular:  Negative for chest pain, leg swelling and palpitations.  ?Gastrointestinal:  Negative for abdominal distention, abdominal pain, constipation, diarrhea, nausea and vomiting.  ?Endocrine: Negative for hot flashes.  ?Genitourinary:  Negative for difficulty urinating.   ?Musculoskeletal:  Negative for arthralgias.  ?Skin:  Negative for itching and rash.  ?Neurological:  Negative for dizziness, extremity weakness, headaches and numbness.  ?Hematological:  Negative for adenopathy. Does not bruise/bleed easily.  ?Psychiatric/Behavioral:  Negative for depression. The patient is not nervous/anxious.    ? ? ?PHYSICAL EXAMINATION ? ?ECOG PERFORMANCE STATUS: 1 - Symptomatic but completely ambulatory ? ?Vitals:  ? 03/09/22 1603  ?BP: (!) 108/47  ?Pulse: 63  ?Resp: 18  ?Temp: 97.7 ?F (36.5 ?C)  ?SpO2: 100%  ? ? ?  Physical Exam ?Constitutional:   ?   General: She is not in acute distress. ?   Appearance: Normal appearance. She is not toxic-appearing.  ?HENT:  ?   Head: Normocephalic and atraumatic.  ?Eyes:  ?   General: No scleral icterus. ?Cardiovascular:  ?   Rate and Rhythm: Normal rate and regular rhythm.  ?   Pulses: Normal pulses.  ?   Heart sounds: Normal heart sounds.  ?Pulmonary:  ?   Effort: Pulmonary effort is normal.  ?   Breath sounds: Normal breath sounds.  ?Abdominal:  ?   General: Abdomen is flat. Bowel sounds are normal. There is no distension.  ?   Palpations: Abdomen is soft.  ?   Tenderness: There is no abdominal tenderness.  ?Musculoskeletal:     ?   General: No swelling.  ?   Cervical back: Neck supple.  ?Lymphadenopathy:  ?   Cervical: No cervical adenopathy.  ?Skin: ?   General: Skin is warm and dry.  ?   Findings: No rash.  ?Neurological:  ?   General: No focal deficit present.  ?   Mental Status: She is alert.   ?Psychiatric:     ?   Mood and Affect: Mood normal.     ?   Behavior: Behavior normal.  ? ? ?LABORATORY DATA: ? ?CBC ?   ?Component Value Date/Time  ? WBC 7.8 03/09/2022 1508  ? WBC 9.6 10/25/2018 0515  ? RBC 2.

## 2022-03-16 ENCOUNTER — Inpatient Hospital Stay: Payer: 59

## 2022-03-16 ENCOUNTER — Encounter: Payer: Self-pay | Admitting: Licensed Clinical Social Worker

## 2022-03-16 ENCOUNTER — Other Ambulatory Visit: Payer: Self-pay

## 2022-03-16 ENCOUNTER — Inpatient Hospital Stay (HOSPITAL_BASED_OUTPATIENT_CLINIC_OR_DEPARTMENT_OTHER): Payer: 59 | Admitting: Hematology and Oncology

## 2022-03-16 DIAGNOSIS — Z5112 Encounter for antineoplastic immunotherapy: Secondary | ICD-10-CM | POA: Diagnosis not present

## 2022-03-16 DIAGNOSIS — Z171 Estrogen receptor negative status [ER-]: Secondary | ICD-10-CM | POA: Diagnosis not present

## 2022-03-16 DIAGNOSIS — C50411 Malignant neoplasm of upper-outer quadrant of right female breast: Secondary | ICD-10-CM

## 2022-03-16 DIAGNOSIS — Z95828 Presence of other vascular implants and grafts: Secondary | ICD-10-CM

## 2022-03-16 LAB — CMP (CANCER CENTER ONLY)
ALT: 9 U/L (ref 0–44)
AST: 11 U/L — ABNORMAL LOW (ref 15–41)
Albumin: 3.9 g/dL (ref 3.5–5.0)
Alkaline Phosphatase: 47 U/L (ref 38–126)
Anion gap: 5 (ref 5–15)
BUN: 11 mg/dL (ref 6–20)
CO2: 27 mmol/L (ref 22–32)
Calcium: 8.9 mg/dL (ref 8.9–10.3)
Chloride: 108 mmol/L (ref 98–111)
Creatinine: 0.65 mg/dL (ref 0.44–1.00)
GFR, Estimated: >60 mL/min (ref >60)
Glucose, Bld: 126 mg/dL — ABNORMAL HIGH (ref 70–99)
Potassium: 3.7 mmol/L (ref 3.5–5.1)
Sodium: 140 mmol/L (ref 135–145)
Total Bilirubin: 0.3 mg/dL (ref 0.3–1.2)
Total Protein: 6.2 g/dL — ABNORMAL LOW (ref 6.5–8.1)

## 2022-03-16 LAB — CBC WITH DIFFERENTIAL (CANCER CENTER ONLY)
Abs Immature Granulocytes: 0.01 10*3/uL (ref 0.00–0.07)
Basophils Absolute: 0.1 10*3/uL (ref 0.0–0.1)
Basophils Relative: 1 %
Eosinophils Absolute: 0 10*3/uL (ref 0.0–0.5)
Eosinophils Relative: 1 %
HCT: 28.5 % — ABNORMAL LOW (ref 36.0–46.0)
Hemoglobin: 9.3 g/dL — ABNORMAL LOW (ref 12.0–15.0)
Immature Granulocytes: 0 %
Lymphocytes Relative: 22 %
Lymphs Abs: 0.8 10*3/uL (ref 0.7–4.0)
MCH: 33.5 pg (ref 26.0–34.0)
MCHC: 32.6 g/dL (ref 30.0–36.0)
MCV: 102.5 fL — ABNORMAL HIGH (ref 80.0–100.0)
Monocytes Absolute: 0.4 10*3/uL (ref 0.1–1.0)
Monocytes Relative: 10 %
Neutro Abs: 2.4 10*3/uL (ref 1.7–7.7)
Neutrophils Relative %: 66 %
Platelet Count: 285 10*3/uL (ref 150–400)
RBC: 2.78 MIL/uL — ABNORMAL LOW (ref 3.87–5.11)
RDW: 16.9 % — ABNORMAL HIGH (ref 11.5–15.5)
WBC Count: 3.5 10*3/uL — ABNORMAL LOW (ref 4.0–10.5)
nRBC: 0 % (ref 0.0–0.2)

## 2022-03-16 LAB — TSH: TSH: 26.123 u[IU]/mL — ABNORMAL HIGH (ref 0.308–3.960)

## 2022-03-16 MED ORDER — SODIUM CHLORIDE 0.9 % IV SOLN
200.0000 mg | Freq: Once | INTRAVENOUS | Status: AC
  Administered 2022-03-16: 200 mg via INTRAVENOUS
  Filled 2022-03-16: qty 200

## 2022-03-16 MED ORDER — SODIUM CHLORIDE 0.9 % IV SOLN: Freq: Once | INTRAVENOUS | Status: AC

## 2022-03-16 MED ORDER — DOXORUBICIN HCL CHEMO IV INJECTION 2 MG/ML
50.0000 mg/m2 | Freq: Once | INTRAVENOUS | Status: AC
  Administered 2022-03-16: 90 mg via INTRAVENOUS
  Filled 2022-03-16: qty 45

## 2022-03-16 MED ORDER — SODIUM CHLORIDE 0.9% FLUSH
10.0000 mL | Freq: Once | INTRAVENOUS | Status: AC
  Administered 2022-03-16: 10 mL

## 2022-03-16 MED ORDER — PALONOSETRON HCL INJECTION 0.25 MG/5ML
0.2500 mg | Freq: Once | INTRAVENOUS | Status: AC
  Administered 2022-03-16: 0.25 mg via INTRAVENOUS
  Filled 2022-03-16: qty 5

## 2022-03-16 MED ORDER — SODIUM CHLORIDE 0.9 % IV SOLN
150.0000 mg | Freq: Once | INTRAVENOUS | Status: AC
  Administered 2022-03-16: 150 mg via INTRAVENOUS
  Filled 2022-03-16: qty 150

## 2022-03-16 MED ORDER — SODIUM CHLORIDE 0.9 % IV SOLN
10.0000 mg | Freq: Once | INTRAVENOUS | Status: AC
  Administered 2022-03-16: 10 mg via INTRAVENOUS
  Filled 2022-03-16: qty 10

## 2022-03-16 MED ORDER — SODIUM CHLORIDE 0.9 % IV SOLN
500.0000 mg/m2 | Freq: Once | INTRAVENOUS | Status: AC
  Administered 2022-03-16: 900 mg via INTRAVENOUS
  Filled 2022-03-16: qty 45

## 2022-03-16 NOTE — Progress Notes (Signed)
Pt provided final documents for [employer] application. CSW submitted application and notified pt of communication process from foundation. ? ? ?Jayle Solarz E Marciana Uplinger, LCSW ?

## 2022-03-16 NOTE — Progress Notes (Signed)
[facility] CSW Progress Note ? ?Clinical Social Worker met with patient in infusion. Provided second [employer]. Pt will send information for bank statement to then submit [employer] application. ? ?Pt reports feeling depressed lately. She declined medication assistance from MD and is not currently interested in counseling or support groups. She is just trying to look forward to the end of treatment. Pt is also busy with her two oldest children's sports which take up every week night as well as handling all children's needs daily. Discussed possibly finding another family to share carpooling to practices to help ease the burden. ? ?CSW gave information on upcoming [facility] [employer]. ? ? ? ? ?Christeen Douglas , LCSW ?

## 2022-03-16 NOTE — Progress Notes (Signed)
? ?Patient Care Team: ?[name], MD as PCP - General (Family Medicine) ? ?DIAGNOSIS:  ?Encounter Diagnosis  ?Name Primary?  ? Malignant neoplasm of upper-outer quadrant of right breast in female, estrogen receptor negative ([place])   ? ? ?SUMMARY OF ONCOLOGIC HISTORY: ?Oncology History  ?Malignant neoplasm of upper-outer quadrant of right breast in female, estrogen receptor negative ([place])  ?11/04/2021 Initial Diagnosis  ?  Patient has been breast-feeding.  Felt a painful right breast mass and an axillary mass.  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 with a Ki-67 of 40%, 1 abnormal lymph node 5.8 cm biopsy: Grade 3 IDC ER 5%, PR 0%, HER2 negative, Ki-67 40% ?  ?11/11/2021 Cancer Staging  ? Staging form: Breast, AJCC 8th Edition ?- Clinical stage from 11/11/2021: Stage IIIA (cT3, cN1, cM0, G3, ER+, PR+, HER2-) - Signed by Nicholas Lose, MD on 02/18/2022 ?Stage prefix: Initial diagnosis ?Histologic grading system: 3 grade system ? ?  ?11/30/2021 -  Chemotherapy  ? Patient is on Treatment Plan : BREAST Pembrolizumab + Carboplatin D1 + Paclitaxel D1,8,15 q21d X 4 cycles / Pembrolizumab + AC q21d x 4 cycles  ?   ?11/30/2021 Genetic Testing  ? Negative genetic testing on the CancerNext-Expanded+RNA insight panel.  The report date is 11/30/2021 ? ?The CancerNext-Expanded gene panel offered by [facility] and includes sequencing and rearrangement analysis for the following 77 genes: AIP, ALK, APC*, ATM*, AXIN2, BAP1, BARD1, BLM, BMPR1A, BRCA1*, BRCA2*, BRIP1*, CDC73, CDH1*, CDK4, CDKN1B, CDKN2A, CHEK2*, CTNNA1, DICER1, FANCC, FH, FLCN, GALNT12, KIF1B, LZTR1, MAX, MEN1, MET, MLH1*, MSH2*, MSH3, MSH6*, MUTYH*, NBN, NF1*, NF2, NTHL1, PALB2*, PHOX2B, PMS2*, POT1, PRKAR1A, PTCH1, PTEN*, RAD51C*, RAD51D*, RB1, RECQL, RET, SDHA, SDHAF2, SDHB, SDHC, SDHD, SMAD4, SMARCA4, SMARCB1, SMARCE1, STK11, SUFU, TMEM127, TP53*, TSC1, TSC2, VHL and XRCC2 (sequencing and deletion/duplication);  EGFR, EGLN1, HOXB13, KIT, MITF, PDGFRA, POLD1, and POLE (sequencing only); EPCAM and GREM1 (deletion/duplication only). DNA and RNA analyses performed for * genes.  ?  ? ? ?CHIEF COMPLIANT: Cycle 2 Keytruda + AC ? ?INTERVAL HISTORY: Sydney Rios is a  41 y.o. with above-mentioned history of breast cancer, currently on neoadjuvant chemotherapy with Taxol. She presents to the clinic today for toxicity check. She complained of nausea. She states the fatigue is better. She also complains of hair falling out. She also states she feels depressed. ? ? ?ALLERGIES:  has No Known Allergies. ? ?MEDICATIONS:  ?Current Outpatient Medications  ?Medication Sig Dispense Refill  ? Ascorbic Acid (VITAMIN C) 1000 MG tablet Take 1,000 mg by mouth 2 (two) times daily as needed (immune support (if sick)).    ? Cholecalciferol (VITAMIN D3) 125 MCG (5000 UT) TABS Take 5,000 Units by mouth daily as needed (immune support (if sick)).    ? clindamycin (CLINDAGEL) 1 % gel Apply topically 2 (two) times daily. 30 g 0  ? doxycycline (VIBRA-TABS) 100 MG tablet Take 1 tablet (100 mg total) by mouth daily. 30 tablet 1  ? furosemide (LASIX) 20 MG tablet TAKE 1/2-1 TAB EVERY OTHER DAY AS NEEDED 10 tablet 0  ? IVERMECTIN PO Take 20 mg by mouth in the morning.    ? levothyroxine (SYNTHROID) 100 MCG tablet Take 1 tablet (100 mcg total) by mouth daily before breakfast. 30 tablet 0  ? lidocaine-prilocaine (EMLA) cream Apply to affected area once 30 g 3  ? Menaquinone-7 (VITAMIN K2) 100 MCG CAPS Take 100 mcg by mouth daily as needed (immune support (if sick)).    ?  NALTREXONE HCL PO Take 1 mg by mouth in the morning.    ? ondansetron (ZOFRAN) 8 MG tablet TAKE 1 TABLET (8 MG TOTAL) BY MOUTH 2 (TWO) TIMES DAILY AS NEEDED. START ON THE THIRD DAY AFTER CARBOPLATIN AND AC CHEMOTHERAPY. 30 tablet 1  ? prochlorperazine (COMPAZINE) 10 MG tablet Take 1 tablet (10 mg total) by mouth every 6 (six) hours as needed (Nausea or vomiting). 30 tablet 1  ? valACYclovir  (VALTREX) 1000 MG tablet Take 2 g by mouth 2 (two) times daily as needed (cold sore/fever blisters).    ? Zinc 50 MG TABS Take 50 mg by mouth daily as needed.    ? ?No current facility-administered medications for this visit.  ? ?Facility-Administered Medications Ordered in Other Visits  ?Medication Dose Route Frequency Provider Last Rate Last Admin  ? cyclophosphamide (CYTOXAN) 900 mg in sodium chloride 0.9 % 250 mL chemo infusion  500 mg/m2 (Treatment Plan Recorded) Intravenous Once Nicholas Lose, MD      ? DOXOrubicin (ADRIAMYCIN) chemo injection 90 mg  50 mg/m2 (Treatment Plan Recorded) Intravenous Once Nicholas Lose, MD      ? pembrolizumab (KEYTRUDA) 200 mg in sodium chloride 0.9 % 50 mL chemo infusion  200 mg Intravenous Once Nicholas Lose, MD 116 mL/hr at 03/16/22 1239 200 mg at 03/16/22 1239  ? ? ?PHYSICAL EXAMINATION: ?ECOG PERFORMANCE STATUS: 1 - Symptomatic but completely ambulatory ? ?Vitals:  ? 03/16/22 1119  ?BP: (!) 94/57  ?Pulse: 62  ?Resp: 18  ?Temp: (!) 97.5 ?F (36.4 ?C)  ?SpO2: 100%  ? ?Filed Weights  ? 03/16/22 1119  ?Weight: 166 lb (75.3 kg)  ?  ? ?LABORATORY DATA:  ?I have reviewed the data as listed ?CMP Latest Ref Rng & Units 03/16/2022 03/09/2022 03/02/2022  ?Glucose 70 - 99 mg/dL 126(H) 104(H) 91  ?BUN 6 - 20 mg/dL $Remove'11 12 14  'YbgtgTd$ ?Creatinine 0.44 - 1.00 mg/dL 0.65 0.67 0.58  ?Sodium 135 - 145 mmol/L 140 140 139  ?Potassium 3.5 - 5.1 mmol/L 3.7 3.8 4.2  ?Chloride 98 - 111 mmol/L 108 106 108  ?CO2 22 - 32 mmol/L $RemoveB'27 28 28  'lGIMmRDG$ ?Calcium 8.9 - 10.3 mg/dL 8.9 9.4 9.5  ?Total Protein 6.5 - 8.1 g/dL 6.2(L) 6.1(L) 6.4(L)  ?Total Bilirubin 0.3 - 1.2 mg/dL 0.3 0.1(L) 0.3  ?Alkaline Phos 38 - 126 U/L 47 69 59  ?AST 15 - 41 U/L 11(L) 9(L) 9(L)  ?ALT 0 - 44 U/L $Remo'9 8 7  'JzPEC$ ? ? ?Lab Results  ?Component Value Date  ? WBC 3.5 (L) 03/16/2022  ? HGB 9.3 (L) 03/16/2022  ? HCT 28.5 (L) 03/16/2022  ? MCV 102.5 (H) 03/16/2022  ? PLT 285 03/16/2022  ? NEUTROABS 2.4 03/16/2022  ? ? ?ASSESSMENT & PLAN:  ?Malignant neoplasm of  upper-outer quadrant of right breast in female, estrogen receptor negative ([place]) ?11/04/2021: Patient has been breast-feeding.  Felt a painful right breast mass and an axillary mass.  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 with a Ki-67 of 40%, 1 abnormal lymph node 5.8 cm biopsy: Grade 3 IDC ER 5%, PR 0%, HER2 negative, Ki-67 40% ?  ?CT CAP 11/20/2021: Right breast cancer with axillary lymph nodes, mildly enlarged right retropectoral lymph nodes, right internal mammary lymph nodes ?Breast MRI 11/17/2021: Known breast cancer 2 cm, right axillary lymph node, non-mass enhancement 7 cm, borderline enlarged right internal mammary lymph nodes ?  ?Treatment Plan: based on multidisciplinary tumor board: ?1.  Neoadjuvant chemotherapy with Taxol weekly ?12 with Keytruda and carboplatin every 3 weeks x4 Adriamycin and Cytoxan, Keytruda dose dense ?4 followed by  followed by Beryle Flock maintenance for 1 year ?2. Followed by mastectomy with targeted axillary dissection ?3. Followed by adjuvant radiation therapy ? --------------------------------------------------------------------------------------------------------------------------- ?  ?Current Treatment: Completed 9 cycles of weekly Taxol (carboplatin and Keytruda every 3 weeks), today is cycle 2 of Adriamycin and Cytoxan with Keytruda ?Labs reviewed ?  ?Chemo Toxicities: ?Facial Rash: I recommended she continue to apply Clindagel.  I sent in daily doxycycline which can help with the acneform rash she is experiencing.   ?Fatigue ?Depression ?Chemotherapy-induced anemia today's hemoglobin is 9.3 ? ?Return to clinic in 3 weeks for cycle 3 ? ? ? ?No orders of the defined types were placed in this encounter. ? ?The patient has a good understanding of the overall plan. she agrees with it. she will call with any problems that may develop before the next visit here. ?Total time spent: 30 mins including face to face time and time spent for  planning, charting and co-ordination of care ? ? Harriette Ohara, MD ?03/16/22 ? ? ? I Deritra, [place] am acting as a scribe for Dr. Lindi Adie ?  ? ? ? ?

## 2022-03-16 NOTE — Assessment & Plan Note (Signed)
11/04/2021:?Patient has been breast-feeding. ?Felt a painful right breast mass and an axillary mass. ?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 with a Ki-67 of 40%, 1 abnormal lymph node 5.8 cm biopsy: Grade 3 IDC ER 5%, PR 0%, HER2 negative, Ki-67 40% ?? ?CT CAP 11/20/2021: Right breast cancer with axillary lymph nodes, mildly enlarged right retropectoral lymph nodes, right internal mammary lymph nodes ?Breast MRI 11/17/2021: Known breast cancer 2 cm, right axillary lymph node, non-mass enhancement 7 cm, borderline enlarged right internal mammary lymph nodes ?? ?Treatment Plan:?based on multidisciplinary tumor board: ?1. Neoadjuvant chemotherapy with Taxol weekly ?12 with?Keytruda and?carboplatin every 3 weeks x4 Adriamycin and Cytoxan, Keytruda?dose dense ?4 followed by ?followed by Beryle Flock maintenance for 1 year ?2. Followed by?mastectomy?with targeted axillary dissection ?3. Followed by adjuvant radiation therapy ??--------------------------------------------------------------------------------------------------------------------------- ?? ?Current Treatment: Completed 9 cycles of?weekly?Taxol?(carboplatin and Keytruda?every 3 weeks), today is cycle 2 of Adriamycin and Cytoxan with Keytruda ?Labs reviewed ?? ?Chemo Toxicities: ?1. Facial Rash: I recommended she continue to apply Clindagel.  I sent in daily doxycycline which can help with the acneform rash she is experiencing.   ??  ?

## 2022-03-17 LAB — T4: T4, Total: 5.8 ug/dL (ref 4.5–12.0)

## 2022-03-18 ENCOUNTER — Other Ambulatory Visit: Payer: Self-pay

## 2022-03-18 ENCOUNTER — Inpatient Hospital Stay: Payer: 59

## 2022-03-18 VITALS — BP 102/57 | HR 64 | Temp 98.2°F | Resp 18

## 2022-03-18 DIAGNOSIS — C50411 Malignant neoplasm of upper-outer quadrant of right female breast: Secondary | ICD-10-CM

## 2022-03-18 DIAGNOSIS — Z5112 Encounter for antineoplastic immunotherapy: Secondary | ICD-10-CM | POA: Diagnosis not present

## 2022-03-18 MED ORDER — PEGFILGRASTIM-BMEZ 6 MG/0.6ML ~~LOC~~ SOSY
6.0000 mg | PREFILLED_SYRINGE | Freq: Once | SUBCUTANEOUS | Status: AC
  Administered 2022-03-18: 6 mg via SUBCUTANEOUS
  Filled 2022-03-18: qty 0.6

## 2022-03-19 ENCOUNTER — Telehealth: Payer: Self-pay | Admitting: Hematology and Oncology

## 2022-03-19 ENCOUNTER — Other Ambulatory Visit: Payer: Self-pay | Admitting: *Deleted

## 2022-03-19 ENCOUNTER — Encounter: Payer: Self-pay | Admitting: Hematology and Oncology

## 2022-03-19 MED ORDER — LEVOTHYROXINE SODIUM 125 MCG PO TABS: 125.0000 ug | ORAL_TABLET | Freq: Every day | ORAL | 1 refills | Status: DC

## 2022-03-19 NOTE — Telephone Encounter (Signed)
.  Called patient to schedule appointment per 3/24 hp inbasket, patient is aware of date and time.   ?

## 2022-03-19 NOTE — Progress Notes (Signed)
Reviewed recent TSH with MD.  Verbal orders received by MD for pt to increase levothyroxine to 125 mcg p.o daily. Prescription sent to pharmacy on file.  Pt notified, educated, and verbalized understanding.  ?

## 2022-03-20 ENCOUNTER — Other Ambulatory Visit: Payer: Self-pay | Admitting: Hematology and Oncology

## 2022-03-20 ENCOUNTER — Inpatient Hospital Stay: Payer: 59

## 2022-03-20 ENCOUNTER — Other Ambulatory Visit: Payer: Self-pay

## 2022-03-20 VITALS — BP 102/58 | HR 86 | Temp 97.9°F | Resp 16

## 2022-03-20 DIAGNOSIS — Z5112 Encounter for antineoplastic immunotherapy: Secondary | ICD-10-CM | POA: Diagnosis not present

## 2022-03-20 DIAGNOSIS — E860 Dehydration: Secondary | ICD-10-CM

## 2022-03-20 DIAGNOSIS — Z95828 Presence of other vascular implants and grafts: Secondary | ICD-10-CM

## 2022-03-20 MED ORDER — NYSTATIN 100000 UNIT/ML MT SUSP: 5.0000 mL | Freq: Four times a day (QID) | OROMUCOSAL | 0 refills | Status: DC

## 2022-03-20 MED ORDER — HEPARIN SOD (PORK) LOCK FLUSH 100 UNIT/ML IV SOLN
500.0000 [IU] | Freq: Once | INTRAVENOUS | Status: AC
  Administered 2022-03-20: 500 [IU]

## 2022-03-20 MED ORDER — SODIUM CHLORIDE 0.9% FLUSH
10.0000 mL | Freq: Once | INTRAVENOUS | Status: AC
  Administered 2022-03-20: 10 mL

## 2022-03-20 MED ORDER — SODIUM CHLORIDE 0.9 % IV SOLN: Freq: Once | INTRAVENOUS | Status: AC

## 2022-03-20 NOTE — Patient Instructions (Signed)

## 2022-03-22 ENCOUNTER — Other Ambulatory Visit: Payer: Self-pay | Admitting: Hematology and Oncology

## 2022-03-23 ENCOUNTER — Other Ambulatory Visit: Payer: Self-pay | Admitting: *Deleted

## 2022-03-23 ENCOUNTER — Encounter: Payer: Self-pay | Admitting: *Deleted

## 2022-03-23 DIAGNOSIS — C50411 Malignant neoplasm of upper-outer quadrant of right female breast: Secondary | ICD-10-CM

## 2022-03-24 ENCOUNTER — Encounter: Payer: 59 | Admitting: Adult Health

## 2022-03-24 ENCOUNTER — Other Ambulatory Visit: Payer: 59

## 2022-03-25 ENCOUNTER — Encounter: Payer: Self-pay | Admitting: Plastic Surgery

## 2022-03-25 ENCOUNTER — Ambulatory Visit (INDEPENDENT_AMBULATORY_CARE_PROVIDER_SITE_OTHER): Payer: 59 | Admitting: Plastic Surgery

## 2022-03-25 ENCOUNTER — Other Ambulatory Visit: Payer: Self-pay

## 2022-03-25 VITALS — BP 104/49 | HR 87 | Ht 67.0 in | Wt 166.0 lb

## 2022-03-25 DIAGNOSIS — C50411 Malignant neoplasm of upper-outer quadrant of right female breast: Secondary | ICD-10-CM

## 2022-03-25 DIAGNOSIS — Z171 Estrogen receptor negative status [ER-]: Secondary | ICD-10-CM

## 2022-03-25 NOTE — Progress Notes (Signed)
? ?Referring Provider ?[name], MD ?[facility] ?[address] ?[place],  [place] [address]  ? ?CC:  ?Chief Complaint  ?Patient presents with  ? Advice Only  ?   ? ?Sydney Rios is an 41 y.o. female.  ?HPI: Patient presents to discuss breast reconstruction.  She was initially diagnosed in November 2022.  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  There was evidence of lymph node metastasis so she has been undergoing neoadjuvant chemotherapy.  She is done quite a bit of research and seems most interested in bilateral nipple sparing mastectomies.  She has consulted with a couple other plastic surgeons.  Recently she saw a surgeon down in [place] who recommended against free tissue transfer from her abdomen as she does not have enough tissue to give her the volume that she would need to accomplish her goals.  She still has a little bit of a ways to go in terms of her neoadjuvant treatment but wants to get a good plan together for surgery in the meantime.  She does not smoke and is not a diabetic.  No previous breast biopsies or procedures other than the current diagnosis.  She is currently a C cup and wants to be about the same size.  She is expecting to have postmastectomy radiation on the right. ? 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  She is interested at some point in combining hernia repair with abdominoplasty. ? ?No Known Allergies ? ?Outpatient Encounter Medications as of 03/25/2022  ?Medication Sig Note  ? Ascorbic Acid (VITAMIN C) 1000 MG tablet Take 1,000 mg by mouth 2 (two) times daily as needed (immune support (if sick)).   ? Cholecalciferol (VITAMIN D3) 125 MCG (5000 UT) TABS Take 5,000 Units by mouth daily as needed (immune support (if sick)).   ? clindamycin (CLINDAGEL) 1 % gel Apply topically 2 (two) times daily.   ?  doxycycline (VIBRA-TABS) 100 MG tablet Take 1 tablet (100 mg total) by mouth daily.   ? furosemide (LASIX) 20 MG tablet TAKE 1/2-1 TAB EVERY OTHER DAY AS NEEDED   ? IVERMECTIN PO Take 20 mg by mouth in the morning.   ? levothyroxine (SYNTHROID) 125 MCG tablet Take 1 tablet (125 mcg total) by mouth daily before breakfast.   ? lidocaine-prilocaine (EMLA) cream Apply to affected area once 11/23/2021: New medication patient has not started  ? Menaquinone-7 (VITAMIN K2) 100 MCG CAPS Take 100 mcg by mouth daily as needed (immune support (if sick)).   ? NALTREXONE HCL PO Take 1 mg by mouth in the morning.   ? nystatin (MYCOSTATIN) 100000 UNIT/ML suspension Take 5 mLs (500,000 Units total) by mouth 4 (four) times daily.   ? ondansetron (ZOFRAN) 8 MG tablet TAKE 1 TABLET (8 MG TOTAL) BY MOUTH 2 (TWO) TIMES DAILY AS NEEDED. START ON THE THIRD DAY AFTER CARBOPLATIN AND AC CHEMOTHERAPY.   ? prochlorperazine (COMPAZINE) 10 MG tablet Take 1 tablet (10 mg total) by mouth every 6 (six) hours as needed (Nausea or vomiting). 11/23/2021: New medication patient has not started ?  ? valACYclovir (VALTREX) 1000 MG tablet Take 2 g by mouth 2 (two) times daily as needed (cold sore/fever blisters).   ? Zinc 50 MG TABS Take 50 mg by mouth daily as needed.   ? ?No facility-administered encounter medications on file as of 03/25/2022.  ?  ? ?  Past Medical History:  ?Diagnosis Date  ? Anxiety   ? Chronic kidney disease   ? hx kidney stones  ? Complication of anesthesia   ? runs heart rate in the 30-40s in PACU  ? History of kidney stones   ? Hx gestational diabetes   ? Hx of Hashimoto thyroiditis   ? PONV (postoperative nausea and vomiting)   ? ? ?Past Surgical History:  ?Procedure Laterality Date  ? CESAREAN SECTION    ? CESAREAN SECTION  01/23/2013  ? Procedure: CESAREAN SECTION;  Surgeon: Luz Lex, MD;  Location: [facility];  Service: Obstetrics;  Laterality: N/A;  ? CESAREAN SECTION N/A 10/11/2014  ? Procedure: CESAREAN SECTION;  Surgeon:  Cyril Mourning, MD;  Location: [facility];  Service: Obstetrics;  Laterality: N/A;  ? CESAREAN SECTION N/A 10/24/2018  ? Procedure: REPEAT CESAREAN SECTION;  Surgeon: Dian Queen, MD;  Location: [facility];  Service: Obstetrics;  Laterality: N/A;  Tracey RNFA  ? LITHOTRIPSY    ? PORTACATH PLACEMENT Left 11/25/2021  ? Procedure: PORT PLACEMENT;  Surgeon: Rolm Bookbinder, MD;  Location: [facility];  Service: General;  Laterality: Left;  ? WISDOM TOOTH EXTRACTION    ? ? ?Family History  ?Problem Relation Age of Onset  ? Hypertension Mother   ? Diabetes Mother   ? Hypertension Father   ? Hypertension Maternal Grandmother   ? Diabetes Maternal Grandmother   ? Heart disease Maternal Grandfather   ? Hypertension Maternal Grandfather   ? Heart disease Paternal Grandfather   ? Hypertension Paternal Grandfather   ? Retinoblastoma Cousin   ?     dx 3 mo - left eye  ? ? ?Social History  ? ?Social History Narrative  ? Not on file  ?  ? ?Review of Systems ?General: Denies fevers, chills, weight loss ?CV: Denies chest pain, shortness of breath, palpitations ? ?Physical Exam ? ?  03/25/2022  ?  1:57 PM 03/20/2022  ? 11:40 AM 03/18/2022  ?  3:00 PM  ?Vitals with BMI  ?Height '5\' 7"'$     ?Weight 166 lbs    ?BMI 25.99    ?Systolic 106 269 485  ?Diastolic 49 58 57  ?Pulse 87 86 64  ?  ?General:  No acute distress,  Alert and oriented, Non-Toxic, Normal speech and affect ?Breast: She has mild ptosis on the border between grade 1 and grade 2.  She is reasonably symmetric.  Base width is 12.5 cm.  No obvious scars. ?Abdomen: Abdomen is soft nontender.  Feels like a small hernia inferiorly associated with her transverse C-section incision.  She clearly has rectus diastases and difficult to identify if there is a hernia component in the periumbilical area.  Minimal excess adipose tissue.  Moderate excess skin in the infra and supraumbilical areas. ? ?Assessment/Plan ?Patient is here to discuss breast reconstruction.  She has thought it  over and seems fairly committed to bilateral nipple sparing mastectomies.  She has seen Dr. Donne Hazel before and is comfortable with that surgical plan.  I do think she is a good candidate for immediate reconstruction.  Given the lack of soft tissue in the lower abdomen I do think her desired goals would be better achieved with implants.  They would require implants regardless of whether or not additional autologous tissue was used.  We discussed direct implant placement and placement of a tissue expander at the time of mastectomy.  She is planning to have hernia surgery and abdominoplasty combined down the line  regardless of the breast procedures performed.  In this case the advantages of direct implant are less as she will be having another surgery in the future anyhow.  She is also interested in participating in the decision regarding the size of the implant chosen which she would be more capable of doing with tissue expander placement.  For all these reasons tissue expander placement at the time of bilateral nipple sparing mastectomy seems like the best plan for her.  I explained the details of tissue expander reconstruction.  We discussed that the implants will be placed to have a second surgery.  More than likely given the details of her cancer we would want to get her fully expanded and start radiation in a timely manner after surgery and the exchange would need to be done 3 to 6 months after completion of radiation treatment.  At that time if she was interested we could consider a combined hernia abdominoplasty procedure.  We discussed risks of implant-based reconstruction that include bleeding, infection, damage to surrounding structures and need for additional procedures.  All of her questions were answered and she is interested in moving forward. ? ?Cindra Presume ?03/25/2022, 4:31 PM  ? ? ?  ?

## 2022-03-26 ENCOUNTER — Other Ambulatory Visit: Payer: 59

## 2022-03-29 ENCOUNTER — Ambulatory Visit: Payer: Managed Care, Other (non HMO)

## 2022-04-04 ENCOUNTER — Encounter: Payer: Self-pay | Admitting: Adult Health

## 2022-04-06 ENCOUNTER — Encounter: Payer: Self-pay | Admitting: *Deleted

## 2022-04-06 ENCOUNTER — Encounter: Payer: Self-pay | Admitting: Hematology and Oncology

## 2022-04-06 ENCOUNTER — Inpatient Hospital Stay: Payer: 59 | Admitting: Licensed Clinical Social Worker

## 2022-04-06 ENCOUNTER — Inpatient Hospital Stay: Payer: 59

## 2022-04-06 ENCOUNTER — Other Ambulatory Visit: Payer: Self-pay

## 2022-04-06 ENCOUNTER — Inpatient Hospital Stay (HOSPITAL_BASED_OUTPATIENT_CLINIC_OR_DEPARTMENT_OTHER): Payer: 59 | Admitting: Hematology and Oncology

## 2022-04-06 ENCOUNTER — Inpatient Hospital Stay: Payer: 59 | Attending: Licensed Clinical Social Worker

## 2022-04-06 VITALS — BP 105/63 | HR 81 | Temp 97.6°F | Resp 18 | Ht 67.0 in | Wt 160.4 lb

## 2022-04-06 DIAGNOSIS — F32A Depression, unspecified: Secondary | ICD-10-CM | POA: Diagnosis not present

## 2022-04-06 DIAGNOSIS — D701 Agranulocytosis secondary to cancer chemotherapy: Secondary | ICD-10-CM | POA: Insufficient documentation

## 2022-04-06 DIAGNOSIS — Z171 Estrogen receptor negative status [ER-]: Secondary | ICD-10-CM

## 2022-04-06 DIAGNOSIS — Z5112 Encounter for antineoplastic immunotherapy: Secondary | ICD-10-CM | POA: Insufficient documentation

## 2022-04-06 DIAGNOSIS — Z95828 Presence of other vascular implants and grafts: Secondary | ICD-10-CM

## 2022-04-06 DIAGNOSIS — Z5111 Encounter for antineoplastic chemotherapy: Secondary | ICD-10-CM | POA: Insufficient documentation

## 2022-04-06 DIAGNOSIS — D6481 Anemia due to antineoplastic chemotherapy: Secondary | ICD-10-CM | POA: Insufficient documentation

## 2022-04-06 DIAGNOSIS — R21 Rash and other nonspecific skin eruption: Secondary | ICD-10-CM | POA: Insufficient documentation

## 2022-04-06 DIAGNOSIS — R5383 Other fatigue: Secondary | ICD-10-CM | POA: Diagnosis not present

## 2022-04-06 DIAGNOSIS — C50411 Malignant neoplasm of upper-outer quadrant of right female breast: Secondary | ICD-10-CM | POA: Diagnosis present

## 2022-04-06 DIAGNOSIS — C773 Secondary and unspecified malignant neoplasm of axilla and upper limb lymph nodes: Secondary | ICD-10-CM | POA: Diagnosis not present

## 2022-04-06 LAB — CBC WITH DIFFERENTIAL (CANCER CENTER ONLY)
Abs Immature Granulocytes: 0 10*3/uL (ref 0.00–0.07)
Basophils Absolute: 0 10*3/uL (ref 0.0–0.1)
Basophils Relative: 2 %
Eosinophils Absolute: 0 10*3/uL (ref 0.0–0.5)
Eosinophils Relative: 0 %
HCT: 29.2 % — ABNORMAL LOW (ref 36.0–46.0)
Hemoglobin: 9.6 g/dL — ABNORMAL LOW (ref 12.0–15.0)
Immature Granulocytes: 0 %
Lymphocytes Relative: 29 %
Lymphs Abs: 0.6 10*3/uL — ABNORMAL LOW (ref 0.7–4.0)
MCH: 33.2 pg (ref 26.0–34.0)
MCHC: 32.9 g/dL (ref 30.0–36.0)
MCV: 101 fL — ABNORMAL HIGH (ref 80.0–100.0)
Monocytes Absolute: 0.4 10*3/uL (ref 0.1–1.0)
Monocytes Relative: 17 %
Neutro Abs: 1.1 10*3/uL — ABNORMAL LOW (ref 1.7–7.7)
Neutrophils Relative %: 52 %
Platelet Count: 290 10*3/uL (ref 150–400)
RBC: 2.89 MIL/uL — ABNORMAL LOW (ref 3.87–5.11)
RDW: 14.9 % (ref 11.5–15.5)
WBC Count: 2.1 10*3/uL — ABNORMAL LOW (ref 4.0–10.5)
nRBC: 0 % (ref 0.0–0.2)

## 2022-04-06 LAB — CMP (CANCER CENTER ONLY)
ALT: 10 U/L (ref 0–44)
AST: 13 U/L — ABNORMAL LOW (ref 15–41)
Albumin: 4.2 g/dL (ref 3.5–5.0)
Alkaline Phosphatase: 43 U/L (ref 38–126)
Anion gap: 4 — ABNORMAL LOW (ref 5–15)
BUN: 12 mg/dL (ref 6–20)
CO2: 28 mmol/L (ref 22–32)
Calcium: 9.5 mg/dL (ref 8.9–10.3)
Chloride: 108 mmol/L (ref 98–111)
Creatinine: 0.66 mg/dL (ref 0.44–1.00)
GFR, Estimated: >60 mL/min (ref >60)
Glucose, Bld: 99 mg/dL (ref 70–99)
Potassium: 4.1 mmol/L (ref 3.5–5.1)
Sodium: 140 mmol/L (ref 135–145)
Total Bilirubin: 0.3 mg/dL (ref 0.3–1.2)
Total Protein: 6.6 g/dL (ref 6.5–8.1)

## 2022-04-06 MED ORDER — HEPARIN SOD (PORK) LOCK FLUSH 100 UNIT/ML IV SOLN
500.0000 [IU] | Freq: Once | INTRAVENOUS | Status: AC | PRN
  Administered 2022-04-06: 500 [IU]

## 2022-04-06 MED ORDER — SODIUM CHLORIDE 0.9% FLUSH
10.0000 mL | INTRAVENOUS | Status: DC | PRN
  Administered 2022-04-06: 10 mL

## 2022-04-06 MED ORDER — PALONOSETRON HCL INJECTION 0.25 MG/5ML
0.2500 mg | Freq: Once | INTRAVENOUS | Status: AC
  Administered 2022-04-06: 0.25 mg via INTRAVENOUS
  Filled 2022-04-06: qty 5

## 2022-04-06 MED ORDER — SODIUM CHLORIDE 0.9 % IV SOLN
10.0000 mg | Freq: Once | INTRAVENOUS | Status: AC
  Administered 2022-04-06: 10 mg via INTRAVENOUS
  Filled 2022-04-06: qty 10

## 2022-04-06 MED ORDER — SODIUM CHLORIDE 0.9 % IV SOLN
150.0000 mg | Freq: Once | INTRAVENOUS | Status: AC
  Administered 2022-04-06: 150 mg via INTRAVENOUS
  Filled 2022-04-06: qty 150

## 2022-04-06 MED ORDER — SODIUM CHLORIDE 0.9 % IV SOLN
500.0000 mg/m2 | Freq: Once | INTRAVENOUS | Status: AC
  Administered 2022-04-06: 900 mg via INTRAVENOUS
  Filled 2022-04-06: qty 45

## 2022-04-06 MED ORDER — SODIUM CHLORIDE 0.9 % IV SOLN
200.0000 mg | Freq: Once | INTRAVENOUS | Status: AC
  Administered 2022-04-06: 200 mg via INTRAVENOUS
  Filled 2022-04-06: qty 200

## 2022-04-06 MED ORDER — DOXORUBICIN HCL CHEMO IV INJECTION 2 MG/ML
50.0000 mg/m2 | Freq: Once | INTRAVENOUS | Status: AC
  Administered 2022-04-06: 90 mg via INTRAVENOUS
  Filled 2022-04-06: qty 45

## 2022-04-06 MED ORDER — SODIUM CHLORIDE 0.9% FLUSH
10.0000 mL | Freq: Once | INTRAVENOUS | Status: AC
  Administered 2022-04-06: 10 mL

## 2022-04-06 MED ORDER — SODIUM CHLORIDE 0.9 % IV SOLN: Freq: Once | INTRAVENOUS | Status: AC

## 2022-04-06 NOTE — Progress Notes (Signed)
Blood return noted during Adriamycin administration ?

## 2022-04-06 NOTE — Progress Notes (Signed)
[facility] CSW Progress Note ? ?Clinical Social Worker met with patient in infusion. Provided 3rd [employer]. Pt reports feeling better today. Had increased anxiety over feeling lump in other breast but is less anxious after seeing MD. Her kids are also on break this week so she did not have to prep any school lessons. ? ?Pt has not heard from [facility] yet. CSW will re-send application today as this agency has changed addresses and has been having issues with mail being forwarded. ? ? ? ?Christeen Douglas LCSW ?

## 2022-04-06 NOTE — Patient Instructions (Signed)
[facility]  Discharge Instructions: ?Thank you for choosing [facility] to provide your oncology and hematology care.  ? ?If you have a lab appointment with the [facility], please go directly to the [facility] and check in at the registration area. ?  ?Wear comfortable clothing and clothing appropriate for easy access to any Portacath or PICC line.  ? ?We strive to give you quality time with your provider. You may need to reschedule your appointment if you arrive late (15 or more minutes).  Arriving late affects you and other patients whose appointments are after yours.  Also, if you miss three or more appointments without notifying the office, you may be dismissed from the clinic at the provider?s discretion.    ?  ?For prescription refill requests, have your pharmacy contact our office and allow 72 hours for refills to be completed.   ? ?Today you received the following chemotherapy and/or immunotherapy agents Adriamycin, Cytoxan, Keytruda    ?  ?To help prevent nausea and vomiting after your treatment, we encourage you to take your nausea medication as directed. ? ?BELOW ARE SYMPTOMS THAT SHOULD BE REPORTED IMMEDIATELY: ?*FEVER GREATER THAN 100.4 F (38 ?C) OR HIGHER ?*CHILLS OR SWEATING ?*NAUSEA AND VOMITING THAT IS NOT CONTROLLED WITH YOUR NAUSEA MEDICATION ?*UNUSUAL SHORTNESS OF BREATH ?*UNUSUAL BRUISING OR BLEEDING ?*URINARY PROBLEMS (pain or burning when urinating, or frequent urination) ?*BOWEL PROBLEMS (unusual diarrhea, constipation, pain near the anus) ?TENDERNESS IN MOUTH AND THROAT WITH OR WITHOUT PRESENCE OF ULCERS (sore throat, sores in mouth, or a toothache) ?UNUSUAL RASH, SWELLING OR PAIN  ?UNUSUAL VAGINAL DISCHARGE OR ITCHING  ? ?Items with * indicate a potential emergency and should be followed up as soon as possible or go to the Emergency Department if any problems should occur. ? ?Please show the CHEMOTHERAPY ALERT CARD or IMMUNOTHERAPY ALERT  CARD at check-in to the Emergency Department and triage nurse. ? ?Should you have questions after your visit or need to cancel or reschedule your appointment, please contact [facility]  Dept: [phone]  and follow the prompts.  Office hours are 8:00 a.m. to 4:30 p.m. Monday - Friday. Please note that voicemails left after 4:00 p.m. may not be returned until the following business day.  We are closed weekends and major holidays. You have access to a nurse at all times for urgent questions. Please call the main number to the clinic Dept: [phone] and follow the prompts. ? ? ?For any non-urgent questions, you may also contact your provider using MyChart. We now offer e-Visits for anyone 62 and older to request care online for non-urgent symptoms. For details visit [email or web address]. ?  ?Also download the MyChart app! Go to the app store, search "MyChart", open the app, select [facility], and log in with your MyChart username and password. ? ?Due to Covid, a mask is required upon entering the hospital/clinic. If you do not have a mask, one will be given to you upon arrival. For doctor visits, patients may have 1 support Giovani Neumeister aged 51 or older with them. For treatment visits, patients cannot have anyone with them due to current Covid guidelines and our immunocompromised population.  ? ?

## 2022-04-06 NOTE — Progress Notes (Signed)
? ?Patient Care Team: ?Sydney Rasmussen, MD as PCP - General (Family Medicine) ? ?DIAGNOSIS:  ?No diagnosis found. ? ? ?SUMMARY OF ONCOLOGIC HISTORY: ?Oncology History  ?Malignant neoplasm of upper-outer quadrant of right breast in female, estrogen receptor negative ([facility])  ?11/04/2021 Initial Diagnosis  ?  Patient has been breast-feeding.  Felt a painful right breast mass and an axillary mass.  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 with a Ki-67 of 40%, 1 abnormal lymph node 5.8 cm biopsy: Grade 3 IDC ER 5%, PR 0%, HER2 negative, Ki-67 40% ?  ?11/11/2021 Cancer Staging  ? Staging form: Breast, AJCC 8th Edition ?- Clinical stage from 11/11/2021: Stage IIIA (cT3, cN1, cM0, G3, ER+, PR+, HER2-) - Signed by Nicholas Lose, MD on 02/18/2022 ?Stage prefix: Initial diagnosis ?Histologic grading system: 3 grade system ? ?  ?11/30/2021 -  Chemotherapy  ? Patient is on Treatment Plan : BREAST Pembrolizumab + Carboplatin D1 + Paclitaxel D1,8,15 q21d X 4 cycles / Pembrolizumab + AC q21d x 4 cycles  ?   ?11/30/2021 Genetic Testing  ? Negative genetic testing on the CancerNext-Expanded+RNA insight panel.  The report date is 11/30/2021 ? ?The CancerNext-Expanded gene panel offered by [facility] and includes sequencing and rearrangement analysis for the following 77 genes: AIP, ALK, APC*, ATM*, AXIN2, BAP1, BARD1, BLM, BMPR1A, BRCA1*, BRCA2*, BRIP1*, CDC73, CDH1*, CDK4, CDKN1B, CDKN2A, CHEK2*, CTNNA1, DICER1, FANCC, FH, FLCN, GALNT12, KIF1B, LZTR1, MAX, MEN1, MET, MLH1*, MSH2*, MSH3, MSH6*, MUTYH*, NBN, NF1*, NF2, NTHL1, PALB2*, PHOX2B, PMS2*, POT1, PRKAR1A, PTCH1, PTEN*, RAD51C*, RAD51D*, RB1, RECQL, RET, SDHA, SDHAF2, SDHB, SDHC, SDHD, SMAD4, SMARCA4, SMARCB1, SMARCE1, STK11, SUFU, TMEM127, TP53*, TSC1, TSC2, VHL and XRCC2 (sequencing and deletion/duplication); EGFR, EGLN1, HOXB13, KIT, MITF, PDGFRA, POLD1, and POLE (sequencing only); EPCAM and GREM1 (deletion/duplication only). DNA  and RNA analyses performed for * genes.  ?  ? ? ?CHIEF COMPLIANT: Cycle 2 Keytruda + AC ? ?INTERVAL HISTORY: Sydney Rios is a  41 y.o. with above-mentioned history of breast cancer, currently on neoadjuvant chemotherapy with Keynote 522, AC with Keytruda ?She is here for an interim visit, she was worried about a couple lumps in the right breast which were previously palpated by Mendel Ryder.  She says that she does not want her tumor to grow out of her skin and she wanted another provider to examine her before she can proceed with chemotherapy.  She herself has only felt some abnormalities in the right upper outer quadrant.  The breast mass which she initially felt has completely resolved according to the patient she appears to be tolerating chemotherapy very well at this point.  She is wondering if she will get another MRI to look at response. ?Rest of the pertinent 10 point ROS reviewed and negative ? ?ALLERGIES:  has No Known Allergies. ? ?MEDICATIONS:  ?Current Outpatient Medications  ?Medication Sig Dispense Refill  ? Ascorbic Acid (VITAMIN C) 1000 MG tablet Take 1,000 mg by mouth 2 (two) times daily as needed (immune support (if sick)).    ? Cholecalciferol (VITAMIN D3) 125 MCG (5000 UT) TABS Take 5,000 Units by mouth daily as needed (immune support (if sick)).    ? clindamycin (CLINDAGEL) 1 % gel Apply topically 2 (two) times daily. 30 g 0  ? doxycycline (VIBRA-TABS) 100 MG tablet Take 1 tablet (100 mg total) by mouth daily. 30 tablet 1  ? furosemide (LASIX) 20 MG tablet TAKE 1/2-1 TAB EVERY OTHER DAY AS NEEDED 10 tablet  0  ? IVERMECTIN PO Take 20 mg by mouth in the morning.    ? levothyroxine (SYNTHROID) 125 MCG tablet Take 1 tablet (125 mcg total) by mouth daily before breakfast. 30 tablet 1  ? lidocaine-prilocaine (EMLA) cream Apply to affected area once 30 g 3  ? Menaquinone-7 (VITAMIN K2) 100 MCG CAPS Take 100 mcg by mouth daily as needed (immune support (if sick)).    ? NALTREXONE HCL PO Take 1 mg by  mouth in the morning.    ? nystatin (MYCOSTATIN) 100000 UNIT/ML suspension Take 5 mLs (500,000 Units total) by mouth 4 (four) times daily. 60 mL 0  ? ondansetron (ZOFRAN) 8 MG tablet TAKE 1 TABLET (8 MG TOTAL) BY MOUTH 2 (TWO) TIMES DAILY AS NEEDED. START ON THE THIRD DAY AFTER CARBOPLATIN AND AC CHEMOTHERAPY. 30 tablet 1  ? prochlorperazine (COMPAZINE) 10 MG tablet Take 1 tablet (10 mg total) by mouth every 6 (six) hours as needed (Nausea or vomiting). 30 tablet 1  ? valACYclovir (VALTREX) 1000 MG tablet Take 2 g by mouth 2 (two) times daily as needed (cold sore/fever blisters).    ? Zinc 50 MG TABS Take 50 mg by mouth daily as needed.    ? ?No current facility-administered medications for this visit.  ? ? ?PHYSICAL EXAMINATION: ?ECOG PERFORMANCE STATUS: 1 - Symptomatic but completely ambulatory ? ?Vitals:  ? 04/06/22 0950  ?BP: 105/63  ?Pulse: 81  ?Resp: 18  ?Temp: 97.6 ?F (36.4 ?C)  ?SpO2: 100%  ? ?Filed Weights  ? 04/06/22 0950  ?Weight: 160 lb 6.4 oz (72.8 kg)  ?  ? ?LABORATORY DATA:  ?I have reviewed the data as listed ? ?  Latest Ref Rng & Units 03/16/2022  ? 11:12 AM 03/09/2022  ?  3:08 PM 03/02/2022  ? 11:53 AM  ?CMP  ?Glucose 70 - 99 mg/dL 126   104   91    ?BUN 6 - 20 mg/dL $Remove'11   12   14    'DleAXqT$ ?Creatinine 0.44 - 1.00 mg/dL 0.65   0.67   0.58    ?Sodium 135 - 145 mmol/L 140   140   139    ?Potassium 3.5 - 5.1 mmol/L 3.7   3.8   4.2    ?Chloride 98 - 111 mmol/L 108   106   108    ?CO2 22 - 32 mmol/L $RemoveB'27   28   28    'IvkKvzZw$ ?Calcium 8.9 - 10.3 mg/dL 8.9   9.4   9.5    ?Total Protein 6.5 - 8.1 g/dL 6.2   6.1   6.4    ?Total Bilirubin 0.3 - 1.2 mg/dL 0.3   0.1   0.3    ?Alkaline Phos 38 - 126 U/L 47   69   59    ?AST 15 - 41 U/L $Remo'11   9   9    'zVDHg$ ?ALT 0 - 44 U/L $Remo'9   8   7    'akIbX$ ? ? ?Lab Results  ?Component Value Date  ? WBC 3.5 (L) 03/16/2022  ? HGB 9.3 (L) 03/16/2022  ? HCT 28.5 (L) 03/16/2022  ? MCV 102.5 (H) 03/16/2022  ? PLT 285 03/16/2022  ? NEUTROABS 2.4 03/16/2022  ? ? ?ASSESSMENT & PLAN:  ? ?Malignant neoplasm of upper-outer  quadrant of right breast in female, estrogen receptor negative ([facility]) ?11/04/2021: Patient has been breast-feeding.  Felt a painful right breast mass and an axillary mass.  Breast mass by ultrasound at 2:00: 2.6  cm biopsy revealed grade 3 IDC triple negative with a Ki-67 of 40%, 1 abnormal lymph node 5.8 cm biopsy: Grade 3 IDC ER 5%, PR 0%, HER2 negative, Ki-67 40% ?  ?CT CAP 11/20/2021: Right breast cancer with axillary lymph nodes, mildly enlarged right retropectoral lymph nodes, right internal mammary lymph nodes ?Breast MRI 11/17/2021: Known breast cancer 2 cm, right axillary lymph node, non-mass enhancement 7 cm, borderline enlarged right internal mammary lymph nodes ?  ?Treatment Plan: based on multidisciplinary tumor board: ?1. Neoadjuvant chemotherapy with Taxol weekly ?12 with Keytruda and carboplatin every 3 weeks x4 Adriamycin and Cytoxan, Keytruda dose dense ?4 followed by  followed by Beryle Flock maintenance for 1 year ?2. Followed by mastectomy with targeted axillary dissection ?3. Followed by adjuvant radiation therapy ? --------------------------------------------------------------------------------------------------------------------------- ?  ?Current Treatment: Completed 9 cycles of weekly Taxol (carboplatin and Keytruda every 3 weeks), today is cycle 3 of Adriamycin and Cytoxan with Keytruda ?Labs reviewed, ANC of 1100, neutropenic precautions discussed, okay to proceed with chemotherapy as planned since she appears to be responding very well and is being treated with a definitive intent.  She will also get the growth factor which will help with the neutropenia. ?  ?Chemo Toxicities: ?Facial Rash: No concerns for rash today,  ?fatigue ?Depression ?Chemotherapy-induced anemia today's hemoglobin is 9.6 ?Neutropenia related to chemotherapy ? ?Return to clinic in 3 weeks for cycle 4 ? ?With regards to the abnormal densities that patient has felt, I could not really appreciate any palpable tumor today in  her breast.  She most likely will also be getting another MRI to assess response soon.  She was reassured and she felt okay to move forward with chemotherapy today.  No palpable axillary lymphadenopathy e

## 2022-04-06 NOTE — Patient Instructions (Signed)

## 2022-04-06 NOTE — Progress Notes (Signed)
Per MD okay to treat today with ANC 1.1. ?

## 2022-04-07 ENCOUNTER — Encounter: Payer: Self-pay | Admitting: *Deleted

## 2022-04-08 ENCOUNTER — Inpatient Hospital Stay: Payer: 59

## 2022-04-08 ENCOUNTER — Other Ambulatory Visit: Payer: Self-pay

## 2022-04-08 ENCOUNTER — Other Ambulatory Visit: Payer: Self-pay | Admitting: Hematology and Oncology

## 2022-04-08 VITALS — BP 105/46 | HR 64 | Temp 98.3°F | Resp 18

## 2022-04-08 DIAGNOSIS — Z5112 Encounter for antineoplastic immunotherapy: Secondary | ICD-10-CM | POA: Diagnosis not present

## 2022-04-08 DIAGNOSIS — Z171 Estrogen receptor negative status [ER-]: Secondary | ICD-10-CM

## 2022-04-08 MED ORDER — PEGFILGRASTIM-BMEZ 6 MG/0.6ML ~~LOC~~ SOSY
6.0000 mg | PREFILLED_SYRINGE | Freq: Once | SUBCUTANEOUS | Status: AC
  Administered 2022-04-08: 6 mg via SUBCUTANEOUS
  Filled 2022-04-08: qty 0.6

## 2022-04-09 ENCOUNTER — Encounter: Payer: Self-pay | Admitting: Hematology and Oncology

## 2022-04-11 ENCOUNTER — Other Ambulatory Visit: Payer: Self-pay | Admitting: Hematology and Oncology

## 2022-04-12 ENCOUNTER — Encounter: Payer: Self-pay | Admitting: Hematology and Oncology

## 2022-04-13 ENCOUNTER — Encounter: Payer: Self-pay | Admitting: *Deleted

## 2022-04-13 NOTE — Progress Notes (Signed)
? ?Patient Care Team: ?[name], MD as PCP - General (Family Medicine) ?Mauro Kaufmann, RN as Oncology Nurse Navigator ?Rockwell Germany, RN as Oncology Nurse Navigator ?Nicholas Lose, MD as Consulting Physician (Hematology and Oncology) ? ?DIAGNOSIS:  ?Encounter Diagnosis  ?Name Primary?  ? Malignant neoplasm of upper-outer quadrant of right breast in female, estrogen receptor negative ([place])   ? ? ?SUMMARY OF ONCOLOGIC HISTORY: ?Oncology History  ?Malignant neoplasm of upper-outer quadrant of right breast in female, estrogen receptor negative ([place])  ?11/04/2021 Initial Diagnosis  ?  Patient has been breast-feeding.  Felt a painful right breast mass and an axillary mass.  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 with a Ki-67 of 40%, 1 abnormal lymph node 5.8 cm biopsy: Grade 3 IDC ER 5%, PR 0%, HER2 negative, Ki-67 40% ?  ?11/11/2021 Cancer Staging  ? Staging form: Breast, AJCC 8th Edition ?- Clinical stage from 11/11/2021: Stage IIIA (cT3, cN1, cM0, G3, ER+, PR+, HER2-) - Signed by Nicholas Lose, MD on 02/18/2022 ?Stage prefix: Initial diagnosis ?Histologic grading system: 3 grade system ? ?  ?11/30/2021 -  Chemotherapy  ? Patient is on Treatment Plan : BREAST Pembrolizumab + Carboplatin D1 + Paclitaxel D1,8,15 q21d X 4 cycles / Pembrolizumab + AC q21d x 4 cycles  ? ?  ?  ?11/30/2021 Genetic Testing  ? Negative genetic testing on the CancerNext-Expanded+RNA insight panel.  The report date is 11/30/2021 ? ?The CancerNext-Expanded gene panel offered by [facility] and includes sequencing and rearrangement analysis for the following 77 genes: AIP, ALK, APC*, ATM*, AXIN2, BAP1, BARD1, BLM, BMPR1A, BRCA1*, BRCA2*, BRIP1*, CDC73, CDH1*, CDK4, CDKN1B, CDKN2A, CHEK2*, CTNNA1, DICER1, FANCC, FH, FLCN, GALNT12, KIF1B, LZTR1, MAX, MEN1, MET, MLH1*, MSH2*, MSH3, MSH6*, MUTYH*, NBN, NF1*, NF2, NTHL1, PALB2*, PHOX2B, PMS2*, POT1, PRKAR1A, PTCH1, PTEN*, RAD51C*, RAD51D*, RB1,  RECQL, RET, SDHA, SDHAF2, SDHB, SDHC, SDHD, SMAD4, SMARCA4, SMARCB1, SMARCE1, STK11, SUFU, TMEM127, TP53*, TSC1, TSC2, VHL and XRCC2 (sequencing and deletion/duplication); EGFR, EGLN1, HOXB13, KIT, MITF, PDGFRA, POLD1, and POLE (sequencing only); EPCAM and GREM1 (deletion/duplication only). DNA and RNA analyses performed for * genes.  ?  ? ? ?CHIEF COMPLIANT: Cycle 4 Keytruda + AC ? ?INTERVAL HISTORY: Sydney Rios is a 41 y.o. with above-mentioned history of breast cancer, currently on neoadjuvant chemotherapy with Taxol. She presents to the clinic today for toxicity check and treatment. She states that she had some fatigue. She denies nausea. She states she doesn't want to eat. She state that she does eat more vegetables. ?Her major complaint is profound fatigue as well as decreased taste and appetite.  She also has some underlying depression. ? ? ?ALLERGIES:  has No Known Allergies. ? ?MEDICATIONS:  ?Current Outpatient Medications  ?Medication Sig Dispense Refill  ? Ascorbic Acid (VITAMIN C) 1000 MG tablet Take 1,000 mg by mouth 2 (two) times daily as needed (immune support (if sick)).    ? Cholecalciferol (VITAMIN D3) 125 MCG (5000 UT) TABS Take 5,000 Units by mouth daily as needed (immune support (if sick)).    ? clindamycin (CLINDAGEL) 1 % gel Apply topically 2 (two) times daily. 30 g 0  ? doxycycline (VIBRA-TABS) 100 MG tablet Take 1 tablet (100 mg total) by mouth daily. 30 tablet 1  ? furosemide (LASIX) 20 MG tablet TAKE 1/2-1 TAB EVERY OTHER DAY AS NEEDED 10 tablet 0  ? IVERMECTIN PO Take 20 mg by mouth in the morning.    ? levothyroxine (SYNTHROID)  125 MCG tablet Take 1 tablet (125 mcg total) by mouth daily before breakfast. 30 tablet 1  ? lidocaine-prilocaine (EMLA) cream Apply to affected area once 30 g 3  ? Menaquinone-7 (VITAMIN K2) 100 MCG CAPS Take 100 mcg by mouth daily as needed (immune support (if sick)).    ? NALTREXONE HCL PO Take 1 mg by mouth in the morning.    ? nystatin (MYCOSTATIN) 100000  UNIT/ML suspension TAKE 5 MLS (500,000 UNITS TOTAL) BY MOUTH 4 (FOUR) TIMES DAILY. 60 mL 0  ? ondansetron (ZOFRAN) 8 MG tablet TAKE 1 TABLET (8 MG TOTAL) BY MOUTH 2 (TWO) TIMES DAILY AS NEEDED. START ON THE THIRD DAY AFTER CARBOPLATIN AND AC CHEMOTHERAPY. 30 tablet 1  ? prochlorperazine (COMPAZINE) 10 MG tablet Take 1 tablet (10 mg total) by mouth every 6 (six) hours as needed (Nausea or vomiting). 30 tablet 1  ? valACYclovir (VALTREX) 1000 MG tablet Take 2 g by mouth 2 (two) times daily as needed (cold sore/fever blisters).    ? Zinc 50 MG TABS Take 50 mg by mouth daily as needed.    ? ?No current facility-administered medications for this visit.  ? ? ?PHYSICAL EXAMINATION: ?ECOG PERFORMANCE STATUS: 1 - Symptomatic but completely ambulatory ? ?Vitals:  ? 04/27/22 1048  ?BP: 108/61  ?Pulse: 81  ?Resp: 16  ?Temp: 98.1 ?F (36.7 ?C)  ?SpO2: 100%  ? ?Filed Weights  ? 04/27/22 1048  ?Weight: 157 lb 11.2 oz (71.5 kg)  ?  ? ?LABORATORY DATA:  ?I have reviewed the data as listed ? ?  Latest Ref Rng & Units 04/06/2022  ? 10:57 AM 03/16/2022  ? 11:12 AM 03/09/2022  ?  3:08 PM  ?CMP  ?Glucose 70 - 99 mg/dL 99   126   104    ?BUN 6 - 20 mg/dL _0 ?Creatinine 0.44 - 1.00 mg/dL 0.66   0.65   0.67    ?Sodium 135 - 145 mmol/L 140   140   140    ?Potassium 3.5 - 5.1 mmol/L 4.1   3.7   3.8    ?Chloride 98 - 111 mmol/L 108   108   106    ?CO2 22 - 32 mmol/L _1 ?Calcium 8.9 - 10.3 mg/dL 9.5   8.9   9.4    ?Total Protein 6.5 - 8.1 g/dL 6.6   6.2   6.1    ?Total Bilirubin 0.3 - 1.2 mg/dL 0.3   0.3   0.1    ?Alkaline Phos 38 - 126 U/L 43   47   69    ?AST 15 - 41 U/L _2 ?ALT 0 - 44 U/L _3 ? ? ?Lab Results  ?Component Value Date  ? WBC 2.0 (L) 04/27/2022  ? HGB 10.1 (L) 04/27/2022  ? HCT 30.2 (L) 04/27/2022  ? MCV 100.3 (H) 04/27/2022  ? PLT 257 04/27/2022  ? NEUTROABS 1.1 (L) 04/27/2022  ? ? ?ASSESSMENT & PLAN:  ?Malignant neoplasm of upper-outer quadrant of right breast in female, estrogen  receptor negative ([place]) ?11/04/2021: Patient has been breast-feeding.  Felt a painful right breast mass and an axillary mass.  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 with a Ki-67 of 40%, 1 abnormal lymph node 5.8 cm  biopsy: Grade 3 IDC ER 5%, PR 0%, HER2 negative, Ki-67 40% ?  ?CT CAP 11/20/2021: Right breast cancer with axillary lymph nodes, mildly enlarged right retropectoral lymph nodes, right internal mammary lymph nodes ?Breast MRI 11/17/2021: Known breast cancer 2 cm, right axillary lymph node, non-mass enhancement 7 cm, borderline enlarged right internal mammary lymph nodes ?  ?Treatment Plan: based on multidisciplinary tumor board: ?1. Neoadjuvant chemotherapy with Taxol weekly ?12 with Keytruda and carboplatin every 3 weeks x4 Adriamycin and Cytoxan, Keytruda dose dense ?4 followed by  followed by Beryle Flock maintenance for 1 year ?2. Followed by mastectomy with targeted axillary dissection ?3. Followed by adjuvant radiation therapy ? --------------------------------------------------------------------------------------------------------------------------- ?  ?Current Treatment: Completed 9 cycles of weekly Taxol (carboplatin and Keytruda every 3 weeks), today is cycle 4 of Adriamycin and Cytoxan with Keytruda ? ?Chemo Toxicities: ?Facial Rash: No concerns for rash today,  ?fatigue: Persistent fatigue ?Depression ?Chemotherapy-induced anemia today's hemoglobin is 10.1 ?Neutropenia related to chemotherapy: Okay to treat with an ANC 1.1 today ? ?Breast MRI scheduled for 05/04/2022 ?She has appointments to see her surgeon afterwards.  She is debating between expander versus implant versus Diep flap ? ?Return to clinic after surgery to discuss pathology report. ?She will continue every 3-week Keytruda maintenance. ? ? ? ?No orders of the defined types were placed in this encounter. ? ?The patient has a good understanding of the overall plan. she agrees with it. she will  call with any problems that may develop before the next visit here. ?Total time spent: 30 mins including face to face time and time spent for planning, charting and co-ordination of care ? ? Cassandria Santee

## 2022-04-19 ENCOUNTER — Ambulatory Visit: Payer: Managed Care, Other (non HMO)

## 2022-04-27 ENCOUNTER — Other Ambulatory Visit: Payer: Self-pay

## 2022-04-27 ENCOUNTER — Inpatient Hospital Stay: Payer: 59

## 2022-04-27 ENCOUNTER — Inpatient Hospital Stay: Payer: 59 | Admitting: Licensed Clinical Social Worker

## 2022-04-27 ENCOUNTER — Inpatient Hospital Stay (HOSPITAL_BASED_OUTPATIENT_CLINIC_OR_DEPARTMENT_OTHER): Payer: 59 | Admitting: Hematology and Oncology

## 2022-04-27 ENCOUNTER — Encounter: Payer: Self-pay | Admitting: *Deleted

## 2022-04-27 ENCOUNTER — Inpatient Hospital Stay: Payer: 59 | Attending: Licensed Clinical Social Worker

## 2022-04-27 DIAGNOSIS — D6481 Anemia due to antineoplastic chemotherapy: Secondary | ICD-10-CM | POA: Diagnosis not present

## 2022-04-27 DIAGNOSIS — Z5111 Encounter for antineoplastic chemotherapy: Secondary | ICD-10-CM | POA: Diagnosis present

## 2022-04-27 DIAGNOSIS — Z79899 Other long term (current) drug therapy: Secondary | ICD-10-CM | POA: Insufficient documentation

## 2022-04-27 DIAGNOSIS — C50411 Malignant neoplasm of upper-outer quadrant of right female breast: Secondary | ICD-10-CM | POA: Insufficient documentation

## 2022-04-27 DIAGNOSIS — D701 Agranulocytosis secondary to cancer chemotherapy: Secondary | ICD-10-CM | POA: Diagnosis not present

## 2022-04-27 DIAGNOSIS — Z95828 Presence of other vascular implants and grafts: Secondary | ICD-10-CM

## 2022-04-27 DIAGNOSIS — Z171 Estrogen receptor negative status [ER-]: Secondary | ICD-10-CM

## 2022-04-27 DIAGNOSIS — Z5112 Encounter for antineoplastic immunotherapy: Secondary | ICD-10-CM | POA: Insufficient documentation

## 2022-04-27 LAB — CMP (CANCER CENTER ONLY)
ALT: 11 U/L (ref 0–44)
AST: 12 U/L — ABNORMAL LOW (ref 15–41)
Albumin: 4.3 g/dL (ref 3.5–5.0)
Alkaline Phosphatase: 42 U/L (ref 38–126)
Anion gap: 4 — ABNORMAL LOW (ref 5–15)
BUN: 9 mg/dL (ref 6–20)
CO2: 29 mmol/L (ref 22–32)
Calcium: 9.6 mg/dL (ref 8.9–10.3)
Chloride: 109 mmol/L (ref 98–111)
Creatinine: 0.69 mg/dL (ref 0.44–1.00)
GFR, Estimated: >60 mL/min (ref >60)
Glucose, Bld: 103 mg/dL — ABNORMAL HIGH (ref 70–99)
Potassium: 3.8 mmol/L (ref 3.5–5.1)
Sodium: 142 mmol/L (ref 135–145)
Total Bilirubin: 0.3 mg/dL (ref 0.3–1.2)
Total Protein: 6.5 g/dL (ref 6.5–8.1)

## 2022-04-27 LAB — CBC WITH DIFFERENTIAL (CANCER CENTER ONLY)
Abs Immature Granulocytes: 0.01 10*3/uL (ref 0.00–0.07)
Basophils Absolute: 0 10*3/uL (ref 0.0–0.1)
Basophils Relative: 1 %
Eosinophils Absolute: 0 10*3/uL (ref 0.0–0.5)
Eosinophils Relative: 1 %
HCT: 30.2 % — ABNORMAL LOW (ref 36.0–46.0)
Hemoglobin: 10.1 g/dL — ABNORMAL LOW (ref 12.0–15.0)
Immature Granulocytes: 1 %
Lymphocytes Relative: 25 %
Lymphs Abs: 0.5 10*3/uL — ABNORMAL LOW (ref 0.7–4.0)
MCH: 33.6 pg (ref 26.0–34.0)
MCHC: 33.4 g/dL (ref 30.0–36.0)
MCV: 100.3 fL — ABNORMAL HIGH (ref 80.0–100.0)
Monocytes Absolute: 0.3 10*3/uL (ref 0.1–1.0)
Monocytes Relative: 15 %
Neutro Abs: 1.1 10*3/uL — ABNORMAL LOW (ref 1.7–7.7)
Neutrophils Relative %: 57 %
Platelet Count: 257 10*3/uL (ref 150–400)
RBC: 3.01 MIL/uL — ABNORMAL LOW (ref 3.87–5.11)
RDW: 14.2 % (ref 11.5–15.5)
WBC Count: 2 10*3/uL — ABNORMAL LOW (ref 4.0–10.5)
nRBC: 0 % (ref 0.0–0.2)

## 2022-04-27 LAB — TSH: TSH: 8.983 u[IU]/mL — ABNORMAL HIGH (ref 0.350–4.500)

## 2022-04-27 MED ORDER — DOXORUBICIN HCL CHEMO IV INJECTION 2 MG/ML
50.0000 mg/m2 | Freq: Once | INTRAVENOUS | Status: AC
  Administered 2022-04-27: 90 mg via INTRAVENOUS
  Filled 2022-04-27: qty 45

## 2022-04-27 MED ORDER — SODIUM CHLORIDE 0.9% FLUSH
10.0000 mL | INTRAVENOUS | Status: DC | PRN
  Administered 2022-04-27: 10 mL

## 2022-04-27 MED ORDER — SODIUM CHLORIDE 0.9% FLUSH
10.0000 mL | Freq: Once | INTRAVENOUS | Status: AC
  Administered 2022-04-27: 10 mL

## 2022-04-27 MED ORDER — SODIUM CHLORIDE 0.9 % IV SOLN
500.0000 mg/m2 | Freq: Once | INTRAVENOUS | Status: AC
  Administered 2022-04-27: 900 mg via INTRAVENOUS
  Filled 2022-04-27: qty 45

## 2022-04-27 MED ORDER — SODIUM CHLORIDE 0.9 % IV SOLN
150.0000 mg | Freq: Once | INTRAVENOUS | Status: AC
  Administered 2022-04-27: 150 mg via INTRAVENOUS
  Filled 2022-04-27: qty 150

## 2022-04-27 MED ORDER — SODIUM CHLORIDE 0.9 % IV SOLN
200.0000 mg | Freq: Once | INTRAVENOUS | Status: AC
  Administered 2022-04-27: 200 mg via INTRAVENOUS
  Filled 2022-04-27: qty 200

## 2022-04-27 MED ORDER — SODIUM CHLORIDE 0.9 % IV SOLN
10.0000 mg | Freq: Once | INTRAVENOUS | Status: AC
  Administered 2022-04-27: 10 mg via INTRAVENOUS
  Filled 2022-04-27: qty 10

## 2022-04-27 MED ORDER — SODIUM CHLORIDE 0.9 % IV SOLN: Freq: Once | INTRAVENOUS | Status: AC

## 2022-04-27 MED ORDER — PALONOSETRON HCL INJECTION 0.25 MG/5ML
0.2500 mg | Freq: Once | INTRAVENOUS | Status: AC
  Administered 2022-04-27: 0.25 mg via INTRAVENOUS
  Filled 2022-04-27: qty 5

## 2022-04-27 MED ORDER — HEPARIN SOD (PORK) LOCK FLUSH 100 UNIT/ML IV SOLN
500.0000 [IU] | Freq: Once | INTRAVENOUS | Status: AC | PRN
  Administered 2022-04-27: 500 [IU]

## 2022-04-27 NOTE — Progress Notes (Signed)
Per Sydney Adie MD, ok to treat with ANC 1.1. ?

## 2022-04-27 NOTE — Patient Instructions (Signed)
[facility]  Discharge Instructions: ?Thank you for choosing [facility] to provide your oncology and hematology care.  ? ?If you have a lab appointment with the [facility], please go directly to the [facility] and check in at the registration area. ?  ?Wear comfortable clothing and clothing appropriate for easy access to any Portacath or PICC line.  ? ?We strive to give you quality time with your provider. You may need to reschedule your appointment if you arrive late (15 or more minutes).  Arriving late affects you and other patients whose appointments are after yours.  Also, if you miss three or more appointments without notifying the office, you may be dismissed from the clinic at the provider?s discretion.    ?  ?For prescription refill requests, have your pharmacy contact our office and allow 72 hours for refills to be completed.   ? ?Today you received the following chemotherapy and/or immunotherapy agents: Keytruda, Doxorubicin, Cytoxan.     ?  ?To help prevent nausea and vomiting after your treatment, we encourage you to take your nausea medication as directed. ? ?BELOW ARE SYMPTOMS THAT SHOULD BE REPORTED IMMEDIATELY: ?*FEVER GREATER THAN 100.4 F (38 ?C) OR HIGHER ?*CHILLS OR SWEATING ?*NAUSEA AND VOMITING THAT IS NOT CONTROLLED WITH YOUR NAUSEA MEDICATION ?*UNUSUAL SHORTNESS OF BREATH ?*UNUSUAL BRUISING OR BLEEDING ?*URINARY PROBLEMS (pain or burning when urinating, or frequent urination) ?*BOWEL PROBLEMS (unusual diarrhea, constipation, pain near the anus) ?TENDERNESS IN MOUTH AND THROAT WITH OR WITHOUT PRESENCE OF ULCERS (sore throat, sores in mouth, or a toothache) ?UNUSUAL RASH, SWELLING OR PAIN  ?UNUSUAL VAGINAL DISCHARGE OR ITCHING  ? ?Items with * indicate a potential emergency and should be followed up as soon as possible or go to the Emergency Department if any problems should occur. ? ?Please show the CHEMOTHERAPY ALERT CARD or IMMUNOTHERAPY  ALERT CARD at check-in to the Emergency Department and triage nurse. ? ?Should you have questions after your visit or need to cancel or reschedule your appointment, please contact [facility]  Dept: [phone]  and follow the prompts.  Office hours are 8:00 a.m. to 4:30 p.m. Monday - Friday. Please note that voicemails left after 4:00 p.m. may not be returned until the following business day.  We are closed weekends and major holidays. You have access to a nurse at all times for urgent questions. Please call the main number to the clinic Dept: [phone] and follow the prompts. ? ? ?For any non-urgent questions, you may also contact your provider using MyChart. We now offer e-Visits for anyone 39 and older to request care online for non-urgent symptoms. For details visit [email or web address]. ?  ?Also download the MyChart app! Go to the app store, search "MyChart", open the app, select [facility], and log in with your MyChart username and password. ? ?Due to Covid, a mask is required upon entering the hospital/clinic. If you do not have a mask, one will be given to you upon arrival. For doctor visits, patients may have 1 support person aged 5 or older with them. For treatment visits, patients cannot have anyone with them due to current Covid guidelines and our immunocompromised population.  ? ?

## 2022-04-27 NOTE — Progress Notes (Signed)
[facility] CSW Progress Note ? ?Clinical Social Worker met with patient to provide ongoing support. Provided last [employer] card.  Pt is struggling with mood lately and anxiety about her relationship. CSW reviewed options for support including counseling, peer mentor, support group and pt agreed to counseling session- scheduled for next week. ? ?Pt is completing chemo today and is looking forward to her kids joining her to ring the bell. She is deciding on reconstruction options which has been difficult. ? ?Next visit: counseling on 05/06/22 ? ?Christeen Douglas LCSW ?

## 2022-04-27 NOTE — Assessment & Plan Note (Addendum)
11/04/2021:?Patient has been breast-feeding. ?Felt a painful right breast mass and an axillary mass. ?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 with a Ki-67 of 40%, 1 abnormal lymph node 5.8 cm biopsy: Grade 3 IDC ER 5%, PR 0%, HER2 negative, Ki-67 40% ?? ?CT CAP 11/20/2021: Right breast cancer with axillary lymph nodes, mildly enlarged right retropectoral lymph nodes, right internal mammary lymph nodes ?Breast MRI 11/17/2021: Known breast cancer 2 cm, right axillary lymph node, non-mass enhancement 7 cm, borderline enlarged right internal mammary lymph nodes ?? ?Treatment Plan:?based on multidisciplinary tumor board: ?1. Neoadjuvant chemotherapy with Taxol weekly ?12 with?Keytruda and?carboplatin every 3 weeks x4 Adriamycin and Cytoxan, Keytruda?dose dense ?4 followed by ?followed by Beryle Flock maintenance for 1 year ?2. Followed by?mastectomy?with targeted axillary dissection ?3. Followed by adjuvant radiation therapy ??--------------------------------------------------------------------------------------------------------------------------- ?? ?Current Treatment: Completed 9 cycles of?weekly?Taxol?(carboplatin and Keytruda?every 3 weeks), today?is cycle 4 of Adriamycin and Cytoxan with Keytruda ? ?Chemo Toxicities: ?1. Facial Rash: No concerns for rash today,  ?2. fatigue: Persistent fatigue ?3. Depression ?4. Chemotherapy-induced anemia today's hemoglobin is 9.6 ?5. Neutropenia related to chemotherapy: Okay to treat with an ANC 1.1 today ? ?Breast MRI scheduled for 05/04/2022 ?She has appointments to see her surgeon afterwards. ? ?Return to clinic after surgery to discuss pathology report. ?She will continue every 3-week Keytruda maintenance. ?

## 2022-04-28 ENCOUNTER — Other Ambulatory Visit: Payer: Self-pay | Admitting: Hematology and Oncology

## 2022-04-28 LAB — T4: T4, Total: 7.2 ug/dL (ref 4.5–12.0)

## 2022-04-28 NOTE — Progress Notes (Signed)
DISCONTINUE ON PATHWAY REGIMEN - Breast ? ? ?  Cycles 1 through 4: A cycle is every 21 days: ?    Pembrolizumab  ?    Paclitaxel  ?    Carboplatin  ?    Filgrastim-xxxx  ?  Cycles 5 through 8: A cycle is every 21 days: ?    Pembrolizumab  ?    Doxorubicin  ?    Cyclophosphamide  ?    Pegfilgrastim-xxxx  ? ?**Always confirm dose/schedule in your pharmacy ordering system** ? ?REASON: Other Reason ?PRIOR TREATMENT: [id number]: Pembrolizumab 200 mg D1 + Paclitaxel 80 mg/m2 D1, 8, 15 + Carboplatin AUC=5 D1 q21 Days x 12 Weeks, Followed by Pembrolizumab 200 mg + Doxorubicin + Cyclophosphamide q21 Days x 12 Weeks, Followed by Surgery ?TREATMENT RESPONSE: N/A - Adjuvant Therapy ? ?START ON PATHWAY REGIMEN - Breast ? ? ?  A cycle is every 21 days: ?    Pembrolizumab  ? ?**Always confirm dose/schedule in your pharmacy ordering system** ? ?Patient Characteristics: ?Post-Neoadjuvant Therapy and Resection, HER2 Negative/Unknown/Equivocal, No Residual Disease, ER Negative/Unknown, Adjuvant Therapy - Continuation After Neoadjuvant Therapy ?Therapeutic Status: Post-Neoadjuvant Therapy and Resection ?Residual Invasive Disease Post-Neoadjuvant Therapy<= No ?ER Status: Negative (-) ?HER2 Status: Negative (-) ?PR Status: Negative (-) ?Intent of Therapy: ?Curative Intent, Discussed with Patient ?

## 2022-04-29 ENCOUNTER — Telehealth: Payer: Self-pay | Admitting: Plastic Surgery

## 2022-04-29 ENCOUNTER — Inpatient Hospital Stay: Payer: 59

## 2022-04-29 ENCOUNTER — Other Ambulatory Visit: Payer: Self-pay

## 2022-04-29 VITALS — BP 107/59 | HR 75 | Temp 98.4°F | Resp 18

## 2022-04-29 DIAGNOSIS — Z5112 Encounter for antineoplastic immunotherapy: Secondary | ICD-10-CM | POA: Diagnosis not present

## 2022-04-29 DIAGNOSIS — C50411 Malignant neoplasm of upper-outer quadrant of right female breast: Secondary | ICD-10-CM

## 2022-04-29 MED ORDER — PEGFILGRASTIM-BMEZ 6 MG/0.6ML ~~LOC~~ SOSY
6.0000 mg | PREFILLED_SYRINGE | Freq: Once | SUBCUTANEOUS | Status: AC
  Administered 2022-04-29: 6 mg via SUBCUTANEOUS
  Filled 2022-04-29: qty 0.6

## 2022-04-29 NOTE — Telephone Encounter (Signed)
S/W scheduler for [facility] regarding next steps so we can coordinate surgery. Patient has another MRI 5/9 and Dr. Donne Hazel will discuss results and next steps with her. We will reconnect after follow up appt.  ?

## 2022-05-03 ENCOUNTER — Ambulatory Visit: Payer: 59

## 2022-05-03 ENCOUNTER — Encounter: Payer: Self-pay | Admitting: General Practice

## 2022-05-03 ENCOUNTER — Other Ambulatory Visit: Payer: 59

## 2022-05-03 NOTE — Progress Notes (Signed)
[facility] Note ? ?Attempted to reach out by phone per referrals from nursing, but Ms Garabedian's voicemail is full. Mailing handwritten note of introduction with my brochure, inviting connection however would be meaningful. ? ? ?Chaplain Lorrin Jackson, MDiv, [facility] ?Pager [phone] ?Voicemail [phone]  ?

## 2022-05-04 ENCOUNTER — Ambulatory Visit
Admission: RE | Admit: 2022-05-04 | Discharge: 2022-05-04 | Disposition: A | Discharge status: Home / Self Care | Payer: 59 | Source: Ambulatory Visit | Attending: Hematology and Oncology | Admitting: Hematology and Oncology

## 2022-05-04 DIAGNOSIS — Z171 Estrogen receptor negative status [ER-]: Secondary | ICD-10-CM

## 2022-05-04 IMAGING — MR MR BREAST BILAT WO/W CM
7 of 12 series · 28 of 48 positions shown · IV contrast (gadavist 7mL)
Comparison: Prior studies including [DATE]
COMPARISON: Prior studies including [DATE]

Addendum:
CLINICAL DATA: Assess treatment response. Ultrasound-guided core
biopsies performed of RIGHT breast mass and RIGHT axillary lymph
node on [DATE] demonstrated invasive ductal carcinoma in the 2
o'clock location of the RIGHT breast and invasive ductal carcinoma
in the RIGHT axilla. Subsequent MRI demonstrated these findings in
addition to non masslike enhancement throughout the central RIGHT
breast spanning at least 7 centimeters. Borderline RIGHT internal
mammary lymph nodes were also present, largest 6 millimeters.

EXAM:
BILATERAL BREAST MRI WITH AND WITHOUT CONTRAST
TECHNIQUE: Multiplanar, multisequence MR images of both breasts were obtained
prior to and following the intravenous administration of 7 ml of
Gadavist

[Series 2: t2_tirm_tra ipat (a-p) · axial · 3.0mm · 0.70mm/px · 1 of 60 slices shown]
[im 1/60]
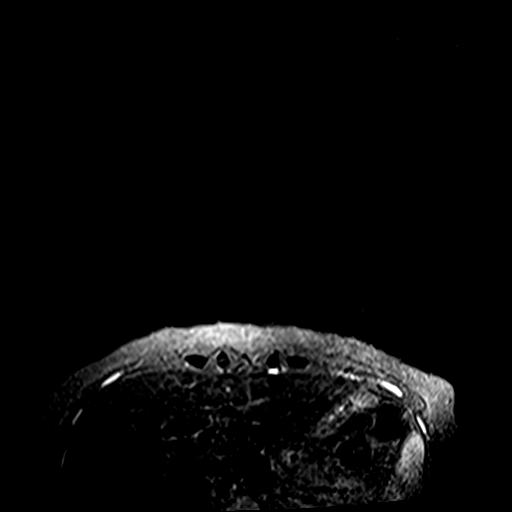

[Series 3: fl3d pre-cm no · axial · non-contrast · 1.2mm · 0.89mm/px · z∈[-113,+78]mm · 5 of 160 slices shown]
[im 1/160]
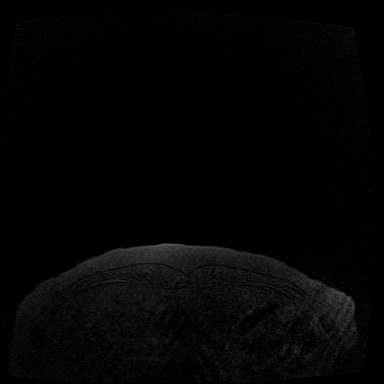
[im 40/160]
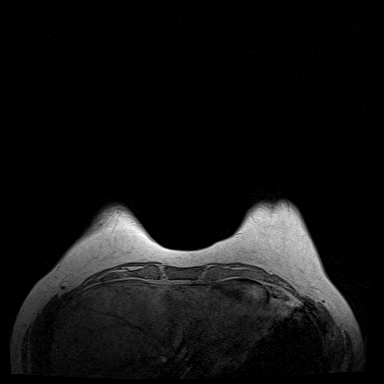
[im 80/160]
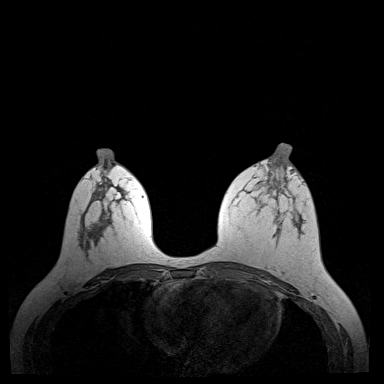
[im 120/160]
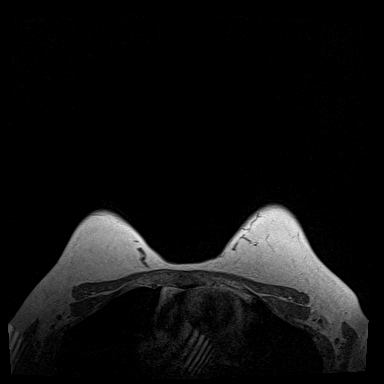
[im 160/160]
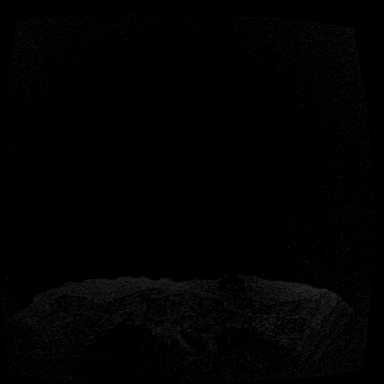

[Series 4: fl3d pre-cm · axial · non-contrast · 1.2mm · 0.89mm/px · z∈[-113,+78]mm · 5 of 160 slices shown]
[im 1/160]
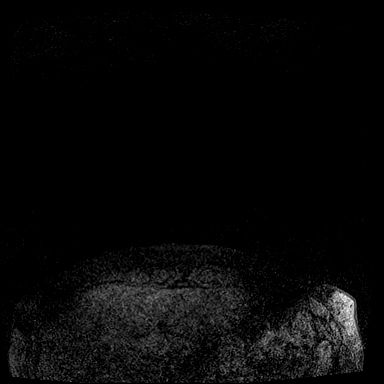
[im 40/160]
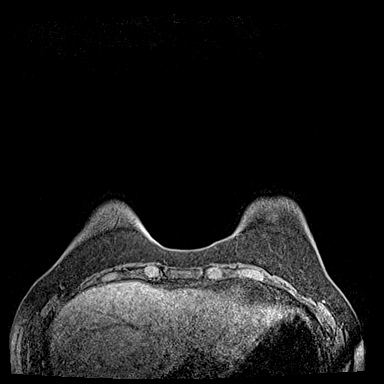
[im 80/160]
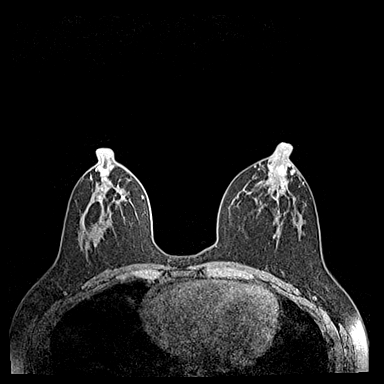
[im 120/160]
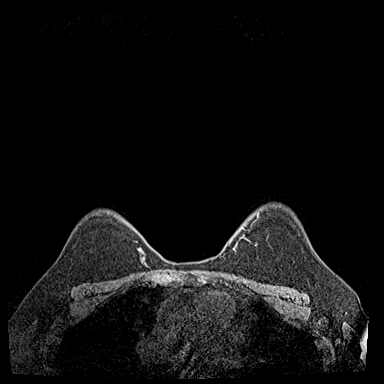
[im 160/160]
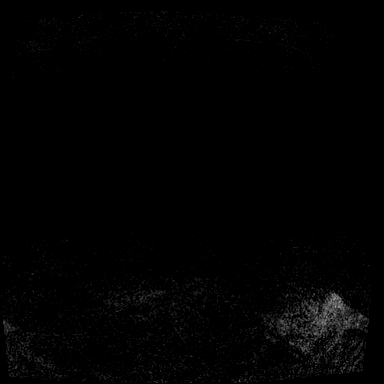

[Series 5: fl3d post-cm 20 · axial · 1.2mm · 0.89mm/px · z∈[-113,+78]mm · 5 of 160 slices shown (1 of 3)]
[im 1/160]
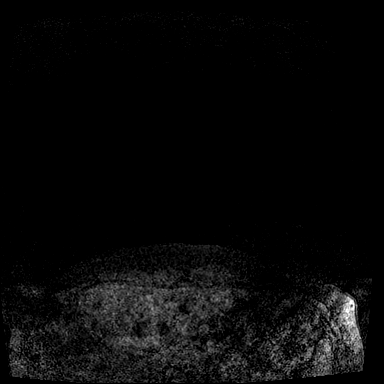
[im 40/160]
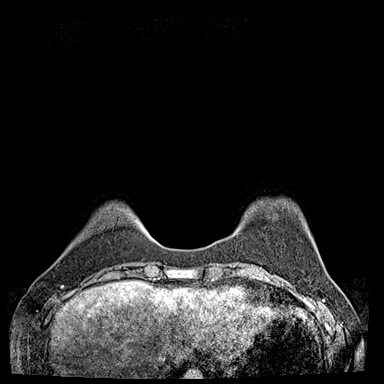
[im 80/160]
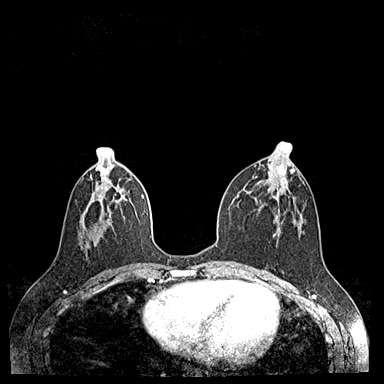
[im 120/160]
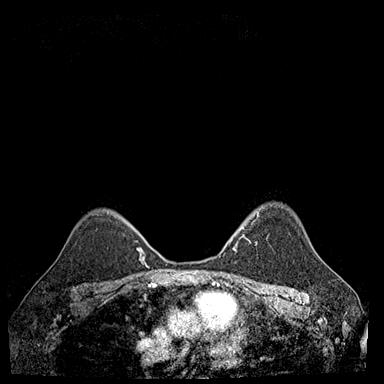
[im 160/160]
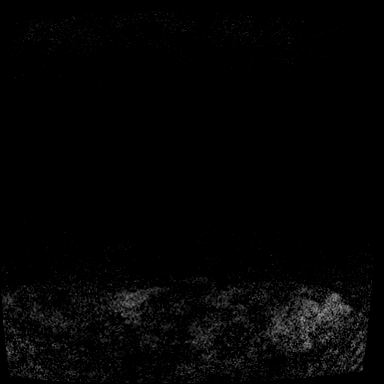

[Series 6: fl3d post-cm 20 · axial · 1.2mm · 0.89mm/px · z∈[-113,+78]mm · 5 of 160 slices shown (2 of 3)]
[im 1/160]
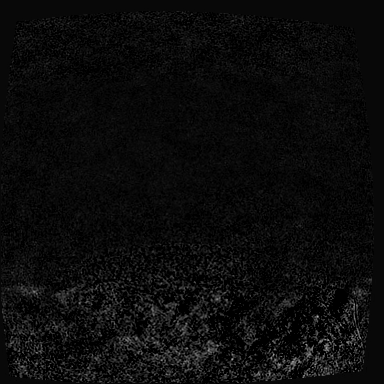
[im 40/160]
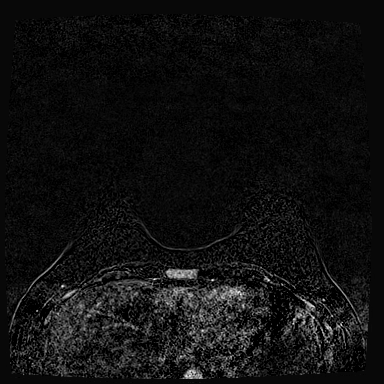
[im 80/160]
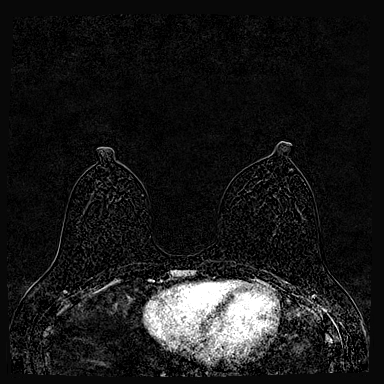
[im 120/160]
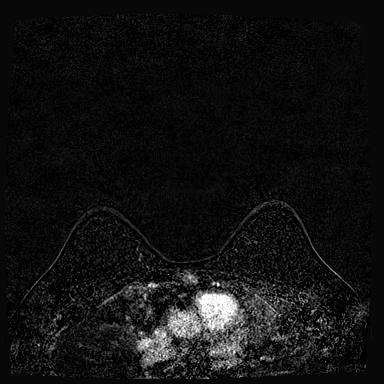
[im 160/160]
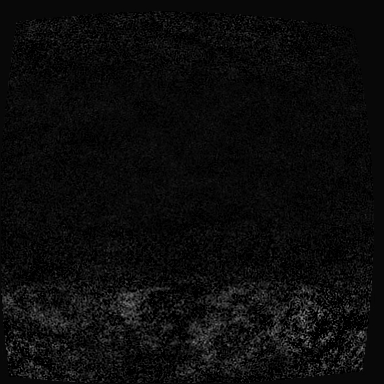

[Series 7: fl3d post-cm 20 · axial · 192.0mm · 0.89mm/px · 1 of 1 slices shown (3 of 3)]
[im 1/1]
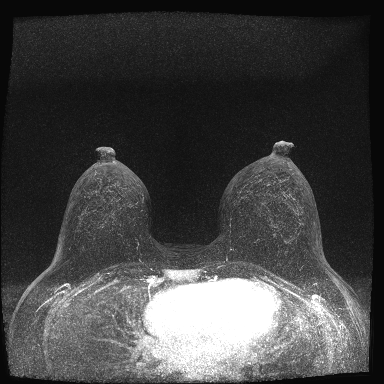

[Series 8: fl3d post-cm 3 · axial · 1.2mm · 0.89mm/px · z∈[-113,+78]mm · 6 of 160 slices shown]
[im 1/160]
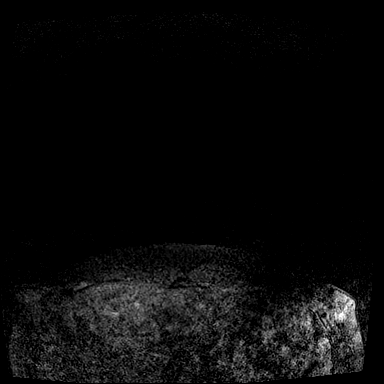
[im 32/160]
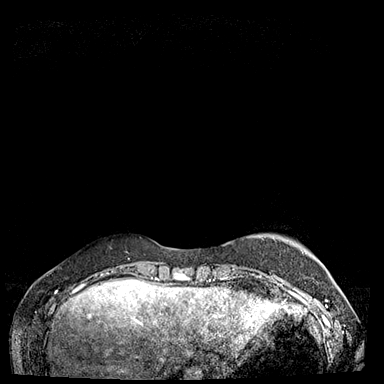
[im 64/160]
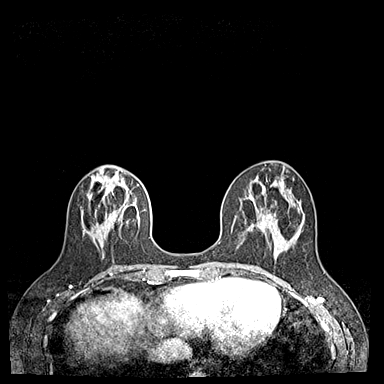
[im 96/160]
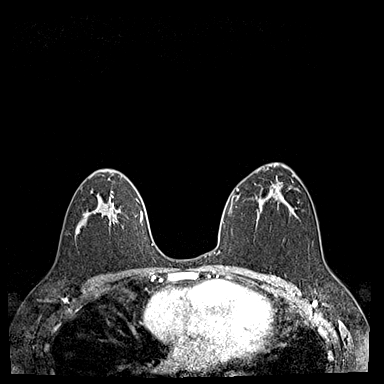
[im 128/160]
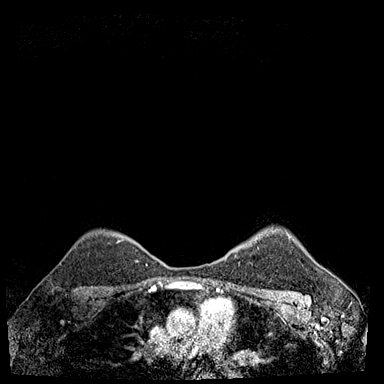
[im 160/160]
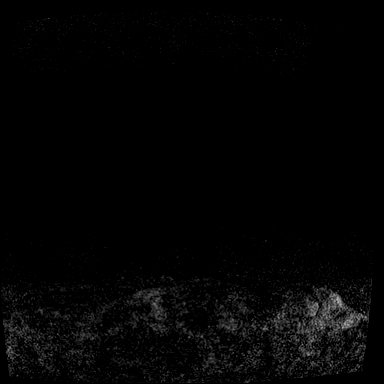

[28 of 48 positions shown; findings below may reference images not displayed]

Three-dimensional MR images were rendered by post-processing of the
original MR data on an independent workstation. The
three-dimensional MR images were interpreted, and findings are
reported in the following complete MRI report for this study. Three
dimensional images were evaluated at the independent interpreting
workstation using the DynaCAD thin client.
FINDINGS: Breast composition: c. Heterogeneous fibroglandular tissue.

Background parenchymal enhancement: Minimal

Right breast: Tissue marker clip is identified within the MEDIAL
aspect of the RIGHT breast at the level of the nipple (image 79 of
series 3). There is no residual late enhancement in this location.
The patchy non mass enhancement previously noted throughout the
central portion of the LEFT breast has resolved. No new or
suspicious findings in the RIGHT breast.

Left breast: No mass or abnormal enhancement. LEFT-sided Port-A-Cath
is present.

Lymph nodes: The largest node in the RIGHT axilla now measures short
axis diameter of 0.9 centimeters. Tissue marker clip placed at the
time of biopsy is not well seen, likely related to technique. No
enlarged RIGHT internal mammary lymph nodes.

LEFT axilla and internal mammary chain are unremarkable.

Ancillary findings:  None.
IMPRESSION: 1. Complete imaging resolution of mass within the 2 o'clock location
of the RIGHT breast and patchy non mass enhancement in the central
aspect of the RIGHT breast since the previous exam.
2. No enlarged RIGHT axillary lymph nodes. Complete imaging
resolution of RIGHT axillary adenopathy.
3. No enlarged RIGHT internal mammary lymph nodes. Complete imaging
resolution of prominent RIGHT internal mammary nodes.
4. No new or suspicious findings.

RECOMMENDATION:
Treatment plan for known RIGHT breast malignancy.

BI-RADS CATEGORY  6: Known biopsy-proven malignancy.

CORRECTED REPORT:
CORRECTED REPORT

Right breast: Tissue marker clip is identified within the MEDIAL
aspect of the RIGHT breast at the level of the nipple (image 79 of
series 3). There is no residual late enhancement in this location.
The patchy non mass enhancement previously noted throughout the
central portion of the RIGHT breast has resolved. No new or
suspicious findings in the RIGHT breast.

*** End of Addendum ***
Three-dimensional MR images were rendered by post-processing of the
original MR data on an independent workstation. The
three-dimensional MR images were interpreted, and findings are
reported in the following complete MRI report for this study. Three
dimensional images were evaluated at the independent interpreting
workstation using the DynaCAD thin client.
FINDINGS: Breast composition: c. Heterogeneous fibroglandular tissue.

Background parenchymal enhancement: Minimal

Right breast: Tissue marker clip is identified within the MEDIAL
aspect of the RIGHT breast at the level of the nipple (image 79 of
series 3). There is no residual late enhancement in this location.
The patchy non mass enhancement previously noted throughout the
central portion of the LEFT breast has resolved. No new or
suspicious findings in the RIGHT breast.

Left breast: No mass or abnormal enhancement. LEFT-sided Port-A-Cath
is present.

Lymph nodes: The largest node in the RIGHT axilla now measures short
axis diameter of 0.9 centimeters. Tissue marker clip placed at the
time of biopsy is not well seen, likely related to technique. No
enlarged RIGHT internal mammary lymph nodes.

LEFT axilla and internal mammary chain are unremarkable.

Ancillary findings:  None.
IMPRESSION: 1. Complete imaging resolution of mass within the 2 o'clock location
of the RIGHT breast and patchy non mass enhancement in the central
aspect of the RIGHT breast since the previous exam.
2. No enlarged RIGHT axillary lymph nodes. Complete imaging
resolution of RIGHT axillary adenopathy.
3. No enlarged RIGHT internal mammary lymph nodes. Complete imaging
resolution of prominent RIGHT internal mammary nodes.
4. No new or suspicious findings.

RECOMMENDATION:
Treatment plan for known RIGHT breast malignancy.

BI-RADS CATEGORY  6: Known biopsy-proven malignancy.

## 2022-05-04 MED ORDER — GADOBUTROL 1 MMOL/ML IV SOLN
7.0000 mL | Freq: Once | INTRAVENOUS | Status: AC | PRN
  Administered 2022-05-04: 7 mL via INTRAVENOUS

## 2022-05-05 ENCOUNTER — Other Ambulatory Visit: Payer: Self-pay | Admitting: Adult Health

## 2022-05-05 ENCOUNTER — Encounter: Payer: Self-pay | Admitting: Hematology and Oncology

## 2022-05-05 ENCOUNTER — Encounter: Payer: Self-pay | Admitting: *Deleted

## 2022-05-05 DIAGNOSIS — C50411 Malignant neoplasm of upper-outer quadrant of right female breast: Secondary | ICD-10-CM

## 2022-05-06 ENCOUNTER — Other Ambulatory Visit: Payer: Self-pay | Admitting: Hematology and Oncology

## 2022-05-06 ENCOUNTER — Telehealth: Payer: Self-pay | Admitting: Plastic Surgery

## 2022-05-06 ENCOUNTER — Inpatient Hospital Stay: Payer: 59 | Admitting: Licensed Clinical Social Worker

## 2022-05-06 ENCOUNTER — Other Ambulatory Visit: Payer: Self-pay

## 2022-05-06 MED ORDER — NYSTATIN 100000 UNIT/ML MT SUSP: 5.0000 mL | Freq: Four times a day (QID) | OROMUCOSAL | 0 refills | Status: DC

## 2022-05-06 NOTE — Telephone Encounter (Signed)
Returned patient's call about quote for abdominoplasty. Advised I would need to check with Dr. Claudia Desanctis regarding how much time he will need so I can draft the quote. She wanted to know about before and after photos. I told her that we do not keep photos for patient viewing but reassured her that Dr. Claudia Desanctis has done man of these surgeries and no one has had issues. Asked patient if she wanted to do the reconstruction surgery but she said she cannot make this decision without seeing photos. She said she could schedule a follow up with [facility]. Sent message to provider regarding scheduling a follow up for her to discuss concerns since we do not have photos. Will call patient back when I hear from Dr. Claudia Desanctis ?

## 2022-05-06 NOTE — Telephone Encounter (Signed)
Pt had a consult w/Dr. Claudia Desanctis in March of this year and she is wanting to see if she can get the quote for that procedure and also for a panniculectomy.  Pt stated that she did not want to have the mastectomy run through insurance only the panniculectomy ran through insurance.  Pt would also like to see pictures of some of Dr. Donell Sievert work on previous pts.  Pt would like to have a call back to give her the quote she is aware that we ask for 14-30 days. ?

## 2022-05-07 ENCOUNTER — Encounter: Payer: Self-pay | Admitting: Licensed Clinical Social Worker

## 2022-05-07 ENCOUNTER — Encounter: Payer: Self-pay | Admitting: Hematology and Oncology

## 2022-05-07 NOTE — Progress Notes (Signed)
[facility] CSW Progress Note ? ?Clinical Designer, multimedia for one month mortgage assistance to Sydney Rios on behalf of patient. ? ? ? ?Sydney Rios Sydney Rios [name], LCSW  ?

## 2022-05-18 ENCOUNTER — Inpatient Hospital Stay: Payer: 59

## 2022-05-18 ENCOUNTER — Encounter: Payer: Self-pay | Admitting: General Practice

## 2022-05-18 ENCOUNTER — Other Ambulatory Visit: Payer: Self-pay | Admitting: Hematology and Oncology

## 2022-05-18 VITALS — BP 103/55 | HR 71 | Temp 98.2°F | Resp 16 | Wt 158.5 lb

## 2022-05-18 DIAGNOSIS — Z171 Estrogen receptor negative status [ER-]: Secondary | ICD-10-CM

## 2022-05-18 DIAGNOSIS — Z95828 Presence of other vascular implants and grafts: Secondary | ICD-10-CM

## 2022-05-18 DIAGNOSIS — Z5112 Encounter for antineoplastic immunotherapy: Secondary | ICD-10-CM | POA: Diagnosis not present

## 2022-05-18 LAB — CBC WITH DIFFERENTIAL (CANCER CENTER ONLY)
Abs Immature Granulocytes: 0 10*3/uL (ref 0.00–0.07)
Basophils Absolute: 0 10*3/uL (ref 0.0–0.1)
Basophils Relative: 1 %
Eosinophils Absolute: 0 10*3/uL (ref 0.0–0.5)
Eosinophils Relative: 0 %
HCT: 30.2 % — ABNORMAL LOW (ref 36.0–46.0)
Hemoglobin: 10 g/dL — ABNORMAL LOW (ref 12.0–15.0)
Immature Granulocytes: 0 %
Lymphocytes Relative: 19 %
Lymphs Abs: 0.6 10*3/uL — ABNORMAL LOW (ref 0.7–4.0)
MCH: 32.9 pg (ref 26.0–34.0)
MCHC: 33.1 g/dL (ref 30.0–36.0)
MCV: 99.3 fL (ref 80.0–100.0)
Monocytes Absolute: 0.4 10*3/uL (ref 0.1–1.0)
Monocytes Relative: 14 %
Neutro Abs: 2.1 10*3/uL (ref 1.7–7.7)
Neutrophils Relative %: 66 %
Platelet Count: 285 10*3/uL (ref 150–400)
RBC: 3.04 MIL/uL — ABNORMAL LOW (ref 3.87–5.11)
RDW: 14.4 % (ref 11.5–15.5)
WBC Count: 3.2 10*3/uL — ABNORMAL LOW (ref 4.0–10.5)
nRBC: 0 % (ref 0.0–0.2)

## 2022-05-18 LAB — CMP (CANCER CENTER ONLY)
ALT: 10 U/L (ref 0–44)
AST: 13 U/L — ABNORMAL LOW (ref 15–41)
Albumin: 4.2 g/dL (ref 3.5–5.0)
Alkaline Phosphatase: 46 U/L (ref 38–126)
Anion gap: 2 — ABNORMAL LOW (ref 5–15)
BUN: 14 mg/dL (ref 6–20)
CO2: 31 mmol/L (ref 22–32)
Calcium: 9.7 mg/dL (ref 8.9–10.3)
Chloride: 109 mmol/L (ref 98–111)
Creatinine: 0.67 mg/dL (ref 0.44–1.00)
GFR, Estimated: >60 mL/min (ref >60)
Glucose, Bld: 92 mg/dL (ref 70–99)
Potassium: 3.9 mmol/L (ref 3.5–5.1)
Sodium: 142 mmol/L (ref 135–145)
Total Bilirubin: 0.3 mg/dL (ref 0.3–1.2)
Total Protein: 6.6 g/dL (ref 6.5–8.1)

## 2022-05-18 LAB — TSH: TSH: 8.444 u[IU]/mL — ABNORMAL HIGH (ref 0.350–4.500)

## 2022-05-18 MED ORDER — SODIUM CHLORIDE 0.9 % IV SOLN: Freq: Once | INTRAVENOUS | Status: AC

## 2022-05-18 MED ORDER — HEPARIN SOD (PORK) LOCK FLUSH 100 UNIT/ML IV SOLN
500.0000 [IU] | Freq: Once | INTRAVENOUS | Status: AC | PRN
  Administered 2022-05-18: 500 [IU]

## 2022-05-18 MED ORDER — SODIUM CHLORIDE 0.9% FLUSH
10.0000 mL | INTRAVENOUS | Status: DC | PRN
  Administered 2022-05-18: 10 mL

## 2022-05-18 MED ORDER — SODIUM CHLORIDE 0.9 % IV SOLN
200.0000 mg | Freq: Once | INTRAVENOUS | Status: AC
  Administered 2022-05-18: 200 mg via INTRAVENOUS
  Filled 2022-05-18: qty 200

## 2022-05-18 MED ORDER — SODIUM CHLORIDE 0.9% FLUSH
10.0000 mL | Freq: Once | INTRAVENOUS | Status: AC
  Administered 2022-05-18: 10 mL

## 2022-05-18 NOTE — Progress Notes (Signed)
[facility] Spiritual Care Note  Connected with Laylia in infusion to follow up on handwritten note of introduction.  We talked about the challenge of discernment and decision fatigue. Sydney Rios notes that having a sense of divine guidance helps her feel more clear in the decisions she makes.  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   Provided empathic listening, normalization of feelings, and pastoral reflection. We plan to follow up at a future treatment, and Sydney Rios has my direct number in case needs arise in the meantime.   [place], [place], [facility] Pager [phone] Voicemail [phone]

## 2022-05-18 NOTE — Patient Instructions (Signed)
[facility]  Discharge Instructions: °Thank you for choosing [facility] to provide your oncology and hematology care.  ° °If you have a lab appointment with the Cancer Center, please go directly to the Cancer Center and check in at the registration area. °  °Wear comfortable clothing and clothing appropriate for easy access to any Portacath or PICC line.  ° °We strive to give you quality time with your provider. You may need to reschedule your appointment if you arrive late (15 or more minutes).  Arriving late affects you and other patients whose appointments are after yours.  Also, if you miss three or more appointments without notifying the office, you may be dismissed from the clinic at the provider’s discretion.    °  °For prescription refill requests, have your pharmacy contact our office and allow 72 hours for refills to be completed.   ° °Today you received the following chemotherapy and/or immunotherapy agent: Pembrolizumab (Keytruda) °  °To help prevent nausea and vomiting after your treatment, we encourage you to take your nausea medication as directed. ° °BELOW ARE SYMPTOMS THAT SHOULD BE REPORTED IMMEDIATELY: °*FEVER GREATER THAN 100.4 F (38 °C) OR HIGHER °*CHILLS OR SWEATING °*NAUSEA AND VOMITING THAT IS NOT CONTROLLED WITH YOUR NAUSEA MEDICATION °*UNUSUAL SHORTNESS OF BREATH °*UNUSUAL BRUISING OR BLEEDING °*URINARY PROBLEMS (pain or burning when urinating, or frequent urination) °*BOWEL PROBLEMS (unusual diarrhea, constipation, pain near the anus) °TENDERNESS IN MOUTH AND THROAT WITH OR WITHOUT PRESENCE OF ULCERS (sore throat, sores in mouth, or a toothache) °UNUSUAL RASH, SWELLING OR PAIN  °UNUSUAL VAGINAL DISCHARGE OR ITCHING  ° °Items with * indicate a potential emergency and should be followed up as soon as possible or go to the Emergency Department if any problems should occur. ° °Please show the CHEMOTHERAPY ALERT CARD or IMMUNOTHERAPY ALERT CARD at  check-in to the Emergency Department and triage nurse. ° °Should you have questions after your visit or need to cancel or reschedule your appointment, please contact [facility]  Dept: [phone]  and follow the prompts.  Office hours are 8:00 a.m. to 4:30 p.m. Monday - Friday. Please note that voicemails left after 4:00 p.m. may not be returned until the following business day.  We are closed weekends and major holidays. You have access to a nurse at all times for urgent questions. Please call the main number to the clinic Dept: [phone] and follow the prompts. ° ° °For any non-urgent questions, you may also contact your provider using MyChart. We now offer e-Visits for anyone 18 and older to request care online for non-urgent symptoms. For details visit [email or web address]. °  °Also download the MyChart app! Go to the app store, search "MyChart", open the app, select [facility], and log in with your MyChart username and password. ° °Due to Covid, a mask is required upon entering the hospital/clinic. If you do not have a mask, one will be given to you upon arrival. For doctor visits, patients may have 1 support person aged 18 or older with them. For treatment visits, patients cannot have anyone with them due to current Covid guidelines and our immunocompromised population.  ° °

## 2022-05-19 ENCOUNTER — Telehealth: Payer: Self-pay | Admitting: *Deleted

## 2022-05-19 ENCOUNTER — Other Ambulatory Visit: Payer: Self-pay | Admitting: Hematology and Oncology

## 2022-05-19 ENCOUNTER — Ambulatory Visit (INDEPENDENT_AMBULATORY_CARE_PROVIDER_SITE_OTHER): Payer: 59 | Admitting: Plastic Surgery

## 2022-05-19 DIAGNOSIS — C50411 Malignant neoplasm of upper-outer quadrant of right female breast: Secondary | ICD-10-CM

## 2022-05-19 DIAGNOSIS — Z171 Estrogen receptor negative status [ER-]: Secondary | ICD-10-CM

## 2022-05-19 LAB — T4: T4, Total: 6.4 ug/dL (ref 4.5–12.0)

## 2022-05-19 MED ORDER — LEVOTHYROXINE SODIUM 150 MCG PO TABS: 150.0000 ug | ORAL_TABLET | Freq: Every day | ORAL | 1 refills | Status: DC

## 2022-05-19 NOTE — Progress Notes (Signed)
   Referring Provider [name], MD [address],  [place] [address]   CC:  Chief Complaint  Patient presents with   Follow-up      Sydney Rios is an 41 y.o. female.  HPI: Patient presents to further discuss breast reconstruction.  She has a right breast cancer and is finishing neoadjuvant chemotherapy.  It sounds like she met recently with Dr. Donne Hazel and was discussing bilateral skin sparing mastectomies due to the close proximity of the tumor to the areola on the right side.  She is anticipating postmastectomy radiation.  We had previously discussed immediate reconstruction with tissue expanders followed by radiation.  We also discussed subsequent abdominoplasty with hernia repair at the time of the expander to implant exchange.  She would like to discuss this a bit further and ask a few more questions.  Review of Systems General: Denies fevers and chills  Physical Exam    05/18/2022    1:11 PM 05/18/2022    1:07 PM 04/29/2022    3:00 PM  Vitals with BMI  Weight  158 lbs 8 oz   BMI  25.05   Systolic 397  673  Diastolic 55  59  Pulse 71  75    General:  No acute distress,  Alert and oriented, Non-Toxic, Normal speech and affect Breast: No obvious scars.  Moderate ptosis.  Good symmetry.  Approximately C cup volume.  Base width 12 cm.  Assessment/Plan Patient is a reasonable candidate for immediate placement of tissue expanders at the time of her skin sparing mastectomy.  We discussed risks include bleeding, infection, damage to surrounding structures need for additional procedures.  We discussed wound healing complications that might result in loss of the implant or expander.  She is fully understanding.  She seems interested in moving forward but would like to think about it a little more.  We will be available to assist for immediate reconstruction if she ends up wanting that.  We did go through again different autologous choices.  We discussed that  her risk is a little bit higher for complications given that postmastectomy radiation is expected.  All of her questions were answered.  Cindra Presume 05/19/2022, 11:40 AM

## 2022-05-19 NOTE — Telephone Encounter (Signed)
Verbal orders received by MD based on recent TSH, pt needing to increase levothyroxine to 150 mcg daily. Prescription sent to pharmacy on file.  RN attempt x1 to contact pt.  No answer, LVM for pt to return call to the office.

## 2022-05-20 ENCOUNTER — Telehealth: Payer: Self-pay | Admitting: Plastic Surgery

## 2022-05-20 NOTE — Telephone Encounter (Signed)
LVM to touch base with patient after her follow up with Dr. Claudia Desanctis yesterday to determine if she still wants to move forward with surgery.Patient wanted to do it as soon as possible. Advised patient to let me know if she is still thinking, wishes to move forward or not at this time so I can be of assistance. Requested call back.

## 2022-05-21 ENCOUNTER — Telehealth: Payer: Self-pay | Admitting: Plastic Surgery

## 2022-05-21 ENCOUNTER — Encounter: Payer: Self-pay | Admitting: *Deleted

## 2022-05-21 NOTE — Telephone Encounter (Signed)
Patient left a voicemail advising that she has decided to go with another office for reconstruction. Provider and Practice Administrator made aware.

## 2022-05-25 ENCOUNTER — Other Ambulatory Visit: Payer: 59

## 2022-05-25 ENCOUNTER — Ambulatory Visit: Payer: 59

## 2022-06-01 ENCOUNTER — Other Ambulatory Visit: Payer: Self-pay

## 2022-06-01 ENCOUNTER — Telehealth: Payer: Self-pay

## 2022-06-01 ENCOUNTER — Inpatient Hospital Stay: Payer: 59 | Attending: Licensed Clinical Social Worker | Admitting: Adult Health

## 2022-06-01 VITALS — BP 117/69 | HR 81 | Temp 98.1°F | Resp 18 | Ht 67.0 in | Wt 158.2 lb

## 2022-06-01 DIAGNOSIS — R21 Rash and other nonspecific skin eruption: Secondary | ICD-10-CM | POA: Diagnosis not present

## 2022-06-01 DIAGNOSIS — C50411 Malignant neoplasm of upper-outer quadrant of right female breast: Secondary | ICD-10-CM | POA: Diagnosis present

## 2022-06-01 DIAGNOSIS — Z171 Estrogen receptor negative status [ER-]: Secondary | ICD-10-CM | POA: Diagnosis not present

## 2022-06-01 DIAGNOSIS — Z5112 Encounter for antineoplastic immunotherapy: Secondary | ICD-10-CM | POA: Diagnosis present

## 2022-06-01 DIAGNOSIS — C773 Secondary and unspecified malignant neoplasm of axilla and upper limb lymph nodes: Secondary | ICD-10-CM | POA: Insufficient documentation

## 2022-06-01 DIAGNOSIS — Z79899 Other long term (current) drug therapy: Secondary | ICD-10-CM | POA: Diagnosis not present

## 2022-06-01 DIAGNOSIS — D6481 Anemia due to antineoplastic chemotherapy: Secondary | ICD-10-CM | POA: Diagnosis not present

## 2022-06-01 DIAGNOSIS — B029 Zoster without complications: Secondary | ICD-10-CM | POA: Diagnosis not present

## 2022-06-01 MED ORDER — VALACYCLOVIR HCL 1 G PO TABS: 1000.0000 mg | ORAL_TABLET | Freq: Three times a day (TID) | ORAL | 0 refills | Status: AC

## 2022-06-01 NOTE — Telephone Encounter (Signed)
Spoke with pt regarding [employer] and image. Pt was offered appt for 1445 with Wilber Bihari, NP for further eval of rash, as shingles outbreak suspected. Pt denies h/o shingles but has h/o HSV-cold sores. Pt states she has an appt at [facility] for a scout placement for surgery this Thursday. Advised pt to have surgeon evaluate rash and treat appropriately to determine if she is a surgery candidate for this week. Pt verbalized understanding and states she will send [place] a mychart message to let us know what surgeon's office dx is.

## 2022-06-01 NOTE — Progress Notes (Signed)
[facility] Follow up:    Sydney Rasmussen, MD [address] [address]   DIAGNOSIS:  Cancer Staging  Malignant neoplasm of upper-outer quadrant of right breast in female, estrogen receptor negative ([place]) Staging form: Breast, AJCC 8th Edition - Clinical stage from 11/11/2021: Stage IIIA (cT3, cN1, cM0, G3, ER+, PR+, HER2-) - Signed by Nicholas Lose, MD on 02/18/2022 Stage prefix: Initial diagnosis Histologic grading system: 3 grade system   SUMMARY OF ONCOLOGIC HISTORY: Oncology History  Malignant neoplasm of upper-outer quadrant of right breast in female, estrogen receptor negative ([place])  11/04/2021 Initial Diagnosis    Patient has been breast-feeding.  Felt a painful right breast mass and an axillary mass.  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 with a Ki-67 of 40%, 1 abnormal lymph node 5.8 cm biopsy: Grade 3 IDC ER 5%, PR 0%, HER2 negative, Ki-67 40%   11/11/2021 Cancer Staging   Staging form: Breast, AJCC 8th Edition - Clinical stage from 11/11/2021: Stage IIIA (cT3, cN1, cM0, G3, ER+, PR+, HER2-) - Signed by Nicholas Lose, MD on 02/18/2022 Stage prefix: Initial diagnosis Histologic grading system: 3 grade system   11/30/2021 - 04/29/2022 Chemotherapy   Patient is on Treatment Plan : BREAST Pembrolizumab + Carboplatin D1 + Paclitaxel D1,8,15 q21d X 4 cycles / Pembrolizumab + AC q21d x 4 cycles     11/30/2021 Genetic Testing   Negative genetic testing on the CancerNext-Expanded+RNA insight panel.  The report date is 11/30/2021  The CancerNext-Expanded gene panel offered by [facility] and includes sequencing and rearrangement analysis for the following 77 genes: AIP, ALK, APC*, ATM*, AXIN2, BAP1, BARD1, BLM, BMPR1A, BRCA1*, BRCA2*, BRIP1*, CDC73, CDH1*, CDK4, CDKN1B, CDKN2A, CHEK2*, CTNNA1, DICER1, FANCC, FH, FLCN, GALNT12, KIF1B, LZTR1, MAX, MEN1, MET, MLH1*, MSH2*, MSH3, MSH6*, MUTYH*, NBN,  NF1*, NF2, NTHL1, PALB2*, PHOX2B, PMS2*, POT1, PRKAR1A, PTCH1, PTEN*, RAD51C*, RAD51D*, RB1, RECQL, RET, SDHA, SDHAF2, SDHB, SDHC, SDHD, SMAD4, SMARCA4, SMARCB1, SMARCE1, STK11, SUFU, TMEM127, TP53*, TSC1, TSC2, VHL and XRCC2 (sequencing and deletion/duplication); EGFR, EGLN1, HOXB13, KIT, MITF, PDGFRA, POLD1, and POLE (sequencing only); EPCAM and GREM1 (deletion/duplication only). DNA and RNA analyses performed for * genes.    05/18/2022 -  Chemotherapy   Patient is on Treatment Plan : BREAST Pembrolizumab (200) q21d x 27 weeks       CURRENT THERAPY: Keytruda  INTERVAL HISTORY: Sydney Rios 41 y.o. female returns for evaluation of her right posterior trunk rash.  This developed in the past couple days she noted a preceding burning and itching pain on her right lower back followed by a rash.  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  She has surgery scheduled later this week.   Patient Active Problem List   Diagnosis Date Noted   Genetic testing 12/01/2021   Port-A-Cath in place 11/30/2021   Malignant neoplasm of upper-outer quadrant of right breast in female, estrogen receptor negative ([place]) 11/11/2021   Abnormal maternal glucose tolerance, antepartum 09/15/2018   S/P cesarean section 10/11/2014   TACHYCARDIA 05/01/2010    has No Known Allergies.  MEDICAL HISTORY: Past Medical History:  Diagnosis Date   Anxiety    Chronic kidney disease    hx kidney stones   Complication of anesthesia    runs heart rate in the 30-40s in PACU   History  of kidney stones    Hx gestational diabetes    Hx of Hashimoto thyroiditis    PONV (postoperative nausea and vomiting)     SURGICAL HISTORY: Past Surgical History:  Procedure Laterality Date   CESAREAN SECTION     CESAREAN SECTION  01/23/2013   Procedure: CESAREAN SECTION;  Surgeon: Luz Lex, MD;  Location: [facility];  Service: Obstetrics;  Laterality:  N/A;   CESAREAN SECTION N/A 10/11/2014   Procedure: CESAREAN SECTION;  Surgeon: Cyril Mourning, MD;  Location: [facility];  Service: Obstetrics;  Laterality: N/A;   CESAREAN SECTION N/A 10/24/2018   Procedure: REPEAT CESAREAN SECTION;  Surgeon: Dian Queen, MD;  Location: [facility];  Service: Obstetrics;  Laterality: N/A;  [place] RNFA   LITHOTRIPSY     PORTACATH PLACEMENT Left 11/25/2021   Procedure: PORT PLACEMENT;  Surgeon: Rolm Bookbinder, MD;  Location: [facility];  Service: General;  Laterality: Left;   WISDOM TOOTH EXTRACTION      SOCIAL HISTORY: Social History   Socioeconomic History   Marital status: Married    Spouse name: Not on file   Number of children: Not on file   Years of education: Not on file   Highest education level: Not on file  Occupational History   Not on file  Tobacco Use   Smoking status: Never   Smokeless tobacco: Never  Vaping Use   Vaping Use: Never used  Substance and Sexual Activity   Alcohol use: No   Drug use: No   Sexual activity: Yes    Birth control/protection: Other-see comments    Comment: Husband has had a vasectomy  Other Topics Concern   Not on file  Social History Narrative   Not on file   Social Determinants of Health   Financial Resource Strain: Low Risk  (10/09/2018)   Overall Financial Resource Strain (CARDIA)    Difficulty of Paying Living Expenses: Not hard at all  Food Insecurity: No Food Insecurity (10/09/2018)   Hunger Vital Sign    Worried About Running Out of Food in the Last Year: Never true    [place] in the Last Year: Never true  Transportation Needs: Unknown (10/09/2018)   PRAPARE - Hydrologist (Medical): No    Lack of Transportation (Non-Medical): Not on file  Physical Activity: Not on file  Stress: No Stress Concern Present (10/09/2018)   [place]    Feeling of Stress : Only a little   Social Connections: Not on file  Intimate Partner Violence: Not At Risk (10/09/2018)   Humiliation, Afraid, Rape, and Kick questionnaire    Fear of Current or Ex-Partner: No    Emotionally Abused: No    Physically Abused: No    Sexually Abused: No    FAMILY HISTORY: Family History  Problem Relation Age of Onset   Hypertension Mother    Diabetes Mother    Hypertension Father    Hypertension Maternal Grandmother    Diabetes Maternal Grandmother    Heart disease Maternal Grandfather    Hypertension Maternal Grandfather    Heart disease Paternal Grandfather    Hypertension Paternal Grandfather    Retinoblastoma Cousin        dx 3 mo - left eye    Review of Systems  Constitutional:  Negative for appetite change, chills, fatigue, fever and unexpected weight change.  HENT:   Negative for hearing loss, lump/mass and trouble swallowing.  Eyes:  Negative for eye problems and icterus.  Respiratory:  Negative for chest tightness, cough and shortness of breath.   Cardiovascular:  Negative for chest pain, leg swelling and palpitations.  Gastrointestinal:  Negative for abdominal distention, abdominal pain, constipation, diarrhea, nausea and vomiting.  Endocrine: Negative for hot flashes.  Genitourinary:  Negative for difficulty urinating.   Musculoskeletal:  Negative for arthralgias.  Skin:  Positive for rash. Negative for itching.  Neurological:  Negative for dizziness, extremity weakness, headaches and numbness.  Hematological:  Negative for adenopathy. Does not bruise/bleed easily.  Psychiatric/Behavioral:  Negative for depression. The patient is not nervous/anxious.       PHYSICAL EXAMINATION  ECOG PERFORMANCE STATUS: 1 - Symptomatic but completely ambulatory  Vitals:   06/01/22 1511  BP: 117/69  Pulse: 81  Resp: 18  Temp: 98.1 F (36.7 C)  SpO2: 100%    Physical Exam Constitutional:      General: She is not in acute distress.    Appearance: Normal appearance. She  is not toxic-appearing.  HENT:     Head: Normocephalic and atraumatic.  Eyes:     General: No scleral icterus. Cardiovascular:     Rate and Rhythm: Normal rate and regular rhythm.     Pulses: Normal pulses.     Heart sounds: Normal heart sounds.  Pulmonary:     Effort: Pulmonary effort is normal.     Breath sounds: Normal breath sounds.  Abdominal:     General: Abdomen is flat. Bowel sounds are normal. There is no distension.     Palpations: Abdomen is soft.     Tenderness: There is no abdominal tenderness.  Musculoskeletal:        General: No swelling.     Cervical back: Neck supple.  Lymphadenopathy:     Cervical: No cervical adenopathy.  Skin:    General: Skin is warm and dry.     Findings: No rash.     Comments: Small vesicular appearing rash on right lower back.  There is 1 small area of these lesions, therefore linear pattern has not yet presented.  Neurological:     General: No focal deficit present.     Mental Status: She is alert.  Psychiatric:        Mood and Affect: Mood normal.        Behavior: Behavior normal.     LABORATORY DATA:  CBC    Component Value Date/Time   WBC 3.2 (L) 05/18/2022 1246   WBC 9.6 10/25/2018 0515   RBC 3.04 (L) 05/18/2022 1246   HGB 10.0 (L) 05/18/2022 1246   HCT 30.2 (L) 05/18/2022 1246   PLT 285 05/18/2022 1246   MCV 99.3 05/18/2022 1246   MCH 32.9 05/18/2022 1246   MCHC 33.1 05/18/2022 1246   RDW 14.4 05/18/2022 1246   LYMPHSABS 0.6 (L) 05/18/2022 1246   MONOABS 0.4 05/18/2022 1246   EOSABS 0.0 05/18/2022 1246   BASOSABS 0.0 05/18/2022 1246    CMP     Component Value Date/Time   NA 142 05/18/2022 1246   K 3.9 05/18/2022 1246   CL 109 05/18/2022 1246   CO2 31 05/18/2022 1246   GLUCOSE 92 05/18/2022 1246   BUN 14 05/18/2022 1246   CREATININE 0.67 05/18/2022 1246   CALCIUM 9.7 05/18/2022 1246   PROT 6.6 05/18/2022 1246   ALBUMIN 4.2 05/18/2022 1246   AST 13 (L) 05/18/2022 1246   ALT 10 05/18/2022 1246   ALKPHOS  46 05/18/2022 1246  BILITOT 0.3 05/18/2022 1246   GFRNONAA >60 05/18/2022 1246   GFRAA >60 10/22/2018 2142         ASSESSMENT and THERAPY PLAN:   Malignant neoplasm of upper-outer quadrant of right breast in female, estrogen receptor negative ([place]) 11/04/2021: Patient has been breast-feeding.  Felt a painful right breast mass and an axillary mass.  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 with a Ki-67 of 40%, 1 abnormal lymph node 5.8 cm biopsy: Grade 3 IDC ER 5%, PR 0%, HER2 negative, Ki-67 40%   CT CAP 11/20/2021: Right breast cancer with axillary lymph nodes, mildly enlarged right retropectoral lymph nodes, right internal mammary lymph nodes Breast MRI 11/17/2021: Known breast cancer 2 cm, right axillary lymph node, non-mass enhancement 7 cm, borderline enlarged right internal mammary lymph nodes   Treatment Plan: based on multidisciplinary tumor board: 1. Neoadjuvant chemotherapy with Taxol weekly 12 with Keytruda and carboplatin every 3 weeks x4 Adriamycin and Cytoxan, Keytruda dose dense 4 followed by  followed by Beryle Flock maintenance for 1 year 2. Followed by mastectomy with targeted axillary dissection 3. Followed by adjuvant radiation therapy  ---------------------------------------------------------------------------------------------------------------------------   Current Treatment: Keytruda  Skin lesions very suspicious for shingles.  Especially with its presentation of sensation associated with that and that it is on 1 side of her body.  Since she started the Valtrex I am hopeful that she will not develop any more lesions.  I sent in Valtrex for her to take at 1 g 3 times a day.  We discussed that these lesions should resolve meaning if they do open up that they crust over.    Sydney Rios verbalized understanding of the above and she will reach out if she has any questions or concerns.  We did briefly review our recommendations around  her surgery based on her history and recent biopsies.   All questions were answered. The patient knows to call the clinic with any problems, questions or concerns. We can certainly see the patient much sooner if necessary.  Total encounter time:20 minutes*in face-to-face visit time, chart review, lab review, care coordination, order entry, and documentation of the encounter time.    Wilber Bihari, NP 06/06/22 12:50 PM Medical Oncology and Hematology [facility] [place], [place] [address] Tel. [phone]    Fax. [phone]  *Total Encounter Time as defined by the Centers for Medicare and Medicaid Services includes, in addition to the face-to-face time of a patient visit (documented in the note above) non-face-to-face time: obtaining and reviewing outside history, ordering and reviewing medications, tests or procedures, care coordination (communications with other health care professionals or caregivers) and documentation in the medical record.

## 2022-06-06 ENCOUNTER — Encounter: Payer: Self-pay | Admitting: Hematology and Oncology

## 2022-06-06 NOTE — Assessment & Plan Note (Signed)
11/04/2021:Patient has been breast-feeding. Felt a painful right breast mass and an axillary mass. 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 Neoadjuvant chemotherapy with Taxol weekly 12 withKeytruda andcarboplatin every 3 weeks x4 Adriamycin and Cytoxan, Keytrudadose dense 4 followed by followed by Beryle Flock maintenance for 1 year 2. Followed bymastectomywith targeted axillary dissection 3. Followed by adjuvant radiation therapy ---------------------------------------------------------------------------------------------------------------------------  Current Treatment: Keytruda  Skin lesions very suspicious for shingles.  Especially with its presentation of sensation associated with that and that it is on 1 side of her body.  Since she started the Valtrex I am hopeful that she will not develop any more lesions.  I sent in Valtrex for her to take at 1 g 3 times a day.  We discussed that these lesions should resolve meaning if they do open up that they crust over.    Sydney Rios verbalized understanding of the above and she will reach out if she has any questions or concerns.  We did briefly review our recommendations around her surgery based on her history and recent biopsies.

## 2022-06-07 ENCOUNTER — Other Ambulatory Visit: Payer: Self-pay | Admitting: Hematology and Oncology

## 2022-06-07 DIAGNOSIS — C50411 Malignant neoplasm of upper-outer quadrant of right female breast: Secondary | ICD-10-CM

## 2022-06-08 ENCOUNTER — Other Ambulatory Visit: Payer: Self-pay

## 2022-06-08 ENCOUNTER — Inpatient Hospital Stay: Payer: 59

## 2022-06-08 ENCOUNTER — Inpatient Hospital Stay (HOSPITAL_BASED_OUTPATIENT_CLINIC_OR_DEPARTMENT_OTHER): Payer: 59 | Admitting: Physician Assistant

## 2022-06-08 ENCOUNTER — Other Ambulatory Visit: Payer: Self-pay | Admitting: Hematology and Oncology

## 2022-06-08 VITALS — BP 109/64 | HR 61 | Temp 98.1°F | Resp 16 | Wt 155.6 lb

## 2022-06-08 DIAGNOSIS — Z171 Estrogen receptor negative status [ER-]: Secondary | ICD-10-CM | POA: Diagnosis not present

## 2022-06-08 DIAGNOSIS — C50411 Malignant neoplasm of upper-outer quadrant of right female breast: Secondary | ICD-10-CM

## 2022-06-08 DIAGNOSIS — B029 Zoster without complications: Secondary | ICD-10-CM

## 2022-06-08 DIAGNOSIS — Z5112 Encounter for antineoplastic immunotherapy: Secondary | ICD-10-CM | POA: Diagnosis not present

## 2022-06-08 LAB — TSH: TSH: 3.699 u[IU]/mL (ref 0.350–4.500)

## 2022-06-08 LAB — CBC WITH DIFFERENTIAL (CANCER CENTER ONLY)
Abs Immature Granulocytes: 0 10*3/uL (ref 0.00–0.07)
Basophils Absolute: 0 10*3/uL (ref 0.0–0.1)
Basophils Relative: 1 %
Eosinophils Absolute: 0 10*3/uL (ref 0.0–0.5)
Eosinophils Relative: 2 %
HCT: 33.5 % — ABNORMAL LOW (ref 36.0–46.0)
Hemoglobin: 10.9 g/dL — ABNORMAL LOW (ref 12.0–15.0)
Immature Granulocytes: 0 %
Lymphocytes Relative: 26 %
Lymphs Abs: 0.7 10*3/uL (ref 0.7–4.0)
MCH: 31.8 pg (ref 26.0–34.0)
MCHC: 32.5 g/dL (ref 30.0–36.0)
MCV: 97.7 fL (ref 80.0–100.0)
Monocytes Absolute: 0.3 10*3/uL (ref 0.1–1.0)
Monocytes Relative: 12 %
Neutro Abs: 1.6 10*3/uL — ABNORMAL LOW (ref 1.7–7.7)
Neutrophils Relative %: 59 %
Platelet Count: 234 10*3/uL (ref 150–400)
RBC: 3.43 MIL/uL — ABNORMAL LOW (ref 3.87–5.11)
RDW: 13.2 % (ref 11.5–15.5)
WBC Count: 2.6 10*3/uL — ABNORMAL LOW (ref 4.0–10.5)
nRBC: 0 % (ref 0.0–0.2)

## 2022-06-08 LAB — CMP (CANCER CENTER ONLY)
ALT: 17 U/L (ref 0–44)
AST: 20 U/L (ref 15–41)
Albumin: 4.3 g/dL (ref 3.5–5.0)
Alkaline Phosphatase: 53 U/L (ref 38–126)
Anion gap: 5 (ref 5–15)
BUN: 12 mg/dL (ref 6–20)
CO2: 28 mmol/L (ref 22–32)
Calcium: 10.1 mg/dL (ref 8.9–10.3)
Chloride: 108 mmol/L (ref 98–111)
Creatinine: 0.67 mg/dL (ref 0.44–1.00)
GFR, Estimated: >60 mL/min (ref >60)
Glucose, Bld: 101 mg/dL — ABNORMAL HIGH (ref 70–99)
Potassium: 3.9 mmol/L (ref 3.5–5.1)
Sodium: 141 mmol/L (ref 135–145)
Total Bilirubin: 0.3 mg/dL (ref 0.3–1.2)
Total Protein: 6.9 g/dL (ref 6.5–8.1)

## 2022-06-08 MED ORDER — SODIUM CHLORIDE 0.9 % IV SOLN
200.0000 mg | Freq: Once | INTRAVENOUS | Status: AC
  Administered 2022-06-08: 200 mg via INTRAVENOUS
  Filled 2022-06-08: qty 200

## 2022-06-08 MED ORDER — SODIUM CHLORIDE 0.9% FLUSH
10.0000 mL | INTRAVENOUS | Status: DC | PRN
  Administered 2022-06-08: 10 mL

## 2022-06-08 MED ORDER — SODIUM CHLORIDE 0.9 % IV SOLN: Freq: Once | INTRAVENOUS | Status: AC

## 2022-06-08 MED ORDER — HEPARIN SOD (PORK) LOCK FLUSH 100 UNIT/ML IV SOLN
500.0000 [IU] | Freq: Once | INTRAVENOUS | Status: AC | PRN
  Administered 2022-06-08: 500 [IU]

## 2022-06-08 NOTE — Patient Instructions (Signed)
[facility] MEDICAL ONCOLOGY  Discharge Instructions: Thank you for choosing [facility] to provide your oncology and hematology care.   If you have a lab appointment with the Cancer Center, please go directly to the Cancer Center and check in at the registration area.   Wear comfortable clothing and clothing appropriate for easy access to any Portacath or PICC line.   We strive to give you quality time with your provider. You may need to reschedule your appointment if you arrive late (15 or more minutes).  Arriving late affects you and other patients whose appointments are after yours.  Also, if you miss three or more appointments without notifying the office, you may be dismissed from the clinic at the provider's discretion.      For prescription refill requests, have your pharmacy contact our office and allow 72 hours for refills to be completed.    Today you received the following chemotherapy and/or immunotherapy agents: Keytruda      To help prevent nausea and vomiting after your treatment, we encourage you to take your nausea medication as directed.  BELOW ARE SYMPTOMS THAT SHOULD BE REPORTED IMMEDIATELY: *FEVER GREATER THAN 100.4 F (38 C) OR HIGHER *CHILLS OR SWEATING *NAUSEA AND VOMITING THAT IS NOT CONTROLLED WITH YOUR NAUSEA MEDICATION *UNUSUAL SHORTNESS OF BREATH *UNUSUAL BRUISING OR BLEEDING *URINARY PROBLEMS (pain or burning when urinating, or frequent urination) *BOWEL PROBLEMS (unusual diarrhea, constipation, pain near the anus) TENDERNESS IN MOUTH AND THROAT WITH OR WITHOUT PRESENCE OF ULCERS (sore throat, sores in mouth, or a toothache) UNUSUAL RASH, SWELLING OR PAIN  UNUSUAL VAGINAL DISCHARGE OR ITCHING   Items with * indicate a potential emergency and should be followed up as soon as possible or go to the Emergency Department if any problems should occur.  Please show the CHEMOTHERAPY ALERT CARD or IMMUNOTHERAPY ALERT CARD at check-in to  the Emergency Department and triage nurse.  Should you have questions after your visit or need to cancel or reschedule your appointment, please contact [facility]  Dept: [phone]  and follow the prompts.  Office hours are 8:00 a.m. to 4:30 p.m. Monday - Friday. Please note that voicemails left after 4:00 p.m. may not be returned until the following business day.  We are closed weekends and major holidays. You have access to a nurse at all times for urgent questions. Please call the main number to the clinic Dept: [phone] and follow the prompts.   For any non-urgent questions, you may also contact your provider using MyChart. We now offer e-Visits for anyone 18 and older to request care online for non-urgent symptoms. For details visit [email or web address].   Also download the MyChart app! Go to the app store, search "MyChart", open the app, select [facility], and log in with your MyChart username and password.  Masks are optional in the cancer centers. If you would like for your care team to wear a mask while they are taking care of you, please let them know. For doctor visits, patients may have with them one support person who is at least 40 years old. At this time, visitors are not allowed in the infusion area. 

## 2022-06-08 NOTE — Progress Notes (Signed)
Symptom Management Consult note [place]    Patient Care Team: [name], MD as PCP - General (Family Medicine) Mauro Kaufmann, RN as Oncology Nurse Navigator Rockwell Germany, RN as Oncology Nurse Navigator Nicholas Lose, MD as Consulting Physician (Hematology and Oncology)    Name of the patient: Sydney Rios  [id number]  16-Jan-1981   Date of visit: 06/08/2022    Chief complaint/ Reason for visit-rash recheck  Oncology History  Malignant neoplasm of upper-outer quadrant of right breast in female, estrogen receptor negative ([place])  11/04/2021 Initial Diagnosis    Patient has been breast-feeding.  Felt a painful right breast mass and an axillary mass.  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 with a Ki-67 of 40%, 1 abnormal lymph node 5.8 cm biopsy: Grade 3 IDC ER 5%, PR 0%, HER2 negative, Ki-67 40%   11/11/2021 Cancer Staging   Staging form: Breast, AJCC 8th Edition - Clinical stage from 11/11/2021: Stage IIIA (cT3, cN1, cM0, G3, ER+, PR+, HER2-) - Signed by Nicholas Lose, MD on 02/18/2022 Stage prefix: Initial diagnosis Histologic grading system: 3 grade system   11/30/2021 - 04/29/2022 Chemotherapy   Patient is on Treatment Plan : BREAST Pembrolizumab + Carboplatin D1 + Paclitaxel D1,8,15 q21d X 4 cycles / Pembrolizumab + AC q21d x 4 cycles     11/30/2021 Genetic Testing   Negative genetic testing on the CancerNext-Expanded+RNA insight panel.  The report date is 11/30/2021  The CancerNext-Expanded gene panel offered by [facility] and includes sequencing and rearrangement analysis for the following 77 genes: AIP, ALK, APC*, ATM*, AXIN2, BAP1, BARD1, BLM, BMPR1A, BRCA1*, BRCA2*, BRIP1*, CDC73, CDH1*, CDK4, CDKN1B, CDKN2A, CHEK2*, CTNNA1, DICER1, FANCC, FH, FLCN, GALNT12, KIF1B, LZTR1, MAX, MEN1, MET, MLH1*, MSH2*, MSH3, MSH6*, MUTYH*, NBN, NF1*, NF2, NTHL1, PALB2*, PHOX2B, PMS2*, POT1, PRKAR1A, PTCH1, PTEN*,  RAD51C*, RAD51D*, RB1, RECQL, RET, SDHA, SDHAF2, SDHB, SDHC, SDHD, SMAD4, SMARCA4, SMARCB1, SMARCE1, STK11, SUFU, TMEM127, TP53*, TSC1, TSC2, VHL and XRCC2 (sequencing and deletion/duplication); EGFR, EGLN1, HOXB13, KIT, MITF, PDGFRA, POLD1, and POLE (sequencing only); EPCAM and GREM1 (deletion/duplication only). DNA and RNA analyses performed for * genes.    05/18/2022 -  Chemotherapy   Patient is on Treatment Plan : BREAST Pembrolizumab (200) q21d x 27 weeks       Current Therapy: Keytruda day 1 cycle 1 on 05/18/2022.  Interval history- Sydney Rios is a 41 y.o. with oncologic history as above presenting to [facility] today with chief complaint of rash x1 week.  Patient seen by another oncology provider on 06/01/22 and was diagnosed with shingles and prescribed Valtrex.  She is here today to have day 1 cycle 2 of Keytruda and wanted to have rash rechecked.  She states while taking the Valtrex the rash has improved.  She only endorsed burning pain the first day rash appeared.  Rash is located on her posterior trunk.  No over-the-counter medications today prior to arrival.  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  She denies any fever, chills, chest pain, shortness of breath, bleeding or bruising.      ROS  All other systems are reviewed and are negative for acute change except as noted in the HPI.    No Known Allergies   Past Medical History:  Diagnosis Date   Anxiety    Chronic kidney disease    hx kidney stones   Complication of anesthesia    runs heart rate  in the 30-40s in PACU   History of kidney stones    Hx gestational diabetes    Hx of Hashimoto thyroiditis    PONV (postoperative nausea and vomiting)      Past Surgical History:  Procedure Laterality Date   CESAREAN SECTION     CESAREAN SECTION  01/23/2013   Procedure: CESAREAN SECTION;  Surgeon: Luz Lex, MD;  Location: [facility];  Service: Obstetrics;  Laterality: N/A;   CESAREAN SECTION N/A 10/11/2014   Procedure: CESAREAN  SECTION;  Surgeon: Cyril Mourning, MD;  Location: [facility];  Service: Obstetrics;  Laterality: N/A;   CESAREAN SECTION N/A 10/24/2018   Procedure: REPEAT CESAREAN SECTION;  Surgeon: Dian Queen, MD;  Location: [facility];  Service: Obstetrics;  Laterality: N/A;  [place] RNFA   LITHOTRIPSY     PORTACATH PLACEMENT Left 11/25/2021   Procedure: PORT PLACEMENT;  Surgeon: Rolm Bookbinder, MD;  Location: [facility];  Service: General;  Laterality: Left;   WISDOM TOOTH EXTRACTION      Social History   Socioeconomic History   Marital status: Married    Spouse name: Not on file   Number of children: Not on file   Years of education: Not on file   Highest education level: Not on file  Occupational History   Not on file  Tobacco Use   Smoking status: Never   Smokeless tobacco: Never  Vaping Use   Vaping Use: Never used  Substance and Sexual Activity   Alcohol use: No   Drug use: No   Sexual activity: Yes    Birth control/protection: Other-see comments    Comment: Husband has had a vasectomy  Other Topics Concern   Not on file  Social History Narrative   Not on file   Social Determinants of Health   Financial Resource Strain: Low Risk  (10/09/2018)   Overall Financial Resource Strain (CARDIA)    Difficulty of Paying Living Expenses: Not hard at all  Food Insecurity: No Food Insecurity (10/09/2018)   Hunger Vital Sign    Worried About Running Out of Food in the Last Year: Never true    [place] in the Last Year: Never true  Transportation Needs: Unknown (10/09/2018)   PRAPARE - Hydrologist (Medical): No    Lack of Transportation (Non-Medical): Not on file  Physical Activity: Not on file  Stress: No Stress Concern Present (10/09/2018)   [place]    Feeling of Stress : Only a little  Social Connections: Not on file  Intimate Partner Violence: Not At Risk (10/09/2018)    Humiliation, Afraid, Rape, and Kick questionnaire    Fear of Current or Ex-Partner: No    Emotionally Abused: No    Physically Abused: No    Sexually Abused: No    Family History  Problem Relation Age of Onset   Hypertension Mother    Diabetes Mother    Hypertension Father    Hypertension Maternal Grandmother    Diabetes Maternal Grandmother    Heart disease Maternal Grandfather    Hypertension Maternal Grandfather    Heart disease Paternal Grandfather    Hypertension Paternal Grandfather    Retinoblastoma Cousin        dx 3 mo - left eye     Current Outpatient Medications:    Ascorbic Acid (VITAMIN C) 1000 MG tablet, Take 1,000 mg by mouth 2 (two) times daily as needed (immune  support (if sick))., Disp: , Rfl:    clindamycin (CLINDAGEL) 1 % gel, Apply topically 2 (two) times daily., Disp: 30 g, Rfl: 0   doxycycline (VIBRA-TABS) 100 MG tablet, TAKE 1 TABLET BY MOUTH EVERY DAY, Disp: 30 tablet, Rfl: 0   IVERMECTIN PO, Take 20 mg by mouth in the morning., Disp: , Rfl:    levothyroxine (SYNTHROID) 150 MCG tablet, Take 1 tablet (150 mcg total) by mouth daily before breakfast., Disp: 30 tablet, Rfl: 1   nystatin (MYCOSTATIN) 100000 UNIT/ML suspension, Take 5 mLs (500,000 Units total) by mouth 4 (four) times daily., Disp: 60 mL, Rfl: 0   valACYclovir (VALTREX) 1000 MG tablet, Take 1 tablet (1,000 mg total) by mouth 3 (three) times daily., Disp: 30 tablet, Rfl: 0 No current facility-administered medications for this visit.  Facility-Administered Medications Ordered in Other Visits:    sodium chloride flush (NS) 0.9 % injection 10 mL, 10 mL, Intracatheter, PRN, Nicholas Lose, MD, 10 mL at 06/08/22 1437  PHYSICAL EXAM: ECOG FS:1 - Symptomatic but completely ambulatory    Vitals:   06/08/22 1206  BP: 109/64  Pulse: 61  Resp: 16  Temp: 98.1 F (36.7 C)  TempSrc: Oral  SpO2: 100%  Weight: 155 lb 9.6 oz (70.6 kg)   Physical Exam Vitals and nursing note reviewed.   Constitutional:      Appearance: She is well-developed. She is not ill-appearing or toxic-appearing.  HENT:     Head: Normocephalic and atraumatic.     Nose: Nose normal.  Eyes:     General: No scleral icterus.       Right eye: No discharge.        Left eye: No discharge.     Conjunctiva/sclera: Conjunctivae normal.  Neck:     Vascular: No JVD.  Cardiovascular:     Rate and Rhythm: Normal rate and regular rhythm.     Pulses: Normal pulses.     Heart sounds: Normal heart sounds.  Pulmonary:     Effort: Pulmonary effort is normal.     Breath sounds: Normal breath sounds.  Abdominal:     General: There is no distension.  Musculoskeletal:        General: Normal range of motion.     Cervical back: Normal range of motion.  Skin:    General: Skin is warm and dry.     Findings: Rash present.     Comments: Macular lesions on right lower back very faint in color.  No vesicular lesions.   Neurological:     Mental Status: She is oriented to person, place, and time.     GCS: GCS eye subscore is 4. GCS verbal subscore is 5. GCS motor subscore is 6.     Comments: Fluent speech, no facial droop.  Psychiatric:        Behavior: Behavior normal.        LABORATORY DATA: I have reviewed the data as listed    Latest Ref Rng & Units 06/08/2022   12:23 PM 05/18/2022   12:46 PM 04/27/2022   10:43 AM  CBC  WBC 4.0 - 10.5 K/uL 2.6  3.2  2.0   Hemoglobin 12.0 - 15.0 g/dL 10.9  10.0  10.1   Hematocrit 36.0 - 46.0 % 33.5  30.2  30.2   Platelets 150 - 400 K/uL 234  285  257         Latest Ref Rng & Units 06/08/2022   12:23 PM 05/18/2022   12:46 PM 04/27/2022  10:43 AM  CMP  Glucose 70 - 99 mg/dL 101  92  103   BUN 6 - 20 mg/dL $Remove'12  14  9   'FDUPDaQ$ Creatinine 0.44 - 1.00 mg/dL 0.67  0.67  0.69   Sodium 135 - 145 mmol/L 141  142  142   Potassium 3.5 - 5.1 mmol/L 3.9  3.9  3.8   Chloride 98 - 111 mmol/L 108  109  109   CO2 22 - 32 mmol/L $RemoveB'28  31  29   'FkTttfdh$ Calcium 8.9 - 10.3 mg/dL 10.1  9.7  9.6    Total Protein 6.5 - 8.1 g/dL 6.9  6.6  6.5   Total Bilirubin 0.3 - 1.2 mg/dL 0.3  0.3  0.3   Alkaline Phos 38 - 126 U/L 53  46  42   AST 15 - 41 U/L $Remo'20  13  12   'hSLOg$ ALT 0 - 44 U/L $Remo'17  10  11        'XIXqw$ RADIOGRAPHIC STUDIES: I have personally reviewed the radiological images as listed and agreed with the findings in the report. No images are attached to the encounter. No results found.   ASSESSMENT & PLAN: Patient is a 42 y.o. female  with oncologic history of malignant neoplasm of upper-outer quadrant of right breast, ER - followed by Dr. Lindi Adie.  I have viewed most recent oncology note and lab work.    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  No indications for additional treatment.  Patient will finish Valtrex as prescribed.  #)Chemotherapy induced anemia and neutropenia-  CBC today with leukopenia 2.6, hemoglobin 10.9 and ANC 1.6 which is consistent with recent labs   #)Triple negative breast cancer- patient will have [place] today and follow-up with oncologist at neck scheduled appointment on 06/30/22. Oncologist will follow up on thyroid labs.  Visit Diagnosis: 1. Malignant neoplasm of upper-outer quadrant of right breast in female, estrogen receptor negative ([place])   2. Herpes zoster without complication      Orders Placed This Encounter  Procedures   TSH    Standing Status:   Future    Number of Occurrences:   1    Standing Expiration Date:   06/08/2023   T4    Standing Status:   Future    Number of Occurrences:   1    Standing Expiration Date:   06/08/2023    All questions were answered. The patient knows to call the clinic with any problems, questions or concerns. No barriers to learning was detected.  I have spent a total of 10 minutes minutes of face-to-face and non-face-to-face time, preparing to see the patient, obtaining and/or reviewing separately obtained history, performing a medically appropriate examination, counseling and educating the  patient, ordering tests,  documenting clinical information in the electronic health record, and care coordination.     Thank you for allowing me to participate in the care of this patient.    Barrie Folk, PA-C Department of Hematology/Oncology [facility] Phone: [phone]  [phone]    06/08/2022 4:57 PM

## 2022-06-08 NOTE — Patient Instructions (Signed)

## 2022-06-09 LAB — T4: T4, Total: 9.4 ug/dL (ref 4.5–12.0)

## 2022-06-10 ENCOUNTER — Other Ambulatory Visit: Payer: Self-pay | Admitting: Hematology and Oncology

## 2022-06-14 ENCOUNTER — Encounter: Payer: Self-pay | Admitting: *Deleted

## 2022-06-15 ENCOUNTER — Ambulatory Visit: Payer: 59

## 2022-06-15 ENCOUNTER — Other Ambulatory Visit: Payer: 59

## 2022-06-17 NOTE — Progress Notes (Signed)
Patient Care Team: [name], MD as PCP - General (Family Medicine) Mauro Kaufmann, RN as Oncology Nurse Navigator Rockwell Germany, RN as Oncology Nurse Navigator Nicholas Lose, MD as Consulting Physician (Hematology and Oncology)  DIAGNOSIS:  Encounter Diagnosis  Name Primary?   Malignant neoplasm of upper-outer quadrant of right breast in female, estrogen receptor negative ([place])     SUMMARY OF ONCOLOGIC HISTORY: Oncology History  Malignant neoplasm of upper-outer quadrant of right breast in female, estrogen receptor negative ([place])  11/04/2021 Initial Diagnosis    Patient has been breast-feeding.  Felt a painful right breast mass and an axillary mass.  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 with a Ki-67 of 40%, 1 abnormal lymph node 5.8 cm biopsy: Grade 3 IDC ER 5%, PR 0%, HER2 negative, Ki-67 40%   11/11/2021 Cancer Staging   Staging form: Breast, AJCC 8th Edition - Clinical stage from 11/11/2021: Stage IIIA (cT3, cN1, cM0, G3, ER+, PR+, HER2-) - Signed by Nicholas Lose, MD on 02/18/2022 Stage prefix: Initial diagnosis Histologic grading system: 3 grade system   11/30/2021 - 04/29/2022 Chemotherapy   Patient is on Treatment Plan : BREAST Pembrolizumab + Carboplatin D1 + Paclitaxel D1,8,15 q21d X 4 cycles / Pembrolizumab + AC q21d x 4 cycles     11/30/2021 Genetic Testing   Negative genetic testing on the CancerNext-Expanded+RNA insight panel.  The report date is 11/30/2021  The CancerNext-Expanded gene panel offered by [facility] and includes sequencing and rearrangement analysis for the following 77 genes: AIP, ALK, APC*, ATM*, AXIN2, BAP1, BARD1, BLM, BMPR1A, BRCA1*, BRCA2*, BRIP1*, CDC73, CDH1*, CDK4, CDKN1B, CDKN2A, CHEK2*, CTNNA1, DICER1, FANCC, FH, FLCN, GALNT12, KIF1B, LZTR1, MAX, MEN1, MET, MLH1*, MSH2*, MSH3, MSH6*, MUTYH*, NBN, NF1*, NF2, NTHL1, PALB2*, PHOX2B, PMS2*, POT1, PRKAR1A, PTCH1, PTEN*, RAD51C*, RAD51D*, RB1,  RECQL, RET, SDHA, SDHAF2, SDHB, SDHC, SDHD, SMAD4, SMARCA4, SMARCB1, SMARCE1, STK11, SUFU, TMEM127, TP53*, TSC1, TSC2, VHL and XRCC2 (sequencing and deletion/duplication); EGFR, EGLN1, HOXB13, KIT, MITF, PDGFRA, POLD1, and POLE (sequencing only); EPCAM and GREM1 (deletion/duplication only). DNA and RNA analyses performed for * genes.    05/18/2022 -  Chemotherapy   Patient is on Treatment Plan : BREAST Pembrolizumab (200) q21d x 27 weeks     06/18/2022 Surgery   Right lumpectomy: No residual invasive or in situ carcinoma.  0/5 lymph nodes     CHIEF COMPLIANT: Follow-up after surgery.  INTERVAL HISTORY: Sydney Rios is a 41 y.o. with above-mentioned history of breast cancer. She presents to the clinic today for a follow-up for Keytruda maintenance and review pathology report. States that surgery went well just having some swelling at the surgical site especially in the axilla. Denies any major side effects from the [facility].  She did have some significant hair loss after the cycle prior on Keytruda.  She would like to see radiation oncology for consultation as well as go through physical therapy.   ALLERGIES:  has No Known Allergies.  MEDICATIONS:  Current Outpatient Medications  Medication Sig Dispense Refill   Ascorbic Acid (VITAMIN C) 1000 MG tablet Take 1,000 mg by mouth 2 (two) times daily as needed (immune support (if sick)).     clindamycin (CLINDAGEL) 1 % gel Apply topically 2 (two) times daily. 30 g 0   doxycycline (VIBRA-TABS) 100 MG tablet TAKE 1 TABLET BY MOUTH EVERY DAY 30 tablet 0   IVERMECTIN PO Take 20 mg by mouth in the morning.  levothyroxine (SYNTHROID) 150 MCG tablet TAKE 1 TABLET BY MOUTH DAILY BEFORE BREAKFAST. 30 tablet 1   nystatin (MYCOSTATIN) 100000 UNIT/ML suspension Take 5 mLs (500,000 Units total) by mouth 4 (four) times daily. 60 mL 0   valACYclovir (VALTREX) 1000 MG tablet Take 1 tablet (1,000 mg total) by mouth 3 (three) times daily. 30 tablet 0   No  current facility-administered medications for this visit.    PHYSICAL EXAMINATION: ECOG PERFORMANCE STATUS: 1 - Symptomatic but completely ambulatory  Vitals:   06/30/22 1242  BP: (!) 105/59  Pulse: 77  Resp: 18  Temp: 97.7 F (36.5 C)  SpO2: 100%   Filed Weights   06/30/22 1242  Weight: 157 lb 12.8 oz (71.6 kg)     LABORATORY DATA:  I have reviewed the data as listed    Latest Ref Rng & Units 06/30/2022   12:27 PM 06/08/2022   12:23 PM 05/18/2022   12:46 PM  CMP  Glucose 70 - 99 mg/dL 163  101  92   BUN 6 - 20 mg/dL $Remove'15  12  14   'ZqknjUQ$ Creatinine 0.44 - 1.00 mg/dL 0.83  0.67  0.67   Sodium 135 - 145 mmol/L 139  141  142   Potassium 3.5 - 5.1 mmol/L 4.3  3.9  3.9   Chloride 98 - 111 mmol/L 107  108  109   CO2 22 - 32 mmol/L $RemoveB'29  28  31   'wnZeunmb$ Calcium 8.9 - 10.3 mg/dL 9.6  10.1  9.7   Total Protein 6.5 - 8.1 g/dL 6.7  6.9  6.6   Total Bilirubin 0.3 - 1.2 mg/dL 0.3  0.3  0.3   Alkaline Phos 38 - 126 U/L 54  53  46   AST 15 - 41 U/L $Remo'13  20  13   'ueiPR$ ALT 0 - 44 U/L $Remo'10  17  10     'WufwP$ Lab Results  Component Value Date   WBC 2.7 (L) 06/30/2022   HGB 10.7 (L) 06/30/2022   HCT 32.1 (L) 06/30/2022   MCV 95.5 06/30/2022   PLT 189 06/30/2022   NEUTROABS 1.7 06/30/2022    ASSESSMENT & PLAN:  Malignant neoplasm of upper-outer quadrant of right breast in female, estrogen receptor negative ([place]) 11/04/2021: Patient has been breast-feeding.  Felt a painful right breast mass and an axillary mass.  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 with a Ki-67 of 40%, 1 abnormal lymph node 5.8 cm biopsy: Grade 3 IDC ER 5%, PR 0%, HER2 negative, Ki-67 40%   CT CAP 11/20/2021: Right breast cancer with axillary lymph nodes, mildly enlarged right retropectoral lymph nodes, right internal mammary lymph nodes Breast MRI 11/17/2021: Known breast cancer 2 cm, right axillary lymph node, non-mass enhancement 7 cm, borderline enlarged right internal mammary lymph nodes    Treatment Plan: based on multidisciplinary tumor board: 1. Neoadjuvant chemotherapy with Taxol weekly 12 with Keytruda and carboplatin every 3 weeks x4 Adriamycin and Cytoxan, Keytruda dose dense 4 followed by  followed by Beryle Flock maintenance for 1 year 2. 06/18/2022:Right lumpectomy (at [facility]): No residual invasive or in situ carcinoma.  0/5 lymph nodes 3. Followed by adjuvant radiation therapy  ---------------------------------------------------------------------------------------------------------------------------  Current Treatment: Keytruda maintenance (Until end of November 2023) Skin lesions very suspicious for shingles.  Pathology counseling: I discussed the final pathology report of the patient provided  a copy of this report. I discussed the margins as well as lymph node surgeries. We  also discussed the final staging along with previously performed ER/PR and HER-2/neu testing.  She had a complete pathologic response to chemotherapy.  Return to clinic every 3 weeks for [place] This Encounter  Procedures   Ambulatory referral to Physical Therapy    Referral Priority:   Routine    Referral Type:   Physical Medicine    Referral Reason:   Specialty Services Required    Requested Specialty:   Physical Therapy    Number of Visits Requested:   1   The patient has a good understanding of the overall plan. she agrees with it. she will call with any problems that may develop before the next visit here. Total time spent: 30 mins including face to face time and time spent for planning, charting and co-ordination of care   Harriette Ohara, MD 06/30/22    I Gardiner Coins am scribing for Dr. Lindi Adie  I have reviewed the above documentation for accuracy and completeness, and I agree with the above.

## 2022-06-21 ENCOUNTER — Encounter: Payer: Self-pay | Admitting: Hematology and Oncology

## 2022-06-23 ENCOUNTER — Encounter: Payer: Self-pay | Admitting: Hematology and Oncology

## 2022-06-24 ENCOUNTER — Telehealth: Payer: Self-pay | Admitting: Hematology and Oncology

## 2022-06-24 ENCOUNTER — Encounter: Payer: Self-pay | Admitting: Physical Therapy

## 2022-06-24 NOTE — Telephone Encounter (Signed)
Changed appointments per patient request. Patient aware.

## 2022-06-24 NOTE — Telephone Encounter (Signed)
R/s per RN request, message has been left with pt

## 2022-06-28 ENCOUNTER — Other Ambulatory Visit: Payer: Self-pay | Admitting: *Deleted

## 2022-06-28 DIAGNOSIS — Z171 Estrogen receptor negative status [ER-]: Secondary | ICD-10-CM

## 2022-06-30 ENCOUNTER — Ambulatory Visit: Payer: 59

## 2022-06-30 ENCOUNTER — Other Ambulatory Visit: Payer: 59

## 2022-06-30 ENCOUNTER — Inpatient Hospital Stay: Payer: 59

## 2022-06-30 ENCOUNTER — Inpatient Hospital Stay: Payer: 59 | Attending: Licensed Clinical Social Worker | Admitting: Hematology and Oncology

## 2022-06-30 ENCOUNTER — Other Ambulatory Visit: Payer: Self-pay

## 2022-06-30 ENCOUNTER — Encounter: Payer: Self-pay | Admitting: *Deleted

## 2022-06-30 DIAGNOSIS — C50411 Malignant neoplasm of upper-outer quadrant of right female breast: Secondary | ICD-10-CM | POA: Insufficient documentation

## 2022-06-30 DIAGNOSIS — Z171 Estrogen receptor negative status [ER-]: Secondary | ICD-10-CM | POA: Diagnosis not present

## 2022-06-30 DIAGNOSIS — Z5112 Encounter for antineoplastic immunotherapy: Secondary | ICD-10-CM | POA: Diagnosis not present

## 2022-06-30 DIAGNOSIS — Z79899 Other long term (current) drug therapy: Secondary | ICD-10-CM | POA: Diagnosis not present

## 2022-06-30 DIAGNOSIS — Z170 Estrogen receptor positive status [ER+]: Secondary | ICD-10-CM | POA: Insufficient documentation

## 2022-06-30 DIAGNOSIS — Z95828 Presence of other vascular implants and grafts: Secondary | ICD-10-CM

## 2022-06-30 LAB — CBC WITH DIFFERENTIAL (CANCER CENTER ONLY)
Abs Immature Granulocytes: 0 10*3/uL (ref 0.00–0.07)
Basophils Absolute: 0 10*3/uL (ref 0.0–0.1)
Basophils Relative: 1 %
Eosinophils Absolute: 0.1 10*3/uL (ref 0.0–0.5)
Eosinophils Relative: 5 %
HCT: 32.1 % — ABNORMAL LOW (ref 36.0–46.0)
Hemoglobin: 10.7 g/dL — ABNORMAL LOW (ref 12.0–15.0)
Immature Granulocytes: 0 %
Lymphocytes Relative: 25 %
Lymphs Abs: 0.7 10*3/uL (ref 0.7–4.0)
MCH: 31.8 pg (ref 26.0–34.0)
MCHC: 33.3 g/dL (ref 30.0–36.0)
MCV: 95.5 fL (ref 80.0–100.0)
Monocytes Absolute: 0.2 10*3/uL (ref 0.1–1.0)
Monocytes Relative: 6 %
Neutro Abs: 1.7 10*3/uL (ref 1.7–7.7)
Neutrophils Relative %: 63 %
Platelet Count: 189 10*3/uL (ref 150–400)
RBC: 3.36 MIL/uL — ABNORMAL LOW (ref 3.87–5.11)
RDW: 13.4 % (ref 11.5–15.5)
WBC Count: 2.7 10*3/uL — ABNORMAL LOW (ref 4.0–10.5)
nRBC: 0 % (ref 0.0–0.2)

## 2022-06-30 LAB — CMP (CANCER CENTER ONLY)
ALT: 10 U/L (ref 0–44)
AST: 13 U/L — ABNORMAL LOW (ref 15–41)
Albumin: 4.1 g/dL (ref 3.5–5.0)
Alkaline Phosphatase: 54 U/L (ref 38–126)
Anion gap: 3 — ABNORMAL LOW (ref 5–15)
BUN: 15 mg/dL (ref 6–20)
CO2: 29 mmol/L (ref 22–32)
Calcium: 9.6 mg/dL (ref 8.9–10.3)
Chloride: 107 mmol/L (ref 98–111)
Creatinine: 0.83 mg/dL (ref 0.44–1.00)
GFR, Estimated: >60 mL/min (ref >60)
Glucose, Bld: 163 mg/dL — ABNORMAL HIGH (ref 70–99)
Potassium: 4.3 mmol/L (ref 3.5–5.1)
Sodium: 139 mmol/L (ref 135–145)
Total Bilirubin: 0.3 mg/dL (ref 0.3–1.2)
Total Protein: 6.7 g/dL (ref 6.5–8.1)

## 2022-06-30 LAB — TSH: TSH: 6.626 u[IU]/mL — ABNORMAL HIGH (ref 0.350–4.500)

## 2022-06-30 MED ORDER — SODIUM CHLORIDE 0.9% FLUSH
10.0000 mL | Freq: Once | INTRAVENOUS | Status: AC
  Administered 2022-06-30: 10 mL

## 2022-06-30 MED ORDER — HEPARIN SOD (PORK) LOCK FLUSH 100 UNIT/ML IV SOLN
500.0000 [IU] | Freq: Once | INTRAVENOUS | Status: AC
  Administered 2022-06-30: 500 [IU]

## 2022-06-30 NOTE — Assessment & Plan Note (Addendum)
11/04/2021:Patient has been breast-feeding. Felt a painful right breast mass and an axillary mass. 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 Neoadjuvant chemotherapy with Taxol weekly 12 withKeytruda andcarboplatin every 3 weeks x4 Adriamycin and Cytoxan, Keytrudadose dense 4 followed by followed by Beryle Flock maintenance for 1 year 2. 06/18/2022:Right lumpectomy (at [facility]): No residual invasive or in situ carcinoma.  0/5 lymph nodes 3. Followed by adjuvant radiation therapy --------------------------------------------------------------------------------------------------------------------------- Current Treatment: Keytruda maintenance (Until end of November 2023) Skin lesions very suspicious for shingles.  Pathology counseling: I discussed the final pathology report of the patient provided  a copy of this report. I discussed the margins as well as lymph node surgeries. We also discussed the final staging along with previously performed ER/PR and HER-2/neu testing.  She had a complete pathologic response to chemotherapy.  Return to clinic every 3 weeks for [facility]

## 2022-07-01 ENCOUNTER — Inpatient Hospital Stay: Payer: 59

## 2022-07-01 ENCOUNTER — Telehealth: Payer: Self-pay | Admitting: Hematology and Oncology

## 2022-07-01 ENCOUNTER — Other Ambulatory Visit: Payer: 59

## 2022-07-01 VITALS — BP 105/42 | HR 60 | Temp 98.4°F | Resp 15

## 2022-07-01 DIAGNOSIS — C50411 Malignant neoplasm of upper-outer quadrant of right female breast: Secondary | ICD-10-CM

## 2022-07-01 DIAGNOSIS — Z5112 Encounter for antineoplastic immunotherapy: Secondary | ICD-10-CM | POA: Diagnosis not present

## 2022-07-01 LAB — T4: T4, Total: 5.7 ug/dL (ref 4.5–12.0)

## 2022-07-01 MED ORDER — SODIUM CHLORIDE 0.9% FLUSH
10.0000 mL | INTRAVENOUS | Status: DC | PRN
  Administered 2022-07-01: 10 mL

## 2022-07-01 MED ORDER — SODIUM CHLORIDE 0.9 % IV SOLN: Freq: Once | INTRAVENOUS | Status: AC

## 2022-07-01 MED ORDER — SODIUM CHLORIDE 0.9 % IV SOLN
200.0000 mg | Freq: Once | INTRAVENOUS | Status: AC
  Administered 2022-07-01: 200 mg via INTRAVENOUS
  Filled 2022-07-01: qty 200

## 2022-07-01 MED ORDER — HEPARIN SOD (PORK) LOCK FLUSH 100 UNIT/ML IV SOLN
500.0000 [IU] | Freq: Once | INTRAVENOUS | Status: AC | PRN
  Administered 2022-07-01: 500 [IU]

## 2022-07-01 NOTE — Telephone Encounter (Signed)
Scheduled appointment per 7/5 los. Left voicemail for patient.

## 2022-07-01 NOTE — Patient Instructions (Signed)
[facility]ICAL [facility]COL[facility]  Discharge Instructions: Thank you for choosing [facility] to provide your oncology and hematology care.   If you have a lab appointment with the Cancer Center, please go directly to the Cancer Center and check in at the registration area.   Wear comfortable clothing and clothing appropriate for easy access to any Portacath or PICC line.   We strive to give you quality time with your provider. You may need to reschedule your appointment if you arrive late (15 or more minutes).  Arriving late affects you and other patients whose appointments are after yours.  Also, if you miss three or more appointments without notifying the office, you may be dismissed from the clinic at the provider's discretion.      For prescription refill requests, have your pharmacy contact our office and allow 72 hours for refills to be completed.    Today you received the following chemotherapy and/or immunotherapy agents: Keytruda      To help prevent nausea and vomiting after your treatment, we encourage you to take your nausea medication as directed.  BELOW ARE SYMPTOMS THAT SHOULD BE REPORTED IMMEDIATELY: *FEVER GREATER THAN 100.4 F (38 C) OR HIGHER *CHILLS OR SWEATING *NAUSEA AND VOMITING THAT IS NOT CONTROLLED WITH YOUR NAUSEA MEDICATION *UNUSUAL SHORTNESS OF BREATH *UNUSUAL BRUISING OR BLEEDING *URINARY PROBLEMS (pain or burning when urinating, or frequent urination) *BOWEL PROBLEMS (unusual diarrhea, constipation, pain near the anus) TENDERNESS IN MOUTH AND THROAT WITH OR WITHOUT PRESENCE OF ULCERS (sore throat, sores in mouth, or a toothache) UNUSUAL RASH, SWELLING OR PAIN  UNUSUAL VAGINAL DISCHARGE OR ITCHING   Items with * indicate a potential emergency and should be followed up as soon as possible or go to the Emergency Department if any problems should occur.  Please show the CHEMOTHERAPY ALERT CARD or IMMUNOTHERAPY ALERT CARD at check-in to  the Emergency Department and triage nurse.  Should you have questions after your visit or need to cancel or reschedule your appointment, please contact [facility]  Dept: [phone]  and follow the prompts.  Office hours are 8:00 a.m. to 4:30 p.m. Monday - Friday. Please note that voicemails left after 4:00 p.m. may not be returned until the following business day.  We are closed weekends and major holidays. You have access to a nurse at all times for urgent questions. Please call the main number to the clinic Dept: [phone] and follow the prompts.   For any non-urgent questions, you may also contact your provider using MyChart. We now offer e-Visits for anyone 18 and older to request care online for non-urgent symptoms. For details visit [email or web address].   Also download the MyChart app! Go to the app store, search "MyChart", open the app, select [facility], and log in with your MyChart username and password.  Masks are optional in the cancer centers. If you would like for your care team to wear a mask while they are taking care of you, please let them know. For doctor visits, patients may have with them one support person who is at least 40 years old. At this time, visitors are not allowed in the infusion area. 

## 2022-07-05 NOTE — Progress Notes (Signed)
Radiation Oncology         [phone] ________________________________  Name: Sydney Rios        MRN: [id number]  Date of Service: 07/07/2022 DOB: [birth year]  VZ:CHYIFOY, Maebelle Munroe, MD  Nicholas Lose, MD     REFERRING PHYSICIAN: Nicholas Lose, MD   DIAGNOSIS: The encounter diagnosis was Malignant neoplasm of upper-outer quadrant of right breast in female, estrogen receptor negative ([place]).   HISTORY OF PRESENT ILLNESS: Sydney Rios is a 41 y.o. female seen at the request of Dr. Lindi Adie for a diagnosis of right node positive breast cancer. The patient originally presented in November 2022 after noting a painful right breast mass in the setting of breast-feeding.  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  She had 1 abnormal appearing lymph node measuring up to 5.8 cm and this was also consistent with invasive ductal carcinoma grade 3 though this was ER weakly positive at 5% PR and HER2 negative with a Ki-67 of 40.  She was staged as IIIA and began neoadjuvant pembrolizumab carboplatin and paclitaxel on 11/30/2022.  Genetic testing in December 2022 was negative.  She continued chemotherapy until 04/29/2022 and is continued on pembrolizumab as single agent.  Posttreatment imaging with MRI on 05/04/2022 showed complete resolution of the mass in the right breast and complete resolution of her right axillary adenopathy.  She was initially planning on mastectomies but decided that she would rather conserve her breast and underwent lumpectomy on 06/18/2022 with Dr. Morley Kos at [facility].  No evidence of residual carcinoma was present.  5 sentinel lymph nodes were examined and were all negative for disease as well.  She is seen to discuss adjuvant radiotherapy.    PREVIOUS RADIATION THERAPY: No   PAST MEDICAL HISTORY:  Past Medical History:  Diagnosis  Date   Anxiety    Chronic kidney disease    hx kidney stones   Complication of anesthesia    runs heart rate in the 30-40s in PACU   History of kidney stones    Hx gestational diabetes    Hx of Hashimoto thyroiditis    PONV (postoperative nausea and vomiting)        PAST SURGICAL HISTORY: Past Surgical History:  Procedure Laterality Date   CESAREAN SECTION     CESAREAN SECTION  01/23/2013   Procedure: CESAREAN SECTION;  Surgeon: Luz Lex, MD;  Location: [facility];  Service: Obstetrics;  Laterality: N/A;   CESAREAN SECTION N/A 10/11/2014   Procedure: CESAREAN SECTION;  Surgeon: Cyril Mourning, MD;  Location: [facility];  Service: Obstetrics;  Laterality: N/A;   CESAREAN SECTION N/A 10/24/2018   Procedure: REPEAT CESAREAN SECTION;  Surgeon: Dian Queen, MD;  Location: [place];  Service: Obstetrics;  Laterality: N/A;  Tracey RNFA   LITHOTRIPSY     PORTACATH PLACEMENT Left 11/25/2021   Procedure: PORT PLACEMENT;  Surgeon: Rolm Bookbinder, MD;  Location: [place];  Service: General;  Laterality: Left;   WISDOM TOOTH EXTRACTION       FAMILY HISTORY:  Family History  Problem Relation Age of Onset   Hypertension Mother    Diabetes Mother    Hypertension Father    Hypertension Maternal Grandmother    Diabetes Maternal Grandmother    Heart disease Maternal Grandfather    Hypertension Maternal Grandfather    Heart  disease Paternal Grandfather    Hypertension Paternal Grandfather    Retinoblastoma Cousin        dx 3 mo - left eye     SOCIAL HISTORY:  reports that she has never smoked. She has never used smokeless tobacco. She reports that she does not drink alcohol and does not use drugs.  The patient is married and lives in [place].  She is married and homeschool's her children.  She enjoys travelling though this has not happened as much as she would like as of late due to her cancer treatments and appointments.    ALLERGIES: Patient has no known  allergies.   MEDICATIONS:  Current Outpatient Medications  Medication Sig Dispense Refill   Ascorbic Acid (VITAMIN C) 1000 MG tablet Take 1,000 mg by mouth 2 (two) times daily as needed (immune support (if sick)).     clindamycin (CLINDAGEL) 1 % gel Apply topically 2 (two) times daily. 30 g 0   doxycycline (VIBRA-TABS) 100 MG tablet TAKE 1 TABLET BY MOUTH EVERY DAY 30 tablet 0   IVERMECTIN PO Take 20 mg by mouth in the morning.     levothyroxine (SYNTHROID) 150 MCG tablet TAKE 1 TABLET BY MOUTH DAILY BEFORE BREAKFAST. 30 tablet 1   nystatin (MYCOSTATIN) 100000 UNIT/ML suspension Take 5 mLs (500,000 Units total) by mouth 4 (four) times daily. 60 mL 0   valACYclovir (VALTREX) 1000 MG tablet Take 1 tablet (1,000 mg total) by mouth 3 (three) times daily. 30 tablet 0   No current facility-administered medications for this visit.     REVIEW OF SYSTEMS: On review of systems, the patient reports that she is doing well since surgery. She has had some soreness in her breast and some fullness along the lateral chest wall. She did not receive a compression bra from her surgery. She's had some tightness along her right axilla since surgery. No other complaints are noted.      PHYSICAL EXAM:  Wt Readings from Last 3 Encounters:  06/30/22 157 lb 12.8 oz (71.6 kg)  06/08/22 155 lb 9.6 oz (70.6 kg)  06/01/22 158 lb 3.2 oz (71.8 kg)   Temp Readings from Last 3 Encounters:  07/01/22 98.4 F (36.9 C) (Oral)  06/30/22 97.7 F (36.5 C) (Temporal)  06/08/22 98.1 F (36.7 C) (Oral)   BP Readings from Last 3 Encounters:  07/01/22 (!) 105/42  06/30/22 (!) 105/59  06/08/22 109/64   Pulse Readings from Last 3 Encounters:  07/01/22 60  06/30/22 77  06/08/22 61    In general this is a well appearing Caucasian female in no acute distress. She's alert and oriented x4 and appropriate throughout the examination. Cardiopulmonary assessment is negative for acute distress and she exhibits normal effort.   Her right breast reveals a well-healed surgical incision site as well as in the right axilla.  Neither site reveal erythema, separation, or bleeding. There is a bit of fullness in the right chest wall laterally below the axilla without fluctuance, and some tightness along that area extending into the right biceps region.     ECOG = 1  0 - Asymptomatic (Fully active, able to carry on all predisease activities without restriction)  1 - Symptomatic but completely ambulatory (Restricted in physically strenuous activity but ambulatory and able to carry out work of a light or sedentary nature. For example, light housework, office work)  2 - Symptomatic, <50% in bed during the day (Ambulatory and capable of all self care but unable to carry  out any work activities. Up and about more than 50% of waking hours)  3 - Symptomatic, >50% in bed, but not bedbound (Capable of only limited self-care, confined to bed or chair 50% or more of waking hours)  4 - Bedbound (Completely disabled. Cannot carry on any self-care. Totally confined to bed or chair)  5 - Death   Eustace Pen MM, Creech RH, Tormey DC, et al. [phone]). "Toxicity and response criteria of the [facility]". [place] Oncol. 5 (6): 649-55    LABORATORY DATA:  Lab Results  Component Value Date   WBC 2.7 (L) 06/30/2022   HGB 10.7 (L) 06/30/2022   HCT 32.1 (L) 06/30/2022   MCV 95.5 06/30/2022   PLT 189 06/30/2022   Lab Results  Component Value Date   NA 139 06/30/2022   K 4.3 06/30/2022   CL 107 06/30/2022   CO2 29 06/30/2022   Lab Results  Component Value Date   ALT 10 06/30/2022   AST 13 (L) 06/30/2022   ALKPHOS 54 06/30/2022   BILITOT 0.3 06/30/2022      RADIOGRAPHY: No results found.     IMPRESSION/PLAN: 1. 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 Dr. Lisbeth Renshaw discusses the final and preoperative pathology findings and reviews the nature of node  positive breast disease.  She has completed systemic chemotherapy and continues with immunotherapy and consolidation.  Dr. Lisbeth Renshaw discusses the rationale for adjuvant external beam radiotherapy to the right breast and regional lymph nodes to reduce risks of local recurrence followed by antiestrogen therapy. We discussed the risks, benefits, short, and long term effects of radiotherapy, as well as the curative intent, and the patient is interested in proceeding. Dr. Lisbeth Renshaw discusses the delivery and logistics of radiotherapy and anticipates a course of 6 1/2 weeks of radiotherapy to the right breast and regional lymph nodes. Written consent is obtained and placed in the chart, a copy was provided to the patient. The patient will be contacted to coordinate treatment planning by our simulation department.  2. Contraceptive counseling. Her husband has undergone vasectomy and the patient is aware of the need to avoid pregnacy during treatment. No testing is needed given her partner's vasectomy. 3. Possible cording and chest wall fullness possibly consistent with early lymphedema. I discussed the rationale for seeing PT. She has an appt, but I'll message her PT team to see if they would see her sooner. In the meantime I encouraged her to try to find a compressive style sports bra or to go to second to nature store in [place] to be fitted for one.    In a visit lasting 60 minutes, greater than 50% of the time was spent face to face reviewing her case, as well as in preparation of, discussing, and coordinating the patient's care.  The above documentation reflects my direct findings during this shared patient visit. Please see the separate note by Dr. Lisbeth Renshaw on this date for the remainder of the patient's plan of care.    Carola Rhine, [facility]    **Disclaimer: This note was dictated with voice recognition software. Similar sounding words can inadvertently be transcribed and this note may contain transcription  errors which may not have been corrected upon publication of note.**

## 2022-07-06 ENCOUNTER — Encounter: Payer: Self-pay | Admitting: *Deleted

## 2022-07-06 NOTE — Progress Notes (Signed)
New Breast Cancer Diagnosis: Right Breast UOQ  Did patient present with symptoms (if so, please note symptoms) or screening mammography?:Palpable mass noted in the right breast November 2022.   Posttreatment imaging with MRI on 05/04/2022: Complete resolution of the mass in the right breast and complete resolution of her right axillary adenopathy.     Location and Extent of disease :right breast. Located at 2 o'clock position, measured  2.6 cm in greatest dimension. Adenopathy yes, one abnormal appearing lymph node measuring up to 5.8 cm.  Histology per Pathology Report: Lumpectomy 06/18/2022 grade 3, Invasive Ductal Carcinoma  Receptor Status: ER(negative), PR (negative), Her2-neu (negative), Ki-(40%)  Surgeon and surgical plan, if any:  Dr. Morley Kos [facility]) -Lumpectomy 06/18/2022 -No evidence of residual carcinoma.  5 sentinel lymph nodes were examined and were negative for disease.   Medical oncologist, treatment if any:   Dr. Lindi Adie -She was staged as IIIA and began neoadjuvant pembrolizumab carboplatin and paclitaxel on 11/30/2021- 04/29/2022, she continues on pembrolizumab as single agent.   Family History of Breast/Ovarian/Prostate Cancer: No  Lymphedema issues, if any: Has some swelling under her arm.     Pain issues, if any: Denies.   SAFETY ISSUES: Prior radiation? No Pacemaker/ICD? No Possible current pregnancy? Having Cycles, last one before her final chemo treatment.  Husband has had vasectomy. Is the patient on methotrexate? no  Current Complaints / other details:   Genetic Testing 11/2021- Negative

## 2022-07-07 ENCOUNTER — Other Ambulatory Visit: Payer: Self-pay

## 2022-07-07 ENCOUNTER — Ambulatory Visit
Admission: RE | Admit: 2022-07-07 | Discharge: 2022-07-07 | Disposition: A | Discharge status: Home / Self Care | Payer: 59 | Source: Ambulatory Visit | Attending: Radiation Oncology | Admitting: Radiation Oncology

## 2022-07-07 ENCOUNTER — Encounter: Payer: Self-pay | Admitting: Hematology and Oncology

## 2022-07-07 ENCOUNTER — Encounter: Payer: Self-pay | Admitting: Radiation Oncology

## 2022-07-07 VITALS — BP 102/48 | HR 68 | Temp 98.1°F | Ht 67.0 in | Wt 157.2 lb

## 2022-07-07 DIAGNOSIS — N189 Chronic kidney disease, unspecified: Secondary | ICD-10-CM | POA: Insufficient documentation

## 2022-07-07 DIAGNOSIS — C50411 Malignant neoplasm of upper-outer quadrant of right female breast: Secondary | ICD-10-CM | POA: Diagnosis present

## 2022-07-07 DIAGNOSIS — Z79624 Long term (current) use of inhibitors of nucleotide synthesis: Secondary | ICD-10-CM | POA: Insufficient documentation

## 2022-07-07 DIAGNOSIS — Z87442 Personal history of urinary calculi: Secondary | ICD-10-CM | POA: Insufficient documentation

## 2022-07-07 DIAGNOSIS — Z79890 Hormone replacement therapy (postmenopausal): Secondary | ICD-10-CM | POA: Diagnosis not present

## 2022-07-07 DIAGNOSIS — Z171 Estrogen receptor negative status [ER-]: Secondary | ICD-10-CM

## 2022-07-07 DIAGNOSIS — Z79899 Other long term (current) drug therapy: Secondary | ICD-10-CM | POA: Diagnosis not present

## 2022-07-08 ENCOUNTER — Ambulatory Visit: Payer: 59

## 2022-07-08 ENCOUNTER — Other Ambulatory Visit: Payer: 59

## 2022-07-10 ENCOUNTER — Other Ambulatory Visit: Payer: Self-pay | Admitting: Hematology and Oncology

## 2022-07-12 ENCOUNTER — Encounter: Payer: Self-pay | Admitting: Hematology and Oncology

## 2022-07-12 ENCOUNTER — Telehealth: Payer: Self-pay

## 2022-07-12 NOTE — Telephone Encounter (Signed)
Pt called reporting she had been feeling recent rib tenderness. Encouraged her that it is common to see this with pts after breast surgery as their posture can change temporarily with increased rounded shoulders causing increased tightness to pectoralis and intercostal muscles. This is probably what she is experiencing and less likely to be recurrence which is what she was nervous of. We were able to get her physical therapy evaluation moved up to tomorrow so the PT can further assess her then.

## 2022-07-13 ENCOUNTER — Encounter: Payer: Self-pay | Admitting: Physical Therapy

## 2022-07-13 ENCOUNTER — Ambulatory Visit: Payer: 59 | Attending: Hematology and Oncology | Admitting: Physical Therapy

## 2022-07-13 ENCOUNTER — Ambulatory Visit: Payer: 59 | Admitting: Physical Therapy

## 2022-07-13 ENCOUNTER — Other Ambulatory Visit: Payer: Self-pay

## 2022-07-13 DIAGNOSIS — R293 Abnormal posture: Secondary | ICD-10-CM | POA: Diagnosis not present

## 2022-07-13 DIAGNOSIS — R600 Localized edema: Secondary | ICD-10-CM | POA: Insufficient documentation

## 2022-07-13 DIAGNOSIS — Z483 Aftercare following surgery for neoplasm: Secondary | ICD-10-CM | POA: Insufficient documentation

## 2022-07-13 DIAGNOSIS — M25611 Stiffness of right shoulder, not elsewhere classified: Secondary | ICD-10-CM | POA: Diagnosis not present

## 2022-07-13 DIAGNOSIS — C50411 Malignant neoplasm of upper-outer quadrant of right female breast: Secondary | ICD-10-CM | POA: Diagnosis not present

## 2022-07-13 DIAGNOSIS — Z171 Estrogen receptor negative status [ER-]: Secondary | ICD-10-CM | POA: Insufficient documentation

## 2022-07-13 NOTE — Therapy (Signed)
OUTPATIENT PHYSICAL THERAPY ONCOLOGY EVALUATION  Patient Name: Sydney Rios MRN: [id number] DOB:07-13-1981, 41 y.o., female Today's Date: 07/13/2022   PT End of Session - 07/13/22 1448     Visit Number 1    Number of Visits 9    Date for PT Re-Evaluation 08/10/22    PT Start Time 1408    PT Stop Time 6389    PT Time Calculation (min) 38 min    Activity Tolerance Patient tolerated treatment well    Behavior During Therapy WFL for tasks assessed/performed             Past Medical History:  Diagnosis Date   Anxiety    Chronic kidney disease    hx kidney stones   Complication of anesthesia    runs heart rate in the 30-40s in PACU   History of kidney stones    Hx gestational diabetes    Hx of Hashimoto thyroiditis    PONV (postoperative nausea and vomiting)    Past Surgical History:  Procedure Laterality Date   CESAREAN SECTION     CESAREAN SECTION  01/23/2013   Procedure: CESAREAN SECTION;  Surgeon: Luz Lex, MD;  Location: [facility];  Service: Obstetrics;  Laterality: N/A;   CESAREAN SECTION N/A 10/11/2014   Procedure: CESAREAN SECTION;  Surgeon: Cyril Mourning, MD;  Location: [facility];  Service: Obstetrics;  Laterality: N/A;   CESAREAN SECTION N/A 10/24/2018   Procedure: REPEAT CESAREAN SECTION;  Surgeon: Dian Queen, MD;  Location: [facility];  Service: Obstetrics;  Laterality: N/A;  Tracey RNFA   LITHOTRIPSY     PORTACATH PLACEMENT Left 11/25/2021   Procedure: PORT PLACEMENT;  Surgeon: Rolm Bookbinder, MD;  Location: [facility];  Service: General;  Laterality: Left;   WISDOM TOOTH EXTRACTION     Patient Active Problem List   Diagnosis Date Noted   Genetic testing 12/01/2021   Port-A-Cath in place 11/30/2021   Malignant neoplasm of upper-outer quadrant of right breast in female, estrogen receptor negative ([place]) 11/11/2021   Abnormal maternal glucose tolerance, antepartum 09/15/2018   S/P cesarean section 10/11/2014   TACHYCARDIA 05/01/2010     PCP: Horald Pollen, MD  REFERRING PROVIDER: Nicholas Lose, MD  REFERRING DIAG: C50.411,Z17.1 (ICD-10-CM) - Malignant neoplasm of upper-outer quadrant of right breast in female, estrogen receptor negative ([place])  THERAPY DIAG:  Stiffness of right shoulder, not elsewhere classified  Aftercare following surgery for neoplasm  Localized edema  Abnormal posture  Malignant neoplasm of upper-inner quadrant of right breast in female, estrogen receptor negative ([place])  ONSET DATE: 06/18/22  Rationale for Evaluation and Treatment Rehabilitation  SUBJECTIVE  SUBJECTIVE STATEMENT: I can not reach my arm all the way up. It might be cording. I was also having a lot of rib pain but it is getting better.  PERTINENT HISTORY:  Patient was diagnosed on 11/05/2021 with right grade III invasive ductal carcinoma breast cancer. She has a large axillary lymph node that was biopsied and positive for disease. It is triple negative with a Ki67 of 40%. Completed chemo 11/30/21-04/29/22. 06/18/22- R lumpectomy 0/5 lymph nodes   PAIN:  Are you having pain? No  PRECAUTIONS: Other: at risk for lymphedema on R side  WEIGHT BEARING RESTRICTIONS No  FALLS:  Has patient fallen in last 6 months? Yes. 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 She reports she is walking currently  HAND DOMINANCE : right   PRIOR LEVEL OF FUNCTION: Independent  PATIENT GOALS to regain ROM that I loss in my arm and have exercises to do  to decrease pain   OBJECTIVE  COGNITION:  Overall cognitive status: Within functional limits for tasks assessed   PALPATION: Some increased scar tissue/seroma palpable in R axilla  OBSERVATIONS / OTHER ASSESSMENTS: some swelling present on inferior breast as evidenced by increased pore size  POSTURE: forward head, rounded shoulders  UPPER EXTREMITY AROM/PROM:  A/PROM RIGHT   eval   Shoulder extension 62  Shoulder flexion 122  Shoulder abduction 96  Shoulder internal rotation 63  Shoulder external rotation 88    (Blank rows = not tested)  A/PROM LEFT   eval  Shoulder extension 68  Shoulder flexion 160  Shoulder abduction 175  Shoulder internal rotation 63  Shoulder external rotation 87    (Blank rows = not tested)    LYMPHEDEMA ASSESSMENTS:   SURGERY TYPE/DATE: R lumpectomy and SLNB 06/18/22  NUMBER OF LYMPH NODES REMOVED: 5 - all negative  CHEMOTHERAPY: completed and currently on Keytruda  RADIATION:will need in the future  HORMONE TREATMENT: pt does not require  INFECTIONS: none  LYMPHEDEMA ASSESSMENTS:   LANDMARK RIGHT  eval  10 cm proximal to olecranon process 26.5  Olecranon process 24.5  10 cm proximal to ulnar styloid process 19.1  Just proximal to ulnar styloid process 14.7  Across hand at thumb web space 18.6  At base of 2nd digit 5.6  (Blank rows = not tested)  LANDMARK LEFT  eval  10 cm proximal to olecranon process 26.2  Olecranon process 25  10 cm proximal to ulnar styloid process 19.3  Just proximal to ulnar styloid process 14.6  Across hand at thumb web space 18.5  At base of 2nd digit 5.5  (Blank rows = not tested)   QUICK DASH SURVEY:   [name] - 07/13/22 0001     Open a tight or new jar No difficulty    Do heavy household chores (wash walls, wash floors) Moderate difficulty    Carry a shopping bag or briefcase Mild difficulty    Wash your back Mild difficulty    Use a knife to cut food No difficulty    Recreational  activities in which you take some force or impact through your arm, shoulder, or hand (golf, hammering, tennis) Moderate difficulty    During  the past week, to what extent has your arm, shoulder or hand problem interfered with your normal social activities with family, friends, neighbors, or groups? Not at all    During the past week, to what extent has your arm, shoulder or hand problem limited your work or other regular daily activities Slightly    Arm, shoulder, or hand pain. Moderate    Tingling (pins and needles) in your arm, shoulder, or hand None    Difficulty Sleeping Mild difficulty    DASH Score 22.73 %               TODAY'S TREATMENT  07/13/22: myofascial release to cording from R axilla down upper arm then PROM to RUE in direction of flexion and abduction with marked improvements noted by end of session  PATIENT EDUCATION:  Education details: post op exercises, axillary cording, truncal stretch Person educated: Patient Education method: Consulting civil engineer, Demonstration, Verbal cues, and Handouts Education comprehension: verbalized understanding   HOME EXERCISE PROGRAM: Post op exercises  ASSESSMENT:  CLINICAL IMPRESSION: Patient is a 41 y.o. female who was seen today for physical therapy evaluation and treatment for decreased R shoulder ROM, cording, increased edema. Pt has limited R shoulder ROM due to axillary cording. She also presents with increased fluid in inferior breast and a possible seroma in R axilla which she reports has been improving. She reports R sided lower anterior rib pain and discomfort that is getting better but is still tender to the touch. Pt would benefit from skilled PT services to improve R shoulder ROM, decrease edema and progress pt towards independence with a home exercise program.     OBJECTIVE IMPAIRMENTS decreased ROM, increased edema, increased fascial restrictions, increased muscle spasms, impaired UE functional use, and postural dysfunction.    ACTIVITY LIMITATIONS lifting and reach over head  PARTICIPATION LIMITATIONS:  none  PERSONAL FACTORS  none  are also affecting patient's functional outcome.   REHAB POTENTIAL: Excellent  CLINICAL DECISION MAKING: Stable/uncomplicated  EVALUATION COMPLEXITY: Low  GOALS: Goals reviewed with patient? Yes  SHORT TERM GOALS=LONG TERM GOALS  Target date: 08/10/2022    Pt will demonstrate 160 degrees of R shoulder flexion to allow her to reach overhead.  Baseline: 122 Goal status: INITIAL  2.  Pt will demonstrate 165 degrees of L shoulder abduction to allow her to reach up and out to the side.  Baseline: 96 Goal status: INITIAL  3.  Pt will report she is no longer having rib pain to allow her improved comfort.  Baseline:  Goal status: INITIAL  4.  Pt will not demonstrate any increase in pore size in inferior breast to decrease risk of cellulitis.  Baseline:  Goal status: INITIAL  5.  Pt will be independent in a home exercise program for continued stretching and strengthening.  Baseline:  Goal status: INITIAL  PLAN: PT FREQUENCY: 2x/week  PT DURATION: 4 weeks  PLANNED INTERVENTIONS: Therapeutic exercises, Therapeutic activity, Patient/Family education, Self Care, Orthotic/Fit training, Manual lymph drainage, Compression bandaging, scar mobilization, Taping, Vasopneumatic device, and Manual therapy  PLAN FOR NEXT SESSION: PROM to R shoulder, MFR to cording, MLD to R breast and axilla, is pt wearing compression bra?   [facility], PT 07/13/2022, 2:58 PM

## 2022-07-14 ENCOUNTER — Encounter: Payer: Self-pay | Admitting: *Deleted

## 2022-07-14 ENCOUNTER — Telehealth: Payer: Self-pay | Admitting: Hematology and Oncology

## 2022-07-14 NOTE — Telephone Encounter (Signed)
Per 7/19 phone line pt called to r/s time of appointment.  Time picked by pt

## 2022-07-19 ENCOUNTER — Other Ambulatory Visit: Payer: Self-pay

## 2022-07-19 ENCOUNTER — Ambulatory Visit
Admission: RE | Admit: 2022-07-19 | Discharge: 2022-07-19 | Disposition: A | Discharge status: Home / Self Care | Payer: 59 | Source: Ambulatory Visit | Attending: Radiation Oncology | Admitting: Radiation Oncology

## 2022-07-19 DIAGNOSIS — C50411 Malignant neoplasm of upper-outer quadrant of right female breast: Secondary | ICD-10-CM | POA: Diagnosis not present

## 2022-07-20 ENCOUNTER — Encounter: Payer: Self-pay | Admitting: *Deleted

## 2022-07-20 ENCOUNTER — Encounter: Payer: 59 | Admitting: Physical Therapy

## 2022-07-20 ENCOUNTER — Other Ambulatory Visit: Payer: Self-pay | Admitting: *Deleted

## 2022-07-20 DIAGNOSIS — Z171 Estrogen receptor negative status [ER-]: Secondary | ICD-10-CM

## 2022-07-21 ENCOUNTER — Ambulatory Visit: Payer: 59 | Admitting: Physical Therapy

## 2022-07-22 ENCOUNTER — Inpatient Hospital Stay: Payer: 59

## 2022-07-22 ENCOUNTER — Other Ambulatory Visit: Payer: Self-pay

## 2022-07-22 VITALS — BP 106/59 | HR 64 | Temp 98.4°F | Resp 16 | Wt 159.2 lb

## 2022-07-22 DIAGNOSIS — Z171 Estrogen receptor negative status [ER-]: Secondary | ICD-10-CM

## 2022-07-22 DIAGNOSIS — Z5112 Encounter for antineoplastic immunotherapy: Secondary | ICD-10-CM | POA: Diagnosis not present

## 2022-07-22 DIAGNOSIS — Z95828 Presence of other vascular implants and grafts: Secondary | ICD-10-CM

## 2022-07-22 LAB — CBC WITH DIFFERENTIAL (CANCER CENTER ONLY)
Abs Immature Granulocytes: 0 10*3/uL (ref 0.00–0.07)
Basophils Absolute: 0 10*3/uL (ref 0.0–0.1)
Basophils Relative: 0 %
Eosinophils Absolute: 0 10*3/uL (ref 0.0–0.5)
Eosinophils Relative: 1 %
HCT: 32.2 % — ABNORMAL LOW (ref 36.0–46.0)
Hemoglobin: 10.7 g/dL — ABNORMAL LOW (ref 12.0–15.0)
Immature Granulocytes: 0 %
Lymphocytes Relative: 27 %
Lymphs Abs: 0.9 10*3/uL (ref 0.7–4.0)
MCH: 31.2 pg (ref 26.0–34.0)
MCHC: 33.2 g/dL (ref 30.0–36.0)
MCV: 93.9 fL (ref 80.0–100.0)
Monocytes Absolute: 0.2 10*3/uL (ref 0.1–1.0)
Monocytes Relative: 7 %
Neutro Abs: 2.1 10*3/uL (ref 1.7–7.7)
Neutrophils Relative %: 65 %
Platelet Count: 167 10*3/uL (ref 150–400)
RBC: 3.43 MIL/uL — ABNORMAL LOW (ref 3.87–5.11)
RDW: 13.7 % (ref 11.5–15.5)
WBC Count: 3.2 10*3/uL — ABNORMAL LOW (ref 4.0–10.5)
nRBC: 0 % (ref 0.0–0.2)

## 2022-07-22 LAB — CMP (CANCER CENTER ONLY)
ALT: 9 U/L (ref 0–44)
AST: 12 U/L — ABNORMAL LOW (ref 15–41)
Albumin: 4.2 g/dL (ref 3.5–5.0)
Alkaline Phosphatase: 56 U/L (ref 38–126)
Anion gap: 4 — ABNORMAL LOW (ref 5–15)
BUN: 17 mg/dL (ref 6–20)
CO2: 26 mmol/L (ref 22–32)
Calcium: 9.1 mg/dL (ref 8.9–10.3)
Chloride: 108 mmol/L (ref 98–111)
Creatinine: 0.83 mg/dL (ref 0.44–1.00)
GFR, Estimated: >60 mL/min (ref >60)
Glucose, Bld: 171 mg/dL — ABNORMAL HIGH (ref 70–99)
Potassium: 3.9 mmol/L (ref 3.5–5.1)
Sodium: 138 mmol/L (ref 135–145)
Total Bilirubin: 0.3 mg/dL (ref 0.3–1.2)
Total Protein: 6.7 g/dL (ref 6.5–8.1)

## 2022-07-22 LAB — TSH: TSH: 9.021 u[IU]/mL — ABNORMAL HIGH (ref 0.350–4.500)

## 2022-07-22 MED ORDER — HEPARIN SOD (PORK) LOCK FLUSH 100 UNIT/ML IV SOLN
500.0000 [IU] | Freq: Once | INTRAVENOUS | Status: AC | PRN
  Administered 2022-07-22: 500 [IU]

## 2022-07-22 MED ORDER — SODIUM CHLORIDE 0.9% FLUSH
10.0000 mL | INTRAVENOUS | Status: DC | PRN
  Administered 2022-07-22: 10 mL

## 2022-07-22 MED ORDER — SODIUM CHLORIDE 0.9% FLUSH
10.0000 mL | Freq: Once | INTRAVENOUS | Status: AC
  Administered 2022-07-22: 10 mL

## 2022-07-22 MED ORDER — SODIUM CHLORIDE 0.9 % IV SOLN
200.0000 mg | Freq: Once | INTRAVENOUS | Status: AC
  Administered 2022-07-22: 200 mg via INTRAVENOUS
  Filled 2022-07-22: qty 200

## 2022-07-22 MED ORDER — SODIUM CHLORIDE 0.9 % IV SOLN: Freq: Once | INTRAVENOUS | Status: AC

## 2022-07-22 NOTE — Patient Instructions (Signed)
[facility] MEDICAL ONCOLOGY  Discharge Instructions: Thank you for choosing [facility] to provide your oncology and hematology care.   If you have a lab appointment with the Cancer Center, please go directly to the Cancer Center and check in at the registration area.   Wear comfortable clothing and clothing appropriate for easy access to any Portacath or PICC line.   We strive to give you quality time with your provider. You may need to reschedule your appointment if you arrive late (15 or more minutes).  Arriving late affects you and other patients whose appointments are after yours.  Also, if you miss three or more appointments without notifying the office, you may be dismissed from the clinic at the provider's discretion.      For prescription refill requests, have your pharmacy contact our office and allow 72 hours for refills to be completed.    Today you received the following chemotherapy and/or immunotherapy agents: Keytruda      To help prevent nausea and vomiting after your treatment, we encourage you to take your nausea medication as directed.  BELOW ARE SYMPTOMS THAT SHOULD BE REPORTED IMMEDIATELY: *FEVER GREATER THAN 100.4 F (38 C) OR HIGHER *CHILLS OR SWEATING *NAUSEA AND VOMITING THAT IS NOT CONTROLLED WITH YOUR NAUSEA MEDICATION *UNUSUAL SHORTNESS OF BREATH *UNUSUAL BRUISING OR BLEEDING *URINARY PROBLEMS (pain or burning when urinating, or frequent urination) *BOWEL PROBLEMS (unusual diarrhea, constipation, pain near the anus) TENDERNESS IN MOUTH AND THROAT WITH OR WITHOUT PRESENCE OF ULCERS (sore throat, sores in mouth, or a toothache) UNUSUAL RASH, SWELLING OR PAIN  UNUSUAL VAGINAL DISCHARGE OR ITCHING   Items with * indicate a potential emergency and should be followed up as soon as possible or go to the Emergency Department if any problems should occur.  Please show the CHEMOTHERAPY ALERT CARD or IMMUNOTHERAPY ALERT CARD at check-in to  the Emergency Department and triage nurse.  Should you have questions after your visit or need to cancel or reschedule your appointment, please contact [facility]ICAL [facility]COL[facility]  Dept: [phone]  and follow the prompts.  Office hours are 8:00 a.m. to 4:30 p.m. Monday - Friday. Please note that voicemails left after 4:00 p.m. may not be returned until the following business day.  We are closed weekends and major holidays. You have access to a nurse at all times for urgent questions. Please call the main number to the clinic Dept: [phone] and follow the prompts.   For any non-urgent questions, you may also contact your provider using MyChart. We now offer e-Visits for anyone 18 and older to request care online for non-urgent symptoms. For details visit [email or web address].   Also download the MyChart app! Go to the app store, search "MyChart", open the app, select [facility], and log in with your MyChart username and password.  Masks are optional in the cancer centers. If you would like for your care team to wear a mask while they are taking care of you, please let them know. For doctor visits, patients may have with them one support Isis Costanza who is at least 40 years old. At this time, visitors are not allowed in the infusion area. 

## 2022-07-23 LAB — T4: T4, Total: 6.2 ug/dL (ref 4.5–12.0)

## 2022-07-27 ENCOUNTER — Ambulatory Visit
Admission: RE | Admit: 2022-07-27 | Discharge: 2022-07-27 | Disposition: A | Discharge status: Home / Self Care | Payer: 59 | Source: Ambulatory Visit | Attending: Radiation Oncology | Admitting: Radiation Oncology

## 2022-07-27 ENCOUNTER — Other Ambulatory Visit: Payer: Self-pay

## 2022-07-27 ENCOUNTER — Ambulatory Visit: Payer: 59 | Attending: Hematology and Oncology

## 2022-07-27 DIAGNOSIS — D72819 Decreased white blood cell count, unspecified: Secondary | ICD-10-CM | POA: Diagnosis not present

## 2022-07-27 DIAGNOSIS — Z452 Encounter for adjustment and management of vascular access device: Secondary | ICD-10-CM | POA: Insufficient documentation

## 2022-07-27 DIAGNOSIS — C50411 Malignant neoplasm of upper-outer quadrant of right female breast: Secondary | ICD-10-CM | POA: Insufficient documentation

## 2022-07-27 DIAGNOSIS — M25611 Stiffness of right shoulder, not elsewhere classified: Secondary | ICD-10-CM | POA: Insufficient documentation

## 2022-07-27 DIAGNOSIS — Z79890 Hormone replacement therapy (postmenopausal): Secondary | ICD-10-CM | POA: Insufficient documentation

## 2022-07-27 DIAGNOSIS — Z5112 Encounter for antineoplastic immunotherapy: Secondary | ICD-10-CM | POA: Diagnosis not present

## 2022-07-27 DIAGNOSIS — R293 Abnormal posture: Secondary | ICD-10-CM | POA: Diagnosis present

## 2022-07-27 DIAGNOSIS — Z483 Aftercare following surgery for neoplasm: Secondary | ICD-10-CM | POA: Insufficient documentation

## 2022-07-27 DIAGNOSIS — C50211 Malignant neoplasm of upper-inner quadrant of right female breast: Secondary | ICD-10-CM | POA: Insufficient documentation

## 2022-07-27 DIAGNOSIS — R600 Localized edema: Secondary | ICD-10-CM | POA: Insufficient documentation

## 2022-07-27 DIAGNOSIS — E039 Hypothyroidism, unspecified: Secondary | ICD-10-CM | POA: Insufficient documentation

## 2022-07-27 DIAGNOSIS — Z171 Estrogen receptor negative status [ER-]: Secondary | ICD-10-CM | POA: Insufficient documentation

## 2022-07-27 LAB — RAD ONC ARIA SESSION SUMMARY

## 2022-07-27 NOTE — Therapy (Signed)
OUTPATIENT PHYSICAL THERAPY TREATMENT NOTE   Patient Name: Sydney Rios MRN: [id number] DOB:16-Jan-1981, 41 y.o., female Today's Date: 07/27/2022  PCP:  Horald Pollen, MD REFERRING PROVIDER: Nicholas Lose, MD  END OF SESSION:   PT End of Session - 07/27/22 1008     Visit Number 2    Number of Visits 9    Date for PT Re-Evaluation 08/10/22    PT Start Time 1006    PT Stop Time 1100    PT Time Calculation (min) 54 min    Activity Tolerance Patient tolerated treatment well    Behavior During Therapy WFL for tasks assessed/performed             Past Medical History:  Diagnosis Date   Anxiety    Chronic kidney disease    hx kidney stones   Complication of anesthesia    runs heart rate in the 30-40s in PACU   History of kidney stones    Hx gestational diabetes    Hx of Hashimoto thyroiditis    PONV (postoperative nausea and vomiting)    Past Surgical History:  Procedure Laterality Date   CESAREAN SECTION     CESAREAN SECTION  01/23/2013   Procedure: CESAREAN SECTION;  Surgeon: Luz Lex, MD;  Location: [facility];  Service: Obstetrics;  Laterality: N/A;   CESAREAN SECTION N/A 10/11/2014   Procedure: CESAREAN SECTION;  Surgeon: Cyril Mourning, MD;  Location: [facility];  Service: Obstetrics;  Laterality: N/A;   CESAREAN SECTION N/A 10/24/2018   Procedure: REPEAT CESAREAN SECTION;  Surgeon: Dian Queen, MD;  Location: [facility];  Service: Obstetrics;  Laterality: N/A;  Tracey RNFA   LITHOTRIPSY     PORTACATH PLACEMENT Left 11/25/2021   Procedure: PORT PLACEMENT;  Surgeon: Rolm Bookbinder, MD;  Location: [facility];  Service: General;  Laterality: Left;   WISDOM TOOTH EXTRACTION     Patient Active Problem List   Diagnosis Date Noted   Genetic testing 12/01/2021   Port-A-Cath in place 11/30/2021   Malignant neoplasm of upper-outer quadrant of right breast in female, estrogen receptor negative ([place]) 11/11/2021   Abnormal maternal glucose tolerance,  antepartum 09/15/2018   S/P cesarean section 10/11/2014   TACHYCARDIA 05/01/2010    REFERRING DIAG: C50.411,Z17.1 (ICD-10-CM) - Malignant neoplasm of upper-outer quadrant of right breast in female, estrogen receptor negative ([place])  THERAPY DIAG:  Stiffness of right shoulder, not elsewhere classified  Aftercare following surgery for neoplasm  Localized edema  Abnormal posture  Malignant neoplasm of upper-inner quadrant of right breast in female, estrogen receptor negative ([place])  Rationale for Evaluation and Treatment Rehabilitation  PERTINENT HISTORY: Patient was diagnosed on 11/05/2021 with right grade III invasive ductal carcinoma breast cancer. She has a large axillary lymph node that was biopsied and positive for disease. It is triple negative with a Ki67 of 40%. Completed chemo 11/30/21-04/29/22. 06/18/22- R lumpectomy 0/5 lymph nodes  PRECAUTIONS:  Other: at risk for lymphedema on R side  SUBJECTIVE: I can reach both arms OH now. The cording is much improved, just feels a little tight where it was now.   PAIN:  Are you having pain? No   OBJECTIVE: (objective measures completed at initial evaluation unless otherwise dated)   PALPATION: Some increased scar tissue/seroma palpable in R axilla   OBSERVATIONS / OTHER ASSESSMENTS: some swelling present on inferior breast as evidenced by increased pore size   POSTURE: forward head, rounded shoulders   UPPER EXTREMITY AROM/PROM:   A/PROM RIGHT  eval    Shoulder extension 62  Shoulder flexion 122  Shoulder abduction 96  Shoulder internal rotation 63  Shoulder external rotation 88                          (Blank rows = not tested)   A/PROM LEFT   eval  Shoulder extension 68  Shoulder flexion 160  Shoulder abduction 175  Shoulder internal rotation 63  Shoulder external rotation 87                          (Blank rows = not tested)       LYMPHEDEMA ASSESSMENTS:    SURGERY TYPE/DATE: R lumpectomy and SLNB 06/18/22    NUMBER OF LYMPH NODES REMOVED: 5 - all negative   CHEMOTHERAPY: completed and currently on Keytruda   RADIATION:will need in the future   HORMONE TREATMENT: pt does not require   INFECTIONS: none   LYMPHEDEMA ASSESSMENTS:    LANDMARK RIGHT  eval  10 cm proximal to olecranon process 26.5  Olecranon process 24.5  10 cm proximal to ulnar styloid process 19.1  Just proximal to ulnar styloid process 14.7  Across hand at thumb web space 18.6  At base of 2nd digit 5.6  (Blank rows = not tested)   LANDMARK LEFT  eval  10 cm proximal to olecranon process 26.2  Olecranon process 25  10 cm proximal to ulnar styloid process 19.3  Just proximal to ulnar styloid process 14.6  Across hand at thumb web space 18.5  At base of 2nd digit 5.5  (Blank rows = not tested)     QUICK DASH SURVEY:    [name] - 07/13/22 0001       Open a tight or new jar No difficulty     Do heavy household chores (wash walls, wash floors) Moderate difficulty     Carry a shopping bag or briefcase Mild difficulty     Wash your back Mild difficulty     Use a knife to cut food No difficulty     Recreational activities in which you take some force or impact through your arm, shoulder, or hand (golf, hammering, tennis) Moderate difficulty     During the past week, to what extent has your arm, shoulder or hand problem interfered with your normal social activities with family, friends, neighbors, or groups? Not at all     During the past week, to what extent has your arm, shoulder or hand problem limited your work or other regular daily activities Slightly     Arm, shoulder, or hand pain. Moderate     Tingling (pins and needles) in your arm, shoulder, or hand None     Difficulty Sleeping Mild difficulty     DASH Score 22.73 %                       TODAY'S TREATMENT  07/27/22: Manual Therapy MLD: In Supine short neck, 5 diaphragmatic breaths, Lt axillary and Rt inguinal nodes, anterior inter-axillary and  Rt axillo-inguinal anastomosis, then focused on Rt breast MFR along Rt upper arm to axilla where tightness palpable but no cording palpated today Scap mobs in Lt S/L into protraction and retraction, tight initally but loosened up after mobs; also scapular depression applied throughout P/ROM of Rt shoulder P/ROM of Rt shoulder into flexion, abd and D2 to pts available end motions STM to  Rt scapula and upper trap with trigger point release at medial scapula border with cocoa butter  07/13/22: myofascial release to cording from R axilla down upper arm then PROM to RUE in direction of flexion and abduction with marked improvements noted by end of session   PATIENT EDUCATION:  Education details: post op exercises, axillary cording, truncal stretch Person educated: Patient Education method: Explanation, Demonstration, Verbal cues, and Handouts Education comprehension: verbalized understanding     HOME EXERCISE PROGRAM: Post op exercises   ASSESSMENT:   CLINICAL IMPRESSION: Pts cording is much improved since evaluation as is her OH flexion. Continued with manual therapy working on areas that are still tight in her upper arm and axilla. Also incorporated Rt breast MLD and scapular mobs as well.      OBJECTIVE IMPAIRMENTS decreased ROM, increased edema, increased fascial restrictions, increased muscle spasms, impaired UE functional use, and postural dysfunction.    ACTIVITY LIMITATIONS lifting and reach over head   PARTICIPATION LIMITATIONS:  none   PERSONAL FACTORS  none  are also affecting patient's functional outcome.    REHAB POTENTIAL: Excellent   CLINICAL DECISION MAKING: Stable/uncomplicated   EVALUATION COMPLEXITY: Low   GOALS: Goals reviewed with patient? Yes   SHORT TERM GOALS=LONG TERM GOALS  Target date: 08/10/2022     Pt will demonstrate 160 degrees of R shoulder flexion to allow her to reach overhead.  Baseline: 122 Goal status: INITIAL   2.  Pt will demonstrate 165  degrees of L shoulder abduction to allow her to reach up and out to the side.  Baseline: 96 Goal status: INITIAL   3.  Pt will report she is no longer having rib pain to allow her improved comfort.  Baseline:  Goal status: INITIAL   4.  Pt will not demonstrate any increase in pore size in inferior breast to decrease risk of cellulitis.  Baseline:  Goal status: INITIAL   5.  Pt will be independent in a home exercise program for continued stretching and strengthening.  Baseline:  Goal status: INITIAL   PLAN: PT FREQUENCY: 2x/week   PT DURATION: 4 weeks   PLANNED INTERVENTIONS: Therapeutic exercises, Therapeutic activity, Patient/Family education, Self Care, Orthotic/Fit training, Manual lymph drainage, Compression bandaging, scar mobilization, Taping, Vasopneumatic device, and Manual therapy   PLAN FOR NEXT SESSION: PROM to R shoulder, MFR to cording, MLD to R breast and axilla, is pt wearing compression bra?    Otelia Limes, PTA 07/27/2022, 10:08 AM

## 2022-07-28 ENCOUNTER — Other Ambulatory Visit: Payer: Self-pay

## 2022-07-28 ENCOUNTER — Ambulatory Visit
Admission: RE | Admit: 2022-07-28 | Discharge: 2022-07-28 | Disposition: A | Discharge status: Home / Self Care | Payer: 59 | Source: Ambulatory Visit | Attending: Radiation Oncology | Admitting: Radiation Oncology

## 2022-07-28 DIAGNOSIS — Z5112 Encounter for antineoplastic immunotherapy: Secondary | ICD-10-CM | POA: Diagnosis not present

## 2022-07-28 LAB — RAD ONC ARIA SESSION SUMMARY
Course Elapsed Days: 1
Plan Fractions Treated to Date: 1
Plan Fractions Treated to Date: 1
Plan Prescribed Dose Per Fraction: 1.8 Gy
Plan Prescribed Dose Per Fraction: 1.8 Gy
Plan Total Fractions Prescribed: 27
Plan Total Fractions Prescribed: 27
Plan Total Prescribed Dose: 48.6 Gy
Plan Total Prescribed Dose: 48.6 Gy
Reference Point Dosage Given to Date: 3.6 Gy
Reference Point Dosage Given to Date: 3.6 Gy
Reference Point Session Dosage Given: 1.8 Gy
Reference Point Session Dosage Given: 1.8 Gy
Session Number: 2

## 2022-07-29 ENCOUNTER — Other Ambulatory Visit: Payer: Self-pay | Admitting: Hematology and Oncology

## 2022-07-29 ENCOUNTER — Other Ambulatory Visit: Payer: Self-pay

## 2022-07-29 ENCOUNTER — Ambulatory Visit
Admission: RE | Admit: 2022-07-29 | Discharge: 2022-07-29 | Disposition: A | Discharge status: Home / Self Care | Payer: 59 | Source: Ambulatory Visit | Attending: Radiation Oncology | Admitting: Radiation Oncology

## 2022-07-29 DIAGNOSIS — Z5112 Encounter for antineoplastic immunotherapy: Secondary | ICD-10-CM | POA: Diagnosis not present

## 2022-07-29 LAB — RAD ONC ARIA SESSION SUMMARY
Course Elapsed Days: 2
Plan Fractions Treated to Date: 2
Plan Fractions Treated to Date: 2
Plan Prescribed Dose Per Fraction: 1.8 Gy
Plan Prescribed Dose Per Fraction: 1.8 Gy
Plan Total Fractions Prescribed: 27
Plan Total Fractions Prescribed: 27
Plan Total Prescribed Dose: 48.6 Gy
Plan Total Prescribed Dose: 48.6 Gy
Reference Point Dosage Given to Date: 5.4 Gy
Reference Point Dosage Given to Date: 5.4 Gy
Reference Point Session Dosage Given: 1.8 Gy
Reference Point Session Dosage Given: 1.8 Gy
Session Number: 3

## 2022-07-29 NOTE — Progress Notes (Signed)
Pt here for patient teaching.  Pt given Radiation and You booklet, skin care instructions, alra deodorant and Radiaplex.    Reviewed areas of pertinence such as fatigue, hair loss, skin changes, breast tenderness, and breast swelling. Pt able to give teach back of to pat skin and use unscented/gentle soap,apply Radiaplex bid, avoid applying anything to skin within 4 hours of treatment, avoid wearing an under wire bra, and to use an electric razor if they must shave. Pt verbalizes understanding of information given and will contact nursing with any questions or concerns.    Rickayla Wieland M. Latrisha Coiro RN, BSN  

## 2022-07-30 ENCOUNTER — Ambulatory Visit
Admission: RE | Admit: 2022-07-30 | Discharge: 2022-07-30 | Disposition: A | Discharge status: Home / Self Care | Payer: 59 | Source: Ambulatory Visit | Attending: Radiation Oncology | Admitting: Radiation Oncology

## 2022-07-30 ENCOUNTER — Other Ambulatory Visit: Payer: Self-pay

## 2022-07-30 DIAGNOSIS — Z171 Estrogen receptor negative status [ER-]: Secondary | ICD-10-CM

## 2022-07-30 DIAGNOSIS — Z5112 Encounter for antineoplastic immunotherapy: Secondary | ICD-10-CM | POA: Diagnosis not present

## 2022-07-30 LAB — RAD ONC ARIA SESSION SUMMARY
Course Elapsed Days: 3
Plan Fractions Treated to Date: 3
Plan Fractions Treated to Date: 3
Plan Prescribed Dose Per Fraction: 1.8 Gy
Plan Prescribed Dose Per Fraction: 1.8 Gy
Plan Total Fractions Prescribed: 27
Plan Total Fractions Prescribed: 27
Plan Total Prescribed Dose: 48.6 Gy
Plan Total Prescribed Dose: 48.6 Gy
Reference Point Dosage Given to Date: 7.2 Gy
Reference Point Dosage Given to Date: 7.2 Gy
Reference Point Session Dosage Given: 1.8 Gy
Reference Point Session Dosage Given: 1.8 Gy
Session Number: 4

## 2022-07-30 MED ORDER — RADIAPLEXRX EX GEL: Freq: Once | CUTANEOUS | Status: AC

## 2022-07-30 MED ORDER — ALRA NON-METALLIC DEODORANT (RAD-ONC)
1.0000 | Freq: Once | TOPICAL | Status: AC
  Administered 2022-07-30: 1 via TOPICAL

## 2022-08-02 ENCOUNTER — Other Ambulatory Visit: Payer: Self-pay

## 2022-08-02 ENCOUNTER — Ambulatory Visit
Admission: RE | Admit: 2022-08-02 | Discharge: 2022-08-02 | Disposition: A | Discharge status: Home / Self Care | Payer: 59 | Source: Ambulatory Visit | Attending: Radiation Oncology | Admitting: Radiation Oncology

## 2022-08-02 DIAGNOSIS — Z5112 Encounter for antineoplastic immunotherapy: Secondary | ICD-10-CM | POA: Diagnosis not present

## 2022-08-02 LAB — RAD ONC ARIA SESSION SUMMARY
Course Elapsed Days: 6
Plan Fractions Treated to Date: 4
Plan Fractions Treated to Date: 4
Plan Prescribed Dose Per Fraction: 1.8 Gy
Plan Prescribed Dose Per Fraction: 1.8 Gy
Plan Total Fractions Prescribed: 27
Plan Total Fractions Prescribed: 27
Plan Total Prescribed Dose: 48.6 Gy
Plan Total Prescribed Dose: 48.6 Gy
Reference Point Dosage Given to Date: 9 Gy
Reference Point Dosage Given to Date: 9 Gy
Reference Point Session Dosage Given: 1.8 Gy
Reference Point Session Dosage Given: 1.8 Gy
Session Number: 5

## 2022-08-03 ENCOUNTER — Other Ambulatory Visit: Payer: Self-pay

## 2022-08-03 ENCOUNTER — Encounter: Payer: Self-pay | Admitting: Rehabilitation

## 2022-08-03 ENCOUNTER — Ambulatory Visit: Payer: 59 | Admitting: Rehabilitation

## 2022-08-03 ENCOUNTER — Ambulatory Visit
Admission: RE | Admit: 2022-08-03 | Discharge: 2022-08-03 | Disposition: A | Discharge status: Home / Self Care | Payer: 59 | Source: Ambulatory Visit | Attending: Radiation Oncology | Admitting: Radiation Oncology

## 2022-08-03 DIAGNOSIS — R293 Abnormal posture: Secondary | ICD-10-CM

## 2022-08-03 DIAGNOSIS — M25611 Stiffness of right shoulder, not elsewhere classified: Secondary | ICD-10-CM | POA: Diagnosis not present

## 2022-08-03 DIAGNOSIS — Z483 Aftercare following surgery for neoplasm: Secondary | ICD-10-CM

## 2022-08-03 DIAGNOSIS — Z5112 Encounter for antineoplastic immunotherapy: Secondary | ICD-10-CM | POA: Diagnosis not present

## 2022-08-03 DIAGNOSIS — C50211 Malignant neoplasm of upper-inner quadrant of right female breast: Secondary | ICD-10-CM

## 2022-08-03 DIAGNOSIS — R600 Localized edema: Secondary | ICD-10-CM

## 2022-08-03 LAB — RAD ONC ARIA SESSION SUMMARY
Course Elapsed Days: 7
Plan Fractions Treated to Date: 5
Plan Fractions Treated to Date: 5
Plan Prescribed Dose Per Fraction: 1.8 Gy
Plan Prescribed Dose Per Fraction: 1.8 Gy
Plan Total Fractions Prescribed: 27
Plan Total Fractions Prescribed: 27
Plan Total Prescribed Dose: 48.6 Gy
Plan Total Prescribed Dose: 48.6 Gy
Reference Point Dosage Given to Date: 10.8 Gy
Reference Point Dosage Given to Date: 10.8 Gy
Reference Point Session Dosage Given: 1.8 Gy
Reference Point Session Dosage Given: 1.8 Gy
Session Number: 6

## 2022-08-03 NOTE — Therapy (Signed)
OUTPATIENT PHYSICAL THERAPY TREATMENT NOTE   Patient Name: Sydney Rios MRN: [id number] DOB:04-01-1981, 41 y.o., female Today's Date: 08/03/2022  PCP:  Horald Pollen, MD REFERRING PROVIDER: Nicholas Lose, MD  END OF SESSION:   PT End of Session - 08/03/22 1506     Visit Number 3    Number of Visits 9    Date for PT Re-Evaluation 08/10/22    PT Start Time 8616    PT Stop Time 8372    PT Time Calculation (min) 51 min    Activity Tolerance Patient tolerated treatment well    Behavior During Therapy WFL for tasks assessed/performed             Past Medical History:  Diagnosis Date   Anxiety    Chronic kidney disease    hx kidney stones   Complication of anesthesia    runs heart rate in the 30-40s in PACU   History of kidney stones    Hx gestational diabetes    Hx of Hashimoto thyroiditis    PONV (postoperative nausea and vomiting)    Past Surgical History:  Procedure Laterality Date   CESAREAN SECTION     CESAREAN SECTION  01/23/2013   Procedure: CESAREAN SECTION;  Surgeon: Luz Lex, MD;  Location: [facility];  Service: Obstetrics;  Laterality: N/A;   CESAREAN SECTION N/A 10/11/2014   Procedure: CESAREAN SECTION;  Surgeon: Cyril Mourning, MD;  Location: [facility];  Service: Obstetrics;  Laterality: N/A;   CESAREAN SECTION N/A 10/24/2018   Procedure: REPEAT CESAREAN SECTION;  Surgeon: Dian Queen, MD;  Location: [facility];  Service: Obstetrics;  Laterality: N/A;  Tracey RNFA   LITHOTRIPSY     PORTACATH PLACEMENT Left 11/25/2021   Procedure: PORT PLACEMENT;  Surgeon: Rolm Bookbinder, MD;  Location: [facility];  Service: General;  Laterality: Left;   WISDOM TOOTH EXTRACTION     Patient Active Problem List   Diagnosis Date Noted   Genetic testing 12/01/2021   Port-A-Cath in place 11/30/2021   Malignant neoplasm of upper-outer quadrant of right breast in female, estrogen receptor negative ([place]) 11/11/2021   Abnormal maternal glucose tolerance,  antepartum 09/15/2018   S/P cesarean section 10/11/2014   TACHYCARDIA 05/01/2010    REFERRING DIAG: C50.411,Z17.1 (ICD-10-CM) - Malignant neoplasm of upper-outer quadrant of right breast in female, estrogen receptor negative ([place])  THERAPY DIAG:  Stiffness of right shoulder, not elsewhere classified  Aftercare following surgery for neoplasm  Localized edema  Abnormal posture  Malignant neoplasm of upper-inner quadrant of right breast in female, estrogen receptor negative ([place])  Rationale for Evaluation and Treatment Rehabilitation  PERTINENT HISTORY: Patient was diagnosed on 11/05/2021 with right grade III invasive ductal carcinoma breast cancer. She has a large axillary lymph node that was biopsied and positive for disease. It is triple negative with a Ki67 of 40%. Completed chemo 11/30/21-04/29/22. 06/18/22- R lumpectomy 0/5 lymph nodes  PRECAUTIONS:  Other: at risk for lymphedema on R side  SUBJECTIVE: I had radiation #6 The skin is doing okay.  I have noticed the tension in the muscles in the upper neck.    PAIN:  Are you having pain? No   OBJECTIVE: (objective measures completed at initial evaluation unless otherwise dated)   PALPATION: Some increased scar tissue/seroma palpable in R axilla   OBSERVATIONS / OTHER ASSESSMENTS: some swelling present on inferior breast as evidenced by increased pore size   POSTURE: forward head, rounded shoulders   UPPER EXTREMITY AROM/PROM:   A/PROM RIGHT  eval   08/03/22  Shoulder extension 62   Shoulder flexion 122 167 - axilla pull  Shoulder abduction 96 175 - no pull   Shoulder internal rotation 63   Shoulder external rotation 88                           (Blank rows = not tested)   A/PROM LEFT   eval  Shoulder extension 68  Shoulder flexion 160  Shoulder abduction 175  Shoulder internal rotation 63  Shoulder external rotation 87                          (Blank rows = not tested)       LYMPHEDEMA ASSESSMENTS:   SURGERY  TYPE/DATE: R lumpectomy and SLNB 06/18/22  NUMBER OF LYMPH NODES REMOVED: 5 - all negative  CHEMOTHERAPY: completed and currently on Keytruda  RADIATION:will need in the future  HORMONE TREATMENT: pt does not require  INFECTIONS: none   LYMPHEDEMA ASSESSMENTS:    LANDMARK RIGHT  eval  10 cm proximal to olecranon process 26.5  Olecranon process 24.5  10 cm proximal to ulnar styloid process 19.1  Just proximal to ulnar styloid process 14.7  Across hand at thumb web space 18.6  At base of 2nd digit 5.6  (Blank rows = not tested)   LANDMARK LEFT  eval  10 cm proximal to olecranon process 26.2  Olecranon process 25  10 cm proximal to ulnar styloid process 19.3  Just proximal to ulnar styloid process 14.6  Across hand at thumb web space 18.5  At base of 2nd digit 5.5  (Blank rows = not tested)     QUICK DASH SURVEY:    [name] - 07/13/22 0001       Open a tight or new jar No difficulty     Do heavy household chores (wash walls, wash floors) Moderate difficulty     Carry a shopping bag or briefcase Mild difficulty     Wash your back Mild difficulty     Use a knife to cut food No difficulty     Recreational activities in which you take some force or impact through your arm, shoulder, or hand (golf, hammering, tennis) Moderate difficulty     During the past week, to what extent has your arm, shoulder or hand problem interfered with your normal social activities with family, friends, neighbors, or groups? Not at all     During the past week, to what extent has your arm, shoulder or hand problem limited your work or other regular daily activities Slightly     Arm, shoulder, or hand pain. Moderate     Tingling (pins and needles) in your arm, shoulder, or hand None     Difficulty Sleeping Mild difficulty     DASH Score 22.73 %                       TODAY'S TREATMENT  08/03/22: Manual Therapy MLD: In Supine short neck, 5 diaphragmatic breaths, Lt axillary and Rt inguinal  nodes, anterior inter-axillary and Rt axillo-inguinal anastomosis, then focused on Rt lateral breast with pt permission.  MFR along Rt upper arm to axilla where tightness palpable but no cording palpated today Scap mobs in Lt S/L into protraction and retraction, depression applied throughout P/ROM of Rt shoulder P/ROM of Rt shoulder into flexion, abd and D2 to pts available end  motions STM to Rt scapula and upper trap with trigger point release at medial scapula border with cocoa butter and UT  07/27/22: Manual Therapy MLD: In Supine short neck, 5 diaphragmatic breaths, Lt axillary and Rt inguinal nodes, anterior inter-axillary and Rt axillo-inguinal anastomosis, then focused on Rt breast MFR along Rt upper arm to axilla where tightness palpable but no cording palpated today Scap mobs in Lt S/L into protraction and retraction, tight initally but loosened up after mobs; also scapular depression applied throughout P/ROM of Rt shoulder P/ROM of Rt shoulder into flexion, abd and D2 to pts available end motions STM to Rt scapula and upper trap with trigger point release at medial scapula border with cocoa butter  07/13/22: myofascial release to cording from R axilla down upper arm then PROM to RUE in direction of flexion and abduction with marked improvements noted by end of session   PATIENT EDUCATION:  Education details: post op exercises, axillary cording, truncal stretch Person educated: Patient Education method: Consulting civil engineer, Demonstration, Verbal cues, and Handouts Education comprehension: verbalized understanding     HOME EXERCISE PROGRAM: Post op exercises   ASSESSMENT:   CLINICAL IMPRESSION: Pts cording is much improved since evaluation with return to baseline AROM and no cording visible.  Continued with manual therapy working on areas that are still tight in her upper quadrant and axilla. Pt is interested in adding some strengthening so we talked about how we could add some next visit.   Also decreased to 1x per week due to excellent status.     OBJECTIVE IMPAIRMENTS decreased ROM, increased edema, increased fascial restrictions, increased muscle spasms, impaired UE functional use, and postural dysfunction.    ACTIVITY LIMITATIONS lifting and reach over head   PARTICIPATION LIMITATIONS:  none   PERSONAL FACTORS  none  are also affecting patient's functional outcome.    REHAB POTENTIAL: Excellent   CLINICAL DECISION MAKING: Stable/uncomplicated   EVALUATION COMPLEXITY: Low   GOALS: Goals reviewed with patient? Yes   SHORT TERM GOALS=LONG TERM GOALS  Target date: 08/10/2022     Pt will demonstrate 160 degrees of R shoulder flexion to allow her to reach overhead.  Baseline: 122 Goal status: MET   2.  Pt will demonstrate 165 degrees of L shoulder abduction to allow her to reach up and out to the side.  Baseline: 96 Goal status: MET   3.  Pt will report she is no longer having rib pain to allow her improved comfort.  Baseline:  Goal status: MET   4.  Pt will not demonstrate any increase in pore size in inferior breast to decrease risk of cellulitis.  Baseline:  Goal status: INITIAL   5.  Pt will be independent in a home exercise program for continued stretching and strengthening.  Baseline:  Goal status: INITIAL   PLAN: PT FREQUENCY: 1x/week   PT DURATION: 4 weeks   PLANNED INTERVENTIONS: Therapeutic exercises, Therapeutic activity, Patient/Family education, Self Care, Orthotic/Fit training, Manual lymph drainage, Compression bandaging, scar mobilization, Taping, Vasopneumatic device, and Manual therapy   PLAN FOR NEXT SESSION: STM Rt upper quadrant, PROM,  MLD to R breast and axilla, pt did not want to get a compression bra, try supine scap or similar    Stark Bray, PT 08/03/2022, 4:06 PM

## 2022-08-04 ENCOUNTER — Other Ambulatory Visit: Payer: Self-pay | Admitting: Hematology and Oncology

## 2022-08-04 ENCOUNTER — Other Ambulatory Visit: Payer: Self-pay

## 2022-08-04 ENCOUNTER — Ambulatory Visit
Admission: RE | Admit: 2022-08-04 | Discharge: 2022-08-04 | Disposition: A | Discharge status: Home / Self Care | Payer: 59 | Source: Ambulatory Visit | Attending: Radiation Oncology | Admitting: Radiation Oncology

## 2022-08-04 DIAGNOSIS — Z5112 Encounter for antineoplastic immunotherapy: Secondary | ICD-10-CM | POA: Diagnosis not present

## 2022-08-04 LAB — RAD ONC ARIA SESSION SUMMARY
Course Elapsed Days: 8
Plan Fractions Treated to Date: 6
Plan Fractions Treated to Date: 6
Plan Prescribed Dose Per Fraction: 1.8 Gy
Plan Prescribed Dose Per Fraction: 1.8 Gy
Plan Total Fractions Prescribed: 27
Plan Total Fractions Prescribed: 27
Plan Total Prescribed Dose: 48.6 Gy
Plan Total Prescribed Dose: 48.6 Gy
Reference Point Dosage Given to Date: 12.6 Gy
Reference Point Dosage Given to Date: 12.6 Gy
Reference Point Session Dosage Given: 1.8 Gy
Reference Point Session Dosage Given: 1.8 Gy
Session Number: 7

## 2022-08-05 ENCOUNTER — Ambulatory Visit: Payer: 59

## 2022-08-05 ENCOUNTER — Ambulatory Visit
Admission: RE | Admit: 2022-08-05 | Discharge: 2022-08-05 | Disposition: A | Discharge status: Home / Self Care | Payer: 59 | Source: Ambulatory Visit | Attending: Radiation Oncology | Admitting: Radiation Oncology

## 2022-08-05 ENCOUNTER — Other Ambulatory Visit: Payer: Self-pay

## 2022-08-05 DIAGNOSIS — Z5112 Encounter for antineoplastic immunotherapy: Secondary | ICD-10-CM | POA: Diagnosis not present

## 2022-08-05 LAB — RAD ONC ARIA SESSION SUMMARY
Course Elapsed Days: 9
Plan Fractions Treated to Date: 7
Plan Fractions Treated to Date: 7
Plan Prescribed Dose Per Fraction: 1.8 Gy
Plan Prescribed Dose Per Fraction: 1.8 Gy
Plan Total Fractions Prescribed: 27
Plan Total Fractions Prescribed: 27
Plan Total Prescribed Dose: 48.6 Gy
Plan Total Prescribed Dose: 48.6 Gy
Reference Point Dosage Given to Date: 14.4 Gy
Reference Point Dosage Given to Date: 14.4 Gy
Reference Point Session Dosage Given: 1.8 Gy
Reference Point Session Dosage Given: 1.8 Gy
Session Number: 8

## 2022-08-06 ENCOUNTER — Ambulatory Visit
Admission: RE | Admit: 2022-08-06 | Discharge: 2022-08-06 | Disposition: A | Discharge status: Home / Self Care | Payer: 59 | Source: Ambulatory Visit | Attending: Radiation Oncology | Admitting: Radiation Oncology

## 2022-08-06 ENCOUNTER — Other Ambulatory Visit: Payer: Self-pay

## 2022-08-06 DIAGNOSIS — Z5112 Encounter for antineoplastic immunotherapy: Secondary | ICD-10-CM | POA: Diagnosis not present

## 2022-08-06 LAB — RAD ONC ARIA SESSION SUMMARY
Course Elapsed Days: 10
Plan Fractions Treated to Date: 8
Plan Fractions Treated to Date: 8
Plan Prescribed Dose Per Fraction: 1.8 Gy
Plan Prescribed Dose Per Fraction: 1.8 Gy
Plan Total Fractions Prescribed: 27
Plan Total Fractions Prescribed: 27
Plan Total Prescribed Dose: 48.6 Gy
Plan Total Prescribed Dose: 48.6 Gy
Reference Point Dosage Given to Date: 16.2 Gy
Reference Point Dosage Given to Date: 16.2 Gy
Reference Point Session Dosage Given: 1.8 Gy
Reference Point Session Dosage Given: 1.8 Gy
Session Number: 9

## 2022-08-09 ENCOUNTER — Other Ambulatory Visit: Payer: Self-pay

## 2022-08-09 ENCOUNTER — Ambulatory Visit
Admission: RE | Admit: 2022-08-09 | Discharge: 2022-08-09 | Disposition: A | Discharge status: Home / Self Care | Payer: 59 | Source: Ambulatory Visit | Attending: Radiation Oncology | Admitting: Radiation Oncology

## 2022-08-09 DIAGNOSIS — Z5112 Encounter for antineoplastic immunotherapy: Secondary | ICD-10-CM | POA: Diagnosis not present

## 2022-08-09 LAB — RAD ONC ARIA SESSION SUMMARY
Course Elapsed Days: 13
Plan Fractions Treated to Date: 9
Plan Fractions Treated to Date: 9
Plan Prescribed Dose Per Fraction: 1.8 Gy
Plan Prescribed Dose Per Fraction: 1.8 Gy
Plan Total Fractions Prescribed: 27
Plan Total Fractions Prescribed: 27
Plan Total Prescribed Dose: 48.6 Gy
Plan Total Prescribed Dose: 48.6 Gy
Reference Point Dosage Given to Date: 18 Gy
Reference Point Dosage Given to Date: 18 Gy
Reference Point Session Dosage Given: 1.8 Gy
Reference Point Session Dosage Given: 1.8 Gy
Session Number: 10

## 2022-08-10 ENCOUNTER — Ambulatory Visit: Payer: 59

## 2022-08-10 ENCOUNTER — Other Ambulatory Visit: Payer: Self-pay

## 2022-08-10 ENCOUNTER — Ambulatory Visit
Admission: RE | Admit: 2022-08-10 | Discharge: 2022-08-10 | Disposition: A | Discharge status: Home / Self Care | Payer: 59 | Source: Ambulatory Visit | Attending: Radiation Oncology | Admitting: Radiation Oncology

## 2022-08-10 DIAGNOSIS — M25611 Stiffness of right shoulder, not elsewhere classified: Secondary | ICD-10-CM

## 2022-08-10 DIAGNOSIS — Z483 Aftercare following surgery for neoplasm: Secondary | ICD-10-CM

## 2022-08-10 DIAGNOSIS — R293 Abnormal posture: Secondary | ICD-10-CM

## 2022-08-10 DIAGNOSIS — Z5112 Encounter for antineoplastic immunotherapy: Secondary | ICD-10-CM | POA: Diagnosis not present

## 2022-08-10 DIAGNOSIS — R600 Localized edema: Secondary | ICD-10-CM

## 2022-08-10 DIAGNOSIS — Z171 Estrogen receptor negative status [ER-]: Secondary | ICD-10-CM

## 2022-08-10 LAB — RAD ONC ARIA SESSION SUMMARY
Course Elapsed Days: 14
Plan Fractions Treated to Date: 10
Plan Fractions Treated to Date: 10
Plan Prescribed Dose Per Fraction: 1.8 Gy
Plan Prescribed Dose Per Fraction: 1.8 Gy
Plan Total Fractions Prescribed: 27
Plan Total Fractions Prescribed: 27
Plan Total Prescribed Dose: 48.6 Gy
Plan Total Prescribed Dose: 48.6 Gy
Reference Point Dosage Given to Date: 19.8 Gy
Reference Point Dosage Given to Date: 19.8 Gy
Reference Point Session Dosage Given: 1.8 Gy
Reference Point Session Dosage Given: 1.8 Gy
Session Number: 11

## 2022-08-10 NOTE — Therapy (Signed)
OUTPATIENT PHYSICAL THERAPY TREATMENT NOTE   Patient Name: Sydney Rios MRN: [id number] DOB:[birth year], 41 y.o., female Today's Date: 08/10/2022  PCP:  Sydney Pollen, MD REFERRING PROVIDER: Nicholas Lose, MD  END OF SESSION:   PT End of Session - 08/10/22 1305     Visit Number 4    Number of Visits 12    Date for PT Re-Evaluation 09/07/22    PT Start Time 5400    PT Stop Time 1400    PT Time Calculation (min) 55 min    Activity Tolerance Patient tolerated treatment well    Behavior During Therapy WFL for tasks assessed/performed             Past Medical History:  Diagnosis Date   Anxiety    Chronic kidney disease    hx kidney stones   Complication of anesthesia    runs heart rate in the 30-40s in PACU   History of kidney stones    Hx gestational diabetes    Hx of Hashimoto thyroiditis    PONV (postoperative nausea and vomiting)    Past Surgical History:  Procedure Laterality Date   CESAREAN SECTION     CESAREAN SECTION  01/23/2013   Procedure: CESAREAN SECTION;  Surgeon: Sydney Lex, MD;  Location: [facility];  Service: Obstetrics;  Laterality: N/A;   CESAREAN SECTION N/A 10/11/2014   Procedure: CESAREAN SECTION;  Surgeon: Sydney Mourning, MD;  Location: [facility];  Service: Obstetrics;  Laterality: N/A;   CESAREAN SECTION N/A 10/24/2018   Procedure: REPEAT CESAREAN SECTION;  Surgeon: Sydney Queen, MD;  Location: [facility];  Service: Obstetrics;  Laterality: N/A;  Sydney Rios RNFA   LITHOTRIPSY     PORTACATH PLACEMENT Left 11/25/2021   Procedure: PORT PLACEMENT;  Surgeon: Sydney Bookbinder, MD;  Location: [facility];  Service: General;  Laterality: Left;   WISDOM TOOTH EXTRACTION     Patient Active Problem List   Diagnosis Date Noted   Genetic testing 12/01/2021   Port-A-Cath in place 11/30/2021   Malignant neoplasm of upper-outer quadrant of right breast in female, estrogen receptor negative (Sydney Rios) 11/11/2021   Abnormal maternal glucose tolerance,  antepartum 09/15/2018   S/P cesarean section 10/11/2014   TACHYCARDIA 05/01/2010    REFERRING DIAG: C50.411,Z17.1 (ICD-10-CM) - Malignant neoplasm of upper-outer quadrant of right breast in female, estrogen receptor negative ([place]yd[place] Rios)  THERAPY DIAG:  Stiffness of right shoulder, not elsewhere classified  Aftercare following surgery for neoplasm  Localized edema  Abnormal posture  Malignant neoplasm of upper-inner quadrant of right breast in female, estrogen receptor negative (Sydney Rios)  Rationale for Evaluation and Treatment Rehabilitation  PERTINENT HISTORY: Patient was diagnosed on 11/05/2021 with right grade III invasive ductal carcinoma breast cancer. She has a large axillary lymph node that was biopsied and positive for disease. It is triple negative with a Ki67 of 40%. Completed chemo 11/30/21-04/29/22. 06/18/22- R lumpectomy 0/5 lymph nodes  PRECAUTIONS:  Other: at risk for lymphedema on R side  SUBJECTIVE: I have regained all my ROM but I have a feeling under my breast that feels like it could be cording but it isn't.   The seroma is gone now too. I can pick up my 40 year old now, put away dishes.  I have had number 11 radiation. Sydney Rios is a little pink and itchy but not bad.  I feel like my breast is a little more swollen. I use the cream 3 times per day. I still have a lot of tension in my  UT and wondered about dry needling    PAIN:  Are you having pain? Yes, Neck and right UT PAIN:  Are you having pain? Yes NPRS scale: 4-5/10 Pain location: right UT/neck Pain orientation: Right  PAIN TYPE: aching and tight Pain description: constant and aching  Aggravating factors: sleeping, driving Relieving factors: release techniques   OBJECTIVE: (objective measures completed at initial evaluation unless otherwise dated)   PALPATION: Some increased scar tissue/seroma palpable in R axilla   OBSERVATIONS / OTHER ASSESSMENTS: some swelling present on inferior breast as evidenced by increased  pore size   POSTURE: forward head, rounded shoulders   UPPER EXTREMITY AROM/PROM:   A/PROM RIGHT   eval   08/03/22 08/10/2022  Shoulder extension 62  67  Shoulder flexion 122 167 - axilla pull 168 mild pull  Shoulder abduction 96 175 - no pull  180  Shoulder internal rotation 63    Shoulder external rotation 88  110                          (Blank rows = not tested)   A/PROM LEFT   eval  Shoulder extension 68  Shoulder flexion 160  Shoulder abduction 175  Shoulder internal rotation 63  Shoulder external rotation 87                          (Blank rows = not tested)       LYMPHEDEMA ASSESSMENTS:   SURGERY TYPE/DATE: R lumpectomy and SLNB 06/18/22  NUMBER OF LYMPH NODES REMOVED: 5 - all negative  CHEMOTHERAPY: completed and currently on Keytruda  RADIATION:will need in the future  HORMONE TREATMENT: pt does not require  INFECTIONS: none   LYMPHEDEMA ASSESSMENTS:    LANDMARK RIGHT  eval  10 cm proximal to olecranon process 26.5  Olecranon process 24.5  10 cm proximal to ulnar styloid process 19.1  Just proximal to ulnar styloid process 14.7  Across hand at thumb web space 18.6  At base of 2nd digit 5.6  (Blank rows = not tested)   LANDMARK LEFT  eval  10 cm proximal to olecranon process 26.2  Olecranon process 25  10 cm proximal to ulnar styloid process 19.3  Just proximal to ulnar styloid process 14.6  Across hand at thumb web space 18.5  At base of 2nd digit 5.5  (Blank rows = not tested)     QUICK DASH SURVEY: 16%              TODAY'S TREATMENT  08/10/2022 Discussed sports bra or compression bra and gave script for compression bra as she feels she is swelling more with radiation. Soft tissue massage performed to right UT levator area with cocoa butter and removed with alcohol wipes prior to going to radiation MLD: In Supine short neck, 5 diaphragmatic breaths, Lt axillary and Rt inguinal nodes, anterior inter-axillary and Rt axillo-inguinal anastomosis,  then focused on Rt medial and lateral breast with pt permission. Instructed pt in self MLD techniques and had perform all briefly and issued handout. Performed lower trunk rotations with arms extended and goal post with mild stretch. Performed UT and levator scap stretch x 3 ea with 10 sec hold but forgot to give pictures  08/03/22: Manual Therapy MLD: In Supine short neck, 5 diaphragmatic breaths, Lt axillary and Rt inguinal nodes, anterior inter-axillary and Rt axillo-inguinal anastomosis, then focused on Rt lateral breast with pt permission.  MFR along  Rt upper arm to axilla where tightness palpable but no cording palpated today Scap mobs in Lt S/L into protraction and retraction, depression applied throughout P/ROM of Rt shoulder P/ROM of Rt shoulder into flexion, abd and D2 to pts available end motions STM to Rt scapula and upper trap with trigger point release at medial scapula border with cocoa butter and UT  07/27/22: Manual Therapy MLD: In Supine short neck, 5 diaphragmatic breaths, Lt axillary and Rt inguinal nodes, anterior inter-axillary and Rt axillo-inguinal anastomosis, then focused on Rt breast MFR along Rt upper arm to axilla where tightness palpable but no cording palpated today Scap mobs in Lt S/L into protraction and retraction, tight initally but loosened up after mobs; also scapular depression applied throughout P/ROM of Rt shoulder P/ROM of Rt shoulder into flexion, abd and D2 to pts available end motions STM to Rt scapula and upper trap with trigger point release at medial scapula border with cocoa butter  07/13/22: myofascial release to cording from R axilla down upper arm then PROM to RUE in direction of flexion and abduction with marked improvements noted by end of session   PATIENT EDUCATION:  Education details: post op exercises, axillary cording, truncal stretch Person educated: Patient Education method: Consulting civil engineer, Demonstration, Verbal cues, and Handouts Education  comprehension: verbalized understanding     HOME EXERCISE PROGRAM: Post op exercises   ASSESSMENT:   CLINICAL IMPRESSION: Pts cording is much improved since evaluation with return to baseline AROM and no cording visible.  Continued with manual therapy working on areas that are still tight in her upper quadrant and axilla. Pt will benefit from further therapy to address strengthening  and possibly dry needling to trigger point areas. She will also benefit from review of self MLD to the right breast.     OBJECTIVE IMPAIRMENTS decreased ROM, increased edema, increased fascial restrictions, increased muscle spasms, impaired UE functional use, and postural dysfunction.    ACTIVITY LIMITATIONS lifting and reach over head   PARTICIPATION LIMITATIONS:  none   PERSONAL FACTORS  none  are also affecting patient's functional outcome.    REHAB POTENTIAL: Excellent   CLINICAL DECISION MAKING: Stable/uncomplicated   EVALUATION COMPLEXITY: Low   GOALS: Goals reviewed with patient? Yes   SHORT TERM GOALS=LONG TERM GOALS  Target date: 08/10/2022     Pt will demonstrate 160 degrees of R shoulder flexion to allow her to reach overhead.  Baseline: 122 Goal status: MET   2.  Pt will demonstrate 165 degrees of L shoulder abduction to allow her to reach up and out to the side.  Baseline: 96 Goal status: MET   3.  Pt will report she is no longer having rib pain to allow her improved comfort.  Baseline:  Goal status: MET   4.  Pt will not demonstrate any increase in pore size in inferior breast to decrease risk of cellulitis.  Baseline:  Goal status:progressing   5.  Pt will be independent in a home exercise program for continued stretching and strengthening.  Baseline:  Goal status: progressing   PLAN: PT FREQUENCY: 1-2 x/week   PT DURATION: 2-4 weeks   PLANNED INTERVENTIONS: Therapeutic exercises, Therapeutic activity, Patient/Family education, Self Care, Orthotic/Fit training, Manual  lymph drainage, Compression bandaging, scar mobilization, Taping, Vasopneumatic device, and Manual therapy, dry needling   PLAN FOR NEXT SESSION: STM Rt upper quadrant, PROM,  MLD to R breast and axilla and review with pt., gave script for compression bra and pt may purchase if insurance  covers, try supine scap or similar, consider Dry needling for UT/scap trigger points (someone had mentioned this to pt) cervical ROM;give pics of stretches    Claris Pong, PT 08/10/2022, 5:33 PM

## 2022-08-11 ENCOUNTER — Ambulatory Visit: Payer: 59

## 2022-08-12 ENCOUNTER — Inpatient Hospital Stay: Payer: 59

## 2022-08-12 ENCOUNTER — Inpatient Hospital Stay: Payer: 59 | Admitting: Hematology and Oncology

## 2022-08-12 ENCOUNTER — Ambulatory Visit
Admission: RE | Admit: 2022-08-12 | Discharge: 2022-08-12 | Disposition: A | Discharge status: Home / Self Care | Payer: 59 | Source: Ambulatory Visit | Attending: Radiation Oncology | Admitting: Radiation Oncology

## 2022-08-12 ENCOUNTER — Other Ambulatory Visit: Payer: Self-pay

## 2022-08-12 DIAGNOSIS — Z5112 Encounter for antineoplastic immunotherapy: Secondary | ICD-10-CM | POA: Diagnosis not present

## 2022-08-12 LAB — RAD ONC ARIA SESSION SUMMARY
Course Elapsed Days: 16
Plan Fractions Treated to Date: 11
Plan Fractions Treated to Date: 11
Plan Prescribed Dose Per Fraction: 1.8 Gy
Plan Prescribed Dose Per Fraction: 1.8 Gy
Plan Total Fractions Prescribed: 27
Plan Total Fractions Prescribed: 27
Plan Total Prescribed Dose: 48.6 Gy
Plan Total Prescribed Dose: 48.6 Gy
Reference Point Dosage Given to Date: 21.6 Gy
Reference Point Dosage Given to Date: 21.6 Gy
Reference Point Session Dosage Given: 1.8 Gy
Reference Point Session Dosage Given: 1.8 Gy
Session Number: 12

## 2022-08-13 ENCOUNTER — Ambulatory Visit: Payer: 59

## 2022-08-15 NOTE — Progress Notes (Signed)
Patient Care Team: Dois Davenport, MD as PCP - General (Family Medicine) Pershing Proud, RN as Oncology Nurse Navigator Donnelly Angelica, RN as Oncology Nurse Navigator Serena Croissant, MD as Consulting Physician (Hematology and Oncology)  DIAGNOSIS: No diagnosis found.  SUMMARY OF ONCOLOGIC HISTORY: Oncology History  Malignant neoplasm of upper-outer quadrant of right breast in female, estrogen receptor negative (HCC)  11/04/2021 Initial Diagnosis    Patient has been breast-feeding.  Felt a painful right breast mass and an axillary mass.  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 with a Ki-67 of 40%, 1 abnormal lymph node 5.8 cm biopsy: Grade 3 IDC ER 5%, PR 0%, HER2 negative, Ki-67 40%   11/11/2021 Cancer Staging   Staging form: Breast, AJCC 8th Edition - Clinical stage from 11/11/2021: Stage IIIA (cT3, cN1, cM0, G3, ER+, PR+, HER2-) - Signed by Serena Croissant, MD on 02/18/2022 Stage prefix: Initial diagnosis Histologic grading system: 3 grade system   11/30/2021 - 04/29/2022 Chemotherapy   Patient is on Treatment Plan : BREAST Pembrolizumab + Carboplatin D1 + Paclitaxel D1,8,15 q21d X 4 cycles / Pembrolizumab + AC q21d x 4 cycles     11/30/2021 Genetic Testing   Negative genetic testing on the CancerNext-Expanded+RNA insight panel.  The report date is 11/30/2021  The CancerNext-Expanded gene panel offered by [facility] and includes sequencing and rearrangement analysis for the following 77 genes: AIP, ALK, APC*, ATM*, AXIN2, BAP1, BARD1, BLM, BMPR1A, BRCA1*, BRCA2*, BRIP1*, CDC73, CDH1*, CDK4, CDKN1B, CDKN2A, CHEK2*, CTNNA1, DICER1, FANCC, FH, FLCN, GALNT12, KIF1B, LZTR1, MAX, MEN1, MET, MLH1*, MSH2*, MSH3, MSH6*, MUTYH*, NBN, NF1*, NF2, NTHL1, PALB2*, PHOX2B, PMS2*, POT1, PRKAR1A, PTCH1, PTEN*, RAD51C*, RAD51D*, RB1, RECQL, RET, SDHA, SDHAF2, SDHB, SDHC, SDHD, SMAD4, SMARCA4, SMARCB1, SMARCE1, STK11, SUFU, TMEM127, TP53*, TSC1, TSC2, VHL and  XRCC2 (sequencing and deletion/duplication); EGFR, EGLN1, HOXB13, KIT, MITF, PDGFRA, POLD1, and POLE (sequencing only); EPCAM and GREM1 (deletion/duplication only). DNA and RNA analyses performed for * genes.    05/18/2022 -  Chemotherapy   Patient is on Treatment Plan : BREAST Pembrolizumab (200) q21d x 27 weeks     06/18/2022 Surgery   Right lumpectomy: No residual invasive or in situ carcinoma.  0/5 lymph nodes     CHIEF COMPLIANT: Discuss synthroid dose  INTERVAL HISTORY: Sydney Rios is a 41 y.o with the above mention. She presents to the clinic today for a follow-up to discuss synthroid dose.   ALLERGIES:  has No Known Allergies.  MEDICATIONS:  Current Outpatient Medications  Medication Sig Dispense Refill   acyclovir ointment (ZOVIRAX) 5 % 1 APPLICATION TOPICALLY TO AFFECTED AREA 6 TIMES PER DAY EVERY 3 HOURS     Ascorbic Acid (VITAMIN C) 1000 MG tablet Take 2,000 mg by mouth every 30 (thirty) days.     cholecalciferol (VITAMIN D3) 25 MCG (1000 UNIT) tablet Take 10,000 Units by mouth 3 (three) times a week.     clindamycin (CLINDAGEL) 1 % gel Apply topically 2 (two) times daily. 30 g 0   clindamycin (CLINDAGEL) 1 % gel Apply topically.     griseofulvin (GRIFULVIN V) 500 MG tablet      IVERMECTIN PO Take 20 mg by mouth in the morning.     levothyroxine (SYNTHROID) 150 MCG tablet TAKE 1 TABLET BY MOUTH EVERY DAY BEFORE BREAKFAST 30 tablet 0   lidocaine-prilocaine (EMLA) cream APPLY TO AFFECTED AREA ONCE AS DIRECTED     Menaquinone-7 (VITAMIN K2 PO) Take 1  tablet by mouth daily.     Naltrexone HCl POWD Take by mouth.     ondansetron (ZOFRAN) 8 MG tablet Take by mouth.     Pectin Cit-Inos-C-Bioflav-Soy (MODIFIED CITRUS PECTIN PO) Take 1 tablet by mouth daily.     tretinoin (RETIN-A) 0.025 % cream SMARTSIG:sparingly Topical Every Night     valACYclovir (VALTREX) 1000 MG tablet Take 1 tablet (1,000 mg total) by mouth 3 (three) times daily. 30 tablet 0   No current  facility-administered medications for this visit.    PHYSICAL EXAMINATION: ECOG PERFORMANCE STATUS: {CHL ONC ECOG PS:[phone]}  There were no vitals filed for this visit. There were no vitals filed for this visit.  BREAST:*** No palpable masses or nodules in either right or left breasts. No palpable axillary supraclavicular or infraclavicular adenopathy no breast tenderness or nipple discharge. (exam performed in the presence of a chaperone)  LABORATORY DATA:  I have reviewed the data as listed    Latest Ref Rng & Units 07/22/2022    1:58 PM 06/30/2022   12:27 PM 06/08/2022   12:23 PM  CMP  Glucose 70 - 99 mg/dL 171  163  101   BUN 6 - 20 mg/dL $Remove'17  15  12   'KEmgXwO$ Creatinine 0.44 - 1.00 mg/dL 0.83  0.83  0.67   Sodium 135 - 145 mmol/L 138  139  141   Potassium 3.5 - 5.1 mmol/L 3.9  4.3  3.9   Chloride 98 - 111 mmol/L 108  107  108   CO2 22 - 32 mmol/L $RemoveB'26  29  28   'OnATrVfw$ Calcium 8.9 - 10.3 mg/dL 9.1  9.6  10.1   Total Protein 6.5 - 8.1 g/dL 6.7  6.7  6.9   Total Bilirubin 0.3 - 1.2 mg/dL 0.3  0.3  0.3   Alkaline Phos 38 - 126 U/L 56  54  53   AST 15 - 41 U/L $Remo'12  13  20   'UoeIk$ ALT 0 - 44 U/L $Remo'9  10  17     'KVEIH$ Lab Results  Component Value Date   WBC 3.2 (L) 07/22/2022   HGB 10.7 (L) 07/22/2022   HCT 32.2 (L) 07/22/2022   MCV 93.9 07/22/2022   PLT 167 07/22/2022   NEUTROABS 2.1 07/22/2022    ASSESSMENT & PLAN:  No problem-specific Assessment & Plan notes found for this encounter.    No orders of the defined types were placed in this encounter.  The patient has a good understanding of the overall plan. she agrees with it. she will call with any problems that may develop before the next visit here. Total time spent: 30 mins including face to face time and time spent for planning, charting and co-ordination of care   Suzzette Righter, [place] 08/15/22    I Gardiner Coins am scribing for Dr. Lindi Adie  ***

## 2022-08-16 ENCOUNTER — Ambulatory Visit
Admission: RE | Admit: 2022-08-16 | Discharge: 2022-08-16 | Disposition: A | Discharge status: Home / Self Care | Payer: 59 | Source: Ambulatory Visit | Attending: Radiation Oncology | Admitting: Radiation Oncology

## 2022-08-16 ENCOUNTER — Other Ambulatory Visit: Payer: Self-pay

## 2022-08-16 DIAGNOSIS — Z5112 Encounter for antineoplastic immunotherapy: Secondary | ICD-10-CM | POA: Diagnosis not present

## 2022-08-16 LAB — RAD ONC ARIA SESSION SUMMARY
Course Elapsed Days: 20
Plan Fractions Treated to Date: 12
Plan Fractions Treated to Date: 12
Plan Prescribed Dose Per Fraction: 1.8 Gy
Plan Prescribed Dose Per Fraction: 1.8 Gy
Plan Total Fractions Prescribed: 27
Plan Total Fractions Prescribed: 27
Plan Total Prescribed Dose: 48.6 Gy
Plan Total Prescribed Dose: 48.6 Gy
Reference Point Dosage Given to Date: 23.4 Gy
Reference Point Dosage Given to Date: 23.4 Gy
Reference Point Session Dosage Given: 1.8 Gy
Reference Point Session Dosage Given: 1.8 Gy
Session Number: 13

## 2022-08-17 ENCOUNTER — Other Ambulatory Visit: Payer: Self-pay

## 2022-08-17 ENCOUNTER — Ambulatory Visit: Payer: 59 | Admitting: Physical Therapy

## 2022-08-17 ENCOUNTER — Encounter: Payer: Self-pay | Admitting: Physical Therapy

## 2022-08-17 ENCOUNTER — Ambulatory Visit
Admission: RE | Admit: 2022-08-17 | Discharge: 2022-08-17 | Disposition: A | Discharge status: Home / Self Care | Payer: 59 | Source: Ambulatory Visit | Attending: Radiation Oncology | Admitting: Radiation Oncology

## 2022-08-17 DIAGNOSIS — M25611 Stiffness of right shoulder, not elsewhere classified: Secondary | ICD-10-CM

## 2022-08-17 DIAGNOSIS — Z5112 Encounter for antineoplastic immunotherapy: Secondary | ICD-10-CM | POA: Diagnosis not present

## 2022-08-17 DIAGNOSIS — R293 Abnormal posture: Secondary | ICD-10-CM

## 2022-08-17 DIAGNOSIS — C50211 Malignant neoplasm of upper-inner quadrant of right female breast: Secondary | ICD-10-CM

## 2022-08-17 DIAGNOSIS — Z483 Aftercare following surgery for neoplasm: Secondary | ICD-10-CM

## 2022-08-17 DIAGNOSIS — R600 Localized edema: Secondary | ICD-10-CM

## 2022-08-17 LAB — RAD ONC ARIA SESSION SUMMARY
Course Elapsed Days: 21
Plan Fractions Treated to Date: 13
Plan Fractions Treated to Date: 13
Plan Prescribed Dose Per Fraction: 1.8 Gy
Plan Prescribed Dose Per Fraction: 1.8 Gy
Plan Total Fractions Prescribed: 27
Plan Total Fractions Prescribed: 27
Plan Total Prescribed Dose: 48.6 Gy
Plan Total Prescribed Dose: 48.6 Gy
Reference Point Dosage Given to Date: 25.2 Gy
Reference Point Dosage Given to Date: 25.2 Gy
Reference Point Session Dosage Given: 1.8 Gy
Reference Point Session Dosage Given: 1.8 Gy
Session Number: 14

## 2022-08-17 NOTE — Therapy (Signed)
OUTPATIENT PHYSICAL THERAPY TREATMENT NOTE   Patient Name: Sydney Rios MRN: [id number] DOB:Feb 11, 1981, 41 y.o., female Today's Date: 08/17/2022  PCP:  Horald Pollen, MD REFERRING PROVIDER: Nicholas Lose, MD  END OF SESSION:   PT End of Session - 08/17/22 1108     Visit Number 5    Number of Visits 12    Date for PT Re-Evaluation 09/07/22    PT Start Time 1107    PT Stop Time 5945    PT Time Calculation (min) 49 min    Activity Tolerance Patient tolerated treatment well    Behavior During Therapy WFL for tasks assessed/performed             Past Medical History:  Diagnosis Date   Anxiety    Chronic kidney disease    hx kidney stones   Complication of anesthesia    runs heart rate in the 30-40s in PACU   History of kidney stones    Hx gestational diabetes    Hx of Hashimoto thyroiditis    PONV (postoperative nausea and vomiting)    Past Surgical History:  Procedure Laterality Date   CESAREAN SECTION     CESAREAN SECTION  01/23/2013   Procedure: CESAREAN SECTION;  Surgeon: Luz Lex, MD;  Location: [facility];  Service: Obstetrics;  Laterality: N/A;   CESAREAN SECTION N/A 10/11/2014   Procedure: CESAREAN SECTION;  Surgeon: Cyril Mourning, MD;  Location: [facility];  Service: Obstetrics;  Laterality: N/A;   CESAREAN SECTION N/A 10/24/2018   Procedure: REPEAT CESAREAN SECTION;  Surgeon: Dian Queen, MD;  Location: [facility];  Service: Obstetrics;  Laterality: N/A;  Tracey RNFA   LITHOTRIPSY     PORTACATH PLACEMENT Left 11/25/2021   Procedure: PORT PLACEMENT;  Surgeon: Rolm Bookbinder, MD;  Location: [facility];  Service: General;  Laterality: Left;   WISDOM TOOTH EXTRACTION     Patient Active Problem List   Diagnosis Date Noted   Genetic testing 12/01/2021   Port-A-Cath in place 11/30/2021   Malignant neoplasm of upper-outer quadrant of right breast in female, estrogen receptor negative ([place]) 11/11/2021   Abnormal maternal glucose tolerance,  antepartum 09/15/2018   S/P cesarean section 10/11/2014   TACHYCARDIA 05/01/2010    REFERRING DIAG: C50.411,Z17.1 (ICD-10-CM) - Malignant neoplasm of upper-outer quadrant of right breast in female, estrogen receptor negative ([place])  THERAPY DIAG:  Stiffness of right shoulder, not elsewhere classified  Aftercare following surgery for neoplasm  Localized edema  Abnormal posture  Malignant neoplasm of upper-inner quadrant of right breast in female, estrogen receptor negative ([place])  Rationale for Evaluation and Treatment Rehabilitation  PERTINENT HISTORY: Patient was diagnosed on 11/05/2021 with right grade III invasive ductal carcinoma breast cancer. She has a large axillary lymph node that was biopsied and positive for disease. It is triple negative with a Ki67 of 40%. Completed chemo 11/30/21-04/29/22. 06/18/22- R lumpectomy 0/5 lymph nodes  PRECAUTIONS:  Other: at risk for lymphedema on R side  SUBJECTIVE: I was having some pain in my shoulder last night. I am still having some swelling.     PAIN:  Are you having pain? Yes, Neck and right UT PAIN:  Are you having pain? Yes NPRS scale: 4-5/10 Pain location: right UT/neck Pain orientation: Right  PAIN TYPE: aching and tight Pain description: constant and aching  Aggravating factors: sleeping, driving Relieving factors: release techniques   OBJECTIVE: (objective measures completed at initial evaluation unless otherwise dated)   PALPATION: Some increased scar tissue/seroma palpable in  R axilla   OBSERVATIONS / OTHER ASSESSMENTS: some swelling present on inferior breast as evidenced by increased pore size   POSTURE: forward head, rounded shoulders   UPPER EXTREMITY AROM/PROM:   A/PROM RIGHT   eval   08/03/22 08/10/2022  Shoulder extension 62  67  Shoulder flexion 122 167 - axilla pull 168 mild pull  Shoulder abduction 96 175 - no pull  180  Shoulder internal rotation 63    Shoulder external rotation 88  110                           (Blank rows = not tested)   A/PROM LEFT   eval  Shoulder extension 68  Shoulder flexion 160  Shoulder abduction 175  Shoulder internal rotation 63  Shoulder external rotation 87                          (Blank rows = not tested)       LYMPHEDEMA ASSESSMENTS:   SURGERY TYPE/DATE: R lumpectomy and SLNB 06/18/22  NUMBER OF LYMPH NODES REMOVED: 5 - all negative  CHEMOTHERAPY: completed and currently on Keytruda  RADIATION:will need in the future  HORMONE TREATMENT: pt does not require  INFECTIONS: none   LYMPHEDEMA ASSESSMENTS:    LANDMARK RIGHT  eval  10 cm proximal to olecranon process 26.5  Olecranon process 24.5  10 cm proximal to ulnar styloid process 19.1  Just proximal to ulnar styloid process 14.7  Across hand at thumb web space 18.6  At base of 2nd digit 5.6  (Blank rows = not tested)   LANDMARK LEFT  eval  10 cm proximal to olecranon process 26.2  Olecranon process 25  10 cm proximal to ulnar styloid process 19.3  Just proximal to ulnar styloid process 14.6  Across hand at thumb web space 18.5  At base of 2nd digit 5.5  (Blank rows = not tested)     QUICK DASH SURVEY: 16%              TODAY'S TREATMENT  08/17/22 Soft tissue massage performed to right UT,  levator, and rhomboids with cocoa butter with increased muscle tightness noted and removed with alcohol wipes prior to going to radiation MLD: In Supine short neck, 5 diaphragmatic breaths, Lt axillary and Rt inguinal nodes, anterior inter-axillary and Rt axillo-inguinal anastomosis, then focused on Rt medial and lateral breast with pt permission.  Created a home exercise program with the following stretches to help decrease tightness: lower trunk rotations with arms extended, goal post stretch, UT and levator scap stretch - educated pt in each and issued as part of HEP   08/10/2022 Discussed sports bra or compression bra and gave script for compression bra as she feels she is swelling more  with radiation. Soft tissue massage performed to right UT levator area with cocoa butter and removed with alcohol wipes prior to going to radiation MLD: In Supine short neck, 5 diaphragmatic breaths, Lt axillary and Rt inguinal nodes, anterior inter-axillary and Rt axillo-inguinal anastomosis, then focused on Rt medial and lateral breast with pt permission. Instructed pt in self MLD techniques and had perform all briefly and issued handout. Performed lower trunk rotations with arms extended and goal post with mild stretch. Performed UT and levator scap stretch x 3 ea with 10 sec hold but forgot to give pictures  08/03/22: Manual Therapy MLD: In Supine short neck, 5 diaphragmatic breaths,  Lt axillary and Rt inguinal nodes, anterior inter-axillary and Rt axillo-inguinal anastomosis, then focused on Rt lateral breast with pt permission.  MFR along Rt upper arm to axilla where tightness palpable but no cording palpated today Scap mobs in Lt S/L into protraction and retraction, depression applied throughout P/ROM of Rt shoulder P/ROM of Rt shoulder into flexion, abd and D2 to pts available end motions STM to Rt scapula and upper trap with trigger point release at medial scapula border with cocoa butter and UT  07/27/22: Manual Therapy MLD: In Supine short neck, 5 diaphragmatic breaths, Lt axillary and Rt inguinal nodes, anterior inter-axillary and Rt axillo-inguinal anastomosis, then focused on Rt breast MFR along Rt upper arm to axilla where tightness palpable but no cording palpated today Scap mobs in Lt S/L into protraction and retraction, tight initally but loosened up after mobs; also scapular depression applied throughout P/ROM of Rt shoulder P/ROM of Rt shoulder into flexion, abd and D2 to pts available end motions STM to Rt scapula and upper trap with trigger point release at medial scapula border with cocoa butter  07/13/22: myofascial release to cording from R axilla down upper arm then PROM to  RUE in direction of flexion and abduction with marked improvements noted by end of session   PATIENT EDUCATION:  Education details: post op exercises, axillary cording, truncal stretch Person educated: Patient Education method: Explanation, Demonstration, Verbal cues, and Handouts Education comprehension: verbalized understanding     HOME EXERCISE PROGRAM: Post op exercises  Access Code: [email or web address] URL: [email or web address] Date: 08/17/2022 Prepared by: Manus Gunning  Exercises - Supine Shoulder External Rotation Stretch  - 1 x daily - 7 x weekly - 1 sets - 5-10 reps - 15-30 seconds hold - Supine Lower Trunk Rotation  - 1 x daily - 7 x weekly - 1 sets - 5-10 reps - 15-30 sec  hold - Seated Upper Trap Stretch  - 1 x daily - 7 x weekly - 1 sets - 5-10 reps - 15-30 hold - Seated Levator Scapulae Stretch (Mirrored)  - 1 x daily - 7 x weekly - 1 sets - 5-10 reps - 15-30 hold  ASSESSMENT:   CLINICAL IMPRESSION: Issued HEP with stretches for pt's shoulder and pecs to help decrease tightness. Continued with soft tissue mobilization to help decrease tightness in posterior shoulder. Performed MLD to R breast to help decrease edema in inferior and lateral breast.      OBJECTIVE IMPAIRMENTS decreased ROM, increased edema, increased fascial restrictions, increased muscle spasms, impaired UE functional use, and postural dysfunction.    ACTIVITY LIMITATIONS lifting and reach over head   PARTICIPATION LIMITATIONS:  none   PERSONAL FACTORS  none  are also affecting patient's functional outcome.    REHAB POTENTIAL: Excellent   CLINICAL DECISION MAKING: Stable/uncomplicated   EVALUATION COMPLEXITY: Low   GOALS: Goals reviewed with patient? Yes   SHORT TERM GOALS=LONG TERM GOALS  Target date: 08/10/2022     Pt will demonstrate 160 degrees of R shoulder flexion to allow her to reach overhead.  Baseline: 122 Goal status: MET   2.  Pt will demonstrate 165 degrees of L  shoulder abduction to allow her to reach up and out to the side.  Baseline: 96 Goal status: MET   3.  Pt will report she is no longer having rib pain to allow her improved comfort.  Baseline:  Goal status: MET   4.  Pt will not demonstrate any increase in pore size in inferior  breast to decrease risk of cellulitis.  Baseline:  Goal status:progressing   5.  Pt will be independent in a home exercise program for continued stretching and strengthening.  Baseline:  Goal status: progressing   PLAN: PT FREQUENCY: 1-2 x/week   PT DURATION: 2-4 weeks   PLANNED INTERVENTIONS: Therapeutic exercises, Therapeutic activity, Patient/Family education, Self Care, Orthotic/Fit training, Manual lymph drainage, Compression bandaging, scar mobilization, Taping, Vasopneumatic device, and Manual therapy, dry needling   PLAN FOR NEXT SESSION: STM Rt upper quadrant, PROM,  MLD to R breast and axilla and review with pt., gave script for compression bra and pt may purchase if insurance covers, try supine scap or similar, consider Dry needling for UT/scap trigger points (someone had mentioned this to pt) cervical ROM;give pics of stretches    [employer], PT 08/17/2022, 12:04 PM

## 2022-08-18 ENCOUNTER — Other Ambulatory Visit: Payer: Self-pay

## 2022-08-18 ENCOUNTER — Ambulatory Visit
Admission: RE | Admit: 2022-08-18 | Discharge: 2022-08-18 | Disposition: A | Discharge status: Home / Self Care | Payer: 59 | Source: Ambulatory Visit | Attending: Radiation Oncology | Admitting: Radiation Oncology

## 2022-08-18 ENCOUNTER — Inpatient Hospital Stay: Payer: 59 | Attending: Licensed Clinical Social Worker

## 2022-08-18 DIAGNOSIS — E039 Hypothyroidism, unspecified: Secondary | ICD-10-CM | POA: Insufficient documentation

## 2022-08-18 DIAGNOSIS — Z5112 Encounter for antineoplastic immunotherapy: Secondary | ICD-10-CM | POA: Diagnosis not present

## 2022-08-18 DIAGNOSIS — C50411 Malignant neoplasm of upper-outer quadrant of right female breast: Secondary | ICD-10-CM | POA: Insufficient documentation

## 2022-08-18 DIAGNOSIS — Z171 Estrogen receptor negative status [ER-]: Secondary | ICD-10-CM | POA: Insufficient documentation

## 2022-08-18 DIAGNOSIS — Z452 Encounter for adjustment and management of vascular access device: Secondary | ICD-10-CM | POA: Insufficient documentation

## 2022-08-18 DIAGNOSIS — Z79890 Hormone replacement therapy (postmenopausal): Secondary | ICD-10-CM | POA: Insufficient documentation

## 2022-08-18 DIAGNOSIS — Z95828 Presence of other vascular implants and grafts: Secondary | ICD-10-CM

## 2022-08-18 DIAGNOSIS — D72819 Decreased white blood cell count, unspecified: Secondary | ICD-10-CM | POA: Insufficient documentation

## 2022-08-18 LAB — CMP (CANCER CENTER ONLY)
ALT: 14 U/L (ref 0–44)
AST: 17 U/L (ref 15–41)
Albumin: 4.1 g/dL (ref 3.5–5.0)
Alkaline Phosphatase: 46 U/L (ref 38–126)
Anion gap: 6 (ref 5–15)
BUN: 14 mg/dL (ref 6–20)
CO2: 24 mmol/L (ref 22–32)
Calcium: 9.6 mg/dL (ref 8.9–10.3)
Chloride: 112 mmol/L — ABNORMAL HIGH (ref 98–111)
Creatinine: 0.84 mg/dL (ref 0.44–1.00)
GFR, Estimated: >60 mL/min (ref >60)
Glucose, Bld: 106 mg/dL — ABNORMAL HIGH (ref 70–99)
Potassium: 3.9 mmol/L (ref 3.5–5.1)
Sodium: 142 mmol/L (ref 135–145)
Total Bilirubin: 0.2 mg/dL — ABNORMAL LOW (ref 0.3–1.2)
Total Protein: 6.6 g/dL (ref 6.5–8.1)

## 2022-08-18 LAB — RAD ONC ARIA SESSION SUMMARY
Course Elapsed Days: 22
Plan Fractions Treated to Date: 14
Plan Fractions Treated to Date: 14
Plan Prescribed Dose Per Fraction: 1.8 Gy
Plan Prescribed Dose Per Fraction: 1.8 Gy
Plan Total Fractions Prescribed: 27
Plan Total Fractions Prescribed: 27
Plan Total Prescribed Dose: 48.6 Gy
Plan Total Prescribed Dose: 48.6 Gy
Reference Point Dosage Given to Date: 27 Gy
Reference Point Dosage Given to Date: 27 Gy
Reference Point Session Dosage Given: 1.8 Gy
Reference Point Session Dosage Given: 1.8 Gy
Session Number: 15

## 2022-08-18 LAB — CBC WITH DIFFERENTIAL (CANCER CENTER ONLY)
Abs Immature Granulocytes: 0 10*3/uL (ref 0.00–0.07)
Basophils Absolute: 0 10*3/uL (ref 0.0–0.1)
Basophils Relative: 0 %
Eosinophils Absolute: 0.1 10*3/uL (ref 0.0–0.5)
Eosinophils Relative: 2 %
HCT: 30.1 % — ABNORMAL LOW (ref 36.0–46.0)
Hemoglobin: 10.4 g/dL — ABNORMAL LOW (ref 12.0–15.0)
Immature Granulocytes: 0 %
Lymphocytes Relative: 16 %
Lymphs Abs: 0.5 10*3/uL — ABNORMAL LOW (ref 0.7–4.0)
MCH: 31.4 pg (ref 26.0–34.0)
MCHC: 34.6 g/dL (ref 30.0–36.0)
MCV: 90.9 fL (ref 80.0–100.0)
Monocytes Absolute: 0.3 10*3/uL (ref 0.1–1.0)
Monocytes Relative: 10 %
Neutro Abs: 2 10*3/uL (ref 1.7–7.7)
Neutrophils Relative %: 72 %
Platelet Count: 174 10*3/uL (ref 150–400)
RBC: 3.31 MIL/uL — ABNORMAL LOW (ref 3.87–5.11)
RDW: 13.9 % (ref 11.5–15.5)
WBC Count: 2.8 10*3/uL — ABNORMAL LOW (ref 4.0–10.5)
nRBC: 0 % (ref 0.0–0.2)

## 2022-08-18 LAB — TSH: TSH: 3.131 u[IU]/mL (ref 0.350–4.500)

## 2022-08-18 MED ORDER — SODIUM CHLORIDE 0.9% FLUSH
10.0000 mL | Freq: Once | INTRAVENOUS | Status: AC
  Administered 2022-08-18: 10 mL

## 2022-08-18 MED ORDER — HEPARIN SOD (PORK) LOCK FLUSH 100 UNIT/ML IV SOLN
500.0000 [IU] | Freq: Once | INTRAVENOUS | Status: AC
  Administered 2022-08-18: 500 [IU]

## 2022-08-19 ENCOUNTER — Inpatient Hospital Stay: Payer: 59

## 2022-08-19 ENCOUNTER — Inpatient Hospital Stay (HOSPITAL_BASED_OUTPATIENT_CLINIC_OR_DEPARTMENT_OTHER): Payer: 59 | Admitting: Hematology and Oncology

## 2022-08-19 ENCOUNTER — Ambulatory Visit
Admission: RE | Admit: 2022-08-19 | Discharge: 2022-08-19 | Disposition: A | Discharge status: Home / Self Care | Payer: 59 | Source: Ambulatory Visit | Attending: Radiation Oncology | Admitting: Radiation Oncology

## 2022-08-19 ENCOUNTER — Encounter: Payer: Self-pay | Admitting: Hematology and Oncology

## 2022-08-19 ENCOUNTER — Other Ambulatory Visit: Payer: Self-pay | Admitting: *Deleted

## 2022-08-19 ENCOUNTER — Other Ambulatory Visit: Payer: Self-pay

## 2022-08-19 DIAGNOSIS — C50411 Malignant neoplasm of upper-outer quadrant of right female breast: Secondary | ICD-10-CM

## 2022-08-19 DIAGNOSIS — Z171 Estrogen receptor negative status [ER-]: Secondary | ICD-10-CM | POA: Diagnosis not present

## 2022-08-19 DIAGNOSIS — Z5112 Encounter for antineoplastic immunotherapy: Secondary | ICD-10-CM | POA: Diagnosis not present

## 2022-08-19 LAB — RAD ONC ARIA SESSION SUMMARY
Course Elapsed Days: 23
Plan Fractions Treated to Date: 15
Plan Fractions Treated to Date: 15
Plan Prescribed Dose Per Fraction: 1.8 Gy
Plan Prescribed Dose Per Fraction: 1.8 Gy
Plan Total Fractions Prescribed: 27
Plan Total Fractions Prescribed: 27
Plan Total Prescribed Dose: 48.6 Gy
Plan Total Prescribed Dose: 48.6 Gy
Reference Point Dosage Given to Date: 28.8 Gy
Reference Point Dosage Given to Date: 28.8 Gy
Reference Point Session Dosage Given: 1.8 Gy
Reference Point Session Dosage Given: 1.8 Gy
Session Number: 16

## 2022-08-19 LAB — T4: T4, Total: 6.9 ug/dL (ref 4.5–12.0)

## 2022-08-19 MED ORDER — SODIUM CHLORIDE 0.9 % IV SOLN: Freq: Once | INTRAVENOUS | Status: AC

## 2022-08-19 MED ORDER — SODIUM CHLORIDE 0.9 % IV SOLN
200.0000 mg | Freq: Once | INTRAVENOUS | Status: AC
  Administered 2022-08-19: 200 mg via INTRAVENOUS
  Filled 2022-08-19: qty 200

## 2022-08-19 MED ORDER — HEPARIN SOD (PORK) LOCK FLUSH 100 UNIT/ML IV SOLN
500.0000 [IU] | Freq: Once | INTRAVENOUS | Status: AC | PRN
  Administered 2022-08-19: 500 [IU]

## 2022-08-19 MED ORDER — SODIUM CHLORIDE 0.9% FLUSH
10.0000 mL | INTRAVENOUS | Status: DC | PRN
  Administered 2022-08-19: 10 mL

## 2022-08-19 NOTE — Progress Notes (Signed)
Verbal orders received by MD to obtain restaging CT CAP in November 2023.  Orders placed.

## 2022-08-19 NOTE — Assessment & Plan Note (Addendum)
11/04/2021:Patient has been breast-feeding. Felt a painful right breast mass and an axillary mass. 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 Neoadjuvant chemotherapy with Taxol weekly 12 withKeytruda andcarboplatin every 3 weeks x4 Adriamycin and Cytoxan, Keytrudadose dense 4 followed by followed by Beryle Flock maintenance for 1 year 2. 06/18/2022:Right lumpectomy (at [facility]): No residual invasive or in situ carcinoma.  0/5 lymph nodes 3. Followed by adjuvant radiation therapy --------------------------------------------------------------------------------------------------------------------------- Current Treatment:Keytruda maintenance (Until end of November 2023) Shingles: Treated with Valtrex Keytruda toxicities: None  She is going on a beach vacation in September and is looking forward to that.  This was a complementary vacation provided by [employer] [place]. Return to clinic every 3 weeks for [facility]

## 2022-08-19 NOTE — Patient Instructions (Signed)
[facility] MEDICAL [facility]COLOGY  Discharge Instructions: Thank you for choosing [facility] to provide your oncology and hematology care.   If you have a lab appointment with the Cancer Center, please go directly to the Cancer Center and check in at the registration area.   Wear comfortable clothing and clothing appropriate for easy access to any Portacath or PICC line.   We strive to give you quality time with your provider. You may need to reschedule your appointment if you arrive late (15 or more minutes).  Arriving late affects you and other patients whose appointments are after yours.  Also, if you miss three or more appointments without notifying the office, you may be dismissed from the clinic at the provider's discretion.      For prescription refill requests, have your pharmacy contact our office and allow 72 hours for refills to be completed.    Today you received the following chemotherapy and/or immunotherapy agents: Keytruda.       To help prevent nausea and vomiting after your treatment, we encourage you to take your nausea medication as directed.  BELOW ARE SYMPTOMS THAT SHOULD BE REPORTED IMMEDIATELY: *FEVER GREATER THAN 100.4 F (38 C) OR HIGHER *CHILLS OR SWEATING *NAUSEA AND VOMITING THAT IS NOT CONTROLLED WITH YOUR NAUSEA MEDICATION *UNUSUAL SHORTNESS OF BREATH *UNUSUAL BRUISING OR BLEEDING *URINARY PROBLEMS (pain or burning when urinating, or frequent urination) *BOWEL PROBLEMS (unusual diarrhea, constipation, pain near the anus) TENDERNESS IN MOUTH AND THROAT WITH OR WITHOUT PRESENCE OF ULCERS (sore throat, sores in mouth, or a toothache) UNUSUAL RASH, SWELLING OR PAIN  UNUSUAL VAGINAL DISCHARGE OR ITCHING   Items with * indicate a potential emergency and should be followed up as soon as possible or go to the Emergency Department if any problems should occur.  Please show the CHEMOTHERAPY ALERT CARD or IMMUNOTHERAPY ALERT CARD at check-in to  the Emergency Department and triage nurse.  Should you have questions after your visit or need to cancel or reschedule your appointment, please contact [facility]  Dept: [phone]  and follow the prompts.  Office hours are 8:00 a.m. to 4:30 p.m. Monday - Friday. Please note that voicemails left after 4:00 p.m. may not be returned until the following business day.  We are closed weekends and major holidays. You have access to a nurse at all times for urgent questions. Please call the main number to the clinic Dept: [phone] and follow the prompts.   For any non-urgent questions, you may also contact your provider using MyChart. We now offer e-Visits for anyone 18 and older to request care online for non-urgent symptoms. For details visit [email or web address].   Also download the MyChart app! Go to the app store, search "MyChart", open the app, select [facility], and log in with your MyChart username and password.  Masks are optional in the cancer centers. If you would like for your care team to wear a mask while they are taking care of you, please let them know. You may have one support person who is at least 40 years old accompany you for your appointments. 

## 2022-08-20 ENCOUNTER — Ambulatory Visit
Admission: RE | Admit: 2022-08-20 | Discharge: 2022-08-20 | Disposition: A | Discharge status: Home / Self Care | Payer: 59 | Source: Ambulatory Visit | Attending: Radiation Oncology | Admitting: Radiation Oncology

## 2022-08-20 ENCOUNTER — Telehealth: Payer: Self-pay | Admitting: Hematology and Oncology

## 2022-08-20 ENCOUNTER — Other Ambulatory Visit: Payer: Self-pay | Admitting: Hematology and Oncology

## 2022-08-20 ENCOUNTER — Other Ambulatory Visit: Payer: Self-pay

## 2022-08-20 DIAGNOSIS — Z5112 Encounter for antineoplastic immunotherapy: Secondary | ICD-10-CM | POA: Diagnosis not present

## 2022-08-20 LAB — RAD ONC ARIA SESSION SUMMARY
Course Elapsed Days: 24
Plan Fractions Treated to Date: 16
Plan Fractions Treated to Date: 16
Plan Prescribed Dose Per Fraction: 1.8 Gy
Plan Prescribed Dose Per Fraction: 1.8 Gy
Plan Total Fractions Prescribed: 27
Plan Total Fractions Prescribed: 27
Plan Total Prescribed Dose: 48.6 Gy
Plan Total Prescribed Dose: 48.6 Gy
Reference Point Dosage Given to Date: 30.6 Gy
Reference Point Dosage Given to Date: 30.6 Gy
Reference Point Session Dosage Given: 1.8 Gy
Reference Point Session Dosage Given: 1.8 Gy
Session Number: 17

## 2022-08-20 NOTE — Telephone Encounter (Signed)
Scheduled appointment per 8/24 los. Patient is aware.

## 2022-08-21 ENCOUNTER — Other Ambulatory Visit: Payer: Self-pay

## 2022-08-23 ENCOUNTER — Other Ambulatory Visit: Payer: Self-pay

## 2022-08-23 ENCOUNTER — Ambulatory Visit
Admission: RE | Admit: 2022-08-23 | Discharge: 2022-08-23 | Disposition: A | Discharge status: Home / Self Care | Payer: 59 | Source: Ambulatory Visit | Attending: Radiation Oncology | Admitting: Radiation Oncology

## 2022-08-23 ENCOUNTER — Ambulatory Visit: Payer: 59

## 2022-08-23 DIAGNOSIS — M25611 Stiffness of right shoulder, not elsewhere classified: Secondary | ICD-10-CM

## 2022-08-23 DIAGNOSIS — Z483 Aftercare following surgery for neoplasm: Secondary | ICD-10-CM

## 2022-08-23 DIAGNOSIS — R600 Localized edema: Secondary | ICD-10-CM

## 2022-08-23 DIAGNOSIS — Z5112 Encounter for antineoplastic immunotherapy: Secondary | ICD-10-CM | POA: Diagnosis not present

## 2022-08-23 DIAGNOSIS — R293 Abnormal posture: Secondary | ICD-10-CM

## 2022-08-23 DIAGNOSIS — C50211 Malignant neoplasm of upper-inner quadrant of right female breast: Secondary | ICD-10-CM

## 2022-08-23 LAB — RAD ONC ARIA SESSION SUMMARY
Course Elapsed Days: 27
Plan Fractions Treated to Date: 17
Plan Fractions Treated to Date: 17
Plan Prescribed Dose Per Fraction: 1.8 Gy
Plan Prescribed Dose Per Fraction: 1.8 Gy
Plan Total Fractions Prescribed: 27
Plan Total Fractions Prescribed: 27
Plan Total Prescribed Dose: 48.6 Gy
Plan Total Prescribed Dose: 48.6 Gy
Reference Point Dosage Given to Date: 32.4 Gy
Reference Point Dosage Given to Date: 32.4 Gy
Reference Point Session Dosage Given: 1.8 Gy
Reference Point Session Dosage Given: 1.8 Gy
Session Number: 18

## 2022-08-23 NOTE — Therapy (Signed)
OUTPATIENT PHYSICAL THERAPY TREATMENT NOTE   Patient Name: Sydney Rios MRN: [id number] DOB:23-Aug-1981, 41 y.o., female Today's Date: 08/23/2022  PCP:  Horald Pollen, MD REFERRING PROVIDER: Nicholas Lose, MD  END OF SESSION:   PT End of Session - 08/23/22 1312     Visit Number 6    Number of Visits 12    Date for PT Re-Evaluation 09/07/22    PT Start Time 1308    PT Stop Time 1403    PT Time Calculation (min) 55 min    Activity Tolerance Patient tolerated treatment well    Behavior During Therapy WFL for tasks assessed/performed             Past Medical History:  Diagnosis Date   Anxiety    Chronic kidney disease    hx kidney stones   Complication of anesthesia    runs heart rate in the 30-40s in PACU   History of kidney stones    Hx gestational diabetes    Hx of Hashimoto thyroiditis    PONV (postoperative nausea and vomiting)    Past Surgical History:  Procedure Laterality Date   CESAREAN SECTION     CESAREAN SECTION  01/23/2013   Procedure: CESAREAN SECTION;  Surgeon: Luz Lex, MD;  Location: [facility];  Service: Obstetrics;  Laterality: N/A;   CESAREAN SECTION N/A 10/11/2014   Procedure: CESAREAN SECTION;  Surgeon: Cyril Mourning, MD;  Location: [facility];  Service: Obstetrics;  Laterality: N/A;   CESAREAN SECTION N/A 10/24/2018   Procedure: REPEAT CESAREAN SECTION;  Surgeon: Dian Queen, MD;  Location: [facility];  Service: Obstetrics;  Laterality: N/A;  Tracey RNFA   LITHOTRIPSY     PORTACATH PLACEMENT Left 11/25/2021   Procedure: PORT PLACEMENT;  Surgeon: Rolm Bookbinder, MD;  Location: [facility];  Service: General;  Laterality: Left;   WISDOM TOOTH EXTRACTION     Patient Active Problem List   Diagnosis Date Noted   Genetic testing 12/01/2021   Port-A-Cath in place 11/30/2021   Malignant neoplasm of upper-outer quadrant of right breast in female, estrogen receptor negative ([place]) 11/11/2021   Abnormal maternal glucose tolerance,  antepartum 09/15/2018   S/P cesarean section 10/11/2014   TACHYCARDIA 05/01/2010    REFERRING DIAG: C50.411,Z17.1 (ICD-10-CM) - Malignant neoplasm of upper-outer quadrant of right breast in female, estrogen receptor negative ([place])  THERAPY DIAG:  Stiffness of right shoulder, not elsewhere classified  Aftercare following surgery for neoplasm  Localized edema  Abnormal posture  Malignant neoplasm of upper-inner quadrant of right breast in female, estrogen receptor negative ([place])  Rationale for Evaluation and Treatment Rehabilitation  PERTINENT HISTORY: Patient was diagnosed on 11/05/2021 with right grade III invasive ductal carcinoma breast cancer. She has a large axillary lymph node that was biopsied and positive for disease. It is triple negative with a Ki67 of 40%. Completed chemo 11/30/21-04/29/22. 06/18/22- R lumpectomy 0/5 lymph nodes  PRECAUTIONS:  Other: at risk for lymphedema on R side  SUBJECTIVE: My Rt breast and the side of my trunk is still a little swollen and my Rt shoulder has really been hurting. I had to get up and take an NSAID last night and then some more this morning.     PAIN:  Are you having pain? Yes, Neck and right UT PAIN:  Are you having pain? Yes NPRS scale: 5/10 Pain location: right UT/shoulder towards scapula Pain orientation: Right  PAIN TYPE: aching and tight Pain description: constant and aching  Aggravating factors: sleeping, driving  Relieving factors: release techniques   OBJECTIVE: (objective measures completed at initial evaluation unless otherwise dated)   PALPATION: Some increased scar tissue/seroma palpable in R axilla   OBSERVATIONS / OTHER ASSESSMENTS: some swelling present on inferior breast as evidenced by increased pore size   POSTURE: forward head, rounded shoulders   UPPER EXTREMITY AROM/PROM:   A/PROM RIGHT   eval   08/03/22 08/10/2022  Shoulder extension 62  67  Shoulder flexion 122 167 - axilla pull 168 mild pull   Shoulder abduction 96 175 - no pull  180  Shoulder internal rotation 63    Shoulder external rotation 88  110                          (Blank rows = not tested)   A/PROM LEFT   eval  Shoulder extension 68  Shoulder flexion 160  Shoulder abduction 175  Shoulder internal rotation 63  Shoulder external rotation 87                          (Blank rows = not tested)       LYMPHEDEMA ASSESSMENTS:   SURGERY TYPE/DATE: R lumpectomy and SLNB 06/18/22  NUMBER OF LYMPH NODES REMOVED: 5 - all negative  CHEMOTHERAPY: completed and currently on Keytruda  RADIATION:will need in the future  HORMONE TREATMENT: pt does not require  INFECTIONS: none   LYMPHEDEMA ASSESSMENTS:    LANDMARK RIGHT  eval  10 cm proximal to olecranon process 26.5  Olecranon process 24.5  10 cm proximal to ulnar styloid process 19.1  Just proximal to ulnar styloid process 14.7  Across hand at thumb web space 18.6  At base of 2nd digit 5.6  (Blank rows = not tested)   LANDMARK LEFT  eval  10 cm proximal to olecranon process 26.2  Olecranon process 25  10 cm proximal to ulnar styloid process 19.3  Just proximal to ulnar styloid process 14.6  Across hand at thumb web space 18.5  At base of 2nd digit 5.5  (Blank rows = not tested)     QUICK DASH SURVEY: 16%              TODAY'S TREATMENT  08/23/22: MLD: In Supine short neck, 5 diaphragmatic breaths, Lt axillary and Rt inguinal nodes, anterior inter-axillary and Rt axillo-inguinal anastomosis, then focused on Rt medial, then into Lt S/L for lateral breast redirecting towards Rt axillo-inguinal anastomosis with pt permission and appropriate draping throughout. P/ROM in supine to Rt shoulder into flexion, abd and D3 to pts end available end ROM with scapular depression applied throughout due to muscle tightness Soft tissue mobs performed in Lt S/L to right UT and levator with cocoa butter where increased muscle tightness palpated,then removed with alcohol  wipes prior to going to radiation   08/17/22 Soft tissue massage performed to right UT,  levator, and rhomboids with cocoa butter with increased muscle tightness noted and removed with alcohol wipes prior to going to radiation MLD: In Supine short neck, 5 diaphragmatic breaths, Lt axillary and Rt inguinal nodes, anterior inter-axillary and Rt axillo-inguinal anastomosis, then focused on Rt medial and lateral breast with pt permission.  Created a home exercise program with the following stretches to help decrease tightness: lower trunk rotations with arms extended, goal post stretch, UT and levator scap stretch - educated pt in each and issued as part of HEP   08/10/2022 Discussed sports bra or  compression bra and gave script for compression bra as she feels she is swelling more with radiation. Soft tissue massage performed to right UT levator area with cocoa butter and removed with alcohol wipes prior to going to radiation MLD: In Supine short neck, 5 diaphragmatic breaths, Lt axillary and Rt inguinal nodes, anterior inter-axillary and Rt axillo-inguinal anastomosis, then focused on Rt medial and lateral breast with pt permission. Instructed pt in self MLD techniques and had perform all briefly and issued handout. Performed lower trunk rotations with arms extended and goal post with mild stretch. Performed UT and levator scap stretch x 3 ea with 10 sec hold but forgot to give pictures    PATIENT EDUCATION:  Education details: post op exercises, axillary cording, truncal stretch Person educated: Patient Education method: Explanation, Demonstration, Verbal cues, and Handouts Education comprehension: verbalized understanding     HOME EXERCISE PROGRAM: Post op exercises  Access Code: [email or web address] URL: [email or web address] Date: 08/17/2022 Prepared by: Manus Gunning  Exercises - Supine Shoulder External Rotation Stretch  - 1 x daily - 7 x weekly - 1 sets - 5-10 reps - 15-30  seconds hold - Supine Lower Trunk Rotation  - 1 x daily - 7 x weekly - 1 sets - 5-10 reps - 15-30 sec  hold - Seated Upper Trap Stretch  - 1 x daily - 7 x weekly - 1 sets - 5-10 reps - 15-30 hold - Seated Levator Scapulae Stretch (Mirrored)  - 1 x daily - 7 x weekly - 1 sets - 5-10 reps - 15-30 hold  ASSESSMENT:   CLINICAL IMPRESSION: Pt continues to present with Rt UT and levator tightness and Rt shoulder pain. 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 Continued with STM and P/ROM along with MLD to address aforementioned deficits. Pt reports feeling good relief of current symptoms by end of session. Anticipate increased tightness and edema in Rt upper quadrant continuing for now as pt is currently undergoing radiation with a few weeks still remaining. Pt will benefit from continued physical therapy at this time to prevent further tightening and increased pain of Rt upper quadrant along with helping her to manage the current breast and trunk edema symptoms.     OBJECTIVE IMPAIRMENTS decreased ROM, increased edema, increased fascial restrictions, increased muscle spasms, impaired UE functional use, and postural dysfunction.    ACTIVITY LIMITATIONS lifting and reach over head   PARTICIPATION LIMITATIONS:  none   PERSONAL FACTORS  none  are also affecting patient's functional outcome.    REHAB POTENTIAL: Excellent   CLINICAL DECISION MAKING: Stable/uncomplicated   EVALUATION COMPLEXITY: Low   GOALS: Goals reviewed with patient? Yes   SHORT TERM GOALS=LONG TERM GOALS  Target date: 08/10/2022     Pt will demonstrate 160 degrees of R shoulder flexion to allow her to reach overhead.  Baseline: 122 Goal status: MET   2.  Pt will demonstrate 165 degrees of L shoulder abduction to allow her to reach up and out to the side.  Baseline: 96 Goal status: MET   3.  Pt will report she is no longer having rib pain to allow her improved comfort.  Baseline:  Goal  status: MET   4.  Pt will not demonstrate any increase in pore size in inferior breast to decrease risk of cellulitis.  Baseline:  Goal status:progressing   5.  Pt will be independent in a home exercise program for continued stretching and strengthening.  Baseline:  Goal  status: progressing   PLAN: PT FREQUENCY: 1-2 x/week   PT DURATION: 2-4 weeks   PLANNED INTERVENTIONS: Therapeutic exercises, Therapeutic activity, Patient/Family education, Self Care, Orthotic/Fit training, Manual lymph drainage, Compression bandaging, scar mobilization, Taping, Vasopneumatic device, and Manual therapy, dry needling   PLAN FOR NEXT SESSION: STM Rt upper quadrant, PROM,  MLD to R breast and axilla and review with pt., gave script for compression bra and pt may purchase if insurance covers, try supine scap or similar, consider Dry needling for UT/scap trigger points (someone had mentioned this to pt) cervical ROM;give pics of stretches    Otelia Limes, PTA 08/23/2022, 2:06 PM

## 2022-08-24 ENCOUNTER — Ambulatory Visit
Admission: RE | Admit: 2022-08-24 | Discharge: 2022-08-24 | Disposition: A | Discharge status: Home / Self Care | Payer: 59 | Source: Ambulatory Visit | Attending: Radiation Oncology | Admitting: Radiation Oncology

## 2022-08-24 ENCOUNTER — Other Ambulatory Visit: Payer: Self-pay

## 2022-08-24 DIAGNOSIS — Z5112 Encounter for antineoplastic immunotherapy: Secondary | ICD-10-CM | POA: Diagnosis not present

## 2022-08-24 LAB — RAD ONC ARIA SESSION SUMMARY
Course Elapsed Days: 28
Plan Fractions Treated to Date: 18
Plan Fractions Treated to Date: 18
Plan Prescribed Dose Per Fraction: 1.8 Gy
Plan Prescribed Dose Per Fraction: 1.8 Gy
Plan Total Fractions Prescribed: 27
Plan Total Fractions Prescribed: 27
Plan Total Prescribed Dose: 48.6 Gy
Plan Total Prescribed Dose: 48.6 Gy
Reference Point Dosage Given to Date: 34.2 Gy
Reference Point Dosage Given to Date: 34.2 Gy
Reference Point Session Dosage Given: 1.8 Gy
Reference Point Session Dosage Given: 1.8 Gy
Session Number: 19

## 2022-08-25 ENCOUNTER — Other Ambulatory Visit: Payer: Self-pay

## 2022-08-25 ENCOUNTER — Ambulatory Visit
Admission: RE | Admit: 2022-08-25 | Discharge: 2022-08-25 | Disposition: A | Discharge status: Home / Self Care | Payer: 59 | Source: Ambulatory Visit | Attending: Radiation Oncology | Admitting: Radiation Oncology

## 2022-08-25 DIAGNOSIS — Z5112 Encounter for antineoplastic immunotherapy: Secondary | ICD-10-CM | POA: Diagnosis not present

## 2022-08-25 LAB — RAD ONC ARIA SESSION SUMMARY
Course Elapsed Days: 29
Plan Fractions Treated to Date: 19
Plan Fractions Treated to Date: 19
Plan Prescribed Dose Per Fraction: 1.8 Gy
Plan Prescribed Dose Per Fraction: 1.8 Gy
Plan Total Fractions Prescribed: 27
Plan Total Fractions Prescribed: 27
Plan Total Prescribed Dose: 48.6 Gy
Plan Total Prescribed Dose: 48.6 Gy
Reference Point Dosage Given to Date: 36 Gy
Reference Point Dosage Given to Date: 36 Gy
Reference Point Session Dosage Given: 1.8 Gy
Reference Point Session Dosage Given: 1.8 Gy
Session Number: 20

## 2022-08-26 ENCOUNTER — Other Ambulatory Visit: Payer: Self-pay

## 2022-08-26 ENCOUNTER — Ambulatory Visit
Admission: RE | Admit: 2022-08-26 | Discharge: 2022-08-26 | Disposition: A | Discharge status: Home / Self Care | Payer: 59 | Source: Ambulatory Visit | Attending: Radiation Oncology | Admitting: Radiation Oncology

## 2022-08-26 DIAGNOSIS — Z5112 Encounter for antineoplastic immunotherapy: Secondary | ICD-10-CM | POA: Diagnosis not present

## 2022-08-26 LAB — RAD ONC ARIA SESSION SUMMARY
Course Elapsed Days: 30
Plan Fractions Treated to Date: 20
Plan Fractions Treated to Date: 20
Plan Prescribed Dose Per Fraction: 1.8 Gy
Plan Prescribed Dose Per Fraction: 1.8 Gy
Plan Total Fractions Prescribed: 27
Plan Total Fractions Prescribed: 27
Plan Total Prescribed Dose: 48.6 Gy
Plan Total Prescribed Dose: 48.6 Gy
Reference Point Dosage Given to Date: 37.8 Gy
Reference Point Dosage Given to Date: 37.8 Gy
Reference Point Session Dosage Given: 1.8 Gy
Reference Point Session Dosage Given: 1.8 Gy
Session Number: 21

## 2022-08-27 ENCOUNTER — Ambulatory Visit
Admission: RE | Admit: 2022-08-27 | Discharge: 2022-08-27 | Disposition: A | Discharge status: Home / Self Care | Payer: 59 | Source: Ambulatory Visit | Attending: Radiation Oncology | Admitting: Radiation Oncology

## 2022-08-27 ENCOUNTER — Other Ambulatory Visit: Payer: Self-pay

## 2022-08-27 ENCOUNTER — Ambulatory Visit: Payer: 59 | Admitting: Radiation Oncology

## 2022-08-27 DIAGNOSIS — C50411 Malignant neoplasm of upper-outer quadrant of right female breast: Secondary | ICD-10-CM | POA: Insufficient documentation

## 2022-08-27 LAB — RAD ONC ARIA SESSION SUMMARY
Course Elapsed Days: 31
Plan Fractions Treated to Date: 21
Plan Fractions Treated to Date: 21
Plan Prescribed Dose Per Fraction: 1.8 Gy
Plan Prescribed Dose Per Fraction: 1.8 Gy
Plan Total Fractions Prescribed: 27
Plan Total Fractions Prescribed: 27
Plan Total Prescribed Dose: 48.6 Gy
Plan Total Prescribed Dose: 48.6 Gy
Reference Point Dosage Given to Date: 39.6 Gy
Reference Point Dosage Given to Date: 39.6 Gy
Reference Point Session Dosage Given: 1.8 Gy
Reference Point Session Dosage Given: 1.8 Gy
Session Number: 22

## 2022-08-31 ENCOUNTER — Ambulatory Visit
Admission: RE | Admit: 2022-08-31 | Discharge: 2022-08-31 | Disposition: A | Discharge status: Home / Self Care | Payer: 59 | Source: Ambulatory Visit | Attending: Radiation Oncology | Admitting: Radiation Oncology

## 2022-08-31 ENCOUNTER — Other Ambulatory Visit: Payer: Self-pay

## 2022-08-31 DIAGNOSIS — C50411 Malignant neoplasm of upper-outer quadrant of right female breast: Secondary | ICD-10-CM | POA: Diagnosis not present

## 2022-08-31 LAB — RAD ONC ARIA SESSION SUMMARY
Course Elapsed Days: 35
Plan Fractions Treated to Date: 22
Plan Fractions Treated to Date: 22
Plan Prescribed Dose Per Fraction: 1.8 Gy
Plan Prescribed Dose Per Fraction: 1.8 Gy
Plan Total Fractions Prescribed: 27
Plan Total Fractions Prescribed: 27
Plan Total Prescribed Dose: 48.6 Gy
Plan Total Prescribed Dose: 48.6 Gy
Reference Point Dosage Given to Date: 41.4 Gy
Reference Point Dosage Given to Date: 41.4 Gy
Reference Point Session Dosage Given: 1.8 Gy
Reference Point Session Dosage Given: 1.8 Gy
Session Number: 23

## 2022-09-01 ENCOUNTER — Ambulatory Visit
Admission: RE | Admit: 2022-09-01 | Discharge: 2022-09-01 | Disposition: A | Discharge status: Home / Self Care | Payer: 59 | Source: Ambulatory Visit | Attending: Radiation Oncology | Admitting: Radiation Oncology

## 2022-09-01 ENCOUNTER — Other Ambulatory Visit: Payer: Self-pay

## 2022-09-01 DIAGNOSIS — C50411 Malignant neoplasm of upper-outer quadrant of right female breast: Secondary | ICD-10-CM | POA: Diagnosis not present

## 2022-09-01 LAB — RAD ONC ARIA SESSION SUMMARY
Course Elapsed Days: 36
Plan Fractions Treated to Date: 23
Plan Fractions Treated to Date: 23
Plan Prescribed Dose Per Fraction: 1.8 Gy
Plan Prescribed Dose Per Fraction: 1.8 Gy
Plan Total Fractions Prescribed: 27
Plan Total Fractions Prescribed: 27
Plan Total Prescribed Dose: 48.6 Gy
Plan Total Prescribed Dose: 48.6 Gy
Reference Point Dosage Given to Date: 43.2 Gy
Reference Point Dosage Given to Date: 43.2 Gy
Reference Point Session Dosage Given: 1.8 Gy
Reference Point Session Dosage Given: 1.8 Gy
Session Number: 24

## 2022-09-02 ENCOUNTER — Inpatient Hospital Stay: Payer: 59

## 2022-09-02 ENCOUNTER — Other Ambulatory Visit: Payer: Self-pay

## 2022-09-02 ENCOUNTER — Ambulatory Visit
Admission: RE | Admit: 2022-09-02 | Discharge: 2022-09-02 | Disposition: A | Discharge status: Home / Self Care | Payer: 59 | Source: Ambulatory Visit | Attending: Radiation Oncology | Admitting: Radiation Oncology

## 2022-09-02 DIAGNOSIS — C50411 Malignant neoplasm of upper-outer quadrant of right female breast: Secondary | ICD-10-CM | POA: Diagnosis not present

## 2022-09-02 LAB — RAD ONC ARIA SESSION SUMMARY
Course Elapsed Days: 37
Plan Fractions Treated to Date: 24
Plan Fractions Treated to Date: 24
Plan Prescribed Dose Per Fraction: 1.8 Gy
Plan Prescribed Dose Per Fraction: 1.8 Gy
Plan Total Fractions Prescribed: 27
Plan Total Fractions Prescribed: 27
Plan Total Prescribed Dose: 48.6 Gy
Plan Total Prescribed Dose: 48.6 Gy
Reference Point Dosage Given to Date: 45 Gy
Reference Point Dosage Given to Date: 45 Gy
Reference Point Session Dosage Given: 1.8 Gy
Reference Point Session Dosage Given: 1.8 Gy
Session Number: 25

## 2022-09-03 ENCOUNTER — Ambulatory Visit: Payer: 59

## 2022-09-03 ENCOUNTER — Ambulatory Visit
Admission: RE | Admit: 2022-09-03 | Discharge: 2022-09-03 | Disposition: A | Discharge status: Home / Self Care | Payer: 59 | Source: Ambulatory Visit | Attending: Radiation Oncology | Admitting: Radiation Oncology

## 2022-09-03 ENCOUNTER — Other Ambulatory Visit: Payer: Self-pay

## 2022-09-03 DIAGNOSIS — C50411 Malignant neoplasm of upper-outer quadrant of right female breast: Secondary | ICD-10-CM | POA: Diagnosis not present

## 2022-09-03 LAB — RAD ONC ARIA SESSION SUMMARY
Course Elapsed Days: 38
Course Elapsed Days: 38
Plan Fractions Treated to Date: 25
Plan Fractions Treated to Date: 25
Plan Fractions Treated to Date: 26
Plan Fractions Treated to Date: 26
Plan Prescribed Dose Per Fraction: 1.8 Gy
Plan Prescribed Dose Per Fraction: 1.8 Gy
Plan Prescribed Dose Per Fraction: 1.8 Gy
Plan Prescribed Dose Per Fraction: 1.8 Gy
Plan Total Fractions Prescribed: 27
Plan Total Fractions Prescribed: 27
Plan Total Fractions Prescribed: 27
Plan Total Fractions Prescribed: 27
Plan Total Prescribed Dose: 48.6 Gy
Plan Total Prescribed Dose: 48.6 Gy
Plan Total Prescribed Dose: 48.6 Gy
Plan Total Prescribed Dose: 48.6 Gy
Reference Point Dosage Given to Date: 46.8 Gy
Reference Point Dosage Given to Date: 46.8 Gy
Reference Point Dosage Given to Date: 48.6 Gy
Reference Point Dosage Given to Date: 48.6 Gy
Reference Point Session Dosage Given: 1.8 Gy
Reference Point Session Dosage Given: 1.8 Gy
Reference Point Session Dosage Given: 1.8 Gy
Reference Point Session Dosage Given: 1.8 Gy
Session Number: 26
Session Number: 27

## 2022-09-06 ENCOUNTER — Ambulatory Visit: Payer: 59 | Admitting: Radiation Oncology

## 2022-09-06 ENCOUNTER — Ambulatory Visit: Payer: 59

## 2022-09-07 ENCOUNTER — Other Ambulatory Visit: Payer: Self-pay

## 2022-09-07 ENCOUNTER — Ambulatory Visit
Admission: RE | Admit: 2022-09-07 | Discharge: 2022-09-07 | Disposition: A | Discharge status: Home / Self Care | Payer: 59 | Source: Ambulatory Visit | Attending: Radiation Oncology | Admitting: Radiation Oncology

## 2022-09-07 ENCOUNTER — Ambulatory Visit: Payer: 59 | Admitting: Radiation Oncology

## 2022-09-07 ENCOUNTER — Ambulatory Visit: Payer: 59

## 2022-09-07 DIAGNOSIS — C50411 Malignant neoplasm of upper-outer quadrant of right female breast: Secondary | ICD-10-CM | POA: Diagnosis not present

## 2022-09-07 LAB — RAD ONC ARIA SESSION SUMMARY
Course Elapsed Days: 42
Plan Fractions Treated to Date: 27
Plan Fractions Treated to Date: 27
Plan Prescribed Dose Per Fraction: 1.8 Gy
Plan Prescribed Dose Per Fraction: 1.8 Gy
Plan Total Fractions Prescribed: 27
Plan Total Fractions Prescribed: 27
Plan Total Prescribed Dose: 48.6 Gy
Plan Total Prescribed Dose: 48.6 Gy
Reference Point Dosage Given to Date: 50.4 Gy
Reference Point Dosage Given to Date: 50.4 Gy
Reference Point Session Dosage Given: 1.8 Gy
Reference Point Session Dosage Given: 1.8 Gy
Session Number: 28

## 2022-09-07 NOTE — Progress Notes (Signed)
   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 This was delivered using a 3-D conformal technique. The patient then received a boost to the seroma. This delivered an additional 6 Gy of the planned 10 Gy. She completed 3 of the 5 intended fractions, but declined to finish her last treatments due to vacation. Her total dose was 56.4 Gy of the planned 60.4 Gy total.   Narrative: She developed fatigue and anticipated skin changes in the treatment field.   Plan: The patient will receive a call in about one month from the radiation oncology department. She will continue follow up with Dr. Lindi Adie as well.      Sydney Rios, PAC

## 2022-09-08 ENCOUNTER — Ambulatory Visit
Admission: RE | Admit: 2022-09-08 | Discharge: 2022-09-08 | Disposition: A | Discharge status: Home / Self Care | Payer: 59 | Source: Ambulatory Visit | Attending: Radiation Oncology | Admitting: Radiation Oncology

## 2022-09-08 ENCOUNTER — Ambulatory Visit: Payer: 59

## 2022-09-08 ENCOUNTER — Ambulatory Visit: Payer: 59 | Admitting: Radiation Oncology

## 2022-09-08 ENCOUNTER — Ambulatory Visit: Payer: 59 | Attending: Hematology and Oncology

## 2022-09-08 ENCOUNTER — Other Ambulatory Visit: Payer: Self-pay

## 2022-09-08 DIAGNOSIS — C50411 Malignant neoplasm of upper-outer quadrant of right female breast: Secondary | ICD-10-CM | POA: Diagnosis not present

## 2022-09-08 DIAGNOSIS — Z171 Estrogen receptor negative status [ER-]: Secondary | ICD-10-CM | POA: Insufficient documentation

## 2022-09-08 DIAGNOSIS — Z483 Aftercare following surgery for neoplasm: Secondary | ICD-10-CM | POA: Insufficient documentation

## 2022-09-08 DIAGNOSIS — R293 Abnormal posture: Secondary | ICD-10-CM | POA: Insufficient documentation

## 2022-09-08 DIAGNOSIS — C50211 Malignant neoplasm of upper-inner quadrant of right female breast: Secondary | ICD-10-CM | POA: Diagnosis present

## 2022-09-08 DIAGNOSIS — M25611 Stiffness of right shoulder, not elsewhere classified: Secondary | ICD-10-CM | POA: Insufficient documentation

## 2022-09-08 DIAGNOSIS — R600 Localized edema: Secondary | ICD-10-CM | POA: Diagnosis present

## 2022-09-08 LAB — RAD ONC ARIA SESSION SUMMARY
Course Elapsed Days: 43
Plan Fractions Treated to Date: 1
Plan Prescribed Dose Per Fraction: 2 Gy
Plan Total Fractions Prescribed: 5
Plan Total Prescribed Dose: 10 Gy
Reference Point Dosage Given to Date: 2 Gy
Reference Point Session Dosage Given: 2 Gy
Session Number: 29

## 2022-09-08 NOTE — Addendum Note (Signed)
Addended by: Claris Pong on: 09/08/2022 06:07 PM   Modules accepted: Orders

## 2022-09-08 NOTE — Therapy (Addendum)
OUTPATIENT PHYSICAL THERAPY TREATMENT NOTE   Patient Name: Sydney Rios MRN: [id number] DOB:[birth year], 41 y.o., female Today's Date: 09/08/2022  PCP:  Horald Pollen, MD REFERRING PROVIDER: Nicholas Lose, MD  END OF SESSION:   PT End of Session - 09/08/22 1109     Visit Number 7    Number of Visits 20    Date for PT Re-Evaluation 10/06/22    PT Start Time 27    PT Stop Time 1202    PT Time Calculation (min) 56 min    Activity Tolerance Patient tolerated treatment well    Behavior During Therapy WFL for tasks assessed/performed             Past Medical History:  Diagnosis Date   Anxiety    Chronic kidney disease    hx kidney stones   Complication of anesthesia    runs heart rate in the 30-40s in PACU   History of kidney stones    Hx gestational diabetes    Hx of Hashimoto thyroiditis    PONV (postoperative nausea and vomiting)    Past Surgical History:  Procedure Laterality Date   CESAREAN SECTION     CESAREAN SECTION  01/23/2013   Procedure: CESAREAN SECTION;  Surgeon: Luz Lex, MD;  Location: [facility];  Service: Obstetrics;  Laterality: N/A;   CESAREAN SECTION N/A 10/11/2014   Procedure: CESAREAN SECTION;  Surgeon: Cyril Mourning, MD;  Location: [facility];  Service: Obstetrics;  Laterality: N/A;   CESAREAN SECTION N/A 10/24/2018   Procedure: REPEAT CESAREAN SECTION;  Surgeon: Dian Queen, MD;  Location: [facility];  Service: Obstetrics;  Laterality: N/A;  Tracey RNFA   LITHOTRIPSY     PORTACATH PLACEMENT Left 11/25/2021   Procedure: PORT PLACEMENT;  Surgeon: Rolm Bookbinder, MD;  Location: [facility];  Service: General;  Laterality: Left;   WISDOM TOOTH EXTRACTION     Patient Active Problem List   Diagnosis Date Noted   Genetic testing 12/01/2021   Port-A-Cath in place 11/30/2021   Malignant neoplasm of upper-outer quadrant of right breast in female, estrogen receptor negative ([place]) 11/11/2021   Abnormal maternal glucose tolerance,  antepartum 09/15/2018   S/P cesarean section 10/11/2014   TACHYCARDIA 05/01/2010    REFERRING DIAG: C50.411,Z17.1 (ICD-10-CM) - Malignant neoplasm of upper-outer quadrant of right breast in female, estrogen receptor negative ([place])  THERAPY DIAG:  Stiffness of right shoulder, not elsewhere classified  Aftercare following surgery for neoplasm  Localized edema  Abnormal posture  Malignant neoplasm of upper-inner quadrant of right breast in female, estrogen receptor negative ([place])  Rationale for Evaluation and Treatment Rehabilitation  PERTINENT HISTORY: Patient was diagnosed on 11/05/2021 with right grade III invasive ductal carcinoma breast cancer. She has a large axillary lymph node that was biopsied and positive for disease. It is triple negative with a Ki67 of 40%. Completed chemo 11/30/21-04/29/22. 06/18/22- R lumpectomy 0/5 lymph nodes  PRECAUTIONS:  Other: at risk for lymphedema on R side  SUBJECTIVE: My Rt shoulder is feeling a lot better. The breast is doing okay I think for being radiated. It certainly is still swollen, but I don't think it's gotten much worse, but the pores are still visible.     PAIN:  Are you having pain? No   OBJECTIVE: (objective measures completed at initial evaluation unless otherwise dated)   PALPATION: Some increased scar tissue/seroma palpable in R axilla   OBSERVATIONS / OTHER ASSESSMENTS: some swelling present on inferior breast as evidenced by increased pore size  POSTURE: forward head, rounded shoulders   UPPER EXTREMITY AROM/PROM:   A/PROM RIGHT   eval   08/03/22 08/10/2022 09/08/22  Shoulder extension 62  67   Shoulder flexion 122 167 - axilla pull 168 mild pull 165 mild pull  Shoulder abduction 96 175 - no pull  180 182  Shoulder internal rotation 63     Shoulder external rotation 88  110                           (Blank rows = not tested)   A/PROM LEFT   eval  Shoulder extension 68  Shoulder flexion 160  Shoulder abduction  175  Shoulder internal rotation 63  Shoulder external rotation 87                          (Blank rows = not tested)       LYMPHEDEMA ASSESSMENTS:   SURGERY TYPE/DATE: R lumpectomy and SLNB 06/18/22  NUMBER OF LYMPH NODES REMOVED: 5 - all negative  CHEMOTHERAPY: completed and currently on Keytruda  RADIATION:will need in the future  HORMONE TREATMENT: pt does not require  INFECTIONS: none   LYMPHEDEMA ASSESSMENTS:    LANDMARK RIGHT  eval  10 cm proximal to olecranon process 26.5  Olecranon process 24.5  10 cm proximal to ulnar styloid process 19.1  Just proximal to ulnar styloid process 14.7  Across hand at thumb web space 18.6  At base of 2nd digit 5.6  (Blank rows = not tested)   LANDMARK LEFT  eval  10 cm proximal to olecranon process 26.2  Olecranon process 25  10 cm proximal to ulnar styloid process 19.3  Just proximal to ulnar styloid process 14.6  Across hand at thumb web space 18.5  At base of 2nd digit 5.5  (Blank rows = not tested)     QUICK DASH SURVEY: 16%              TODAY'S TREATMENT  09/08/22: Manual Therapy MLD: In Supine short neck, 5 diaphragmatic breaths, Lt axillary and Rt inguinal nodes, anterior inter-axillary and Rt axillo-inguinal anastomosis, then focused on Rt medial, with pt permission and appropriate draping throughout. P/ROM in supine to Rt shoulder into flexion and abd to pts end available end ROM with scapular depression applied throughout due to muscle tightness and blocking in axilla above radiated area to prevent pull on radiated tissue as her skin is peeling in axilla Soft tissue mobs performed in Rt axilla above radiated area to scar tissue  08/23/22: MLD: In Supine short neck, 5 diaphragmatic breaths, Lt axillary and Rt inguinal nodes, anterior inter-axillary and Rt axillo-inguinal anastomosis, then focused on Rt medial, then into Lt S/L for lateral breast redirecting towards Rt axillo-inguinal anastomosis with pt permission and  appropriate draping throughout. P/ROM in supine to Rt shoulder into flexion, abd and D3 to pts end available end ROM with scapular depression applied throughout due to muscle tightness Soft tissue mobs performed in Lt S/L to right UT and levator with cocoa butter where increased muscle tightness palpated,then removed with alcohol wipes prior to going to radiation   08/17/22 Soft tissue massage performed to right UT,  levator, and rhomboids with cocoa butter with increased muscle tightness noted and removed with alcohol wipes prior to going to radiation MLD: In Supine short neck, 5 diaphragmatic breaths, Lt axillary and Rt inguinal nodes, anterior inter-axillary and Rt axillo-inguinal anastomosis, then focused  on Rt medial and lateral breast with pt permission.  Created a home exercise program with the following stretches to help decrease tightness: lower trunk rotations with arms extended, goal post stretch, UT and levator scap stretch - educated pt in each and issued as part of HEP    PATIENT EDUCATION:  Education details: post op exercises, axillary cording, truncal stretch Person educated: Patient Education method: Explanation, Demonstration, Verbal cues, and Handouts Education comprehension: verbalized understanding     HOME EXERCISE PROGRAM: Post op exercises  Access Code: [email or web address] URL: [email or web address] Date: 08/17/2022 Prepared by: Manus Gunning  Exercises - Supine Shoulder External Rotation Stretch  - 1 x daily - 7 x weekly - 1 sets - 5-10 reps - 15-30 seconds hold - Supine Lower Trunk Rotation  - 1 x daily - 7 x weekly - 1 sets - 5-10 reps - 15-30 sec  hold - Seated Upper Trap Stretch  - 1 x daily - 7 x weekly - 1 sets - 5-10 reps - 15-30 hold - Seated Levator Scapulae Stretch (Mirrored)  - 1 x daily - 7 x weekly - 1 sets - 5-10 reps - 15-30 hold  ASSESSMENT:   CLINICAL IMPRESSION: Pt returns to physical therapy being near the end of her radiation  treatment. She has a few boost treatments left and will be finished Friday before going out of town. Continued manual therapy working to decrease breast lymphedema (pore size still evident), and decrease Rt upper quadrant tightness as able being mindful of radiated tissue and peeling skin. Pt plans to call us in about 2 weeks after finishing radiation for reassess then. Renewal done today to cont with breast lymphedema treatment when she returns and reassess A/ROM and assess for any other deficits that may be present after having completed radiation.    OBJECTIVE IMPAIRMENTS decreased ROM, increased edema, increased fascial restrictions, increased muscle spasms, impaired UE functional use, and postural dysfunction.    ACTIVITY LIMITATIONS lifting and reach over head   PARTICIPATION LIMITATIONS:  none   PERSONAL FACTORS  none  are also affecting patient's functional outcome.    REHAB POTENTIAL: Excellent   CLINICAL DECISION MAKING: Stable/uncomplicated   EVALUATION COMPLEXITY: Low   GOALS: Goals reviewed with patient? Yes   SHORT TERM GOALS=LONG TERM GOALS  Target date: 10/06/2022     Pt will demonstrate 160 degrees of R shoulder flexion to allow her to reach overhead.  Baseline: 122; 165 degrees - 09/08/22 Goal status: MET   2.  Pt will demonstrate 165 degrees of L shoulder abduction to allow her to reach up and out to the side.  Baseline: 96; 182 degrees - 09/08/22 Goal status: MET   3.  Pt will report she is no longer having rib pain to allow her improved comfort.  Baseline:  Goal status: MET   4.  Pt will not demonstrate any increase in pore size in inferior breast to decrease risk of cellulitis.  Baseline: Pore size still present as pt has had increased lymphedema present due to currently undergoing radiation - 09/08/22 Goal status: ONGOING   5.  Pt will be independent in a home exercise program for continued stretching and strengthening.  Baseline: Pt is independent with  current HEP - 09/08/22 Goal status: ONGOING   PLAN: PT FREQUENCY: 1-2 x/week   PT DURATION: 2-4 weeks   PLANNED INTERVENTIONS: Therapeutic exercises, Therapeutic activity, Patient/Family education, Self Care, Orthotic/Fit training, Manual lymph drainage, Compression bandaging, scar mobilization, Taping, Vasopneumatic device, and Manual therapy,  dry needling   PLAN FOR NEXT SESSION: Renewal done this visit. Reassess current functional status if she returns as she will have completed radiation then. May need to cont STM Rt upper quadrant, PROM,  MLD to R breast and axilla and review with pt., gave script for compression bra and pt may purchase if insurance covers, try supine scap or similar, consider Dry needling for UT/scap trigger points (someone had mentioned this to pt) cervical ROM;give pics of stretches    Otelia Limes, PTA 09/08/2022, 4:33 PM

## 2022-09-09 ENCOUNTER — Encounter: Payer: Self-pay | Admitting: *Deleted

## 2022-09-09 ENCOUNTER — Ambulatory Visit: Payer: 59

## 2022-09-09 ENCOUNTER — Inpatient Hospital Stay: Payer: 59

## 2022-09-09 ENCOUNTER — Ambulatory Visit: Payer: 59 | Admitting: Radiation Oncology

## 2022-09-09 ENCOUNTER — Telehealth: Payer: Self-pay | Admitting: Adult Health

## 2022-09-09 NOTE — Telephone Encounter (Signed)
Left a voicemail regarding 09/15 appointment.

## 2022-09-10 ENCOUNTER — Ambulatory Visit
Admission: RE | Admit: 2022-09-10 | Discharge: 2022-09-10 | Disposition: A | Discharge status: Home / Self Care | Payer: 59 | Source: Ambulatory Visit | Attending: Radiation Oncology | Admitting: Radiation Oncology

## 2022-09-10 ENCOUNTER — Other Ambulatory Visit: Payer: Self-pay

## 2022-09-10 ENCOUNTER — Inpatient Hospital Stay: Payer: 59

## 2022-09-10 ENCOUNTER — Ambulatory Visit: Payer: 59

## 2022-09-10 ENCOUNTER — Other Ambulatory Visit: Payer: 59

## 2022-09-10 ENCOUNTER — Telehealth: Payer: Self-pay

## 2022-09-10 DIAGNOSIS — C50411 Malignant neoplasm of upper-outer quadrant of right female breast: Secondary | ICD-10-CM | POA: Diagnosis not present

## 2022-09-10 LAB — RAD ONC ARIA SESSION SUMMARY
Course Elapsed Days: 45
Course Elapsed Days: 45
Plan Fractions Treated to Date: 2
Plan Fractions Treated to Date: 3
Plan Prescribed Dose Per Fraction: 2 Gy
Plan Prescribed Dose Per Fraction: 2 Gy
Plan Total Fractions Prescribed: 5
Plan Total Fractions Prescribed: 5
Plan Total Prescribed Dose: 10 Gy
Plan Total Prescribed Dose: 10 Gy
Reference Point Dosage Given to Date: 4 Gy
Reference Point Dosage Given to Date: 6 Gy
Reference Point Session Dosage Given: 2 Gy
Reference Point Session Dosage Given: 2 Gy
Session Number: 30
Session Number: 31

## 2022-09-10 NOTE — Telephone Encounter (Signed)
Attempted to call pt 3X as she had radiation and did not proceed to infusion as scheduled. LVM for pt to return call to [facility]

## 2022-09-11 ENCOUNTER — Other Ambulatory Visit: Payer: Self-pay

## 2022-09-13 ENCOUNTER — Ambulatory Visit: Payer: 59

## 2022-09-13 ENCOUNTER — Ambulatory Visit: Payer: 59 | Admitting: Radiation Oncology

## 2022-09-13 ENCOUNTER — Encounter: Payer: Self-pay | Admitting: Radiation Oncology

## 2022-09-14 ENCOUNTER — Ambulatory Visit: Payer: 59

## 2022-09-20 ENCOUNTER — Encounter: Payer: Self-pay | Admitting: Hematology and Oncology

## 2022-09-23 ENCOUNTER — Inpatient Hospital Stay: Payer: 59

## 2022-09-23 ENCOUNTER — Inpatient Hospital Stay: Payer: 59 | Admitting: Adult Health

## 2022-09-24 ENCOUNTER — Encounter: Payer: Self-pay | Admitting: Hematology and Oncology

## 2022-09-29 ENCOUNTER — Other Ambulatory Visit: Payer: Self-pay | Admitting: Hematology and Oncology

## 2022-09-30 ENCOUNTER — Other Ambulatory Visit: Payer: Self-pay

## 2022-09-30 ENCOUNTER — Inpatient Hospital Stay: Payer: 59

## 2022-09-30 ENCOUNTER — Inpatient Hospital Stay (HOSPITAL_BASED_OUTPATIENT_CLINIC_OR_DEPARTMENT_OTHER): Payer: 59 | Admitting: Adult Health

## 2022-09-30 ENCOUNTER — Inpatient Hospital Stay: Payer: 59 | Attending: Licensed Clinical Social Worker

## 2022-09-30 ENCOUNTER — Encounter: Payer: Self-pay | Admitting: Adult Health

## 2022-09-30 VITALS — BP 118/60 | HR 70 | Temp 97.5°F | Resp 14 | Ht 67.0 in | Wt 152.6 lb

## 2022-09-30 DIAGNOSIS — Z452 Encounter for adjustment and management of vascular access device: Secondary | ICD-10-CM | POA: Diagnosis not present

## 2022-09-30 DIAGNOSIS — C50411 Malignant neoplasm of upper-outer quadrant of right female breast: Secondary | ICD-10-CM

## 2022-09-30 DIAGNOSIS — Z95828 Presence of other vascular implants and grafts: Secondary | ICD-10-CM

## 2022-09-30 DIAGNOSIS — Z171 Estrogen receptor negative status [ER-]: Secondary | ICD-10-CM | POA: Insufficient documentation

## 2022-09-30 DIAGNOSIS — Z79899 Other long term (current) drug therapy: Secondary | ICD-10-CM | POA: Insufficient documentation

## 2022-09-30 LAB — CBC WITH DIFFERENTIAL (CANCER CENTER ONLY)
Abs Immature Granulocytes: 0.01 10*3/uL (ref 0.00–0.07)
Basophils Absolute: 0 10*3/uL (ref 0.0–0.1)
Basophils Relative: 1 %
Eosinophils Absolute: 0.1 10*3/uL (ref 0.0–0.5)
Eosinophils Relative: 2 %
HCT: 32.9 % — ABNORMAL LOW (ref 36.0–46.0)
Hemoglobin: 11.1 g/dL — ABNORMAL LOW (ref 12.0–15.0)
Immature Granulocytes: 0 %
Lymphocytes Relative: 17 %
Lymphs Abs: 0.5 10*3/uL — ABNORMAL LOW (ref 0.7–4.0)
MCH: 31.3 pg (ref 26.0–34.0)
MCHC: 33.7 g/dL (ref 30.0–36.0)
MCV: 92.7 fL (ref 80.0–100.0)
Monocytes Absolute: 0.3 10*3/uL (ref 0.1–1.0)
Monocytes Relative: 12 %
Neutro Abs: 2 10*3/uL (ref 1.7–7.7)
Neutrophils Relative %: 68 %
Platelet Count: 194 10*3/uL (ref 150–400)
RBC: 3.55 MIL/uL — ABNORMAL LOW (ref 3.87–5.11)
RDW: 14.1 % (ref 11.5–15.5)
WBC Count: 2.9 10*3/uL — ABNORMAL LOW (ref 4.0–10.5)
nRBC: 0 % (ref 0.0–0.2)

## 2022-09-30 LAB — CMP (CANCER CENTER ONLY)
ALT: 13 U/L (ref 0–44)
AST: 16 U/L (ref 15–41)
Albumin: 4.4 g/dL (ref 3.5–5.0)
Alkaline Phosphatase: 47 U/L (ref 38–126)
Anion gap: 4 — ABNORMAL LOW (ref 5–15)
BUN: 9 mg/dL (ref 6–20)
CO2: 29 mmol/L (ref 22–32)
Calcium: 9.3 mg/dL (ref 8.9–10.3)
Chloride: 105 mmol/L (ref 98–111)
Creatinine: 0.74 mg/dL (ref 0.44–1.00)
GFR, Estimated: >60 mL/min (ref >60)
Glucose, Bld: 122 mg/dL — ABNORMAL HIGH (ref 70–99)
Potassium: 3.8 mmol/L (ref 3.5–5.1)
Sodium: 138 mmol/L (ref 135–145)
Total Bilirubin: 0.3 mg/dL (ref 0.3–1.2)
Total Protein: 7.2 g/dL (ref 6.5–8.1)

## 2022-09-30 LAB — TSH: TSH: 48.343 u[IU]/mL — ABNORMAL HIGH (ref 0.350–4.500)

## 2022-09-30 MED ORDER — SODIUM CHLORIDE 0.9% FLUSH
10.0000 mL | Freq: Once | INTRAVENOUS | Status: AC
  Administered 2022-09-30: 10 mL

## 2022-09-30 NOTE — Assessment & Plan Note (Signed)
[name] is here today for follow-up of her stage IIIa triple negative breast cancer diagnosed in November 2022.  She is status post neoadjuvant chemotherapy, right lumpectomy, and maintenance Keytruda most recently received August 19, 2022.  [name] has no signs of breast cancer recurrence today.  She is concerned about some of the side effects with the [facility] and about her appetite loss and whether it could be related.  I recommended that she hold the Keytruda today undergo her colonoscopy tomorrow and she will return with [place] in 3 weeks to see how she is feeling.  She asked for more information on [employer] testing and I gave her our educational handouts about this testing.  Dr. Lindi Adie will review with her when it is appropriate to test because he recommends that testing start when she finishes treatment.  I looked at the area on Jillayne's back and I am concerned that the pigmentation loss to her skin may be permanent.  I recommended that she continue applying Shea butter or a cream over the area.  Keirah will return in 3 weeks for labs, follow-up with Dr. Lindi Adie, and her next [facility].

## 2022-09-30 NOTE — Progress Notes (Signed)
[facility] Follow up:    Sydney Rasmussen, MD [address] [place] [address]   DIAGNOSIS:  Cancer Staging  Malignant neoplasm of upper-outer quadrant of right breast in female, estrogen receptor negative ([place]) Staging form: Breast, AJCC 8th Edition - Clinical stage from 11/11/2021: Stage IIIA (cT3, cN1, cM0, G3, ER+, PR+, HER2-) - Signed by Nicholas Lose, MD on 02/18/2022 Stage prefix: Initial diagnosis Histologic grading system: 3 grade system   SUMMARY OF ONCOLOGIC HISTORY: Oncology History  Malignant neoplasm of upper-outer quadrant of right breast in female, estrogen receptor negative ([place])  11/04/2021 Initial Diagnosis    Patient has been breast-feeding.  Felt a painful right breast mass and an axillary mass.  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 with a Ki-67 of 40%, 1 abnormal lymph node 5.8 cm biopsy: Grade 3 IDC ER 5%, PR 0%, HER2 negative, Ki-67 40%   11/11/2021 Cancer Staging   Staging form: Breast, AJCC 8th Edition - Clinical stage from 11/11/2021: Stage IIIA (cT3, cN1, cM0, G3, ER+, PR+, HER2-) - Signed by Nicholas Lose, MD on 02/18/2022 Stage prefix: Initial diagnosis Histologic grading system: 3 grade system   11/30/2021 - 04/29/2022 Chemotherapy   Patient is on Treatment Plan : BREAST Pembrolizumab + Carboplatin D1 + Paclitaxel D1,8,15 q21d X 4 cycles / Pembrolizumab + AC q21d x 4 cycles     11/30/2021 Genetic Testing   Negative genetic testing on the CancerNext-Expanded+RNA insight panel.  The report date is 11/30/2021  The CancerNext-Expanded gene panel offered by [facility] and includes sequencing and rearrangement analysis for the following 77 genes: AIP, ALK, APC*, ATM*, AXIN2, BAP1, BARD1, BLM, BMPR1A, BRCA1*, BRCA2*, BRIP1*, CDC73, CDH1*, CDK4, CDKN1B, CDKN2A, CHEK2*, CTNNA1, DICER1, FANCC, FH, FLCN, GALNT12, KIF1B, LZTR1, MAX, MEN1, MET, MLH1*, MSH2*, MSH3, MSH6*, MUTYH*, NBN,  NF1*, NF2, NTHL1, PALB2*, PHOX2B, PMS2*, POT1, PRKAR1A, PTCH1, PTEN*, RAD51C*, RAD51D*, RB1, RECQL, RET, SDHA, SDHAF2, SDHB, SDHC, SDHD, SMAD4, SMARCA4, SMARCB1, SMARCE1, STK11, SUFU, TMEM127, TP53*, TSC1, TSC2, VHL and XRCC2 (sequencing and deletion/duplication); EGFR, EGLN1, HOXB13, KIT, MITF, PDGFRA, POLD1, and POLE (sequencing only); EPCAM and GREM1 (deletion/duplication only). DNA and RNA analyses performed for * genes.    05/18/2022 -  Chemotherapy   Patient is on Treatment Plan : BREAST Pembrolizumab (200) q21d x 27 weeks     06/18/2022 Surgery   Right lumpectomy: No residual invasive or in situ carcinoma.  0/5 lymph nodes     CURRENT THERAPY: Keytruda  INTERVAL HISTORY: Sydney Rios 41 y.o. female returns for follow-up today of her Keytruda.  Her she most recently received this on August 24.  She notes that since that time she has had no further mucus in her stool and wonders if the Keytruda is causing the mucus.  She also is concerned about receiving the Keytruda prior to her colonoscopy tomorrow.  She has a decreased appetite this been going on over the past several weeks and is focusing on eating plenty of nutrients but does endorse some weight loss of about 5 pounds.  She denies any new pain.  She wants to know how long the skin discoloration on her back after radiation will stay.   Patient Active Problem List   Diagnosis Date Noted   Genetic testing 12/01/2021   Port-A-Cath in place 11/30/2021   Malignant neoplasm of upper-outer quadrant of right breast in female, estrogen receptor negative ([place]) 11/11/2021   Abnormal maternal glucose tolerance,  antepartum 09/15/2018   S/P cesarean section 10/11/2014   TACHYCARDIA 05/01/2010    has No Known Allergies.  MEDICAL HISTORY: Past Medical History:  Diagnosis Date   Anxiety    Chronic kidney disease    hx kidney stones   Complication of anesthesia    runs heart rate in the 30-40s in PACU   History of kidney stones    Hx  gestational diabetes    Hx of Hashimoto thyroiditis    PONV (postoperative nausea and vomiting)     SURGICAL HISTORY: Past Surgical History:  Procedure Laterality Date   CESAREAN SECTION     CESAREAN SECTION  01/23/2013   Procedure: CESAREAN SECTION;  Surgeon: Luz Lex, MD;  Location: [facility];  Service: Obstetrics;  Laterality: N/A;   CESAREAN SECTION N/A 10/11/2014   Procedure: CESAREAN SECTION;  Surgeon: Cyril Mourning, MD;  Location: [facility];  Service: Obstetrics;  Laterality: N/A;   CESAREAN SECTION N/A 10/24/2018   Procedure: REPEAT CESAREAN SECTION;  Surgeon: Dian Queen, MD;  Location: [facility];  Service: Obstetrics;  Laterality: N/A;  Tracey RNFA   LITHOTRIPSY     PORTACATH PLACEMENT Left 11/25/2021   Procedure: PORT PLACEMENT;  Surgeon: Rolm Bookbinder, MD;  Location: [facility];  Service: General;  Laterality: Left;   WISDOM TOOTH EXTRACTION      SOCIAL HISTORY: Social History   Socioeconomic History   Marital status: Married    Spouse name: Not on file   Number of children: Not on file   Years of education: Not on file   Highest education level: Not on file  Occupational History   Not on file  Tobacco Use   Smoking status: Never   Smokeless tobacco: Never  Vaping Use   Vaping Use: Never used  Substance and Sexual Activity   Alcohol use: No   Drug use: No   Sexual activity: Yes    Birth control/protection: Other-see comments    Comment: Husband has had a vasectomy  Other Topics Concern   Not on file  Social History Narrative   Not on file   Social Determinants of Health   Financial Resource Strain: Low Risk  (10/09/2018)   Overall Financial Resource Strain (CARDIA)    Difficulty of Paying Living Expenses: Not hard at all  Food Insecurity: No Food Insecurity (10/09/2018)   Hunger Vital Sign    Worried About Running Out of Food in the Last Year: Never true    [place] in the Last Year: Never true  Transportation Needs: Unknown  (10/09/2018)   PRAPARE - Hydrologist (Medical): No    Lack of Transportation (Non-Medical): Not on file  Physical Activity: Not on file  Stress: No Stress Concern Present (10/09/2018)   [place]    Feeling of Stress : Only a little  Social Connections: Not on file  Intimate Partner Violence: Not At Risk (10/09/2018)   Humiliation, Afraid, Rape, and Kick questionnaire    Fear of Current or Ex-Partner: No    Emotionally Abused: No    Physically Abused: No    Sexually Abused: No    FAMILY HISTORY: Family History  Problem Relation Age of Onset   Hypertension Mother    Diabetes Mother    Hypertension Father    Hypertension Maternal Grandmother    Diabetes Maternal Grandmother    Heart disease Maternal Grandfather    Hypertension Maternal Grandfather  Heart disease Paternal Grandfather    Hypertension Paternal Grandfather    Retinoblastoma Cousin        dx 3 mo - left eye    Review of Systems  Constitutional:  Positive for appetite change. Negative for chills, fatigue, fever and unexpected weight change.  HENT:   Negative for hearing loss, lump/mass and trouble swallowing.   Eyes:  Negative for eye problems and icterus.  Respiratory:  Negative for chest tightness, cough and shortness of breath.   Cardiovascular:  Negative for chest pain, leg swelling and palpitations.  Gastrointestinal:  Negative for abdominal distention, abdominal pain, constipation, diarrhea, nausea and vomiting.  Endocrine: Negative for hot flashes.  Genitourinary:  Negative for difficulty urinating.   Musculoskeletal:  Negative for arthralgias.  Skin:  Negative for itching and rash.  Neurological:  Negative for dizziness, extremity weakness, headaches and numbness.  Hematological:  Negative for adenopathy. Does not bruise/bleed easily.  Psychiatric/Behavioral:  Negative for depression. The patient is not  nervous/anxious.       PHYSICAL EXAMINATION  ECOG PERFORMANCE STATUS: 1 - Symptomatic but completely ambulatory  Vitals:   09/30/22 1059  BP: 118/60  Pulse: 70  Resp: 14  Temp: (!) 97.5 F (36.4 C)  SpO2: 100%    Physical Exam Constitutional:      General: She is not in acute distress.    Appearance: Normal appearance. She is not toxic-appearing.  HENT:     Head: Normocephalic and atraumatic.  Eyes:     General: No scleral icterus. Cardiovascular:     Rate and Rhythm: Normal rate and regular rhythm.     Pulses: Normal pulses.     Heart sounds: Normal heart sounds.  Pulmonary:     Effort: Pulmonary effort is normal.     Breath sounds: Normal breath sounds.  Abdominal:     General: Abdomen is flat. Bowel sounds are normal. There is no distension.     Palpations: Abdomen is soft.     Tenderness: There is no abdominal tenderness.  Musculoskeletal:        General: No swelling.     Cervical back: Neck supple.  Lymphadenopathy:     Cervical: No cervical adenopathy.  Skin:    General: Skin is warm and dry.     Findings: No rash.     Comments: On her right upper back there is an area of pigmentation loss over her skin.  Neurological:     General: No focal deficit present.     Mental Status: She is alert.  Psychiatric:        Mood and Affect: Mood normal.        Behavior: Behavior normal.     LABORATORY DATA:  CBC    Component Value Date/Time   WBC 2.9 (L) 09/30/2022 1039   WBC 9.6 10/25/2018 0515   RBC 3.55 (L) 09/30/2022 1039   HGB 11.1 (L) 09/30/2022 1039   HCT 32.9 (L) 09/30/2022 1039   PLT 194 09/30/2022 1039   MCV 92.7 09/30/2022 1039   MCH 31.3 09/30/2022 1039   MCHC 33.7 09/30/2022 1039   RDW 14.1 09/30/2022 1039   LYMPHSABS 0.5 (L) 09/30/2022 1039   MONOABS 0.3 09/30/2022 1039   EOSABS 0.1 09/30/2022 1039   BASOSABS 0.0 09/30/2022 1039    CMP     Component Value Date/Time   NA 138 09/30/2022 1039   K 3.8 09/30/2022 1039   CL 105  09/30/2022 1039   CO2 29 09/30/2022 1039  GLUCOSE 122 (H) 09/30/2022 1039   BUN 9 09/30/2022 1039   CREATININE 0.74 09/30/2022 1039   CALCIUM 9.3 09/30/2022 1039   PROT 7.2 09/30/2022 1039   ALBUMIN 4.4 09/30/2022 1039   AST 16 09/30/2022 1039   ALT 13 09/30/2022 1039   ALKPHOS 47 09/30/2022 1039   BILITOT 0.3 09/30/2022 1039   GFRNONAA >60 09/30/2022 1039   GFRAA >60 10/22/2018 2142        ASSESSMENT and THERAPY PLAN:   Malignant neoplasm of upper-outer quadrant of right breast in female, estrogen receptor negative ([place]) Sydney Rios is here today for follow-up of her stage IIIa triple negative breast cancer diagnosed in November 2022.  She is status post neoadjuvant chemotherapy, right lumpectomy, and maintenance Keytruda most recently received August 19, 2022.  Sydney Rios has no signs of breast cancer recurrence today.  She is concerned about some of the side effects with the [facility] and about her appetite loss and whether it could be related.  I recommended that she hold the Keytruda today undergo her colonoscopy tomorrow and she will return with [place] in 3 weeks to see how she is feeling.  She asked for more information on [employer] testing and I gave her our educational handouts about this testing.  Dr. Lindi Adie will review with her when it is appropriate to test because he recommends that testing start when she finishes treatment.  I looked at the area on Sydney Rios's back and I am concerned that the pigmentation loss to her skin may be permanent.  I recommended that she continue applying Shea butter or a cream over the area.  Sydney Rios will return in 3 weeks for labs, follow-up with Dr. Lindi Adie, and her next [facility].   All questions were answered. The patient knows to call the clinic with any problems, questions or concerns. We can certainly see the patient much sooner if necessary.  Total encounter time:20 minutes*in face-to-face visit time, chart review, lab review, care coordination, order entry,  and documentation of the encounter time.   Wilber Bihari, NP 09/30/22 11:39 AM Medical Oncology and Hematology [facility] [place], [place] [address] Tel. [phone]    Fax. [phone]  *Total Encounter Time as defined by the Centers for Medicare and Medicaid Services includes, in addition to the face-to-face time of a patient visit (documented in the note above) non-face-to-face time: obtaining and reviewing outside history, ordering and reviewing medications, tests or procedures, care coordination (communications with other health care professionals or caregivers) and documentation in the medical record.

## 2022-10-01 LAB — T4: T4, Total: 4 ug/dL — ABNORMAL LOW (ref 4.5–12.0)

## 2022-10-04 ENCOUNTER — Ambulatory Visit: Payer: 59

## 2022-10-05 ENCOUNTER — Ambulatory Visit: Payer: 59 | Attending: Hematology and Oncology

## 2022-10-05 DIAGNOSIS — R600 Localized edema: Secondary | ICD-10-CM | POA: Insufficient documentation

## 2022-10-05 DIAGNOSIS — Z171 Estrogen receptor negative status [ER-]: Secondary | ICD-10-CM | POA: Diagnosis present

## 2022-10-05 DIAGNOSIS — M25611 Stiffness of right shoulder, not elsewhere classified: Secondary | ICD-10-CM | POA: Insufficient documentation

## 2022-10-05 DIAGNOSIS — C50211 Malignant neoplasm of upper-inner quadrant of right female breast: Secondary | ICD-10-CM | POA: Diagnosis present

## 2022-10-05 DIAGNOSIS — Z483 Aftercare following surgery for neoplasm: Secondary | ICD-10-CM | POA: Insufficient documentation

## 2022-10-05 DIAGNOSIS — R293 Abnormal posture: Secondary | ICD-10-CM | POA: Diagnosis present

## 2022-10-05 NOTE — Addendum Note (Signed)
Addended by: Wynelle Beckmann, Hilaria Ota L on: 10/05/2022 12:17 PM   Modules accepted: Orders

## 2022-10-05 NOTE — Therapy (Addendum)
OUTPATIENT PHYSICAL THERAPY TREATMENT NOTE   Patient Name: Sydney Rios MRN: [id number] DOB:July 01, 1981, 41 y.o., female Today's Date: 10/05/2022  PCP:  Horald Pollen, MD REFERRING PROVIDER: Nicholas Lose, MD  END OF SESSION:   PT End of Session - 10/05/22 1019     Visit Number 8    Number of Visits 20    Date for PT Re-Evaluation 11/02/22    PT Start Time 1016   pt arrived late   PT Stop Time 1058    PT Time Calculation (min) 42 min    Activity Tolerance Patient tolerated treatment well    Behavior During Therapy WFL for tasks assessed/performed             Past Medical History:  Diagnosis Date   Anxiety    Chronic kidney disease    hx kidney stones   Complication of anesthesia    runs heart rate in the 30-40s in PACU   History of kidney stones    Hx gestational diabetes    Hx of Hashimoto thyroiditis    PONV (postoperative nausea and vomiting)    Past Surgical History:  Procedure Laterality Date   CESAREAN SECTION     CESAREAN SECTION  01/23/2013   Procedure: CESAREAN SECTION;  Surgeon: Luz Lex, MD;  Location: [facility];  Service: Obstetrics;  Laterality: N/A;   CESAREAN SECTION N/A 10/11/2014   Procedure: CESAREAN SECTION;  Surgeon: Cyril Mourning, MD;  Location: [facility];  Service: Obstetrics;  Laterality: N/A;   CESAREAN SECTION N/A 10/24/2018   Procedure: REPEAT CESAREAN SECTION;  Surgeon: Dian Queen, MD;  Location: [facility];  Service: Obstetrics;  Laterality: N/A;  Tracey RNFA   LITHOTRIPSY     PORTACATH PLACEMENT Left 11/25/2021   Procedure: PORT PLACEMENT;  Surgeon: Rolm Bookbinder, MD;  Location: [facility];  Service: General;  Laterality: Left;   WISDOM TOOTH EXTRACTION     Patient Active Problem List   Diagnosis Date Noted   Genetic testing 12/01/2021   Port-A-Cath in place 11/30/2021   Malignant neoplasm of upper-outer quadrant of right breast in female, estrogen receptor negative ([place]) 11/11/2021   Abnormal maternal glucose  tolerance, antepartum 09/15/2018   S/P cesarean section 10/11/2014   TACHYCARDIA 05/01/2010    REFERRING DIAG: C50.411,Z17.1 (ICD-10-CM) - Malignant neoplasm of upper-outer quadrant of right breast in female, estrogen receptor negative ([place])  THERAPY DIAG:  Stiffness of right shoulder, not elsewhere classified  Aftercare following surgery for neoplasm  Localized edema  Abnormal posture  Malignant neoplasm of upper-inner quadrant of right breast in female, estrogen receptor negative ([place])  Rationale for Evaluation and Treatment Rehabilitation  PERTINENT HISTORY: Patient was diagnosed on 11/05/2021 with right grade III invasive ductal carcinoma breast cancer. She has a large axillary lymph node that was biopsied and positive for disease. It is triple negative with a Ki67 of 40%. Completed chemo 11/30/21-04/29/22. 06/18/22- R lumpectomy 0/5 lymph nodes  PRECAUTIONS:  Other: at risk for lymphedema on R side  SUBJECTIVE: My Rt breast is doing much better since radiation ended. I've continued with the self MLD prn. I can tell my shoulder is tighter and I can feel a cord pulling in my upper arm though since radiation. I'd like to come back a few times to get my arm feeling better again.     PAIN:  Are you having pain? No   OBJECTIVE: (objective measures completed at initial evaluation unless otherwise dated)   PALPATION: Some increased scar tissue/seroma palpable in  R axilla   OBSERVATIONS / OTHER ASSESSMENTS: some swelling present on inferior breast as evidenced by increased pore size   POSTURE: forward head, rounded shoulders   UPPER EXTREMITY AROM/PROM:   A/PROM RIGHT   eval   08/03/22 08/10/2022 09/08/22 10/05/22  Shoulder extension 62  67  54 pull  Shoulder flexion 122 167 - axilla pull 168 mild pull 165 mild pull 139 pull in arm  Shoulder abduction 96 175 - no pull  180 182 175  Shoulder internal rotation 63    WNLs  Shoulder external rotation 88  110  WNLs                           (Blank rows = not tested)   A/PROM LEFT   eval  Shoulder extension 68  Shoulder flexion 160  Shoulder abduction 175  Shoulder internal rotation 63  Shoulder external rotation 87                          (Blank rows = not tested)       LYMPHEDEMA ASSESSMENTS:   SURGERY TYPE/DATE: R lumpectomy and SLNB 06/18/22  NUMBER OF LYMPH NODES REMOVED: 5 - all negative  CHEMOTHERAPY: completed and currently on Keytruda  RADIATION:will need in the future  HORMONE TREATMENT: pt does not require  INFECTIONS: none   LYMPHEDEMA ASSESSMENTS:    LANDMARK RIGHT  eval  10 cm proximal to olecranon process 26.5  Olecranon process 24.5  10 cm proximal to ulnar styloid process 19.1  Just proximal to ulnar styloid process 14.7  Across hand at thumb web space 18.6  At base of 2nd digit 5.6  (Blank rows = not tested)   LANDMARK LEFT  eval  10 cm proximal to olecranon process 26.2  Olecranon process 25  10 cm proximal to ulnar styloid process 19.3  Just proximal to ulnar styloid process 14.6  Across hand at thumb web space 18.5  At base of 2nd digit 5.5  (Blank rows = not tested)     QUICK DASH SURVEY: 16%              TODAY'S TREATMENT  10/05/22: Manual Therapy P/ROM in supine to Rt shoulder into flexion, abd and D2 to pts end available end ROM with scapular depression applied throughout due to muscle tightness  Soft tissue mobs performed in Rt axilla and into upper arm where tightness palpated that is now limiting her end A/ROM STM in Rt S/L to medial scapular border with trigger point release Remeasured pts A/ROM of Rt shoulder, see chart  09/08/22: Manual Therapy MLD: In Supine short neck, 5 diaphragmatic breaths, Lt axillary and Rt inguinal nodes, anterior inter-axillary and Rt axillo-inguinal anastomosis, then focused on Rt medial, with pt permission and appropriate draping throughout. P/ROM in supine to Rt shoulder into flexion and abd to pts end available end ROM with  scapular depression applied throughout due to muscle tightness and blocking in axilla above radiated area to prevent pull on radiated tissue as her skin is peeling in axilla Soft tissue mobs performed in Rt axilla above radiated area to scar tissue  08/23/22: MLD: In Supine short neck, 5 diaphragmatic breaths, Lt axillary and Rt inguinal nodes, anterior inter-axillary and Rt axillo-inguinal anastomosis, then focused on Rt medial, then into Lt S/L for lateral breast redirecting towards Rt axillo-inguinal anastomosis with pt permission and appropriate draping throughout. P/ROM in supine to Rt  shoulder into flexion, abd and D3 to pts end available end ROM with scapular depression applied throughout due to muscle tightness Soft tissue mobs performed in Lt S/L to right UT and levator with cocoa butter where increased muscle tightness palpated,then removed with alcohol wipes prior to going to radiation     PATIENT EDUCATION:  Education details: post op exercises, axillary cording, truncal stretch Person educated: Patient Education method: Explanation, Demonstration, Verbal cues, and Handouts Education comprehension: verbalized understanding     HOME EXERCISE PROGRAM: Post op exercises  Access Code: [email or web address] URL: [email or web address] Date: 08/17/2022 Prepared by: Manus Gunning  Exercises - Supine Shoulder External Rotation Stretch  - 1 x daily - 7 x weekly - 1 sets - 5-10 reps - 15-30 seconds hold - Supine Lower Trunk Rotation  - 1 x daily - 7 x weekly - 1 sets - 5-10 reps - 15-30 sec  hold - Seated Upper Trap Stretch  - 1 x daily - 7 x weekly - 1 sets - 5-10 reps - 15-30 hold - Seated Levator Scapulae Stretch (Mirrored)  - 1 x daily - 7 x weekly - 1 sets - 5-10 reps - 15-30 hold  ASSESSMENT:   CLINICAL IMPRESSION: Pt returns to physical therapy after having completed radiation almost 1 month ago. She reports much improvement with her breast lymphedema but feels her Rt  shoulder A/ROM is now limited with cording at posterior upper arm limiting her end motions. Her A/ROM has reduced since last measured so she will benefit from continued physical therapy at this time to help her regain her ROM and decrease tightness from cording in posterior upper arm. Pt would like to come 1x/wk for up to 4 more weeks.    OBJECTIVE IMPAIRMENTS decreased ROM, increased edema, increased fascial restrictions, increased muscle spasms, impaired UE functional use, and postural dysfunction.    ACTIVITY LIMITATIONS lifting and reach over head   PARTICIPATION LIMITATIONS:  none   PERSONAL FACTORS  none  are also affecting patient's functional outcome.    REHAB POTENTIAL: Excellent   CLINICAL DECISION MAKING: Stable/uncomplicated   EVALUATION COMPLEXITY: Low   GOALS: Goals reviewed with patient? Yes   SHORT TERM GOALS=LONG TERM GOALS  Target date: 11/02/2022     Pt will demonstrate 160 degrees of R shoulder flexion to allow her to reach overhead.  Baseline: 122; 165 degrees - 09/08/22; 139 degrees - 10/05/22 Goal status: ONGOING   2.  Pt will demonstrate 165 degrees of L shoulder abduction to allow her to reach up and out to the side.  Baseline: 96; 182 degrees - 09/08/22; 175 degrees - 10/05/22 Goal status: MET   3.  Pt will report she is no longer having rib pain to allow her improved comfort.  Baseline:  Goal status: MET   4.  Pt will not demonstrate any increase in pore size in inferior breast to decrease risk of cellulitis.  Baseline: Pore size still present as pt has had increased lymphedema present due to currently undergoing radiation - 09/08/22; pore size much improved - 10/05/22 Goal status: MET   5.  Pt will be independent in a home exercise program for continued stretching and strengthening.  Baseline: Pt is independent with current HEP - 09/08/22 Goal status: ONGOING   PLAN: PT FREQUENCY: 1-2 x/week   PT DURATION: 4 weeks   PLANNED INTERVENTIONS:  Therapeutic exercises, Therapeutic activity, Patient/Family education, Self Care, Orthotic/Fit training, Manual lymph drainage, Compression bandaging, scar mobilization, Taping, Vasopneumatic device, and Manual therapy,  dry needling   PLAN FOR NEXT SESSION: Do SOZO next. Renewal done this visit. Progress towards goal of improving her Rt shoulder end ROM and manual techniques to support this    Otelia Limes, PTA 10/05/2022, 11:09 AM

## 2022-10-20 NOTE — Progress Notes (Unsigned)
Patient Care Team: Dois Davenport, MD as PCP - General (Family Medicine) Pershing Proud, RN as Oncology Nurse Navigator Donnelly Angelica, RN as Oncology Nurse Navigator Serena Croissant, MD as Attending Physician (Hematology and Oncology)  DIAGNOSIS: No diagnosis found.  SUMMARY OF ONCOLOGIC HISTORY: Oncology History  Malignant neoplasm of upper-outer quadrant of right breast in female, estrogen receptor negative (HCC)  11/04/2021 Initial Diagnosis    Patient has been breast-feeding.  Felt a painful right breast mass and an axillary mass.  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 with a Ki-67 of 40%, 1 abnormal lymph node 5.8 cm biopsy: Grade 3 IDC ER 5%, PR 0%, HER2 negative, Ki-67 40%   11/11/2021 Cancer Staging   Staging form: Breast, AJCC 8th Edition - Clinical stage from 11/11/2021: Stage IIIA (cT3, cN1, cM0, G3, ER+, PR+, HER2-) - Signed by Serena Croissant, MD on 02/18/2022 Stage prefix: Initial diagnosis Histologic grading system: 3 grade system   11/30/2021 - 04/29/2022 Chemotherapy   Patient is on Treatment Plan : BREAST Pembrolizumab + Carboplatin D1 + Paclitaxel D1,8,15 q21d X 4 cycles / Pembrolizumab + AC q21d x 4 cycles     11/30/2021 Genetic Testing   Negative genetic testing on the CancerNext-Expanded+RNA insight panel.  The report date is 11/30/2021  The CancerNext-Expanded gene panel offered by [facility] and includes sequencing and rearrangement analysis for the following 77 genes: AIP, ALK, APC*, ATM*, AXIN2, BAP1, BARD1, BLM, BMPR1A, BRCA1*, BRCA2*, BRIP1*, CDC73, CDH1*, CDK4, CDKN1B, CDKN2A, CHEK2*, CTNNA1, DICER1, FANCC, FH, FLCN, GALNT12, KIF1B, LZTR1, MAX, MEN1, MET, MLH1*, MSH2*, MSH3, MSH6*, MUTYH*, NBN, NF1*, NF2, NTHL1, PALB2*, PHOX2B, PMS2*, POT1, PRKAR1A, PTCH1, PTEN*, RAD51C*, RAD51D*, RB1, RECQL, RET, SDHA, SDHAF2, SDHB, SDHC, SDHD, SMAD4, SMARCA4, SMARCB1, SMARCE1, STK11, SUFU, TMEM127, TP53*, TSC1, TSC2, VHL and  XRCC2 (sequencing and deletion/duplication); EGFR, EGLN1, HOXB13, KIT, MITF, PDGFRA, POLD1, and POLE (sequencing only); EPCAM and GREM1 (deletion/duplication only). DNA and RNA analyses performed for * genes.    05/18/2022 -  Chemotherapy   Patient is on Treatment Plan : BREAST Pembrolizumab (200) q21d x 27 weeks     06/18/2022 Surgery   Right lumpectomy: No residual invasive or in situ carcinoma.  0/5 lymph nodes     CHIEF COMPLIANT: Follow-up Right lumpectomy  INTERVAL HISTORY: Sydney Rios is a 41 y.o with the above-mentioned right lumpectomy currently on Kadcyla. She presents to the clinic for a follow-up.    ALLERGIES:  has No Known Allergies.  MEDICATIONS:  Current Outpatient Medications  Medication Sig Dispense Refill   acyclovir ointment (ZOVIRAX) 5 % 1 APPLICATION TOPICALLY TO AFFECTED AREA 6 TIMES PER DAY EVERY 3 HOURS     Ascorbic Acid (VITAMIN C) 1000 MG tablet Take 2,000 mg by mouth every 30 (thirty) days.     cholecalciferol (VITAMIN D3) 25 MCG (1000 UNIT) tablet Take 10,000 Units by mouth 3 (three) times a week.     levothyroxine (SYNTHROID) 150 MCG tablet TAKE 1 TABLET BY MOUTH EVERY DAY BEFORE BREAKFAST 30 tablet 1   lidocaine-prilocaine (EMLA) cream APPLY TO AFFECTED AREA ONCE AS DIRECTED     MISC NATURAL PRODUCTS PO Take by mouth daily. berberine/herbal complex no.18 (BERBERINE-HERBAL COMB NO.18 ORAL)     Pectin Cit-Inos-C-Bioflav-Soy (MODIFIED CITRUS PECTIN PO) Take 1 tablet by mouth daily.     tretinoin (RETIN-A) 0.025 % cream SMARTSIG:sparingly Topical Every Night     valACYclovir (VALTREX) 1000 MG tablet Take 1 tablet (1,000 mg  total) by mouth 3 (three) times daily. 30 tablet 0   No current facility-administered medications for this visit.    PHYSICAL EXAMINATION: ECOG PERFORMANCE STATUS: {CHL ONC ECOG PS:[phone]}  There were no vitals filed for this visit. There were no vitals filed for this visit.  BREAST:*** No palpable masses or nodules in either  right or left breasts. No palpable axillary supraclavicular or infraclavicular adenopathy no breast tenderness or nipple discharge. (exam performed in the presence of a chaperone)  LABORATORY DATA:  I have reviewed the data as listed    Latest Ref Rng & Units 09/30/2022   10:39 AM 08/18/2022    2:51 PM 07/22/2022    1:58 PM  CMP  Glucose 70 - 99 mg/dL 122  106  171   BUN 6 - 20 mg/dL $Remove'9  14  17   'YlZQpfe$ Creatinine 0.44 - 1.00 mg/dL 0.74  0.84  0.83   Sodium 135 - 145 mmol/L 138  142  138   Potassium 3.5 - 5.1 mmol/L 3.8  3.9  3.9   Chloride 98 - 111 mmol/L 105  112  108   CO2 22 - 32 mmol/L $RemoveB'29  24  26   'YuyjnYqg$ Calcium 8.9 - 10.3 mg/dL 9.3  9.6  9.1   Total Protein 6.5 - 8.1 g/dL 7.2  6.6  6.7   Total Bilirubin 0.3 - 1.2 mg/dL 0.3  0.2  0.3   Alkaline Phos 38 - 126 U/L 47  46  56   AST 15 - 41 U/L $Remo'16  17  12   'wNlit$ ALT 0 - 44 U/L $Remo'13  14  9     'lAuXJ$ Lab Results  Component Value Date   WBC 2.9 (L) 09/30/2022   HGB 11.1 (L) 09/30/2022   HCT 32.9 (L) 09/30/2022   MCV 92.7 09/30/2022   PLT 194 09/30/2022   NEUTROABS 2.0 09/30/2022    ASSESSMENT & PLAN:  No problem-specific Assessment & Plan notes found for this encounter.    No orders of the defined types were placed in this encounter.  The patient has a good understanding of the overall plan. she agrees with it. she will call with any problems that may develop before the next visit here. Total time spent: 30 mins including face to face time and time spent for planning, charting and co-ordination of care   Suzzette Righter, [place] 10/20/22    I Gardiner Coins am scribing for Dr. Lindi Adie  ***

## 2022-10-21 ENCOUNTER — Inpatient Hospital Stay (HOSPITAL_BASED_OUTPATIENT_CLINIC_OR_DEPARTMENT_OTHER): Payer: 59 | Admitting: Hematology and Oncology

## 2022-10-21 ENCOUNTER — Inpatient Hospital Stay: Payer: 59

## 2022-10-21 ENCOUNTER — Other Ambulatory Visit: Payer: Self-pay | Admitting: *Deleted

## 2022-10-21 ENCOUNTER — Other Ambulatory Visit: Payer: Self-pay

## 2022-10-21 DIAGNOSIS — Z171 Estrogen receptor negative status [ER-]: Secondary | ICD-10-CM | POA: Diagnosis not present

## 2022-10-21 DIAGNOSIS — C50411 Malignant neoplasm of upper-outer quadrant of right female breast: Secondary | ICD-10-CM | POA: Diagnosis not present

## 2022-10-21 DIAGNOSIS — Z95828 Presence of other vascular implants and grafts: Secondary | ICD-10-CM

## 2022-10-21 LAB — CBC WITH DIFFERENTIAL (CANCER CENTER ONLY)
Abs Immature Granulocytes: 0 10*3/uL (ref 0.00–0.07)
Basophils Absolute: 0 10*3/uL (ref 0.0–0.1)
Basophils Relative: 0 %
Eosinophils Absolute: 0 10*3/uL (ref 0.0–0.5)
Eosinophils Relative: 1 %
HCT: 30.6 % — ABNORMAL LOW (ref 36.0–46.0)
Hemoglobin: 10.2 g/dL — ABNORMAL LOW (ref 12.0–15.0)
Immature Granulocytes: 0 %
Lymphocytes Relative: 21 %
Lymphs Abs: 0.6 10*3/uL — ABNORMAL LOW (ref 0.7–4.0)
MCH: 30.8 pg (ref 26.0–34.0)
MCHC: 33.3 g/dL (ref 30.0–36.0)
MCV: 92.4 fL (ref 80.0–100.0)
Monocytes Absolute: 0.3 10*3/uL (ref 0.1–1.0)
Monocytes Relative: 11 %
Neutro Abs: 1.8 10*3/uL (ref 1.7–7.7)
Neutrophils Relative %: 67 %
Platelet Count: 205 10*3/uL (ref 150–400)
RBC: 3.31 MIL/uL — ABNORMAL LOW (ref 3.87–5.11)
RDW: 13.7 % (ref 11.5–15.5)
WBC Count: 2.7 10*3/uL — ABNORMAL LOW (ref 4.0–10.5)
nRBC: 0 % (ref 0.0–0.2)

## 2022-10-21 LAB — CMP (CANCER CENTER ONLY)
ALT: 9 U/L (ref 0–44)
AST: 13 U/L — ABNORMAL LOW (ref 15–41)
Albumin: 4 g/dL (ref 3.5–5.0)
Alkaline Phosphatase: 51 U/L (ref 38–126)
Anion gap: 4 — ABNORMAL LOW (ref 5–15)
BUN: 12 mg/dL (ref 6–20)
CO2: 27 mmol/L (ref 22–32)
Calcium: 9.3 mg/dL (ref 8.9–10.3)
Chloride: 108 mmol/L (ref 98–111)
Creatinine: 0.59 mg/dL (ref 0.44–1.00)
GFR, Estimated: >60 mL/min (ref >60)
Glucose, Bld: 105 mg/dL — ABNORMAL HIGH (ref 70–99)
Potassium: 4.3 mmol/L (ref 3.5–5.1)
Sodium: 139 mmol/L (ref 135–145)
Total Bilirubin: 0.3 mg/dL (ref 0.3–1.2)
Total Protein: 6.5 g/dL (ref 6.5–8.1)

## 2022-10-21 LAB — TSH: TSH: 3.576 u[IU]/mL (ref 0.350–4.500)

## 2022-10-21 MED ORDER — SODIUM CHLORIDE 0.9% FLUSH
10.0000 mL | Freq: Once | INTRAVENOUS | Status: AC
  Administered 2022-10-21: 10 mL

## 2022-10-21 NOTE — Assessment & Plan Note (Addendum)
11/04/2021:Patient has been breast-feeding. Felt a painful right breast mass and an axillary mass. 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 Neoadjuvant chemotherapy with Taxol weekly 12 withKeytruda andcarboplatin every 3 weeks x4 Adriamycin and Cytoxan, Keytrudadose dense 4 followed by followed by Beryle Flock maintenance for 1 year 2.06/18/2022:Right lumpectomy(at [facility]): No residual invasive or in situ carcinoma. 0/5 lymph nodes 3. Followed by adjuvant radiation therapy to be completed September 2023. --------------------------------------------------------------------------------------------------------------------------- Current Treatment:Keytrudamaintenance being discontinued by patient preference  Today we had a lengthy discussion about continuation of maintenance therapy versus discontinuing.  She is on the opinion that she does not want to continue it any further because of her thyroid issues.    She went on a complementary beach vacation provided by [employer] [place].  Return to clinic in 3 months for survivorship care plan visit.

## 2022-10-21 NOTE — Progress Notes (Signed)
Per MD request RN faxed [employer] request [phone]).

## 2022-10-22 ENCOUNTER — Ambulatory Visit (HOSPITAL_COMMUNITY)
Admission: RE | Admit: 2022-10-22 | Discharge: 2022-10-22 | Disposition: A | Discharge status: Home / Self Care | Payer: 59 | Source: Ambulatory Visit | Attending: Hematology and Oncology | Admitting: Hematology and Oncology

## 2022-10-22 ENCOUNTER — Other Ambulatory Visit: Payer: Self-pay | Admitting: Hematology and Oncology

## 2022-10-22 ENCOUNTER — Encounter (HOSPITAL_COMMUNITY): Payer: Self-pay

## 2022-10-22 DIAGNOSIS — Z171 Estrogen receptor negative status [ER-]: Secondary | ICD-10-CM | POA: Diagnosis present

## 2022-10-22 DIAGNOSIS — C50411 Malignant neoplasm of upper-outer quadrant of right female breast: Secondary | ICD-10-CM | POA: Insufficient documentation

## 2022-10-22 HISTORY — DX: Malignant (primary) neoplasm, unspecified: C80.1

## 2022-10-22 MED ORDER — IOHEXOL 300 MG/ML  SOLN
100.0000 mL | Freq: Once | INTRAMUSCULAR | Status: AC | PRN
  Administered 2022-10-22: 100 mL via INTRAVENOUS

## 2022-10-22 MED ORDER — HEPARIN SOD (PORK) LOCK FLUSH 100 UNIT/ML IV SOLN
INTRAVENOUS | Status: AC
  Administered 2022-10-22: 500 [IU]
  Filled 2022-10-22: qty 5

## 2022-10-22 MED ORDER — SODIUM CHLORIDE (PF) 0.9 % IJ SOLN
INTRAMUSCULAR | Status: AC
  Filled 2022-10-22: qty 50

## 2022-10-23 LAB — T4: T4, Total: 5.7 ug/dL (ref 4.5–12.0)

## 2022-10-25 ENCOUNTER — Telehealth: Payer: Self-pay | Admitting: Hematology and Oncology

## 2022-10-25 ENCOUNTER — Encounter: Payer: Self-pay | Admitting: Hematology and Oncology

## 2022-10-25 NOTE — Telephone Encounter (Signed)
Scheduled appointment per 10/26 los. Patient is aware. 

## 2022-10-28 ENCOUNTER — Inpatient Hospital Stay: Payer: 59 | Attending: Licensed Clinical Social Worker | Admitting: Hematology and Oncology

## 2022-10-28 DIAGNOSIS — Z171 Estrogen receptor negative status [ER-]: Secondary | ICD-10-CM | POA: Diagnosis not present

## 2022-10-28 DIAGNOSIS — C50411 Malignant neoplasm of upper-outer quadrant of right female breast: Secondary | ICD-10-CM | POA: Diagnosis not present

## 2022-10-28 NOTE — Progress Notes (Signed)
HEMATOLOGY-ONCOLOGY TELEPHONE VISIT PROGRESS NOTE  I connected with our patient on 10/28/22 at  9:30 AM EDT by telephone and verified that I am speaking with the correct person using two identifiers.  I discussed the limitations, risks, security and privacy concerns of performing an evaluation and management service by telephone and the availability of in person appointments.  I also discussed with the patient that there may be a patient responsible charge related to this service. The patient expressed understanding and agreed to proceed.   History of Present Illness: Follow-up to discuss results of the CT scan.  Oncology History  Malignant neoplasm of upper-outer quadrant of right breast in female, estrogen receptor negative ([place])  11/04/2021 Initial Diagnosis    Patient has been breast-feeding.  Felt a painful right breast mass and an axillary mass.  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 with a Ki-67 of 40%, 1 abnormal lymph node 5.8 cm biopsy: Grade 3 IDC ER 5%, PR 0%, HER2 negative, Ki-67 40%   11/11/2021 Cancer Staging   Staging form: Breast, AJCC 8th Edition - Clinical stage from 11/11/2021: Stage IIIA (cT3, cN1, cM0, G3, ER+, PR+, HER2-) - Signed by Nicholas Lose, MD on 02/18/2022 Stage prefix: Initial diagnosis Histologic grading system: 3 grade system   11/30/2021 - 04/29/2022 Chemotherapy   Patient is on Treatment Plan : BREAST Pembrolizumab + Carboplatin D1 + Paclitaxel D1,8,15 q21d X 4 cycles / Pembrolizumab + AC q21d x 4 cycles     11/30/2021 Genetic Testing   Negative genetic testing on the CancerNext-Expanded+RNA insight panel.  The report date is 11/30/2021  The CancerNext-Expanded gene panel offered by [facility] and includes sequencing and rearrangement analysis for the following 77 genes: AIP, ALK, APC*, ATM*, AXIN2, BAP1, BARD1, BLM, BMPR1A, BRCA1*, BRCA2*, BRIP1*, CDC73, CDH1*, CDK4, CDKN1B, CDKN2A, CHEK2*, CTNNA1, DICER1,  FANCC, FH, FLCN, GALNT12, KIF1B, LZTR1, MAX, MEN1, MET, MLH1*, MSH2*, MSH3, MSH6*, MUTYH*, NBN, NF1*, NF2, NTHL1, PALB2*, PHOX2B, PMS2*, POT1, PRKAR1A, PTCH1, PTEN*, RAD51C*, RAD51D*, RB1, RECQL, RET, SDHA, SDHAF2, SDHB, SDHC, SDHD, SMAD4, SMARCA4, SMARCB1, SMARCE1, STK11, SUFU, TMEM127, TP53*, TSC1, TSC2, VHL and XRCC2 (sequencing and deletion/duplication); EGFR, EGLN1, HOXB13, KIT, MITF, PDGFRA, POLD1, and POLE (sequencing only); EPCAM and GREM1 (deletion/duplication only). DNA and RNA analyses performed for * genes.    05/18/2022 - 08/19/2022 Chemotherapy   Patient is on Treatment Plan : BREAST Pembrolizumab (200) q21d x 27 weeks     06/18/2022 Surgery   Right lumpectomy: No residual invasive or in situ carcinoma.  0/5 lymph nodes     REVIEW OF SYSTEMS:   Constitutional: Denies fevers, chills or abnormal weight loss All other systems were reviewed with the patient and are negative. Observations/Objective:     Assessment Plan:  Malignant neoplasm of upper-outer quadrant of right breast in female, estrogen receptor negative ([place]) 11/04/2021: Patient has been breast-feeding.  Felt a painful right breast mass and an axillary mass.  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 with a Ki-67 of 40%, 1 abnormal lymph node 5.8 cm biopsy: Grade 3 IDC ER 5%, PR 0%, HER2 negative, Ki-67 40%   CT CAP 11/20/2021: Right breast cancer with axillary lymph nodes, mildly enlarged right retropectoral lymph nodes, right internal mammary lymph nodes Breast MRI 11/17/2021: Known breast cancer 2 cm, right axillary lymph node, non-mass enhancement 7 cm, borderline enlarged right internal mammary lymph nodes   Treatment Plan: based on multidisciplinary tumor board:  1. Neoadjuvant chemotherapy with Taxol weekly 12 with Keytruda and carboplatin every 3 weeks x4 Adriamycin and Cytoxan, Keytruda dose dense 4 followed by  followed by Beryle Flock maintenance for 1 year completed  08/19/2022 2. 06/18/2022:Right lumpectomy (at [facility]): No residual invasive or in situ carcinoma.  0/5 lymph nodes 3. Followed by adjuvant radiation therapy to be completed September 2023.  --------------------------------------------------------------------------------------------------------------------------- CT CAP 10/25/2022: Postoperative changes no metastatic disease Patient is happy to hear the results of the CT scan.  She is still awaiting the results of the [employer] testing. I reviewed the CT scan results with the patient. Return to clinic in for survivorship care plan visit in January.  After that I can see her in 6 months and then annually thereafter.    I discussed the assessment and treatment plan with the patient. The patient was provided an opportunity to ask questions and all were answered. The patient agreed with the plan and demonstrated an understanding of the instructions. The patient was advised to call back or seek an in-person evaluation if the symptoms worsen or if the condition fails to improve as anticipated.   I provided 12 minutes of non-face-to-face time during this encounter.  This includes time for charting and coordination of care   Harriette Ohara, MD

## 2022-10-28 NOTE — Assessment & Plan Note (Signed)
11/04/2021: Patient has been breast-feeding.  Felt a painful right breast mass and an axillary mass.  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 with a Ki-67 of 40%, 1 abnormal lymph node 5.8 cm biopsy: Grade 3 IDC ER 5%, PR 0%, HER2 negative, Ki-67 40%   CT CAP 11/20/2021: Right breast cancer with axillary lymph nodes, mildly enlarged right retropectoral lymph nodes, right internal mammary lymph nodes Breast MRI 11/17/2021: Known breast cancer 2 cm, right axillary lymph node, non-mass enhancement 7 cm, borderline enlarged right internal mammary lymph nodes   Treatment Plan: based on multidisciplinary tumor board: 1. Neoadjuvant chemotherapy with Taxol weekly 12 with Keytruda and carboplatin every 3 weeks x4 Adriamycin and Cytoxan, Keytruda dose dense 4 followed by  followed by Beryle Flock maintenance for 1 year completed 08/19/2022 2. 06/18/2022:Right lumpectomy (at [facility]): No residual invasive or in situ carcinoma.  0/5 lymph nodes 3. Followed by adjuvant radiation therapy to be completed September 2023.  --------------------------------------------------------------------------------------------------------------------------- CT CAP 10/25/2022: Postoperative changes no metastatic disease  I reviewed the CT scan results with the patient. Return to clinic in 1 year for follow-up

## 2022-11-01 DIAGNOSIS — K432 Incisional hernia without obstruction or gangrene: Secondary | ICD-10-CM | POA: Insufficient documentation

## 2022-11-11 ENCOUNTER — Inpatient Hospital Stay: Payer: 59

## 2022-11-11 ENCOUNTER — Encounter: Payer: Self-pay | Admitting: *Deleted

## 2022-11-11 ENCOUNTER — Inpatient Hospital Stay: Payer: 59 | Admitting: Hematology and Oncology

## 2022-11-12 LAB — SIGNATERA ONLY (NATERA MANAGED)
SIGNATERA MTM READOUT: 0 MTM/ml
SIGNATERA TEST RESULT: NEGATIVE

## 2022-11-24 ENCOUNTER — Telehealth: Payer: Self-pay

## 2022-11-24 NOTE — Telephone Encounter (Signed)
Called pt per MD to advise [employer] testing was negative/not detected. Pt verbalized understanding of results and knows [employer] will be in touch to schedule 3 mo repeat lab.   

## 2022-11-26 ENCOUNTER — Encounter: Payer: Self-pay | Admitting: Hematology and Oncology

## 2022-12-03 ENCOUNTER — Telehealth: Payer: Self-pay | Admitting: Adult Health

## 2022-12-03 NOTE — Telephone Encounter (Signed)
Patient called to r/s upcoming appointment. R/s appointment with patient.

## 2022-12-07 ENCOUNTER — Other Ambulatory Visit: Payer: Self-pay

## 2022-12-07 ENCOUNTER — Inpatient Hospital Stay: Payer: 59 | Attending: Licensed Clinical Social Worker

## 2022-12-07 DIAGNOSIS — Z95828 Presence of other vascular implants and grafts: Secondary | ICD-10-CM

## 2022-12-07 DIAGNOSIS — C50411 Malignant neoplasm of upper-outer quadrant of right female breast: Secondary | ICD-10-CM | POA: Insufficient documentation

## 2022-12-07 DIAGNOSIS — Z452 Encounter for adjustment and management of vascular access device: Secondary | ICD-10-CM | POA: Diagnosis not present

## 2022-12-07 MED ORDER — HEPARIN SOD (PORK) LOCK FLUSH 100 UNIT/ML IV SOLN
500.0000 [IU] | Freq: Once | INTRAVENOUS | Status: AC
  Administered 2022-12-07: 500 [IU]

## 2022-12-07 MED ORDER — SODIUM CHLORIDE 0.9% FLUSH
10.0000 mL | Freq: Once | INTRAVENOUS | Status: AC
  Administered 2022-12-07: 10 mL

## 2022-12-22 ENCOUNTER — Other Ambulatory Visit: Payer: Self-pay | Admitting: *Deleted

## 2022-12-22 DIAGNOSIS — E039 Hypothyroidism, unspecified: Secondary | ICD-10-CM

## 2022-12-22 NOTE — Progress Notes (Signed)
Received call from pt requesting thyroid labs to be drawn this week.  Pt states she is experiencing symptoms of hyperthyroidism and thinks her medication needs to be adjusted.  Appt scheduled, pt verbalized understanding of appt date and time.

## 2022-12-24 ENCOUNTER — Inpatient Hospital Stay: Payer: 59

## 2022-12-24 ENCOUNTER — Other Ambulatory Visit: Payer: Self-pay

## 2022-12-24 DIAGNOSIS — Z95828 Presence of other vascular implants and grafts: Secondary | ICD-10-CM

## 2022-12-24 DIAGNOSIS — Z171 Estrogen receptor negative status [ER-]: Secondary | ICD-10-CM

## 2022-12-24 DIAGNOSIS — C50411 Malignant neoplasm of upper-outer quadrant of right female breast: Secondary | ICD-10-CM | POA: Diagnosis not present

## 2022-12-24 LAB — CMP (CANCER CENTER ONLY)
ALT: 8 U/L (ref 0–44)
AST: 12 U/L — ABNORMAL LOW (ref 15–41)
Albumin: 4.1 g/dL (ref 3.5–5.0)
Alkaline Phosphatase: 29 U/L — ABNORMAL LOW (ref 38–126)
Anion gap: 5 (ref 5–15)
BUN: 11 mg/dL (ref 6–20)
CO2: 24 mmol/L (ref 22–32)
Calcium: 9.2 mg/dL (ref 8.9–10.3)
Chloride: 108 mmol/L (ref 98–111)
Creatinine: 0.78 mg/dL (ref 0.44–1.00)
GFR, Estimated: >60 mL/min (ref >60)
Glucose, Bld: 155 mg/dL — ABNORMAL HIGH (ref 70–99)
Potassium: 3.6 mmol/L (ref 3.5–5.1)
Sodium: 137 mmol/L (ref 135–145)
Total Bilirubin: 0.3 mg/dL (ref 0.3–1.2)
Total Protein: 6.4 g/dL — ABNORMAL LOW (ref 6.5–8.1)

## 2022-12-24 LAB — CBC WITH DIFFERENTIAL (CANCER CENTER ONLY)
Abs Immature Granulocytes: 0 10*3/uL (ref 0.00–0.07)
Basophils Absolute: 0 10*3/uL (ref 0.0–0.1)
Basophils Relative: 1 %
Eosinophils Absolute: 0 10*3/uL (ref 0.0–0.5)
Eosinophils Relative: 1 %
HCT: 28.4 % — ABNORMAL LOW (ref 36.0–46.0)
Hemoglobin: 9.4 g/dL — ABNORMAL LOW (ref 12.0–15.0)
Immature Granulocytes: 0 %
Lymphocytes Relative: 15 %
Lymphs Abs: 0.6 10*3/uL — ABNORMAL LOW (ref 0.7–4.0)
MCH: 29.6 pg (ref 26.0–34.0)
MCHC: 33.1 g/dL (ref 30.0–36.0)
MCV: 89.3 fL (ref 80.0–100.0)
Monocytes Absolute: 0.3 10*3/uL (ref 0.1–1.0)
Monocytes Relative: 6 %
Neutro Abs: 3.3 10*3/uL (ref 1.7–7.7)
Neutrophils Relative %: 77 %
Platelet Count: 193 10*3/uL (ref 150–400)
RBC: 3.18 MIL/uL — ABNORMAL LOW (ref 3.87–5.11)
RDW: 14.8 % (ref 11.5–15.5)
WBC Count: 4.2 10*3/uL (ref 4.0–10.5)
nRBC: 0 % (ref 0.0–0.2)

## 2022-12-24 MED ORDER — HEPARIN SOD (PORK) LOCK FLUSH 100 UNIT/ML IV SOLN
500.0000 [IU] | Freq: Once | INTRAVENOUS | Status: AC
  Administered 2022-12-24: 500 [IU]

## 2022-12-24 MED ORDER — SODIUM CHLORIDE 0.9% FLUSH
10.0000 mL | Freq: Once | INTRAVENOUS | Status: AC
  Administered 2022-12-24: 10 mL

## 2022-12-25 LAB — T4: T4, Total: 1 ug/dL — ABNORMAL LOW (ref 4.5–12.0)

## 2022-12-28 LAB — TSH: TSH: 107.123 u[IU]/mL — ABNORMAL HIGH (ref 0.350–4.500)

## 2022-12-29 ENCOUNTER — Ambulatory Visit: Payer: 59 | Admitting: Rehabilitation

## 2022-12-29 ENCOUNTER — Telehealth: Payer: Self-pay | Admitting: *Deleted

## 2022-12-29 ENCOUNTER — Other Ambulatory Visit: Payer: Self-pay | Admitting: *Deleted

## 2022-12-29 DIAGNOSIS — E039 Hypothyroidism, unspecified: Secondary | ICD-10-CM

## 2022-12-29 NOTE — Telephone Encounter (Signed)
Received call from pt to review TSH results. RN educated pt on MD recommendations.  Pt states she is experiencing situational anxiety and is having trouble sleeping at night.  Per MD pt needing to try OTC Melatonin '1mg'$  to 3 mg p.o daily 1 hour before bedtime.  Pt educated and verbalized understanding.

## 2022-12-29 NOTE — Progress Notes (Signed)
RN reviewed TSH results with MD showing TSH 107.123.  Pt states she had stopped her Levothyroxine 150 mcg because she had felt that she was on too high of a dose.  Per MD pt needing to resume Levothyroxine at 150 mcg p.o daily and recheck labs in 6 weeks and refer pt to endocrinology.  RN attempt x1 to contact pt.  No answer, unable to LVM due to VM being full.

## 2022-12-30 NOTE — Therapy (Signed)
OUTPATIENT PHYSICAL THERAPY TREATMENT NOTE   Patient Name: Sydney Rios MRN: [id number] DOB:1981-12-04, 42 y.o., female Today's Date: 12/31/2022  PCP:  Horald Pollen, MD REFERRING PROVIDER: Nicholas Lose, MD  END OF SESSION:   PT End of Session - 12/31/22 1108     Visit Number 9    Number of Visits 21    Date for PT Re-Evaluation 02/11/23    PT Start Time 1105    PT Stop Time 1150    PT Time Calculation (min) 45 min    Activity Tolerance Patient tolerated treatment well    Behavior During Therapy WFL for tasks assessed/performed              Past Medical History:  Diagnosis Date   Anxiety    Cancer ([place])    Right Breast CA   Chronic kidney disease    hx kidney stones   Complication of anesthesia    runs heart rate in the 30-40s in PACU   History of kidney stones    Hx gestational diabetes    Hx of Hashimoto thyroiditis    PONV (postoperative nausea and vomiting)    Past Surgical History:  Procedure Laterality Date   CESAREAN SECTION     CESAREAN SECTION  01/23/2013   Procedure: CESAREAN SECTION;  Surgeon: Luz Lex, MD;  Location: [facility];  Service: Obstetrics;  Laterality: N/A;   CESAREAN SECTION N/A 10/11/2014   Procedure: CESAREAN SECTION;  Surgeon: Cyril Mourning, MD;  Location: [facility];  Service: Obstetrics;  Laterality: N/A;   CESAREAN SECTION N/A 10/24/2018   Procedure: REPEAT CESAREAN SECTION;  Surgeon: Dian Queen, MD;  Location: [facility] [facility];  Service: Obstetrics;  Laterality: N/A;  Tracey RNFA   LITHOTRIPSY     PORTACATH PLACEMENT Left 11/25/2021   Procedure: PORT PLACEMENT;  Surgeon: Rolm Bookbinder, MD;  Location: [place];  Service: General;  Laterality: Left;   WISDOM TOOTH EXTRACTION     Patient Active Problem List   Diagnosis Date Noted   Genetic testing 12/01/2021   Port-A-Cath in place 11/30/2021   Malignant neoplasm of upper-outer quadrant of right breast in female, estrogen receptor negative ([place]) 11/11/2021    Abnormal maternal glucose tolerance, antepartum 09/15/2018   S/P cesarean section 10/11/2014   TACHYCARDIA 05/01/2010    REFERRING DIAG: C50.411,Z17.1 (ICD-10-CM) - Malignant neoplasm of upper-outer quadrant of right breast in female, estrogen receptor negative ([place])  THERAPY DIAG:  Malignant neoplasm of upper-inner quadrant of right breast in female, estrogen receptor negative [facility])  Aftercare following surgery for neoplasm  Rationale for Evaluation and Treatment Rehabilitation  PERTINENT HISTORY: Patient was diagnosed on 11/05/2021 with right grade III invasive ductal carcinoma breast cancer. She has a large axillary lymph node that was biopsied and positive for disease. It is triple negative with a Ki67 of 40%. Completed chemo 11/30/21-04/29/22. 06/18/22- R lumpectomy 0/5 lymph nodes  PRECAUTIONS:  Other: at risk for lymphedema on R side  SUBJECTIVE:  Having some pain in the right axillary region and medial upper arm.I got some compression bras before christmas and they help the breast swelling and it also helps the axillary pain. I have some neck and shoulder pain with sleeping and some scapular pain with sleeping.  When the axilla hurts it is with reaching too far. I haven't been doing the MLD or exercises lately and I need some new handouts.  PAIN:  Are you having pain? Yes PAIN:  Are you having pain? Yes NPRS scale: 1-4/10 Pain  location: axilla Pain orientation: Right  PAIN TYPE: aching and tight Pain description: intermittent  Aggravating factors: reaching to extremes, sleeping, Relieving factors: not reaching, drinking water    OBJECTIVE: (objective measures completed at initial evaluation unless otherwise dated)   PALPATION: cording visible and palpable in right axillary region and upper arm   OBSERVATIONS / OTHER ASSESSMENTS:  EVAL:some swelling present on inferior breast as evidenced by increased pore size, RE-eval No significant swelling noted;no increased pore size  today   POSTURE: forward head, rounded shoulders   UPPER EXTREMITY AROM/PROM:   A/PROM RIGHT   eval   08/03/22 08/10/2022 09/08/22 10/05/22 12/31/2022  Shoulder extension 62  67  54 pull 56  Shoulder flexion 122 167 - axilla pull 168 mild pull 165 mild pull 139 pull in arm 152  Shoulder abduction 96 175 - no pull  180 182 175 173  Shoulder internal rotation 63    WNLs 64  Shoulder external rotation 88  110  WNLs 106                          (Blank rows = not tested)   A/PROM LEFT   eval 12/31/2022  Shoulder extension 68 62  Shoulder flexion 160 160  Shoulder abduction 175 180  Shoulder internal rotation 63 73  Shoulder external rotation 87 100                          (Blank rows = not tested)       LYMPHEDEMA ASSESSMENTS:   SURGERY TYPE/DATE: R lumpectomy and SLNB 06/18/22  NUMBER OF LYMPH NODES REMOVED: 5 - all negative  CHEMOTHERAPY: completed and currently on Keytruda  RADIATION:will need in the future  HORMONE TREATMENT: pt does not require  INFECTIONS: none   LYMPHEDEMA ASSESSMENTS:    LANDMARK RIGHT  eval  10 cm proximal to olecranon process 26.5  Olecranon process 24.5  10 cm proximal to ulnar styloid process 19.1  Just proximal to ulnar styloid process 14.7  Across hand at thumb web space 18.6  At base of 2nd digit 5.6  (Blank rows = not tested)   LANDMARK LEFT  eval  10 cm proximal to olecranon process 26.2  Olecranon process 25  10 cm proximal to ulnar styloid process 19.3  Just proximal to ulnar styloid process 14.6  Across hand at thumb web space 18.5  At base of 2nd digit 5.5  (Blank rows = not tested)     QUICK DASH SURVEY: 16% eval,    12/31/2021 16%              TODAY'S TREATMENT  Re-eval Short period of MFR to right UE cording with 1 small pop noted. Had pt perform supine wand flexion x 4 and scaption x3 to assist with stretching cord Wall slide x1 Performed SOZO screen Updated HEP and gave new handout for MLD to right  breast   10/05/22: Manual Therapy P/ROM in supine to Rt shoulder into flexion, abd and D2 to pts end available end ROM with scapular depression applied throughout due to muscle tightness  Soft tissue mobs performed in Rt axilla and into upper arm where tightness palpated that is now limiting her end A/ROM STM in Rt S/L to medial scapular border with trigger point release Remeasured pts A/ROM of Rt shoulder, see chart  09/08/22: Manual Therapy MLD: In Supine short neck, 5 diaphragmatic breaths, Lt axillary and Rt  inguinal nodes, anterior inter-axillary and Rt axillo-inguinal anastomosis, then focused on Rt medial, with pt permission and appropriate draping throughout. P/ROM in supine to Rt shoulder into flexion and abd to pts end available end ROM with scapular depression applied throughout due to muscle tightness and blocking in axilla above radiated area to prevent pull on radiated tissue as her skin is peeling in axilla Soft tissue mobs performed in Rt axilla above radiated area to scar tissue  08/23/22: MLD: In Supine short neck, 5 diaphragmatic breaths, Lt axillary and Rt inguinal nodes, anterior inter-axillary and Rt axillo-inguinal anastomosis, then focused on Rt medial, then into Lt S/L for lateral breast redirecting towards Rt axillo-inguinal anastomosis with pt permission and appropriate draping throughout. P/ROM in supine to Rt shoulder into flexion, abd and D3 to pts end available end ROM with scapular depression applied throughout due to muscle tightness Soft tissue mobs performed in Lt S/L to right UT and levator with cocoa butter where increased muscle tightness palpated,then removed with alcohol wipes prior to going to radiation     PATIENT EDUCATION: Access Code: [email or web address] URL: [email or web address] Date: 12/31/2022 Prepared by: Cheral Almas  Exercises - Supine Shoulder Flexion with Dowel  - 2 x daily - 7 x weekly - 1 sets - 5 reps - 5-10 hold - Standing  Shoulder Abduction Slides at Wall  - 2 x daily - 7 x weekly - 1 sets - 10 reps - 5-10 hold  Education details: post op exercises, axillary cording, truncal stretch Person educated: Patient Education method: Explanation, Demonstration, Verbal cues, and Handouts Education comprehension: verbalized understanding     HOME EXERCISE PROGRAM: Post op exercises  Access Code: [email or web address] URL: [email or web address] Date: 08/17/2022 Prepared by: Manus Gunning  Exercises - Supine Shoulder External Rotation Stretch  - 1 x daily - 7 x weekly - 1 sets - 5-10 reps - 15-30 seconds hold - Supine Lower Trunk Rotation  - 1 x daily - 7 x weekly - 1 sets - 5-10 reps - 15-30 sec  hold - Seated Upper Trap Stretch  - 1 x daily - 7 x weekly - 1 sets - 5-10 reps - 15-30 hold - Seated Levator Scapulae Stretch (Mirrored)  - 1 x daily - 7 x weekly - 1 sets - 5-10 reps - 15-30 hold  ASSESSMENT:   CLINICAL IMPRESSION:  Pt returns to physical therapy after  a hiatus of several months. She notes improvements in breast swelling with wearing her compression bra. She is noticing axillary pain/tightness at end ranges of motion and there has been a return of multiple small cords limiting her ROM for flexion and abduction slightly. She also complains of intermittent Right UT pain and shoulder pain with some difficulty sleeping. She would like to work on her ROM and return in 2 weeks. She will benefit from skilled PT for MFR to area of cording, review of breast MLD prn, and soft tissue mobilization/stretches for UT/scapular region prn  OBJECTIVE IMPAIRMENTS decreased ROM, increased edema, increased fascial restrictions, increased muscle spasms, impaired UE functional use, and postural dysfunction.    ACTIVITY LIMITATIONS lifting and reach over head   PARTICIPATION LIMITATIONS:  none   PERSONAL FACTORS  none  are also affecting patient's functional outcome.    REHAB POTENTIAL: Excellent   CLINICAL  DECISION MAKING: Stable/uncomplicated   EVALUATION COMPLEXITY: Low   GOALS: Goals reviewed with patient? Yes   SHORT TERM GOALS=LONG TERM GOALS  Target date: 11/02/2022     Pt will demonstrate 160  degrees of R shoulder flexion to allow her to reach overhead.  Baseline: 122; 165 degrees - 09/08/22; 139 degrees - 10/05/22, 12/31/2022  152 Goal status: ONGOING   2.  Pt will demonstrate 165 degrees of L shoulder abduction to allow her to reach up and out to the side.  Baseline: 96; 182 degrees - 09/08/22; 175 degrees - 10/05/22 Goal status: MET   3.  Pt will report she is no longer having rib pain to allow her improved comfort.  Baseline:  Goal status: MET   4.  Pt will not demonstrate any increase in pore size in inferior breast to decrease risk of cellulitis.  Baseline: Pore size still present as pt has had increased lymphedema present due to currently undergoing radiation - 09/08/22; pore size much improved - 10/05/22 Goal status: MET   5.  Pt will be independent in a home exercise program for continued stretching and strengthening.  Baseline: Pt is independent with current HEP - 09/08/22, updated handouts 12/31/2022 Goal status: ONGOING 6. Pt will have decreased cording in Right UE with improved shoulder ROM  Baseline: multiple cords axilla and arm   NEW   PLAN: PT FREQUENCY: 1-2 x/week prn   PT DURATION: 6 weeks   PLANNED INTERVENTIONS: Therapeutic exercises, Therapeutic activity, Patient/Family education, Self Care, Orthotic/Fit training, Manual lymph drainage, Compression bandaging, scar mobilization, Taping, Vasopneumatic device, and Manual therapy, dry needling   PLAN FOR NEXT SESSION:. Renewal done this visit. Progress towards goal of improving her Rt shoulder end ROM and manual techniques to support this, Soft tissue mobilization prn to UT/scapula region    [employer], PT 12/31/2022, 12:26 PM

## 2022-12-31 ENCOUNTER — Ambulatory Visit: Payer: 59 | Attending: Hematology and Oncology

## 2022-12-31 DIAGNOSIS — C50211 Malignant neoplasm of upper-inner quadrant of right female breast: Secondary | ICD-10-CM | POA: Insufficient documentation

## 2022-12-31 DIAGNOSIS — Z483 Aftercare following surgery for neoplasm: Secondary | ICD-10-CM | POA: Diagnosis present

## 2022-12-31 DIAGNOSIS — Z171 Estrogen receptor negative status [ER-]: Secondary | ICD-10-CM | POA: Insufficient documentation

## 2022-12-31 NOTE — Patient Instructions (Signed)
Access Code: [email or web address] URL: [email or web address] Date: 12/31/2022 Prepared by: Cheral Almas  Exercises - Supine Shoulder Flexion with Dowel  - 2 x daily - 7 x weekly - 1 sets - 5 reps - 5-10 hold - Standing Shoulder Abduction Slides at Wall  - 2 x daily - 7 x weekly - 1 sets - 10 reps - 5-10 hold

## 2023-01-04 ENCOUNTER — Telehealth: Payer: Self-pay | Admitting: Hematology and Oncology

## 2023-01-04 NOTE — Telephone Encounter (Signed)
Scheduled appointment per 1/4 staff message. Unable to leave a voicemail due to the mailbox being full.

## 2023-01-13 ENCOUNTER — Ambulatory Visit: Payer: 59

## 2023-01-18 ENCOUNTER — Ambulatory Visit: Payer: 59

## 2023-01-18 DIAGNOSIS — C50211 Malignant neoplasm of upper-inner quadrant of right female breast: Secondary | ICD-10-CM

## 2023-01-18 DIAGNOSIS — Z483 Aftercare following surgery for neoplasm: Secondary | ICD-10-CM

## 2023-01-18 NOTE — Therapy (Signed)
OUTPATIENT PHYSICAL THERAPY TREATMENT NOTE   Patient Name: Sydney Rios MRN: [id number] DOB:[birth year], 42 y.o., female Today's Date: 01/18/2023  PCP:  Horald Pollen, MD REFERRING PROVIDER: Nicholas Lose, MD  END OF SESSION:   PT End of Session - 01/18/23 1012     Visit Number 10    Number of Visits 21    Date for PT Re-Evaluation 02/11/23    PT Start Time 1007    PT Stop Time 1056    PT Time Calculation (min) 49 min    Activity Tolerance Patient tolerated treatment well    Behavior During Therapy WFL for tasks assessed/performed              Past Medical History:  Diagnosis Date   Anxiety    Cancer ([place])    Right Breast CA   Chronic kidney disease    hx kidney stones   Complication of anesthesia    runs heart rate in the 30-40s in PACU   History of kidney stones    Hx gestational diabetes    Hx of Hashimoto thyroiditis    PONV (postoperative nausea and vomiting)    Past Surgical History:  Procedure Laterality Date   CESAREAN SECTION     CESAREAN SECTION  01/23/2013   Procedure: CESAREAN SECTION;  Surgeon: Luz Lex, MD;  Location: [facility];  Service: Obstetrics;  Laterality: N/A;   CESAREAN SECTION N/A 10/11/2014   Procedure: CESAREAN SECTION;  Surgeon: Cyril Mourning, MD;  Location: [facility];  Service: Obstetrics;  Laterality: N/A;   CESAREAN SECTION N/A 10/24/2018   Procedure: REPEAT CESAREAN SECTION;  Surgeon: Dian Queen, MD;  Location: [place];  Service: Obstetrics;  Laterality: N/A;  Tracey RNFA   LITHOTRIPSY     PORTACATH PLACEMENT Left 11/25/2021   Procedure: PORT PLACEMENT;  Surgeon: Rolm Bookbinder, MD;  Location: [place] [facility];  Service: General;  Laterality: Left;   WISDOM TOOTH EXTRACTION     Patient Active Problem List   Diagnosis Date Noted   Genetic testing 12/01/2021   Port-A-Cath in place 11/30/2021   Malignant neoplasm of upper-outer quadrant of right breast in female, estrogen receptor negative ([place]) 11/11/2021    Abnormal maternal glucose tolerance, antepartum 09/15/2018   S/P cesarean section 10/11/2014   TACHYCARDIA 05/01/2010    REFERRING DIAG: C50.411,Z17.1 (ICD-10-CM) - Malignant neoplasm of upper-outer quadrant of right breast in female, estrogen receptor negative ([place])  THERAPY DIAG:  Malignant neoplasm of upper-inner quadrant of right breast in female, estrogen receptor negative [facility])  Aftercare following surgery for neoplasm  Rationale for Evaluation and Treatment Rehabilitation  PERTINENT HISTORY: Patient was diagnosed on 11/05/2021 with right grade III invasive ductal carcinoma breast cancer. She has a large axillary lymph node that was biopsied and positive for disease. It is triple negative with a Ki67 of 40%. Completed chemo 11/30/21-04/29/22. 06/18/22- R lumpectomy 0/5 lymph nodes  PRECAUTIONS:  Other: at risk for lymphedema on R side  SUBJECTIVE:  I can tell the Rt lateral trunk feels a but fuller than the Lt side.   PAIN:  Are you having pain? No PAIN:  Are you having pain? No, just normally when I first wake up to 2/10 that loosens up once I start moving    OBJECTIVE: (objective measures completed at initial evaluation unless otherwise dated)   PALPATION: cording visible and palpable in right axillary region and upper arm   OBSERVATIONS / OTHER ASSESSMENTS:  EVAL:some swelling present on inferior breast as evidenced by increased  pore size, RE-eval No significant swelling noted;no increased pore size today   POSTURE: forward head, rounded shoulders   UPPER EXTREMITY AROM/PROM:   A/PROM RIGHT   eval   08/03/22 08/10/2022 09/08/22 10/05/22 12/31/2022  Shoulder extension 62  67  54 pull 56  Shoulder flexion 122 167 - axilla pull 168 mild pull 165 mild pull 139 pull in arm 152  Shoulder abduction 96 175 - no pull  180 182 175 173  Shoulder internal rotation 63    WNLs 64  Shoulder external rotation 88  110  WNLs 106                          (Blank rows = not tested)    A/PROM LEFT   eval 12/31/2022  Shoulder extension 68 62  Shoulder flexion 160 160  Shoulder abduction 175 180  Shoulder internal rotation 63 73  Shoulder external rotation 87 100                          (Blank rows = not tested)       LYMPHEDEMA ASSESSMENTS:   SURGERY TYPE/DATE: R lumpectomy and SLNB 06/18/22  NUMBER OF LYMPH NODES REMOVED: 5 - all negative  CHEMOTHERAPY: completed and currently on Keytruda  RADIATION:will need in the future  HORMONE TREATMENT: pt does not require  INFECTIONS: none   LYMPHEDEMA ASSESSMENTS:    LANDMARK RIGHT  eval  10 cm proximal to olecranon process 26.5  Olecranon process 24.5  10 cm proximal to ulnar styloid process 19.1  Just proximal to ulnar styloid process 14.7  Across hand at thumb web space 18.6  At base of 2nd digit 5.6  (Blank rows = not tested)   LANDMARK LEFT  eval  10 cm proximal to olecranon process 26.2  Olecranon process 25  10 cm proximal to ulnar styloid process 19.3  Just proximal to ulnar styloid process 14.6  Across hand at thumb web space 18.5  At base of 2nd digit 5.5  (Blank rows = not tested)     QUICK DASH SURVEY: 16% eval,    12/31/2021 16%              TODAY'S TREATMENT  01/18/23: Manual Therapy P/ROM in supine to Rt shoulder into flexion, abd and D2 to pts end available end ROM with scapular depression applied throughout  MFR to Rt axilla and lateral trunk where tightness palpable but could not palpate a cord today MLD: In Supine: Short neck, superficial and deep abdominals, Rt inguinal nodes, Rt axillo-inguinal anastomosis, then Rt UE working on Rt upper arm, laterally, then medial to lateral and lateral again performing this throughout manual therapy today Re-eval 12/31/22 Short period of MFR to right UE cording with 1 small pop noted. Had pt perform supine wand flexion x 4 and scaption x3 to assist with stretching cord Wall slide x1 Performed SOZO screen Updated HEP and gave new handout for MLD  to right breast   10/05/22: Manual Therapy P/ROM in supine to Rt shoulder into flexion, abd and D2 to pts end available end ROM with scapular depression applied throughout due to muscle tightness  Soft tissue mobs performed in Rt axilla and into upper arm where tightness palpated that is now limiting her end A/ROM STM in Rt S/L to medial scapular border with trigger point release Remeasured pts A/ROM of Rt shoulder, see chart  09/08/22: Manual Therapy MLD: In  Supine short neck, 5 diaphragmatic breaths, Lt axillary and Rt inguinal nodes, anterior inter-axillary and Rt axillo-inguinal anastomosis, then focused on Rt medial, with pt permission and appropriate draping throughout. P/ROM in supine to Rt shoulder into flexion and abd to pts end available end ROM with scapular depression applied throughout due to muscle tightness and blocking in axilla above radiated area to prevent pull on radiated tissue as her skin is peeling in axilla Soft tissue mobs performed in Rt axilla above radiated area to scar tissue      PATIENT EDUCATION: Access Code: [email or web address] URL: [email or web address] Date: 12/31/2022 Prepared by: Cheral Almas  Exercises - Supine Shoulder Flexion with Dowel  - 2 x daily - 7 x weekly - 1 sets - 5 reps - 5-10 hold - Standing Shoulder Abduction Slides at Wall  - 2 x daily - 7 x weekly - 1 sets - 10 reps - 5-10 hold  Education details: post op exercises, axillary cording, truncal stretch Person educated: Patient Education method: Explanation, Demonstration, Verbal cues, and Handouts Education comprehension: verbalized understanding     HOME EXERCISE PROGRAM: Post op exercises  Access Code: [email or web address] URL: [email or web address] Date: 08/17/2022 Prepared by: Manus Gunning  Exercises - Supine Shoulder External Rotation Stretch  - 1 x daily - 7 x weekly - 1 sets - 5-10 reps - 15-30 seconds hold - Supine Lower Trunk Rotation  - 1 x daily - 7  x weekly - 1 sets - 5-10 reps - 15-30 sec  hold - Seated Upper Trap Stretch  - 1 x daily - 7 x weekly - 1 sets - 5-10 reps - 15-30 hold - Seated Levator Scapulae Stretch (Mirrored)  - 1 x daily - 7 x weekly - 1 sets - 5-10 reps - 15-30 hold  ASSESSMENT:   CLINICAL IMPRESSION:  Resumed manual therapy to Rt upper quadrant including P/ROM, MFR and MLD. Tightness was palpable that softened some by end of session at posterior axilla where pt reports pain in morning and night sometimes.   OBJECTIVE IMPAIRMENTS decreased ROM, increased edema, increased fascial restrictions, increased muscle spasms, impaired UE functional use, and postural dysfunction.    ACTIVITY LIMITATIONS lifting and reach over head   PARTICIPATION LIMITATIONS:  none   PERSONAL FACTORS  none  are also affecting patient's functional outcome.    REHAB POTENTIAL: Excellent   CLINICAL DECISION MAKING: Stable/uncomplicated   EVALUATION COMPLEXITY: Low   GOALS: Goals reviewed with patient? Yes   SHORT TERM GOALS=LONG TERM GOALS  Target date: 11/02/2022     Pt will demonstrate 160 degrees of R shoulder flexion to allow her to reach overhead.  Baseline: 122; 165 degrees - 09/08/22; 139 degrees - 10/05/22, 12/31/2022  152 Goal status: ONGOING   2.  Pt will demonstrate 165 degrees of L shoulder abduction to allow her to reach up and out to the side.  Baseline: 96; 182 degrees - 09/08/22; 175 degrees - 10/05/22 Goal status: MET   3.  Pt will report she is no longer having rib pain to allow her improved comfort.  Baseline:  Goal status: MET   4.  Pt will not demonstrate any increase in pore size in inferior breast to decrease risk of cellulitis.  Baseline: Pore size still present as pt has had increased lymphedema present due to currently undergoing radiation - 09/08/22; pore size much improved - 10/05/22 Goal status: MET   5.  Pt will be independent in a home exercise program for continued stretching and strengthening.  Baseline: Pt is independent with current HEP - 09/08/22, updated handouts 12/31/2022 Goal status: ONGOING 6. Pt will have decreased cording in Right UE with improved shoulder ROM  Baseline: multiple cords axilla and arm   NEW   PLAN: PT FREQUENCY: 1-2 x/week prn   PT DURATION: 6 weeks   PLANNED INTERVENTIONS: Therapeutic exercises, Therapeutic activity, Patient/Family education, Self Care, Orthotic/Fit training, Manual lymph drainage, Compression bandaging, scar mobilization, Taping, Vasopneumatic device, and Manual therapy, dry needling   PLAN FOR NEXT SESSION:. Progress towards goal of improving her Rt shoulder end ROM and manual techniques to support this, Soft tissue mobilization prn to UT/scapula region    Otelia Limes, PTA 01/18/2023, 11:05 AM

## 2023-01-24 ENCOUNTER — Inpatient Hospital Stay: Payer: 59

## 2023-01-24 ENCOUNTER — Encounter: Payer: Self-pay | Admitting: Adult Health

## 2023-01-24 ENCOUNTER — Other Ambulatory Visit: Payer: Self-pay

## 2023-01-24 ENCOUNTER — Other Ambulatory Visit: Payer: Self-pay | Admitting: *Deleted

## 2023-01-24 ENCOUNTER — Inpatient Hospital Stay: Payer: 59 | Attending: Licensed Clinical Social Worker | Admitting: Adult Health

## 2023-01-24 ENCOUNTER — Telehealth: Payer: Self-pay

## 2023-01-24 VITALS — BP 104/47 | HR 65 | Temp 98.5°F | Resp 18 | Ht 67.0 in | Wt 145.2 lb

## 2023-01-24 DIAGNOSIS — F419 Anxiety disorder, unspecified: Secondary | ICD-10-CM | POA: Insufficient documentation

## 2023-01-24 DIAGNOSIS — C50411 Malignant neoplasm of upper-outer quadrant of right female breast: Secondary | ICD-10-CM | POA: Insufficient documentation

## 2023-01-24 DIAGNOSIS — Z452 Encounter for adjustment and management of vascular access device: Secondary | ICD-10-CM | POA: Diagnosis not present

## 2023-01-24 DIAGNOSIS — Z95828 Presence of other vascular implants and grafts: Secondary | ICD-10-CM

## 2023-01-24 DIAGNOSIS — Z171 Estrogen receptor negative status [ER-]: Secondary | ICD-10-CM

## 2023-01-24 DIAGNOSIS — Z170 Estrogen receptor positive status [ER+]: Secondary | ICD-10-CM | POA: Insufficient documentation

## 2023-01-24 DIAGNOSIS — Z79890 Hormone replacement therapy (postmenopausal): Secondary | ICD-10-CM | POA: Diagnosis not present

## 2023-01-24 DIAGNOSIS — F32A Depression, unspecified: Secondary | ICD-10-CM | POA: Insufficient documentation

## 2023-01-24 LAB — CBC WITH DIFFERENTIAL (CANCER CENTER ONLY)
Abs Immature Granulocytes: 0.01 10*3/uL (ref 0.00–0.07)
Basophils Absolute: 0 10*3/uL (ref 0.0–0.1)
Basophils Relative: 1 %
Eosinophils Absolute: 0 10*3/uL (ref 0.0–0.5)
Eosinophils Relative: 1 %
HCT: 30.3 % — ABNORMAL LOW (ref 36.0–46.0)
Hemoglobin: 10.1 g/dL — ABNORMAL LOW (ref 12.0–15.0)
Immature Granulocytes: 0 %
Lymphocytes Relative: 19 %
Lymphs Abs: 0.5 10*3/uL — ABNORMAL LOW (ref 0.7–4.0)
MCH: 29.3 pg (ref 26.0–34.0)
MCHC: 33.3 g/dL (ref 30.0–36.0)
MCV: 87.8 fL (ref 80.0–100.0)
Monocytes Absolute: 0.2 10*3/uL (ref 0.1–1.0)
Monocytes Relative: 8 %
Neutro Abs: 2 10*3/uL (ref 1.7–7.7)
Neutrophils Relative %: 71 %
Platelet Count: 194 10*3/uL (ref 150–400)
RBC: 3.45 MIL/uL — ABNORMAL LOW (ref 3.87–5.11)
RDW: 16 % — ABNORMAL HIGH (ref 11.5–15.5)
WBC Count: 2.7 10*3/uL — ABNORMAL LOW (ref 4.0–10.5)
nRBC: 0 % (ref 0.0–0.2)

## 2023-01-24 LAB — TSH: TSH: 9.812 u[IU]/mL — ABNORMAL HIGH (ref 0.350–4.500)

## 2023-01-24 LAB — CMP (CANCER CENTER ONLY)
ALT: 8 U/L (ref 0–44)
AST: 12 U/L — ABNORMAL LOW (ref 15–41)
Albumin: 4.2 g/dL (ref 3.5–5.0)
Alkaline Phosphatase: 37 U/L — ABNORMAL LOW (ref 38–126)
Anion gap: 4 — ABNORMAL LOW (ref 5–15)
BUN: 10 mg/dL (ref 6–20)
CO2: 27 mmol/L (ref 22–32)
Calcium: 9.8 mg/dL (ref 8.9–10.3)
Chloride: 109 mmol/L (ref 98–111)
Creatinine: 0.71 mg/dL (ref 0.44–1.00)
GFR, Estimated: >60 mL/min (ref >60)
Glucose, Bld: 91 mg/dL (ref 70–99)
Potassium: 4.1 mmol/L (ref 3.5–5.1)
Sodium: 140 mmol/L (ref 135–145)
Total Bilirubin: 0.4 mg/dL (ref 0.3–1.2)
Total Protein: 6.8 g/dL (ref 6.5–8.1)

## 2023-01-24 MED ORDER — HEPARIN SOD (PORK) LOCK FLUSH 100 UNIT/ML IV SOLN
500.0000 [IU] | Freq: Once | INTRAVENOUS | Status: AC
  Administered 2023-01-24: 500 [IU]

## 2023-01-24 MED ORDER — LEVOTHYROXINE SODIUM 150 MCG PO TABS: ORAL_TABLET | ORAL | 1 refills | Status: DC

## 2023-01-24 MED ORDER — SODIUM CHLORIDE 0.9% FLUSH
10.0000 mL | Freq: Once | INTRAVENOUS | Status: AC
  Administered 2023-01-24: 10 mL

## 2023-01-24 NOTE — Telephone Encounter (Signed)
Called Pt regarding management of thyroid labs/medications. Per MD and NP, Pt is advised to follow up with PCP for thyroid management. Pt verbalized understanding and stated she would give [facility] a call.  This RN reached out to PCP to verify their ability to take over thyroid care. Left message for medical assistant and gave call back number.

## 2023-01-24 NOTE — Patient Instructions (Signed)
Venlafaxine Tablets What is this medication? VENLAFAXINE (VEN la fax een) treats depression and anxiety. It increases the amount of serotonin and norepinephrine in the brain, hormones that help regulate mood. It belongs to a group of medications called SNRIs. This medicine may be used for other purposes; ask your health care provider or pharmacist if you have questions. COMMON BRAND NAME(S): Effexor What should I tell my care team before I take this medication? They need to know if you have any of these conditions: Bleeding disorders Glaucoma Heart disease High blood pressure High cholesterol Kidney disease Liver disease Low levels of sodium in the blood Mania or bipolar disorder Seizures Suicidal thoughts, plans, or attempt; a previous suicide attempt by you or a family member Take medications that treat or prevent blood clots Thyroid disease An unusual or allergic reaction to venlafaxine, desvenlafaxine, other medications, foods, dyes, or preservatives Pregnant or trying to get pregnant Breast-feeding How should I use this medication? Take this medication by mouth with a glass of water. Follow the directions on the prescription label. Take it with food. Take your medication at regular intervals. Do not take your medication more often than directed. Do not stop taking this medication suddenly except upon the advice of your care team. Stopping this medication too quickly may cause serious side effects or your condition may worsen. A special MedGuide will be given to you by the pharmacist with each prescription and refill. Be sure to read this information carefully each time. Talk to your care team regarding the use of this medication in children. Special care may be needed. Overdosage: If you think you have taken too much of this medicine contact a poison control center or emergency room at once. NOTE: This medicine is only for you. Do not share this medicine with others. What if I miss  a dose? If you miss a dose, take it as soon as you can. If it is almost time for your next dose, take only that dose. Do not take double or extra doses. What may interact with this medication? Do not take this medication with any of the following: Certain medications for fungal infections like fluconazole, itraconazole, ketoconazole, posaconazole, voriconazole Cisapride Desvenlafaxine Dronedarone Duloxetine Levomilnacipran Linezolid MAOIs like Carbex, Eldepryl, Marplan, Nardil, and Parnate Methylene blue (injected into a vein) Milnacipran Pimozide Thioridazine This medication may also interact with the following: Amphetamines Aspirin and aspirin-like medications Certain medications for depression, anxiety, or psychotic disturbances Certain medications for migraine headaches like almotriptan, eletriptan, frovatriptan, naratriptan, rizatriptan, sumatriptan, zolmitriptan Certain medications for sleep Certain medications that treat or prevent blood clots like dalteparin, enoxaparin, warfarin Cimetidine Clozapine Diuretics Fentanyl Furazolidone Indinavir Isoniazid Lithium Metoprolol NSAIDS, medications for pain and inflammation, like ibuprofen or naproxen Other medications that prolong the QT interval (cause an abnormal heart rhythm) like dofetilide, ziprasidone Procarbazine Rasagiline Supplements like St. John's wort, kava kava, valerian Tramadol Tryptophan This list may not describe all possible interactions. Give your health care provider a list of all the medicines, herbs, non-prescription drugs, or dietary supplements you use. Also tell them if you smoke, drink alcohol, or use illegal drugs. Some items may interact with your medicine. What should I watch for while using this medication? Tell your care team if your symptoms do not get better or if they get worse. Visit your care team for regular checks on your progress. Because it may take several weeks to see the full effects  of this medication, it is important to continue your treatment as prescribed by  your care team. Watch for new or worsening thoughts of suicide or depression. This includes sudden changes in mood, behaviors, or thoughts. These changes can happen at any time but are more common in the beginning of treatment or after a change in dose. Call your care team right away if you experience these thoughts or worsening depression. Manic episodes may happen in patients with bipolar disorder who take this medication. Watch for changes in feelings or behaviors such as feeling anxious, nervous, agitated, panicky, irritable, hostile, aggressive, impulsive, severely restless, overly excited and hyperactive, or trouble sleeping. These changes can happen at any time but are more common in the beginning of treatment or after a change in dose. Call your care team right away if you notice any of these symptoms. This medication can cause an increase in blood pressure. Check with your care team for instructions on monitoring your blood pressure while taking this medication. You may get drowsy or dizzy. Do not drive, use machinery, or do anything that needs mental alertness until you know how this medication affects you. Do not stand or sit up quickly, especially if you are an older patient. This reduces the risk of dizzy or fainting spells. Alcohol may interfere with the effect of this medication. Avoid alcoholic drinks. Your mouth may get dry. Chewing sugarless gum, sucking hard candy and drinking plenty of water will help. Contact your care team, if the problem does not go away or is severe. What side effects may I notice from receiving this medication? Side effects that you should report to your care team as soon as possible: Allergic reactions--skin rash, itching, hives, swelling of the face, lips, tongue, or throat Bleeding--bloody or black, tar-like stools, red or dark brown urine, vomiting blood or brown material that looks  like coffee grounds, small, red or purple spots on skin, unusual bleeding or bruising Heart rhythm changes--fast or irregular heartbeat, dizziness, feeling faint or lightheaded, chest pain, trouble breathing Increase in blood pressure Loss of appetite with weight loss Low sodium level--muscle weakness, fatigue, dizziness, headache, confusion Serotonin syndrome--irritability, confusion, fast or irregular heartbeat, muscle stiffness, twitching muscles, sweating, high fever, seizures, chills, vomiting, diarrhea Sudden eye pain or change in vision such as blurry vision, seeing halos around lights, vision loss Thoughts of suicide or self-harm, worsening mood, feelings of depression Side effects that usually do not require medical attention (report to your care team if they continue or are bothersome): Anxiety, nervousness Change in sex drive or performance Dizziness Dry mouth Excessive sweating Nausea Tremors or shaking Trouble sleeping This list may not describe all possible side effects. Call your doctor for medical advice about side effects. You may report side effects to FDA at 1-800-FDA-1088. Where should I keep my medication? Keep out of the reach of children and pets. Store at a controlled temperature between 20 and 25 degrees C (68 and 77 degrees F), in a dry place. Throw away any unused medication after the expiration date. NOTE: This sheet is a summary. It may not cover all possible information. If you have questions about this medicine, talk to your doctor, pharmacist, or health care provider.  2023 Elsevier/Gold Standard (2020-12-04 00:00:00)

## 2023-01-24 NOTE — Progress Notes (Signed)
SURVIVORSHIP VISIT:   BRIEF ONCOLOGIC HISTORY:  Oncology History  Malignant neoplasm of upper-outer quadrant of right breast in female, estrogen receptor negative ([place])  11/04/2021 Initial Diagnosis    Patient has been breast-feeding.  Felt a painful right breast mass and an axillary mass.  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 with a Ki-67 of 40%, 1 abnormal lymph node 5.8 cm biopsy: Grade 3 IDC ER 5%, PR 0%, HER2 negative, Ki-67 40%   11/11/2021 Cancer Staging   Staging form: Breast, AJCC 8th Edition - Clinical stage from 11/11/2021: Stage IIIA (cT3, cN1, cM0, G3, ER+, PR+, HER2-) - Signed by Nicholas Lose, MD on 02/18/2022 Stage prefix: Initial diagnosis Histologic grading system: 3 grade system   11/30/2021 - 04/29/2022 Chemotherapy   Patient is on Treatment Plan : BREAST Pembrolizumab + Carboplatin D1 + Paclitaxel D1,8,15 q21d X 4 cycles / Pembrolizumab + AC q21d x 4 cycles     11/30/2021 Genetic Testing   Negative genetic testing on the CancerNext-Expanded+RNA insight panel.  The report date is 11/30/2021  The CancerNext-Expanded gene panel offered by [facility] and includes sequencing and rearrangement analysis for the following 77 genes: AIP, ALK, APC*, ATM*, AXIN2, BAP1, BARD1, BLM, BMPR1A, BRCA1*, BRCA2*, BRIP1*, CDC73, CDH1*, CDK4, CDKN1B, CDKN2A, CHEK2*, CTNNA1, DICER1, FANCC, FH, FLCN, GALNT12, KIF1B, LZTR1, MAX, MEN1, MET, MLH1*, MSH2*, MSH3, MSH6*, MUTYH*, NBN, NF1*, NF2, NTHL1, PALB2*, PHOX2B, PMS2*, POT1, PRKAR1A, PTCH1, PTEN*, RAD51C*, RAD51D*, RB1, RECQL, RET, SDHA, SDHAF2, SDHB, SDHC, SDHD, SMAD4, SMARCA4, SMARCB1, SMARCE1, STK11, SUFU, TMEM127, TP53*, TSC1, TSC2, VHL and XRCC2 (sequencing and deletion/duplication); EGFR, EGLN1, HOXB13, KIT, MITF, PDGFRA, POLD1, and POLE (sequencing only); EPCAM and GREM1 (deletion/duplication only). DNA and RNA analyses performed for * genes.    05/18/2022 - 08/19/2022 Chemotherapy   Patient is  on Treatment Plan : BREAST Pembrolizumab (200) q21d x 27 weeks     07/27/2022 - 09/10/2022 Radiation Therapy   50.4 Gy in 28 treatments "Boost": 6 Gy in 3 treatments     INTERVAL HISTORY:  Sydney Rios to review her survivorship care plan detailing her treatment course for breast cancer, as well as monitoring long-term side effects of that treatment, education regarding health maintenance, screening, and overall wellness and health promotion.     Overall, Sydney Rios reports feeling quite well.  She reports some anxiety and depression which are improving.  She tells me that previously, before her cancer diagnosis she was placed on Lexapro for this and it did not help.  She feels she is slowly improving however and notes it is not a big concern for her.   REVIEW OF SYSTEMS:  Review of Systems  Constitutional:  Positive for fatigue. Negative for appetite change, chills, fever and unexpected weight change.  HENT:   Negative for hearing loss, lump/mass and trouble swallowing.   Eyes:  Negative for eye problems and icterus.  Respiratory:  Negative for chest tightness, cough and shortness of breath.   Cardiovascular:  Negative for chest pain, leg swelling and palpitations.  Gastrointestinal:  Negative for abdominal distention, abdominal pain, constipation, diarrhea, nausea and vomiting.  Endocrine: Negative for hot flashes.  Genitourinary:  Negative for difficulty urinating.   Musculoskeletal:  Negative for arthralgias.  Skin:  Negative for itching and rash.  Neurological:  Negative for dizziness, extremity weakness, headaches and numbness.  Hematological:  Negative for adenopathy. Does not bruise/bleed easily.  Psychiatric/Behavioral:  Positive for depression. Negative for suicidal ideas. The patient is nervous/anxious.  Breast: Denies any new nodularity, masses, tenderness, nipple changes, or nipple discharge.        PAST MEDICAL/SURGICAL HISTORY:  Past Medical History:  Diagnosis  Date   Anxiety    Cancer ([place])    Right Breast CA   Chronic kidney disease    hx kidney stones   Complication of anesthesia    runs heart rate in the 30-40s in PACU   History of kidney stones    Hx gestational diabetes    Hx of Hashimoto thyroiditis    PONV (postoperative nausea and vomiting)    Past Surgical History:  Procedure Laterality Date   CESAREAN SECTION     CESAREAN SECTION  01/23/2013   Procedure: CESAREAN SECTION;  Surgeon: Luz Lex, MD;  Location: [facility];  Service: Obstetrics;  Laterality: N/A;   CESAREAN SECTION N/A 10/11/2014   Procedure: CESAREAN SECTION;  Surgeon: Cyril Mourning, MD;  Location: [facility];  Service: Obstetrics;  Laterality: N/A;   CESAREAN SECTION N/A 10/24/2018   Procedure: REPEAT CESAREAN SECTION;  Surgeon: Dian Queen, MD;  Location: [facility];  Service: Obstetrics;  Laterality: N/A;  Tracey RNFA   LITHOTRIPSY     PORTACATH PLACEMENT Left 11/25/2021   Procedure: PORT PLACEMENT;  Surgeon: Rolm Bookbinder, MD;  Location: [facility];  Service: General;  Laterality: Left;   WISDOM TOOTH EXTRACTION       ALLERGIES:  No Known Allergies   CURRENT MEDICATIONS:  Outpatient Encounter Medications as of 01/24/2023  Medication Sig Note   Ascorbic Acid (VITAMIN C) 1000 MG tablet Take 2,000 mg by mouth every 30 (thirty) days.    cholecalciferol (VITAMIN D3) 25 MCG (1000 UNIT) tablet Take 10,000 Units by mouth 3 (three) times a week.    levothyroxine (SYNTHROID) 150 MCG tablet TAKE 1 TABLET BY MOUTH EVERY DAY BEFORE BREAKFAST    MISC NATURAL PRODUCTS PO Take by mouth daily. berberine/herbal complex no.18 (BERBERINE-HERBAL COMB NO.18 ORAL)    Pectin Cit-Inos-C-Bioflav-Soy (MODIFIED CITRUS PECTIN PO) Take 1 tablet by mouth daily.    tretinoin (RETIN-A) 0.025 % cream SMARTSIG:sparingly Topical Every Night    acyclovir ointment (ZOVIRAX) 5 % 1 APPLICATION TOPICALLY TO AFFECTED AREA 6 TIMES PER DAY EVERY 3 HOURS (Patient not taking: Reported on  01/24/2023)    lidocaine-prilocaine (EMLA) cream APPLY TO AFFECTED AREA ONCE AS DIRECTED (Patient not taking: Reported on 01/24/2023)    valACYclovir (VALTREX) 1000 MG tablet Take 1 tablet (1,000 mg total) by mouth 3 (three) times daily. (Patient not taking: Reported on 01/24/2023)    [DISCONTINUED] prochlorperazine (COMPAZINE) 10 MG tablet Take 1 tablet (10 mg total) by mouth every 6 (six) hours as needed (Nausea or vomiting). 11/23/2021: New medication patient has not started    No facility-administered encounter medications on file as of 01/24/2023.     ONCOLOGIC FAMILY HISTORY:  Family History  Problem Relation Age of Onset   Hypertension Mother    Diabetes Mother    Hypertension Father    Hypertension Maternal Grandmother    Diabetes Maternal Grandmother    Heart disease Maternal Grandfather    Hypertension Maternal Grandfather    Heart disease Paternal Grandfather    Hypertension Paternal Grandfather    Retinoblastoma Cousin        dx 3 mo - left eye     SOCIAL HISTORY:  Social History   Socioeconomic History   Marital status: Married    Spouse name: Not on file   Number of children: Not on file  Years of education: Not on file   Highest education level: Not on file  Occupational History   Not on file  Tobacco Use   Smoking status: Never   Smokeless tobacco: Never  Vaping Use   Vaping Use: Never used  Substance and Sexual Activity   Alcohol use: No   Drug use: No   Sexual activity: Yes    Birth control/protection: Other-see comments    Comment: Husband has had a vasectomy  Other Topics Concern   Not on file  Social History Narrative   Not on file   Social Determinants of Health   Financial Resource Strain: Low Risk  (10/09/2018)   Overall Financial Resource Strain (CARDIA)    Difficulty of Paying Living Expenses: Not hard at all  Food Insecurity: No Food Insecurity (10/09/2018)   Hunger Vital Sign    Worried About Running Out of Food in the Last Year:  Never true    [place] in the Last Year: Never true  Transportation Needs: Unknown (10/09/2018)   PRAPARE - Hydrologist (Medical): No    Lack of Transportation (Non-Medical): Not on file  Physical Activity: Not on file  Stress: No Stress Concern Present (10/09/2018)   [place]    Feeling of Stress : Only a little  Social Connections: Not on file  Intimate Partner Violence: Not At Risk (10/09/2018)   Humiliation, Afraid, Rape, and Kick questionnaire    Fear of Current or Ex-Partner: No    Emotionally Abused: No    Physically Abused: No    Sexually Abused: No     OBSERVATIONS/OBJECTIVE:  BP (!) 104/47 (BP Location: Left Arm, Patient Position: Sitting)   Pulse 65   Temp 98.5 F (36.9 C) (Tympanic)   Resp 18   Ht '5\' 7"'$  (1.702 m)   Wt 145 lb 3.2 oz (65.9 kg)   SpO2 100%   BMI 22.74 kg/m  GENERAL: Patient is a well appearing female in no acute distress HEENT:  Sclerae anicteric.  Oropharynx clear and moist. No ulcerations or evidence of oropharyngeal candidiasis. Neck is supple.  NODES:  No cervical, supraclavicular, or axillary lymphadenopathy palpated.  BREAST EXAM:  Right breast s/p lumpectomy and patient no sign of local recurrence left breast is benign. LUNGS:  Clear to auscultation bilaterally.  No wheezes or rhonchi. HEART:  Regular rate and rhythm. No murmur appreciated. ABDOMEN:  Soft, nontender.  Positive, normoactive bowel sounds. No organomegaly palpated. MSK:  No focal spinal tenderness to palpation. Full range of motion bilaterally in the upper extremities. EXTREMITIES:  No peripheral edema.   SKIN:  Clear with no obvious rashes or skin changes. No nail dyscrasia. NEURO:  Nonfocal. Well oriented.  Appropriate affect.  LABORATORY DATA:  None for this visit.  DIAGNOSTIC IMAGING:  None for this visit.      ASSESSMENT AND PLAN:  Ms.. Rios is a pleasant 42  y.o. 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  She presents to the Survivorship Clinic for our initial meeting and routine follow-up post-completion of treatment for breast cancer.    1. Stage 3A right breast cancer:  Sydney Rios is continuing to recover from definitive treatment for breast cancer. She will follow-up with her medical oncologist, Dr. Lindi Adie in 6 months with history and physical exam per surveillance protocol.  She still has  her port and will keep her port flushes until she schedules her hernia repair which will be completed by Dr. Donne Hazel who will at that time remove her port. Her mammogram is due 05/2023; orders placed today. Today, a comprehensive survivorship care plan and treatment summary was reviewed with the patient today detailing her breast cancer diagnosis, treatment course, potential late/long-term effects of treatment, appropriate follow-up care with recommendations for the future, and patient education resources.  A copy of this summary, along with a letter will be sent to the patient's primary care provider via mail/fax/In Basket message after today's visit.    2.  Anxiety and depression: She will continue to see her therapist.  She is relieving a lot of these issues with physical activity and other nonpharmacologic interventions.  I gave her information on Effexor and she will let me know if she decides she wants a prescription for this.  3. Bone health:   She was given education on specific activities to promote bone health.  4. Cancer screening:  Due to Sydney Rios's history and her age, she should receive screening for skin cancers, colon cancer, and gynecologic cancers.  The information and recommendations are listed on the patient's comprehensive care plan/treatment summary and were reviewed in detail with the patient.     5. Health maintenance and wellness promotion: Sydney Rios was encouraged to consume 5-7 servings of fruits and vegetables per day. We reviewed the "Nutrition Rainbow" handout.  She was also encouraged to engage in moderate to vigorous exercise for 30 minutes per day most days of the week. She was instructed to limit her alcohol consumption and continue to abstain from tobacco use.     #. Support services/counseling: It is not uncommon for this period of the patient's cancer care trajectory to be one of many emotions and stressors. She was given information regarding our available services and encouraged to contact me with any questions or for help enrolling in any of our support group/programs.    Follow up instructions:    -Return to cancer center in 6 months for follow-up with Dr. Lindi Adie -Mammogram due in 05/2023 -She is welcome to return back to the Survivorship Clinic at any time; no additional follow-up needed at this time.  -Consider referral back to survivorship as a long-term survivor for continued surveillance  The patient was provided an opportunity to ask questions and all were answered. The patient agreed with the plan and demonstrated an understanding of the instructions.   Total encounter time:40 minutes*in face-to-face visit time, chart review, lab review, care coordination, order entry, and documentation of the encounter time.    Wilber Bihari, NP 01/24/23 11:28 AM Medical Oncology and Hematology [facility] [place], [place] [address] Tel. [phone]    Fax. [phone]  *Total Encounter Time as defined by the Centers for Medicare and Medicaid Services includes, in addition to the face-to-face time of a patient visit (documented in the note above) non-face-to-face time: obtaining and reviewing outside history, ordering and reviewing medications, tests or procedures, care coordination (communications with other health care professionals or  caregivers) and documentation in the medical record.

## 2023-01-25 LAB — T4: T4, Total: 6.6 ug/dL (ref 4.5–12.0)

## 2023-02-03 ENCOUNTER — Ambulatory Visit: Payer: 59

## 2023-02-04 LAB — SIGNATERA
SIGNATERA MTM READOUT: 0 MTM/ml
SIGNATERA TEST RESULT: NEGATIVE

## 2023-02-09 ENCOUNTER — Telehealth: Payer: Self-pay

## 2023-02-09 NOTE — Telephone Encounter (Signed)
Called pt per MD to advise [employer] testing was negative/not detected. Pt verbalized understanding of results and knows [employer] will be in touch to schedule 3 mo repeat lab.   

## 2023-02-10 ENCOUNTER — Inpatient Hospital Stay: Payer: 59 | Attending: Licensed Clinical Social Worker

## 2023-03-07 ENCOUNTER — Other Ambulatory Visit: Payer: Self-pay

## 2023-03-07 ENCOUNTER — Inpatient Hospital Stay: Payer: 59 | Attending: Licensed Clinical Social Worker

## 2023-03-07 DIAGNOSIS — Z452 Encounter for adjustment and management of vascular access device: Secondary | ICD-10-CM | POA: Diagnosis present

## 2023-03-07 DIAGNOSIS — Z171 Estrogen receptor negative status [ER-]: Secondary | ICD-10-CM | POA: Diagnosis not present

## 2023-03-07 DIAGNOSIS — Z95828 Presence of other vascular implants and grafts: Secondary | ICD-10-CM

## 2023-03-07 DIAGNOSIS — C50411 Malignant neoplasm of upper-outer quadrant of right female breast: Secondary | ICD-10-CM | POA: Insufficient documentation

## 2023-03-07 MED ORDER — HEPARIN SOD (PORK) LOCK FLUSH 100 UNIT/ML IV SOLN
500.0000 [IU] | Freq: Once | INTRAVENOUS | Status: AC
  Administered 2023-03-07: 500 [IU]

## 2023-03-07 MED ORDER — SODIUM CHLORIDE 0.9% FLUSH
10.0000 mL | Freq: Once | INTRAVENOUS | Status: AC
  Administered 2023-03-07: 10 mL

## 2023-04-18 ENCOUNTER — Other Ambulatory Visit: Payer: Self-pay

## 2023-04-18 ENCOUNTER — Inpatient Hospital Stay: Payer: 59 | Attending: Licensed Clinical Social Worker

## 2023-04-18 DIAGNOSIS — C50411 Malignant neoplasm of upper-outer quadrant of right female breast: Secondary | ICD-10-CM | POA: Insufficient documentation

## 2023-04-18 DIAGNOSIS — Z95828 Presence of other vascular implants and grafts: Secondary | ICD-10-CM

## 2023-04-18 DIAGNOSIS — Z452 Encounter for adjustment and management of vascular access device: Secondary | ICD-10-CM | POA: Diagnosis not present

## 2023-04-18 MED ORDER — SODIUM CHLORIDE 0.9% FLUSH
10.0000 mL | Freq: Once | INTRAVENOUS | Status: AC
  Administered 2023-04-18: 10 mL

## 2023-04-18 MED ORDER — HEPARIN SOD (PORK) LOCK FLUSH 100 UNIT/ML IV SOLN
500.0000 [IU] | Freq: Once | INTRAVENOUS | Status: AC
  Administered 2023-04-18: 500 [IU]

## 2023-05-05 LAB — SIGNATERA
SIGNATERA MTM READOUT: 0 MTM/ml
SIGNATERA TEST RESULT: NEGATIVE

## 2023-05-13 ENCOUNTER — Telehealth: Payer: Self-pay

## 2023-05-13 NOTE — Telephone Encounter (Signed)
Attempted to call pt regarding signatera results pt. did not answer the phone. Also was not a voicemail. If pt calls back please let pt know signatera results was negative and they will repeat labs in 3 months.

## 2023-05-27 ENCOUNTER — Telehealth: Payer: Self-pay

## 2023-05-27 ENCOUNTER — Encounter: Payer: Self-pay | Admitting: Hematology and Oncology

## 2023-05-27 NOTE — Telephone Encounter (Signed)
Pt asking for message to be sent to Dr. Dwain Sarna for port removal.   Per Serena Croissant, MD, OK to remove port during hernia repair surgery. Call [phone] and ask to speak with Dr. Earmon Phoenix nurse with any questions.  Message routed to Dr. Emelia Loron.

## 2023-05-30 ENCOUNTER — Other Ambulatory Visit: Payer: Self-pay | Admitting: *Deleted

## 2023-05-30 ENCOUNTER — Other Ambulatory Visit: Payer: Self-pay

## 2023-05-30 ENCOUNTER — Inpatient Hospital Stay: Payer: 59 | Attending: Hematology and Oncology

## 2023-05-30 ENCOUNTER — Encounter: Payer: Self-pay | Admitting: Hematology and Oncology

## 2023-05-30 DIAGNOSIS — D509 Iron deficiency anemia, unspecified: Secondary | ICD-10-CM | POA: Diagnosis not present

## 2023-05-30 DIAGNOSIS — R739 Hyperglycemia, unspecified: Secondary | ICD-10-CM

## 2023-05-30 DIAGNOSIS — Z95828 Presence of other vascular implants and grafts: Secondary | ICD-10-CM

## 2023-05-30 DIAGNOSIS — Z452 Encounter for adjustment and management of vascular access device: Secondary | ICD-10-CM | POA: Insufficient documentation

## 2023-05-30 DIAGNOSIS — C50411 Malignant neoplasm of upper-outer quadrant of right female breast: Secondary | ICD-10-CM | POA: Insufficient documentation

## 2023-05-30 DIAGNOSIS — Z171 Estrogen receptor negative status [ER-]: Secondary | ICD-10-CM | POA: Insufficient documentation

## 2023-05-30 LAB — TSH: TSH: 10.799 u[IU]/mL — ABNORMAL HIGH (ref 0.350–4.500)

## 2023-05-30 LAB — CMP (CANCER CENTER ONLY)
ALT: 10 U/L (ref 0–44)
AST: 14 U/L — ABNORMAL LOW (ref 15–41)
Albumin: 4.2 g/dL (ref 3.5–5.0)
Alkaline Phosphatase: 34 U/L — ABNORMAL LOW (ref 38–126)
Anion gap: 3 — ABNORMAL LOW (ref 5–15)
BUN: 17 mg/dL (ref 6–20)
CO2: 28 mmol/L (ref 22–32)
Calcium: 9.5 mg/dL (ref 8.9–10.3)
Chloride: 108 mmol/L (ref 98–111)
Creatinine: 0.72 mg/dL (ref 0.44–1.00)
GFR, Estimated: >60 mL/min (ref >60)
Glucose, Bld: 103 mg/dL — ABNORMAL HIGH (ref 70–99)
Potassium: 4.1 mmol/L (ref 3.5–5.1)
Sodium: 139 mmol/L (ref 135–145)
Total Bilirubin: 0.3 mg/dL (ref 0.3–1.2)
Total Protein: 6.7 g/dL (ref 6.5–8.1)

## 2023-05-30 LAB — CBC WITH DIFFERENTIAL (CANCER CENTER ONLY)
Abs Immature Granulocytes: 0 10*3/uL (ref 0.00–0.07)
Basophils Absolute: 0 10*3/uL (ref 0.0–0.1)
Basophils Relative: 1 %
Eosinophils Absolute: 0 10*3/uL (ref 0.0–0.5)
Eosinophils Relative: 1 %
HCT: 32.4 % — ABNORMAL LOW (ref 36.0–46.0)
Hemoglobin: 10.5 g/dL — ABNORMAL LOW (ref 12.0–15.0)
Immature Granulocytes: 0 %
Lymphocytes Relative: 21 %
Lymphs Abs: 0.5 10*3/uL — ABNORMAL LOW (ref 0.7–4.0)
MCH: 28.5 pg (ref 26.0–34.0)
MCHC: 32.4 g/dL (ref 30.0–36.0)
MCV: 88 fL (ref 80.0–100.0)
Monocytes Absolute: 0.2 10*3/uL (ref 0.1–1.0)
Monocytes Relative: 9 %
Neutro Abs: 1.4 10*3/uL — ABNORMAL LOW (ref 1.7–7.7)
Neutrophils Relative %: 68 %
Platelet Count: 240 10*3/uL (ref 150–400)
RBC: 3.68 MIL/uL — ABNORMAL LOW (ref 3.87–5.11)
RDW: 17.2 % — ABNORMAL HIGH (ref 11.5–15.5)
WBC Count: 2.1 10*3/uL — ABNORMAL LOW (ref 4.0–10.5)
nRBC: 0 % (ref 0.0–0.2)

## 2023-05-30 MED ORDER — SODIUM CHLORIDE 0.9% FLUSH
10.0000 mL | Freq: Once | INTRAVENOUS | Status: AC
  Administered 2023-05-30: 10 mL

## 2023-05-30 MED ORDER — HEPARIN SOD (PORK) LOCK FLUSH 100 UNIT/ML IV SOLN
500.0000 [IU] | Freq: Once | INTRAVENOUS | Status: AC
  Administered 2023-05-30: 500 [IU]

## 2023-05-30 NOTE — Patient Instructions (Signed)

## 2023-05-31 LAB — HEMOGLOBIN A1C
Hgb A1c MFr Bld: 5.8 % — ABNORMAL HIGH (ref 4.8–5.6)
Mean Plasma Glucose: 120 mg/dL

## 2023-05-31 LAB — T4: T4, Total: 6.9 ug/dL (ref 4.5–12.0)

## 2023-06-15 ENCOUNTER — Encounter: Payer: Self-pay | Admitting: Hematology and Oncology

## 2023-06-19 NOTE — Progress Notes (Signed)
Patient Care Team: Serena Croissant, MD as PCP - General (Hematology and Oncology) Serena Croissant, MD as Attending Physician (Hematology and Oncology) Dorothy Puffer, MD as Consulting Physician (Radiation Oncology) Dwana Curd, MD as Referring Physician (General Surgery)  DIAGNOSIS:  Encounter Diagnoses  Name Primary?   Malignant neoplasm of upper-outer quadrant of right breast in female, estrogen receptor negative (HCC) Yes   Other pancytopenia (HCC)     SUMMARY OF ONCOLOGIC HISTORY: Oncology History  Malignant neoplasm of upper-outer quadrant of right breast in female, estrogen receptor negative (HCC)  11/04/2021 Initial Diagnosis    Patient has been breast-feeding.  Felt a painful right breast mass and an axillary mass.  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 with a Ki-67 of 40%, 1 abnormal lymph node 5.8 cm biopsy: Grade 3 IDC ER 5%, PR 0%, HER2 negative, Ki-67 40%   11/11/2021 Cancer Staging   Staging form: Breast, AJCC 8th Edition - Clinical stage from 11/11/2021: Stage IIIA (cT3, cN1, cM0, G3, ER+, PR+, HER2-) - Signed by Serena Croissant, MD on 02/18/2022 Stage prefix: Initial diagnosis Histologic grading system: 3 grade system   11/30/2021 - 04/29/2022 Chemotherapy   Patient is on Treatment Plan : BREAST Pembrolizumab + Carboplatin D1 + Paclitaxel D1,8,15 q21d X 4 cycles / Pembrolizumab + AC q21d x 4 cycles     11/30/2021 Genetic Testing   Negative genetic testing on the CancerNext-Expanded+RNA insight panel.  The report date is 11/30/2021  The CancerNext-Expanded gene panel offered by [facility] and includes sequencing and rearrangement analysis for the following 77 genes: AIP, ALK, APC*, ATM*, AXIN2, BAP1, BARD1, BLM, BMPR1A, BRCA1*, BRCA2*, BRIP1*, CDC73, CDH1*, CDK4, CDKN1B, CDKN2A, CHEK2*, CTNNA1, DICER1, FANCC, FH, FLCN, GALNT12, KIF1B, LZTR1, MAX, MEN1, MET, MLH1*, MSH2*, MSH3, MSH6*, MUTYH*, NBN, NF1*, NF2, NTHL1, PALB2*,  PHOX2B, PMS2*, POT1, PRKAR1A, PTCH1, PTEN*, RAD51C*, RAD51D*, RB1, RECQL, RET, SDHA, SDHAF2, SDHB, SDHC, SDHD, SMAD4, SMARCA4, SMARCB1, SMARCE1, STK11, SUFU, TMEM127, TP53*, TSC1, TSC2, VHL and XRCC2 (sequencing and deletion/duplication); EGFR, EGLN1, HOXB13, KIT, MITF, PDGFRA, POLD1, and POLE (sequencing only); EPCAM and GREM1 (deletion/duplication only). DNA and RNA analyses performed for * genes.    05/18/2022 - 08/19/2022 Chemotherapy   Patient is on Treatment Plan : BREAST Pembrolizumab (200) q21d x 27 weeks     07/27/2022 - 09/10/2022 Radiation Therapy   50.4 Gy in 28 treatments "Boost": 6 Gy in 3 treatments     CHIEF COMPLIANT: Complains of low blood counts, fatigue and right-sided breast tenderness  INTERVAL HISTORY: WILODEAN PUROHIT is a 42 y.o with the above-mentioned right lumpectomy currently on Keytruda maintenance  She presents to the clinic for a follow-up. She reports that sh has been doing good. She does feel fatigue, not all the time but most of the time. She had some concerns about her labs. (Hbg) and (Wc). She says she doesn't eat as much as you use too. She eats a lot of green foods, such as vegetables and salads. Anxiety has gone up since Chemo. She has some lymphedema on right side of neck. She does feel some mild tingling in the right hand after she lifts it up.   ALLERGIES:  has No Known Allergies.  MEDICATIONS:  Current Outpatient Medications  Medication Sig Dispense Refill   Ascorbic Acid (VITAMIN C) 1000 MG tablet Take 2,000 mg by mouth every 30 (thirty) days.     cholecalciferol (VITAMIN D3) 25 MCG (1000 UNIT) tablet Take 10,000 Units by mouth 3 (  three) times a week.     levothyroxine (SYNTHROID) 150 MCG tablet TAKE 1 TABLET BY MOUTH EVERY DAY BEFORE BREAKFAST (Patient taking differently: 175 mcg. TAKE 1 TABLET BY MOUTH EVERY DAY BEFORE BREAKFAST) 90 tablet 1   valACYclovir (VALTREX) 1000 MG tablet Take 1 tablet (1,000 mg total) by mouth 3 (three) times daily. (Patient  taking differently: Take 1,000 mg by mouth 3 (three) times daily. Prn) 30 tablet 0   acyclovir ointment (ZOVIRAX) 5 % 1 APPLICATION TOPICALLY TO AFFECTED AREA 6 TIMES PER DAY EVERY 3 HOURS (Patient not taking: Reported on 06/22/2023)     lidocaine-prilocaine (EMLA) cream      MISC NATURAL PRODUCTS PO Take by mouth daily. berberine/herbal complex no.18 (BERBERINE-HERBAL COMB NO.18 ORAL)     Pectin Cit-Inos-C-Bioflav-Soy (MODIFIED CITRUS PECTIN PO) Take 1 tablet by mouth daily.     tretinoin (RETIN-A) 0.025 % cream SMARTSIG:sparingly Topical Every Night     No current facility-administered medications for this visit.    PHYSICAL EXAMINATION: ECOG PERFORMANCE STATUS: 1 - Symptomatic but completely ambulatory  Vitals:   06/22/23 1030  BP: 122/62  Pulse: 79  Resp: 18  Temp: (!) 97.3 F (36.3 C)  SpO2: 100%   Filed Weights   06/22/23 1030  Weight: 139 lb 11.2 oz (63.4 kg)    LABORATORY DATA:  I have reviewed the data as listed    Latest Ref Rng & Units 05/30/2023   10:25 AM 01/24/2023   10:41 AM 12/24/2022    3:23 PM  CMP  Glucose 70 - 99 mg/dL 409  91  811   BUN 6 - 20 mg/dL 17  10  11    Creatinine 0.44 - 1.00 mg/dL 9.14  7.82  9.56   Sodium 135 - 145 mmol/L 139  140  137   Potassium 3.5 - 5.1 mmol/L 4.1  4.1  3.6   Chloride 98 - 111 mmol/L 108  109  108   CO2 22 - 32 mmol/L 28  27  24    Calcium 8.9 - 10.3 mg/dL 9.5  9.8  9.2   Total Protein 6.5 - 8.1 g/dL 6.7  6.8  6.4   Total Bilirubin 0.3 - 1.2 mg/dL 0.3  0.4  0.3   Alkaline Phos 38 - 126 U/L 34  37  29   AST 15 - 41 U/L 14  12  12    ALT 0 - 44 U/L 10  8  8      Lab Results  Component Value Date   WBC 3.6 (L) 06/22/2023   HGB 10.5 (L) 06/22/2023   HCT 32.1 (L) 06/22/2023   MCV 88.4 06/22/2023   PLT 233 06/22/2023   NEUTROABS 2.6 06/22/2023    ASSESSMENT & PLAN:  Malignant neoplasm of upper-outer quadrant of right breast in female, estrogen receptor negative (HCC) 11/04/2021: Patient has been breast-feeding.  Felt  a painful right breast mass and an axillary mass.  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 with a Ki-67 of 40%, 1 abnormal lymph node 5.8 cm biopsy: Grade 3 IDC ER 5%, PR 0%, HER2 negative, Ki-67 40%   CT CAP 11/20/2021: Right breast cancer with axillary lymph nodes, mildly enlarged right retropectoral lymph nodes, right internal mammary lymph nodes Breast MRI 11/17/2021: Known breast cancer 2 cm, right axillary lymph node, non-mass enhancement 7 cm, borderline enlarged right internal mammary lymph nodes   Treatment Plan: based on multidisciplinary tumor board: 1. Neoadjuvant chemotherapy with Taxol  weekly 12 with Keytruda and carboplatin every 3 weeks x4 Adriamycin and Cytoxan, Keytruda dose dense 4 followed by  followed by Rande Lawman maintenance for 1 year completed 08/19/2022 2. 06/18/2022:Right lumpectomy (at [facility]): No residual invasive or in situ carcinoma.  0/5 lymph nodes 3. Followed by adjuvant radiation therapy to be completed September 2023.  ------------------------------------------------------------------------------------------------------ Breast cancer surveillance: Breast exam 06/22/2023: Benign Mammogram 05/10/2023: Benign Signatera May 2024: Negative  Pancytopenia: Patient has persistent leukopenia and anemia.  I discussed with the patient that this may be related to bone marrow suppression from prior chemotherapy.  In order to do a basic workup we would like to check for CBC iron studies B12 folate ANA and see if there is any other etiology behind the cytopenia.  Patient does not have any frequent infections.  I discussed with her that sometimes a bone marrow biopsy may be helpful in this situation.  Labs will be done today and I will call her tomorrow with the results of these tests    Orders Placed This Encounter  Procedures   CBC with Differential (Cancer Center Only)    Standing Status:   Future    Number of  Occurrences:   1    Standing Expiration Date:   06/21/2024   Folate    Standing Status:   Future    Number of Occurrences:   1    Standing Expiration Date:   06/21/2024   Vitamin B12    Standing Status:   Future    Number of Occurrences:   1    Standing Expiration Date:   06/21/2024   Ferritin    Standing Status:   Future    Number of Occurrences:   1    Standing Expiration Date:   06/21/2024   Iron and Iron Binding Capacity ([facility] only)    Standing Status:   Future    Number of Occurrences:   1    Standing Expiration Date:   06/21/2024   Retic Panel    Standing Status:   Future    Number of Occurrences:   1    Standing Expiration Date:   06/21/2024   ANA, IFA (with reflex)    Standing Status:   Future    Number of Occurrences:   1    Standing Expiration Date:   06/21/2024   The patient has a good understanding of the overall plan. she agrees with it. she will call with any problems that may develop before the next visit here. Total time spent: 30 mins including face to face time and time spent for planning, charting and co-ordination of care   Tamsen Meek, MD 06/22/23    I Janan Ridge am acting as a Neurosurgeon for [employer]  I have reviewed the above documentation for accuracy and completeness, and I agree with the above.

## 2023-06-22 ENCOUNTER — Encounter: Payer: Self-pay | Admitting: Hematology and Oncology

## 2023-06-22 ENCOUNTER — Ambulatory Visit: Payer: 59

## 2023-06-22 ENCOUNTER — Inpatient Hospital Stay (HOSPITAL_BASED_OUTPATIENT_CLINIC_OR_DEPARTMENT_OTHER): Payer: 59 | Admitting: Hematology and Oncology

## 2023-06-22 ENCOUNTER — Other Ambulatory Visit: Payer: Self-pay

## 2023-06-22 ENCOUNTER — Inpatient Hospital Stay: Payer: 59

## 2023-06-22 VITALS — BP 122/62 | HR 79 | Temp 97.3°F | Resp 18 | Ht 67.0 in | Wt 139.7 lb

## 2023-06-22 DIAGNOSIS — D61818 Other pancytopenia: Secondary | ICD-10-CM | POA: Diagnosis not present

## 2023-06-22 DIAGNOSIS — Z95828 Presence of other vascular implants and grafts: Secondary | ICD-10-CM

## 2023-06-22 DIAGNOSIS — Z171 Estrogen receptor negative status [ER-]: Secondary | ICD-10-CM | POA: Diagnosis not present

## 2023-06-22 DIAGNOSIS — C50411 Malignant neoplasm of upper-outer quadrant of right female breast: Secondary | ICD-10-CM

## 2023-06-22 LAB — CBC WITH DIFFERENTIAL (CANCER CENTER ONLY)
Abs Immature Granulocytes: 0.01 10*3/uL (ref 0.00–0.07)
Basophils Absolute: 0 10*3/uL (ref 0.0–0.1)
Basophils Relative: 1 %
Eosinophils Absolute: 0 10*3/uL (ref 0.0–0.5)
Eosinophils Relative: 0 %
HCT: 32.1 % — ABNORMAL LOW (ref 36.0–46.0)
Hemoglobin: 10.5 g/dL — ABNORMAL LOW (ref 12.0–15.0)
Immature Granulocytes: 0 %
Lymphocytes Relative: 18 %
Lymphs Abs: 0.6 10*3/uL — ABNORMAL LOW (ref 0.7–4.0)
MCH: 28.9 pg (ref 26.0–34.0)
MCHC: 32.7 g/dL (ref 30.0–36.0)
MCV: 88.4 fL (ref 80.0–100.0)
Monocytes Absolute: 0.3 10*3/uL (ref 0.1–1.0)
Monocytes Relative: 9 %
Neutro Abs: 2.6 10*3/uL (ref 1.7–7.7)
Neutrophils Relative %: 72 %
Platelet Count: 233 10*3/uL (ref 150–400)
RBC: 3.63 MIL/uL — ABNORMAL LOW (ref 3.87–5.11)
RDW: 16.5 % — ABNORMAL HIGH (ref 11.5–15.5)
WBC Count: 3.6 10*3/uL — ABNORMAL LOW (ref 4.0–10.5)
nRBC: 0 % (ref 0.0–0.2)

## 2023-06-22 LAB — VITAMIN B12: Vitamin B-12: 288 pg/mL (ref 180–914)

## 2023-06-22 LAB — FERRITIN: Ferritin: 6 ng/mL — ABNORMAL LOW (ref 11–307)

## 2023-06-22 LAB — RETIC PANEL
Immature Retic Fract: 15.7 % (ref 2.3–15.9)
RBC.: 3.63 MIL/uL — ABNORMAL LOW (ref 3.87–5.11)
Retic Count, Absolute: 60.3 10*3/uL (ref 19.0–186.0)
Retic Ct Pct: 1.7 % (ref 0.4–3.1)
Reticulocyte Hemoglobin: 31.7 pg (ref >27.9)

## 2023-06-22 LAB — IRON AND IRON BINDING CAPACITY (CC-WL,HP ONLY)
Iron: 85 ug/dL (ref 28–170)
Saturation Ratios: 18 % (ref 10.4–31.8)
TIBC: 466 ug/dL — ABNORMAL HIGH (ref 250–450)
UIBC: 381 ug/dL (ref 148–442)

## 2023-06-22 LAB — FOLATE: Folate: 15.2 ng/mL (ref >5.9)

## 2023-06-22 MED ORDER — HEPARIN SOD (PORK) LOCK FLUSH 100 UNIT/ML IV SOLN
500.0000 [IU] | Freq: Once | INTRAVENOUS | Status: AC
  Administered 2023-06-22: 500 [IU]

## 2023-06-22 MED ORDER — SODIUM CHLORIDE 0.9% FLUSH
10.0000 mL | Freq: Once | INTRAVENOUS | Status: AC
  Administered 2023-06-22: 10 mL

## 2023-06-22 NOTE — Assessment & Plan Note (Signed)
11/04/2021: Patient has been breast-feeding.  Felt a painful right breast mass and an axillary mass.  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 with a Ki-67 of 40%, 1 abnormal lymph node 5.8 cm biopsy: Grade 3 IDC ER 5%, PR 0%, HER2 negative, Ki-67 40%   CT CAP 11/20/2021: Right breast cancer with axillary lymph nodes, mildly enlarged right retropectoral lymph nodes, right internal mammary lymph nodes Breast MRI 11/17/2021: Known breast cancer 2 cm, right axillary lymph node, non-mass enhancement 7 cm, borderline enlarged right internal mammary lymph nodes   Treatment Plan: based on multidisciplinary tumor board: 1. Neoadjuvant chemotherapy with Taxol weekly 12 with Keytruda and carboplatin every 3 weeks x4 Adriamycin and Cytoxan, Keytruda dose dense 4 followed by  followed by Rande Lawman maintenance for 1 year completed 08/19/2022 2. 06/18/2022:Right lumpectomy (at [facility]): No residual invasive or in situ carcinoma.  0/5 lymph nodes 3. Followed by adjuvant radiation therapy to be completed September 2023.  ------------------------------------------------------------------------------------------------------ Breast cancer surveillance: Breast exam 06/22/2023: Benign Mammogram 05/10/2023: Benign Signatera May 2024: Negative  Return to clinic in 1 year for follow-up

## 2023-06-23 ENCOUNTER — Telehealth: Payer: Self-pay | Admitting: Hematology and Oncology

## 2023-06-23 ENCOUNTER — Inpatient Hospital Stay (HOSPITAL_BASED_OUTPATIENT_CLINIC_OR_DEPARTMENT_OTHER): Payer: 59 | Admitting: Hematology and Oncology

## 2023-06-23 DIAGNOSIS — Z171 Estrogen receptor negative status [ER-]: Secondary | ICD-10-CM | POA: Diagnosis not present

## 2023-06-23 DIAGNOSIS — D649 Anemia, unspecified: Secondary | ICD-10-CM | POA: Diagnosis not present

## 2023-06-23 DIAGNOSIS — D509 Iron deficiency anemia, unspecified: Secondary | ICD-10-CM | POA: Diagnosis not present

## 2023-06-23 DIAGNOSIS — C50411 Malignant neoplasm of upper-outer quadrant of right female breast: Secondary | ICD-10-CM

## 2023-06-23 NOTE — Telephone Encounter (Signed)
Scheduled appointment per 6/26 los. Patient is aware of the made appointment. 

## 2023-06-23 NOTE — Progress Notes (Signed)
HEMATOLOGY-ONCOLOGY TELEPHONE VISIT PROGRESS NOTE  I connected with our patient on 06/23/23 at  2:15 PM EDT by telephone and verified that I am speaking with the correct person using two identifiers.  I discussed the limitations, risks, security and privacy concerns of performing an evaluation and management service by telephone and the availability of in person appointments.  I also discussed with the patient that there may be a patient responsible charge related to this service. The patient expressed understanding and agreed to proceed.   History of Present Illness: Sydney Rios is a 42 y.o with the above-mentioned right lumpectomy currently on Keytruda maintenance  She presents to the clinic for a telephone follow-up to discuss the results of blood work drawn yesterday.  Oncology History  Malignant neoplasm of upper-outer quadrant of right breast in female, estrogen receptor negative (HCC)  11/04/2021 Initial Diagnosis    Patient has been breast-feeding.  Felt a painful right breast mass and an axillary mass.  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 with a Ki-67 of 40%, 1 abnormal lymph node 5.8 cm biopsy: Grade 3 IDC ER 5%, PR 0%, HER2 negative, Ki-67 40%   11/11/2021 Cancer Staging   Staging form: Breast, AJCC 8th Edition - Clinical stage from 11/11/2021: Stage IIIA (cT3, cN1, cM0, G3, ER+, PR+, HER2-) - Signed by Serena Croissant, MD on 02/18/2022 Stage prefix: Initial diagnosis Histologic grading system: 3 grade system   11/30/2021 - 04/29/2022 Chemotherapy   Patient is on Treatment Plan : BREAST Pembrolizumab + Carboplatin D1 + Paclitaxel D1,8,15 q21d X 4 cycles / Pembrolizumab + AC q21d x 4 cycles     11/30/2021 Genetic Testing   Negative genetic testing on the CancerNext-Expanded+RNA insight panel.  The report date is 11/30/2021  The CancerNext-Expanded gene panel offered by [facility] and includes sequencing and rearrangement analysis for  the following 77 genes: AIP, ALK, APC*, ATM*, AXIN2, BAP1, BARD1, BLM, BMPR1A, BRCA1*, BRCA2*, BRIP1*, CDC73, CDH1*, CDK4, CDKN1B, CDKN2A, CHEK2*, CTNNA1, DICER1, FANCC, FH, FLCN, GALNT12, KIF1B, LZTR1, MAX, MEN1, MET, MLH1*, MSH2*, MSH3, MSH6*, MUTYH*, NBN, NF1*, NF2, NTHL1, PALB2*, PHOX2B, PMS2*, POT1, PRKAR1A, PTCH1, PTEN*, RAD51C*, RAD51D*, RB1, RECQL, RET, SDHA, SDHAF2, SDHB, SDHC, SDHD, SMAD4, SMARCA4, SMARCB1, SMARCE1, STK11, SUFU, TMEM127, TP53*, TSC1, TSC2, VHL and XRCC2 (sequencing and deletion/duplication); EGFR, EGLN1, HOXB13, KIT, MITF, PDGFRA, POLD1, and POLE (sequencing only); EPCAM and GREM1 (deletion/duplication only). DNA and RNA analyses performed for * genes.    05/18/2022 - 08/19/2022 Chemotherapy   Patient is on Treatment Plan : BREAST Pembrolizumab (200) q21d x 27 weeks     07/27/2022 - 09/10/2022 Radiation Therapy   50.4 Gy in 28 treatments "Boost": 6 Gy in 3 treatments     REVIEW OF SYSTEMS:   Constitutional: Denies fevers, chills or abnormal weight loss All other systems were reviewed with the patient and are negative. Observations/Objective:     Assessment Plan:  Malignant neoplasm of upper-outer quadrant of right breast in female, estrogen receptor negative (HCC) 11/04/2021: Patient has been breast-feeding.  Felt a painful right breast mass and an axillary mass.  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 with a Ki-67 of 40%, 1 abnormal lymph node 5.8 cm biopsy: Grade 3 IDC ER 5%, PR 0%, HER2 negative, Ki-67 40%   CT CAP 11/20/2021: Right breast cancer with axillary lymph nodes, mildly enlarged right retropectoral lymph nodes, right internal mammary lymph nodes Breast MRI 11/17/2021: Known  breast cancer 2 cm, right axillary lymph node, non-mass enhancement 7 cm, borderline enlarged right internal mammary lymph nodes   Treatment Plan: based on multidisciplinary tumor board: 1. Neoadjuvant chemotherapy with Taxol weekly 12 with  Keytruda and carboplatin every 3 weeks x4 Adriamycin and Cytoxan, Keytruda dose dense 4 followed by  followed by Rande Lawman maintenance for 1 year completed 08/19/2022 2. 06/18/2022:Right lumpectomy (at [facility]): No residual invasive or in situ carcinoma.  0/5 lymph nodes 3. Followed by adjuvant radiation therapy to be completed September 2023.  ------------------------------------------------------------------------------------------------------ Breast cancer surveillance: Breast exam 06/22/2023: Benign Mammogram 05/10/2023: Benign Signatera May 2024: Negative  Iron deficiency anemia Lab review: 06/22/2023: Hemoglobin 10.5, WBC 3.6, platelets 233, reticulocyte count 1.7%, TIBC 466, ferritin 6, B12 288, folic acid 15.2  Based on the above results I recommended that she receive 3 doses of IV iron therapy  Recheck labs in 2 months and follow-up with me.  We can consider doing a bone marrow biopsy if she does not respond to iron infusion.    I discussed the assessment and treatment plan with the patient. The patient was provided an opportunity to ask questions and all were answered. The patient agreed with the plan and demonstrated an understanding of the instructions. The patient was advised to call back or seek an in-person evaluation if the symptoms worsen or if the condition fails to improve as anticipated.   I provided 12 minutes of non-face-to-face time during this encounter.  This includes time for charting and coordination of care   Tamsen Meek, MD  I Janan Ridge am acting as a scribe for Dr.Qusai Kem  I have reviewed the above documentation for accuracy and completeness, and I agree with the above.

## 2023-06-23 NOTE — Assessment & Plan Note (Signed)
11/04/2021: Patient has been breast-feeding.  Felt a painful right breast mass and an axillary mass.  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 with a Ki-67 of 40%, 1 abnormal lymph node 5.8 cm biopsy: Grade 3 IDC ER 5%, PR 0%, HER2 negative, Ki-67 40%   CT CAP 11/20/2021: Right breast cancer with axillary lymph nodes, mildly enlarged right retropectoral lymph nodes, right internal mammary lymph nodes Breast MRI 11/17/2021: Known breast cancer 2 cm, right axillary lymph node, non-mass enhancement 7 cm, borderline enlarged right internal mammary lymph nodes   Treatment Plan: based on multidisciplinary tumor board: 1. Neoadjuvant chemotherapy with Taxol weekly 12 with Keytruda and carboplatin every 3 weeks x4 Adriamycin and Cytoxan, Keytruda dose dense 4 followed by  followed by Rande Lawman maintenance for 1 year completed 08/19/2022 2. 06/18/2022:Right lumpectomy (at [facility]): No residual invasive or in situ carcinoma.  0/5 lymph nodes 3. Followed by adjuvant radiation therapy to be completed September 2023.  ------------------------------------------------------------------------------------------------------ Breast cancer surveillance: Breast exam 06/22/2023: Benign Mammogram 05/10/2023: Benign Signatera May 2024: Negative

## 2023-06-23 NOTE — Assessment & Plan Note (Signed)
Lab review: 06/22/2023: Hemoglobin 10.5, WBC 3.6, platelets 233, reticulocyte count 1.7%, TIBC 466, ferritin 6, B12 288, folic acid 15.2  Based on the above results I recommended that she receive 3 doses of IV iron therapy  Recheck labs in 2 months and follow-up with me

## 2023-06-26 LAB — ANTINUCLEAR ANTIBODIES, IFA: ANA Ab, IFA: NEGATIVE

## 2023-06-27 ENCOUNTER — Telehealth: Payer: Self-pay | Admitting: Hematology and Oncology

## 2023-06-27 NOTE — Telephone Encounter (Signed)
Scheduled appointments per 6/27 los. Patient is aware of the made appointments.

## 2023-06-27 NOTE — Telephone Encounter (Signed)
Spoke with patient confirming upcoming appointment  

## 2023-06-28 ENCOUNTER — Inpatient Hospital Stay: Payer: 59 | Attending: Licensed Clinical Social Worker

## 2023-06-28 ENCOUNTER — Encounter: Payer: Self-pay | Admitting: Hematology and Oncology

## 2023-06-28 VITALS — BP 107/44 | HR 55 | Temp 98.4°F | Resp 18

## 2023-06-28 DIAGNOSIS — D509 Iron deficiency anemia, unspecified: Secondary | ICD-10-CM | POA: Insufficient documentation

## 2023-06-28 DIAGNOSIS — C50411 Malignant neoplasm of upper-outer quadrant of right female breast: Secondary | ICD-10-CM | POA: Diagnosis present

## 2023-06-28 DIAGNOSIS — Z95828 Presence of other vascular implants and grafts: Secondary | ICD-10-CM

## 2023-06-28 MED ORDER — SODIUM CHLORIDE 0.9 % IV SOLN: Freq: Once | INTRAVENOUS | Status: AC

## 2023-06-28 MED ORDER — HEPARIN SOD (PORK) LOCK FLUSH 100 UNIT/ML IV SOLN
500.0000 [IU] | Freq: Once | INTRAVENOUS | Status: AC | PRN
  Administered 2023-06-28: 500 [IU]

## 2023-06-28 MED ORDER — SODIUM CHLORIDE 0.9% FLUSH
10.0000 mL | Freq: Once | INTRAVENOUS | Status: AC | PRN
  Administered 2023-06-28: 10 mL

## 2023-06-28 MED ORDER — SODIUM CHLORIDE 0.9 % IV SOLN
300.0000 mg | Freq: Once | INTRAVENOUS | Status: AC
  Administered 2023-06-28: 300 mg via INTRAVENOUS
  Filled 2023-06-28: qty 10

## 2023-06-28 NOTE — Patient Instructions (Signed)

## 2023-06-29 ENCOUNTER — Encounter: Payer: Self-pay | Admitting: Hematology and Oncology

## 2023-07-07 ENCOUNTER — Inpatient Hospital Stay: Payer: 59

## 2023-07-08 ENCOUNTER — Inpatient Hospital Stay: Payer: 59

## 2023-07-08 VITALS — BP 101/46 | HR 52 | Temp 98.2°F | Resp 18

## 2023-07-08 DIAGNOSIS — Z95828 Presence of other vascular implants and grafts: Secondary | ICD-10-CM

## 2023-07-08 DIAGNOSIS — D509 Iron deficiency anemia, unspecified: Secondary | ICD-10-CM | POA: Diagnosis not present

## 2023-07-08 MED ORDER — SODIUM CHLORIDE 0.9 % IV SOLN: Freq: Once | INTRAVENOUS | Status: AC

## 2023-07-08 MED ORDER — SODIUM CHLORIDE 0.9 % IV SOLN
300.0000 mg | Freq: Once | INTRAVENOUS | Status: AC
  Administered 2023-07-08: 300 mg via INTRAVENOUS
  Filled 2023-07-08: qty 200

## 2023-07-08 MED ORDER — SODIUM CHLORIDE 0.9% FLUSH
10.0000 mL | Freq: Once | INTRAVENOUS | Status: AC | PRN
  Administered 2023-07-08: 10 mL

## 2023-07-08 MED ORDER — HEPARIN SOD (PORK) LOCK FLUSH 100 UNIT/ML IV SOLN
500.0000 [IU] | Freq: Once | INTRAVENOUS | Status: AC | PRN
  Administered 2023-07-08: 500 [IU]

## 2023-07-08 NOTE — Patient Instructions (Signed)

## 2023-07-14 ENCOUNTER — Inpatient Hospital Stay: Payer: 59

## 2023-07-14 VITALS — BP 106/51 | HR 57 | Temp 99.0°F | Resp 16

## 2023-07-14 DIAGNOSIS — Z95828 Presence of other vascular implants and grafts: Secondary | ICD-10-CM

## 2023-07-14 DIAGNOSIS — D509 Iron deficiency anemia, unspecified: Secondary | ICD-10-CM | POA: Diagnosis not present

## 2023-07-14 MED ORDER — SODIUM CHLORIDE 0.9 % IV SOLN: Freq: Once | INTRAVENOUS | Status: AC

## 2023-07-14 MED ORDER — SODIUM CHLORIDE 0.9 % IV SOLN
300.0000 mg | Freq: Once | INTRAVENOUS | Status: AC
  Administered 2023-07-14: 300 mg via INTRAVENOUS
  Filled 2023-07-14: qty 300

## 2023-07-14 MED ORDER — SODIUM CHLORIDE 0.9% FLUSH
10.0000 mL | Freq: Once | INTRAVENOUS | Status: AC | PRN
  Administered 2023-07-14: 10 mL

## 2023-07-14 MED ORDER — HEPARIN SOD (PORK) LOCK FLUSH 100 UNIT/ML IV SOLN
500.0000 [IU] | Freq: Once | INTRAVENOUS | Status: AC | PRN
  Administered 2023-07-14: 500 [IU]

## 2023-07-21 ENCOUNTER — Telehealth: Payer: Self-pay

## 2023-07-21 ENCOUNTER — Other Ambulatory Visit: Payer: Self-pay

## 2023-07-21 DIAGNOSIS — M79604 Pain in right leg: Secondary | ICD-10-CM

## 2023-07-21 NOTE — Telephone Encounter (Signed)
This pt called with burning/pain/feeling of "heaviness" to her RLE. She has a hx of (R) brca but is not currently on any tx for it. She also has iron deficiency anemia. Recently received Venofer 07/14/23. Spoke with Jae Dire, [place] and she recommends doppler to her RLE for DVT r/o. Pt will go to [facility] 07/22/23 at 1245 for check in doppler, then was advised to go to [facility] for check in with [facility]. Pt is in agreement and verbalized thanks.

## 2023-07-21 NOTE — Progress Notes (Signed)
Symptom Management Consult Note [facility]    Patient Care Team: Serena Croissant, MD as PCP - General (Hematology and Oncology) Serena Croissant, MD as Attending Physician (Hematology and Oncology) Dorothy Puffer, MD as Consulting Physician (Radiation Oncology) Dwana Curd, MD as Referring Physician (General Surgery)    Name / MRN / DOB: Sydney Rios  [id number]  12-17-1981   Date of visit: 07/22/2023   Chief Complaint/Reason for visit: right leg pain   Current Therapy: right leg pain     ASSESSMENT & PLAN: Patient is a 42 y.o. female  with oncologic history of malignant neoplasm of upper-outer quadrant of right breast, estrogen receptor negativef ollowed by Dr. Pamelia Hoit.  I have viewed most recent oncology note and lab work.    #Malignant neoplasm of upper-outer quadrant of right breast, estrogen receptor negative  - Next appointment with oncologist is 09/07/23   #Right leg pain -Acute onset.  Swelling resolved after lymphatic drainage yesterday. -DVT study was obtained prior to clinic visit given her complaint of right leg heaviness and swelling.  Fortunately imaging is negative.  No DVT or comment of Baker's cyst. -Exam of right lower extremity is unremarkable.  Pain is not reproducible.  No signs of cellulitis or wound.  Ambulatory with normal gait. -Patient plans to follow-up with PT as she had improvement after home lymph drainage.  Patient knows to RTC if pain continues or worsens. -Strict ED precautions were discussed.       Heme/Onc History: Oncology History  Malignant neoplasm of upper-outer quadrant of right breast in female, estrogen receptor negative (HCC)  11/04/2021 Initial Diagnosis    Patient has been breast-feeding.  Felt a painful right breast mass and an axillary mass.  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 with a Ki-67 of 40%, 1 abnormal lymph node 5.8 cm biopsy: Grade 3 IDC ER 5%,  PR 0%, HER2 negative, Ki-67 40%   11/11/2021 Cancer Staging   Staging form: Breast, AJCC 8th Edition - Clinical stage from 11/11/2021: Stage IIIA (cT3, cN1, cM0, G3, ER+, PR+, HER2-) - Signed by Serena Croissant, MD on 02/18/2022 Stage prefix: Initial diagnosis Histologic grading system: 3 grade system   11/30/2021 - 04/29/2022 Chemotherapy   Patient is on Treatment Plan : BREAST Pembrolizumab + Carboplatin D1 + Paclitaxel D1,8,15 q21d X 4 cycles / Pembrolizumab + AC q21d x 4 cycles     11/30/2021 Genetic Testing   Negative genetic testing on the CancerNext-Expanded+RNA insight panel.  The report date is 11/30/2021  The CancerNext-Expanded gene panel offered by [facility] and includes sequencing and rearrangement analysis for the following 77 genes: AIP, ALK, APC*, ATM*, AXIN2, BAP1, BARD1, BLM, BMPR1A, BRCA1*, BRCA2*, BRIP1*, CDC73, CDH1*, CDK4, CDKN1B, CDKN2A, CHEK2*, CTNNA1, DICER1, FANCC, FH, FLCN, GALNT12, KIF1B, LZTR1, MAX, MEN1, MET, MLH1*, MSH2*, MSH3, MSH6*, MUTYH*, NBN, NF1*, NF2, NTHL1, PALB2*, PHOX2B, PMS2*, POT1, PRKAR1A, PTCH1, PTEN*, RAD51C*, RAD51D*, RB1, RECQL, RET, SDHA, SDHAF2, SDHB, SDHC, SDHD, SMAD4, SMARCA4, SMARCB1, SMARCE1, STK11, SUFU, TMEM127, TP53*, TSC1, TSC2, VHL and XRCC2 (sequencing and deletion/duplication); EGFR, EGLN1, HOXB13, KIT, MITF, PDGFRA, POLD1, and POLE (sequencing only); EPCAM and GREM1 (deletion/duplication only). DNA and RNA analyses performed for * genes.    05/18/2022 - 08/19/2022 Chemotherapy   Patient is on Treatment Plan : BREAST Pembrolizumab (200) q21d x 27 weeks     07/27/2022 - 09/10/2022 Radiation Therapy   50.4 Gy in 28 treatments "Boost": 6 Gy in 3 treatments  Interval history-: RAJVI Rios is a 42 y.o. female with oncologic history as above presenting to [facility] today with chief complaint of right leg pain.  Patient presents unaccompanied to clinic.  Patient states pain started x 2 days ago.  It is located on the back of her right leg,  upper thigh.  She describes the pain as sharp.  When present it is 8 out of 10 in severity.  Patient states pain is present when pressure is applied to her leg when sitting.  The pain only last for seconds at a time.  Pain does not radiate.  She has a history of sciatica approximately 11 years ago and not think this is similar although does not clearly recall those symptoms.  Patient states that day the pain started she also felt like her right leg was heavy.  She noticed that her right thigh was swollen compared to the left.  Patient states she did full body lymphatic drainage last night which she learned from physical therapy.  She admits to " urinating a lot afterwards" which means it was successful.  She states this morning the swelling has resolved.  Denies any numbness or tingling in the leg.  She did not take any over-the-counter medications prior to arrival.  She denies any recent increase in physical activity or injury that caused the pain.  Patient denies any bone pain.    ROS  All other systems are reviewed and are negative for acute change except as noted in the HPI.    No Known Allergies   Past Medical History:  Diagnosis Date   Anxiety    Cancer (HCC)    Right Breast CA   Chronic kidney disease    hx kidney stones   Complication of anesthesia    runs heart rate in the 30-40s in PACU   History of kidney stones    Hx gestational diabetes    Hx of Hashimoto thyroiditis    PONV (postoperative nausea and vomiting)      Past Surgical History:  Procedure Laterality Date   CESAREAN SECTION     CESAREAN SECTION  01/23/2013   Procedure: CESAREAN SECTION;  Surgeon: Turner Daniels, MD;  Location: [facility];  Service: Obstetrics;  Laterality: N/A;   CESAREAN SECTION N/A 10/11/2014   Procedure: CESAREAN SECTION;  Surgeon: Jeani Hawking, MD;  Location: [facility];  Service: Obstetrics;  Laterality: N/A;   CESAREAN SECTION N/A 10/24/2018   Procedure: REPEAT CESAREAN SECTION;  Surgeon:  Marcelle Overlie, MD;  Location: [facility];  Service: Obstetrics;  Laterality: N/A;  Tracey RNFA   LITHOTRIPSY     PORTACATH PLACEMENT Left 11/25/2021   Procedure: PORT PLACEMENT;  Surgeon: Emelia Loron, MD;  Location: [facility] OR;  Service: General;  Laterality: Left;   WISDOM TOOTH EXTRACTION      Social History   Socioeconomic History   Marital status: Married    Spouse name: Not on file   Number of children: Not on file   Years of education: Not on file   Highest education level: Not on file  Occupational History   Not on file  Tobacco Use   Smoking status: Never   Smokeless tobacco: Never  Vaping Use   Vaping status: Never Used  Substance and Sexual Activity   Alcohol use: No   Drug use: No   Sexual activity: Yes    Birth control/protection: Other-see comments    Comment: Husband has had a vasectomy  Other Topics Concern  Not on file  Social History Narrative   Not on file   Social Determinants of Health   Financial Resource Strain: Low Risk  (10/09/2018)   Overall Financial Resource Strain (CARDIA)    Difficulty of Paying Living Expenses: Not hard at all  Food Insecurity: No Food Insecurity (04/12/2022)   Received from [facility]   Hunger Vital Sign    Worried About Running Out of Food in the Last Year: Never true    Ran Out of Food in the Last Year: Never true  Transportation Needs: Unknown (10/09/2018)   PRAPARE - Administrator, Civil Service (Medical): No    Lack of Transportation (Non-Medical): Not on file  Physical Activity: Not on file  Stress: No Stress Concern Present (10/09/2018)   [employer] of Occupational Health - Occupational Stress Questionnaire    Feeling of Stress : Only a little  Social Connections: Unknown (04/25/2022)   Received from [facility]   Social Network    Social Network: Not on file  Intimate Partner Violence: Unknown (03/31/2022)   Received from [facility]   HITS    Physically Hurt: Not on  file    Insult or Talk Down To: Not on file    Threaten Physical Harm: Not on file    Scream or Curse: Not on file    Family History  Problem Relation Age of Onset   Hypertension Mother    Diabetes Mother    Hypertension Father    Hypertension Maternal Grandmother    Diabetes Maternal Grandmother    Heart disease Maternal Grandfather    Hypertension Maternal Grandfather    Heart disease Paternal Grandfather    Hypertension Paternal Grandfather    Retinoblastoma Cousin        dx 3 mo - left eye     Current Outpatient Medications:    acyclovir ointment (ZOVIRAX) 5 %, 1 APPLICATION TOPICALLY TO AFFECTED AREA 6 TIMES PER DAY EVERY 3 HOURS (Patient not taking: Reported on 06/22/2023), Disp: , Rfl:    Ascorbic Acid (VITAMIN C) 1000 MG tablet, Take 2,000 mg by mouth every 30 (thirty) days., Disp: , Rfl:    cholecalciferol (VITAMIN D3) 25 MCG (1000 UNIT) tablet, Take 10,000 Units by mouth 3 (three) times a week., Disp: , Rfl:    levothyroxine (SYNTHROID) 150 MCG tablet, TAKE 1 TABLET BY MOUTH EVERY DAY BEFORE BREAKFAST (Patient taking differently: 175 mcg. TAKE 1 TABLET BY MOUTH EVERY DAY BEFORE BREAKFAST), Disp: 90 tablet, Rfl: 1   lidocaine-prilocaine (EMLA) cream, , Disp: , Rfl:    MISC NATURAL PRODUCTS PO, Take by mouth daily. berberine/herbal complex no.18 (BERBERINE-HERBAL COMB NO.18 ORAL), Disp: , Rfl:    Pectin Cit-Inos-C-Bioflav-Soy (MODIFIED CITRUS PECTIN PO), Take 1 tablet by mouth daily., Disp: , Rfl:    tretinoin (RETIN-A) 0.025 % cream, SMARTSIG:sparingly Topical Every Night, Disp: , Rfl:    valACYclovir (VALTREX) 1000 MG tablet, Take 1 tablet (1,000 mg total) by mouth 3 (three) times daily. (Patient taking differently: Take 1,000 mg by mouth 3 (three) times daily. Prn), Disp: 30 tablet, Rfl: 0  PHYSICAL EXAM: ECOG FS:1 - Symptomatic but completely ambulatory    Vitals:   07/22/23 1350  BP: (!) 95/58  Pulse: 74  Resp: 16  Temp: 98.3 F (36.8 C)  TempSrc: Oral  SpO2:  100%  Weight: 144 lb 9.6 oz (65.6 kg)   Physical Exam Vitals and nursing note reviewed.  Constitutional:      Appearance: She is not  ill-appearing or toxic-appearing.  HENT:     Head: Normocephalic.  Eyes:     Conjunctiva/sclera: Conjunctivae normal.  Cardiovascular:     Rate and Rhythm: Normal rate.  Pulmonary:     Effort: Pulmonary effort is normal.  Abdominal:     General: There is no distension.  Musculoskeletal:        General: Normal range of motion.     Cervical back: Normal range of motion.     Comments: Right lower extremity does not appear swollen, same size as the left.  Compartments are soft. Pain is not reproducible.  No overlying skin changes.  No wound.  No palpable mass or abscess.  Skin:    General: Skin is warm and dry.  Neurological:     Mental Status: She is alert.     Comments: Ambulatory with normal gait.  Sensation intact to bilateral lower extremities.        LABORATORY DATA: I have reviewed the data as listed    Latest Ref Rng & Units 06/22/2023   11:29 AM 05/30/2023   10:25 AM 01/24/2023   10:41 AM  CBC  WBC 4.0 - 10.5 K/uL 3.6  2.1  2.7   Hemoglobin 12.0 - 15.0 g/dL 16.1  09.6  04.5   Hematocrit 36.0 - 46.0 % 32.1  32.4  30.3   Platelets 150 - 400 K/uL 233  240  194         Latest Ref Rng & Units 05/30/2023   10:25 AM 01/24/2023   10:41 AM 12/24/2022    3:23 PM  CMP  Glucose 70 - 99 mg/dL 409  91  811   BUN 6 - 20 mg/dL 17  10  11    Creatinine 0.44 - 1.00 mg/dL 9.14  7.82  9.56   Sodium 135 - 145 mmol/L 139  140  137   Potassium 3.5 - 5.1 mmol/L 4.1  4.1  3.6   Chloride 98 - 111 mmol/L 108  109  108   CO2 22 - 32 mmol/L 28  27  24    Calcium 8.9 - 10.3 mg/dL 9.5  9.8  9.2   Total Protein 6.5 - 8.1 g/dL 6.7  6.8  6.4   Total Bilirubin 0.3 - 1.2 mg/dL 0.3  0.4  0.3   Alkaline Phos 38 - 126 U/L 34  37  29   AST 15 - 41 U/L 14  12  12    ALT 0 - 44 U/L 10  8  8         RADIOGRAPHIC STUDIES (from last 24 hours if applicable) I have  personally reviewed the radiological images as listed and agreed with the findings in the report. VAS [place] LOWER EXTREMITY VENOUS (DVT)  Result Date: 07/22/2023  Lower Venous DVT Study Patient Name:  JOURI THREAT  Date of Exam:   07/22/2023 Medical Rec #: [id number]        Accession #:    [id number] Date of Birth: 05/13/1981        Patient Gender: F Patient Age:   4 years Exam Location:  [facility] Procedure:      VAS [place] LOWER EXTREMITY VENOUS (DVT) Referring Phys: Namon Cirri --------------------------------------------------------------------------------  Indications: Pain.  Risk Factors: Cancer. Comparison Study: No prior studies. Performing Technologist: Chanda Busing RVT  Examination Guidelines: A complete evaluation includes B-mode imaging, spectral Doppler, color Doppler, and power Doppler as needed of all accessible portions of each vessel. Bilateral testing is considered an integral  part of a complete examination. Limited examinations for reoccurring indications may be performed as noted. The reflux portion of the exam is performed with the patient in reverse Trendelenburg.  +---------+---------------+---------+-----------+----------+--------------+ RIGHT    CompressibilityPhasicitySpontaneityPropertiesThrombus Aging +---------+---------------+---------+-----------+----------+--------------+ CFV      Full           Yes      Yes                                 +---------+---------------+---------+-----------+----------+--------------+ SFJ      Full                                                        +---------+---------------+---------+-----------+----------+--------------+ FV Prox  Full                                                        +---------+---------------+---------+-----------+----------+--------------+ FV Mid   Full                                                         +---------+---------------+---------+-----------+----------+--------------+ FV DistalFull                                                        +---------+---------------+---------+-----------+----------+--------------+ PFV      Full                                                        +---------+---------------+---------+-----------+----------+--------------+ POP      Full           Yes      Yes                                 +---------+---------------+---------+-----------+----------+--------------+ PTV      Full                                                        +---------+---------------+---------+-----------+----------+--------------+ PERO     Full                                                        +---------+---------------+---------+-----------+----------+--------------+   +----+---------------+---------+-----------+----------+--------------+ LEFTCompressibilityPhasicitySpontaneityPropertiesThrombus Aging +----+---------------+---------+-----------+----------+--------------+ CFV Full  Yes      Yes                                 +----+---------------+---------+-----------+----------+--------------+     Summary: RIGHT: - There is no evidence of deep vein thrombosis in the lower extremity.  - No cystic structure found in the popliteal fossa.  LEFT: - No evidence of common femoral vein obstruction.  *See table(s) above for measurements and observations.    Preliminary         Visit Diagnosis: 1. Malignant neoplasm of upper-outer quadrant of right breast in female, estrogen receptor negative (HCC)   2. Right leg pain      No orders of the defined types were placed in this encounter.   All questions were answered. The patient knows to call the clinic with any problems, questions or concerns. No barriers to learning was detected.  A total of more than 20 minutes were spent on this encounter with face-to-face time and  non-face-to-face time, including preparing to see the patient, ordering tests, counseling the patient and coordination of care as outlined above.    Thank you for allowing me to participate in the care of this patient.    Shanon Ace, PA-C Department of Hematology/Oncology [facility] at [facility] Phone: [phone]  [phone]    07/22/2023 3:51 PM

## 2023-07-22 ENCOUNTER — Inpatient Hospital Stay: Payer: 59 | Admitting: Physician Assistant

## 2023-07-22 ENCOUNTER — Ambulatory Visit (HOSPITAL_COMMUNITY)
Admission: RE | Admit: 2023-07-22 | Discharge: 2023-07-22 | Disposition: A | Discharge status: Home / Self Care | Payer: 59 | Source: Ambulatory Visit | Attending: Physician Assistant | Admitting: Physician Assistant

## 2023-07-22 VITALS — BP 95/58 | HR 74 | Temp 98.3°F | Resp 16 | Wt 144.6 lb

## 2023-07-22 DIAGNOSIS — M79604 Pain in right leg: Secondary | ICD-10-CM | POA: Diagnosis not present

## 2023-07-22 DIAGNOSIS — Z171 Estrogen receptor negative status [ER-]: Secondary | ICD-10-CM | POA: Diagnosis not present

## 2023-07-22 DIAGNOSIS — C50411 Malignant neoplasm of upper-outer quadrant of right female breast: Secondary | ICD-10-CM | POA: Diagnosis not present

## 2023-07-22 NOTE — Progress Notes (Signed)
Right lower extremity venous duplex has been completed. Preliminary results can be found in CV Proc through chart review.  Results were given to Fleet Contras at [facility] office.  07/22/23 1:01 PM Olen Cordial RVT

## 2023-07-25 ENCOUNTER — Inpatient Hospital Stay: Payer: 59 | Admitting: Hematology and Oncology

## 2023-07-25 ENCOUNTER — Inpatient Hospital Stay: Payer: 59

## 2023-08-10 ENCOUNTER — Telehealth: Payer: Self-pay

## 2023-08-10 ENCOUNTER — Encounter: Payer: Self-pay | Admitting: Hematology and Oncology

## 2023-08-10 NOTE — Telephone Encounter (Signed)
Called pt per MD to advise [employer] testing was negative/not detected. Pt verbalized understanding of results and knows [employer] will be in touch to schedule 3 mo repeat lab.   

## 2023-09-05 ENCOUNTER — Encounter: Payer: Self-pay | Admitting: Hematology and Oncology

## 2023-09-05 ENCOUNTER — Inpatient Hospital Stay: Payer: Managed Care, Other (non HMO) | Attending: Hematology and Oncology

## 2023-09-05 DIAGNOSIS — Z95828 Presence of other vascular implants and grafts: Secondary | ICD-10-CM

## 2023-09-05 DIAGNOSIS — C50411 Malignant neoplasm of upper-outer quadrant of right female breast: Secondary | ICD-10-CM | POA: Diagnosis present

## 2023-09-05 DIAGNOSIS — F419 Anxiety disorder, unspecified: Secondary | ICD-10-CM | POA: Insufficient documentation

## 2023-09-05 DIAGNOSIS — N920 Excessive and frequent menstruation with regular cycle: Secondary | ICD-10-CM | POA: Insufficient documentation

## 2023-09-05 DIAGNOSIS — D509 Iron deficiency anemia, unspecified: Secondary | ICD-10-CM | POA: Diagnosis not present

## 2023-09-05 DIAGNOSIS — R21 Rash and other nonspecific skin eruption: Secondary | ICD-10-CM | POA: Insufficient documentation

## 2023-09-05 DIAGNOSIS — Z452 Encounter for adjustment and management of vascular access device: Secondary | ICD-10-CM | POA: Insufficient documentation

## 2023-09-05 DIAGNOSIS — E039 Hypothyroidism, unspecified: Secondary | ICD-10-CM

## 2023-09-05 DIAGNOSIS — D649 Anemia, unspecified: Secondary | ICD-10-CM

## 2023-09-05 DIAGNOSIS — Z170 Estrogen receptor positive status [ER+]: Secondary | ICD-10-CM | POA: Diagnosis not present

## 2023-09-05 DIAGNOSIS — F32A Depression, unspecified: Secondary | ICD-10-CM | POA: Insufficient documentation

## 2023-09-05 LAB — CBC WITH DIFFERENTIAL (CANCER CENTER ONLY)
Abs Immature Granulocytes: 0.01 10*3/uL (ref 0.00–0.07)
Basophils Absolute: 0 10*3/uL (ref 0.0–0.1)
Basophils Relative: 1 %
Eosinophils Absolute: 0 10*3/uL (ref 0.0–0.5)
Eosinophils Relative: 1 %
HCT: 35.8 % — ABNORMAL LOW (ref 36.0–46.0)
Hemoglobin: 12 g/dL (ref 12.0–15.0)
Immature Granulocytes: 0 %
Lymphocytes Relative: 15 %
Lymphs Abs: 0.6 10*3/uL — ABNORMAL LOW (ref 0.7–4.0)
MCH: 31.6 pg (ref 26.0–34.0)
MCHC: 33.5 g/dL (ref 30.0–36.0)
MCV: 94.2 fL (ref 80.0–100.0)
Monocytes Absolute: 0.3 10*3/uL (ref 0.1–1.0)
Monocytes Relative: 8 %
Neutro Abs: 3 10*3/uL (ref 1.7–7.7)
Neutrophils Relative %: 75 %
Platelet Count: 196 10*3/uL (ref 150–400)
RBC: 3.8 MIL/uL — ABNORMAL LOW (ref 3.87–5.11)
RDW: 15.6 % — ABNORMAL HIGH (ref 11.5–15.5)
WBC Count: 4 10*3/uL (ref 4.0–10.5)
nRBC: 0 % (ref 0.0–0.2)

## 2023-09-05 LAB — CMP (CANCER CENTER ONLY)
ALT: 11 U/L (ref 0–44)
AST: 11 U/L — ABNORMAL LOW (ref 15–41)
Albumin: 4.3 g/dL (ref 3.5–5.0)
Alkaline Phosphatase: 38 U/L (ref 38–126)
Anion gap: 4 — ABNORMAL LOW (ref 5–15)
BUN: 11 mg/dL (ref 6–20)
CO2: 25 mmol/L (ref 22–32)
Calcium: 9.5 mg/dL (ref 8.9–10.3)
Chloride: 111 mmol/L (ref 98–111)
Creatinine: 0.63 mg/dL (ref 0.44–1.00)
GFR, Estimated: >60 mL/min (ref >60)
Glucose, Bld: 116 mg/dL — ABNORMAL HIGH (ref 70–99)
Potassium: 3.7 mmol/L (ref 3.5–5.1)
Sodium: 140 mmol/L (ref 135–145)
Total Bilirubin: 0.4 mg/dL (ref 0.3–1.2)
Total Protein: 6.6 g/dL (ref 6.5–8.1)

## 2023-09-05 LAB — IRON AND IRON BINDING CAPACITY (CC-WL,HP ONLY)
Iron: 123 ug/dL (ref 28–170)
Saturation Ratios: 38 % — ABNORMAL HIGH (ref 10.4–31.8)
TIBC: 326 ug/dL (ref 250–450)
UIBC: 203 ug/dL (ref 148–442)

## 2023-09-05 LAB — TSH: TSH: 0.952 u[IU]/mL (ref 0.350–4.500)

## 2023-09-05 LAB — FERRITIN: Ferritin: 40 ng/mL (ref 11–307)

## 2023-09-05 MED ORDER — HEPARIN SOD (PORK) LOCK FLUSH 100 UNIT/ML IV SOLN
500.0000 [IU] | Freq: Once | INTRAVENOUS | Status: AC
  Administered 2023-09-05: 500 [IU]

## 2023-09-05 MED ORDER — SODIUM CHLORIDE 0.9% FLUSH
10.0000 mL | Freq: Once | INTRAVENOUS | Status: AC
  Administered 2023-09-05: 10 mL

## 2023-09-05 NOTE — Progress Notes (Signed)
Patient Care Team: Serena Croissant, MD as PCP - General (Hematology and Oncology) Serena Croissant, MD as Attending Physician (Hematology and Oncology) Dorothy Puffer, MD as Consulting Physician (Radiation Oncology) Dwana Curd, MD as Referring Physician (General Surgery)  DIAGNOSIS: No diagnosis found.  SUMMARY OF ONCOLOGIC HISTORY: Oncology History  Malignant neoplasm of upper-outer quadrant of right breast in female, estrogen receptor negative (HCC)  11/04/2021 Initial Diagnosis    Patient has been breast-feeding.  Felt a painful right breast mass and an axillary mass.  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 with a Ki-67 of 40%, 1 abnormal lymph node 5.8 cm biopsy: Grade 3 IDC ER 5%, PR 0%, HER2 negative, Ki-67 40%   11/11/2021 Cancer Staging   Staging form: Breast, AJCC 8th Edition - Clinical stage from 11/11/2021: Stage IIIA (cT3, cN1, cM0, G3, ER+, PR+, HER2-) - Signed by Serena Croissant, MD on 02/18/2022 Stage prefix: Initial diagnosis Histologic grading system: 3 grade system   11/30/2021 - 04/29/2022 Chemotherapy   Patient is on Treatment Plan : BREAST Pembrolizumab + Carboplatin D1 + Paclitaxel D1,8,15 q21d X 4 cycles / Pembrolizumab + AC q21d x 4 cycles     11/30/2021 Genetic Testing   Negative genetic testing on the CancerNext-Expanded+RNA insight panel.  The report date is 11/30/2021  The CancerNext-Expanded gene panel offered by [facility] and includes sequencing and rearrangement analysis for the following 77 genes: AIP, ALK, APC*, ATM*, AXIN2, BAP1, BARD1, BLM, BMPR1A, BRCA1*, BRCA2*, BRIP1*, CDC73, CDH1*, CDK4, CDKN1B, CDKN2A, CHEK2*, CTNNA1, DICER1, FANCC, FH, FLCN, GALNT12, KIF1B, LZTR1, MAX, MEN1, MET, MLH1*, MSH2*, MSH3, MSH6*, MUTYH*, NBN, NF1*, NF2, NTHL1, PALB2*, PHOX2B, PMS2*, POT1, PRKAR1A, PTCH1, PTEN*, RAD51C*, RAD51D*, RB1, RECQL, RET, SDHA, SDHAF2, SDHB, SDHC, SDHD, SMAD4, SMARCA4, SMARCB1, SMARCE1, STK11, SUFU,  TMEM127, TP53*, TSC1, TSC2, VHL and XRCC2 (sequencing and deletion/duplication); EGFR, EGLN1, HOXB13, KIT, MITF, PDGFRA, POLD1, and POLE (sequencing only); EPCAM and GREM1 (deletion/duplication only). DNA and RNA analyses performed for * genes.    05/18/2022 - 08/19/2022 Chemotherapy   Patient is on Treatment Plan : BREAST Pembrolizumab (200) q21d x 27 weeks     07/27/2022 - 09/10/2022 Radiation Therapy   50.4 Gy in 28 treatments "Boost": 6 Gy in 3 treatments     CHIEF COMPLIANT: Keytruda maintenance  INTERVAL HISTORY: Sydney Rios is a 42 y.o with the above-mentioned right lumpectomy currently on Keytruda maintenance  She presents to the clinic for a follow-up    ALLERGIES:  has No Known Allergies.  MEDICATIONS:  Current Outpatient Medications  Medication Sig Dispense Refill   acyclovir ointment (ZOVIRAX) 5 % 1 APPLICATION TOPICALLY TO AFFECTED AREA 6 TIMES PER DAY EVERY 3 HOURS (Patient not taking: Reported on 06/22/2023)     Ascorbic Acid (VITAMIN C) 1000 MG tablet Take 2,000 mg by mouth every 30 (thirty) days.     cholecalciferol (VITAMIN D3) 25 MCG (1000 UNIT) tablet Take 10,000 Units by mouth 3 (three) times a week.     levothyroxine (SYNTHROID) 150 MCG tablet TAKE 1 TABLET BY MOUTH EVERY DAY BEFORE BREAKFAST (Patient taking differently: 175 mcg. TAKE 1 TABLET BY MOUTH EVERY DAY BEFORE BREAKFAST) 90 tablet 1   lidocaine-prilocaine (EMLA) cream      MISC NATURAL PRODUCTS PO Take by mouth daily. berberine/herbal complex no.18 (BERBERINE-HERBAL COMB NO.18 ORAL)     Pectin Cit-Inos-C-Bioflav-Soy (MODIFIED CITRUS PECTIN PO) Take 1 tablet by mouth daily.     tretinoin (RETIN-A) 0.025 % cream SMARTSIG:sparingly Topical  Every Night     valACYclovir (VALTREX) 1000 MG tablet Take 1 tablet (1,000 mg total) by mouth 3 (three) times daily. (Patient taking differently: Take 1,000 mg by mouth 3 (three) times daily. Prn) 30 tablet 0   No current facility-administered medications for this visit.     PHYSICAL EXAMINATION: ECOG PERFORMANCE STATUS: {CHL ONC ECOG PS:[id number]}  There were no vitals filed for this visit. There were no vitals filed for this visit.  BREAST:*** No palpable masses or nodules in either right or left breasts. No palpable axillary supraclavicular or infraclavicular adenopathy no breast tenderness or nipple discharge. (exam performed in the presence of a chaperone)  LABORATORY DATA:  I have reviewed the data as listed    Latest Ref Rng & Units 05/30/2023   10:25 AM 01/24/2023   10:41 AM 12/24/2022    3:23 PM  CMP  Glucose 70 - 99 mg/dL 841  91  324   BUN 6 - 20 mg/dL 17  10  11    Creatinine 0.44 - 1.00 mg/dL 4.01  0.27  2.53   Sodium 135 - 145 mmol/L 139  140  137   Potassium 3.5 - 5.1 mmol/L 4.1  4.1  3.6   Chloride 98 - 111 mmol/L 108  109  108   CO2 22 - 32 mmol/L 28  27  24    Calcium 8.9 - 10.3 mg/dL 9.5  9.8  9.2   Total Protein 6.5 - 8.1 g/dL 6.7  6.8  6.4   Total Bilirubin 0.3 - 1.2 mg/dL 0.3  0.4  0.3   Alkaline Phos 38 - 126 U/L 34  37  29   AST 15 - 41 U/L 14  12  12    ALT 0 - 44 U/L 10  8  8      Lab Results  Component Value Date   WBC 3.6 (L) 06/22/2023   HGB 10.5 (L) 06/22/2023   HCT 32.1 (L) 06/22/2023   MCV 88.4 06/22/2023   PLT 233 06/22/2023   NEUTROABS 2.6 06/22/2023    ASSESSMENT & PLAN:  No problem-specific Assessment & Plan notes found for this encounter.    No orders of the defined types were placed in this encounter.  The patient has a good understanding of the overall plan. she agrees with it. she will call with any problems that may develop before the next visit here. Total time spent: 30 mins including face to face time and time spent for planning, charting and co-ordination of care   Sherlyn Lick, CMA 09/05/23    I Janan Ridge am acting as a Neurosurgeon for [employer]  ***

## 2023-09-06 LAB — T4: T4, Total: 8.5 ug/dL (ref 4.5–12.0)

## 2023-09-07 ENCOUNTER — Inpatient Hospital Stay (HOSPITAL_BASED_OUTPATIENT_CLINIC_OR_DEPARTMENT_OTHER): Payer: Managed Care, Other (non HMO) | Admitting: Hematology and Oncology

## 2023-09-07 VITALS — BP 104/52 | HR 73 | Temp 97.5°F | Resp 18 | Ht 67.0 in | Wt 139.2 lb

## 2023-09-07 DIAGNOSIS — Z171 Estrogen receptor negative status [ER-]: Secondary | ICD-10-CM | POA: Diagnosis not present

## 2023-09-07 DIAGNOSIS — D509 Iron deficiency anemia, unspecified: Secondary | ICD-10-CM | POA: Diagnosis not present

## 2023-09-07 DIAGNOSIS — C50411 Malignant neoplasm of upper-outer quadrant of right female breast: Secondary | ICD-10-CM

## 2023-09-07 NOTE — Assessment & Plan Note (Signed)
11/04/2021: Patient has been breast-feeding.  Felt a painful right breast mass and an axillary mass.  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 with a Ki-67 of 40%, 1 abnormal lymph node 5.8 cm biopsy: Grade 3 IDC ER 5%, PR 0%, HER2 negative, Ki-67 40%   CT CAP 11/20/2021: Right breast cancer with axillary lymph nodes, mildly enlarged right retropectoral lymph nodes, right internal mammary lymph nodes Breast MRI 11/17/2021: Known breast cancer 2 cm, right axillary lymph node, non-mass enhancement 7 cm, borderline enlarged right internal mammary lymph nodes   Treatment Plan: based on multidisciplinary tumor board: 1. Neoadjuvant chemotherapy with Taxol weekly 12 with Keytruda and carboplatin every 3 weeks x4 Adriamycin and Cytoxan, Keytruda dose dense 4 followed by  followed by Rande Lawman maintenance for 1 year completed 08/19/2022 2. 06/18/2022:Right lumpectomy (at [facility]): No residual invasive or in situ carcinoma.  0/5 lymph nodes 3. Followed by adjuvant radiation therapy completed September 2023.  ------------------------------------------------------------------------------------------------------ Breast cancer surveillance: Breast exam 06/22/2023: Benign Mammogram 05/10/2023: Benign Signatera May 2024: Negative

## 2023-09-07 NOTE — Assessment & Plan Note (Signed)
Lab review: 06/22/2023: Hemoglobin 10.5, WBC 3.6, platelets 233, reticulocyte count 1.7%, TIBC 466, ferritin 6, B12 288, folic acid 15.2 09/05/2023: Hemoglobin 12, MCV 94.2, iron saturation 38%, ferritin 40  IV iron: July 2024 No role of IV iron based upon above lab results. Recheck labs in 3 months and follow-up 2 days later by telephone visit

## 2023-09-08 ENCOUNTER — Other Ambulatory Visit: Payer: Self-pay | Admitting: *Deleted

## 2023-09-08 DIAGNOSIS — Z171 Estrogen receptor negative status [ER-]: Secondary | ICD-10-CM

## 2023-09-09 ENCOUNTER — Inpatient Hospital Stay: Payer: Managed Care, Other (non HMO)

## 2023-09-09 NOTE — Progress Notes (Signed)
[facility] Clinical Social Work  Clinical Social Work was referred by medical provider for assistance with locating a trauma therapist. Clinical Social Worker contacted patient by phone to determine needs and preferences for external therapist. Therapist received verbal authorization to send options for therapist via email on file.  Therapist sent options to her email.    Marguerita Merles, LCSWA Clinical Social Worker [facility]

## 2023-10-06 ENCOUNTER — Encounter: Payer: Self-pay | Admitting: Hematology and Oncology

## 2023-10-06 ENCOUNTER — Other Ambulatory Visit: Payer: Self-pay

## 2023-10-06 DIAGNOSIS — Z171 Estrogen receptor negative status [ER-]: Secondary | ICD-10-CM

## 2023-10-13 ENCOUNTER — Encounter: Payer: Self-pay | Admitting: Hematology and Oncology

## 2023-10-17 ENCOUNTER — Ambulatory Visit: Payer: Managed Care, Other (non HMO) | Attending: Hematology and Oncology | Admitting: Rehabilitation

## 2023-10-17 ENCOUNTER — Encounter: Payer: Self-pay | Admitting: Hematology and Oncology

## 2023-10-17 ENCOUNTER — Other Ambulatory Visit: Payer: Self-pay

## 2023-10-17 ENCOUNTER — Encounter: Payer: Self-pay | Admitting: Rehabilitation

## 2023-10-17 DIAGNOSIS — Z171 Estrogen receptor negative status [ER-]: Secondary | ICD-10-CM | POA: Diagnosis present

## 2023-10-17 DIAGNOSIS — Z483 Aftercare following surgery for neoplasm: Secondary | ICD-10-CM | POA: Diagnosis present

## 2023-10-17 DIAGNOSIS — M25611 Stiffness of right shoulder, not elsewhere classified: Secondary | ICD-10-CM | POA: Diagnosis present

## 2023-10-17 DIAGNOSIS — C50411 Malignant neoplasm of upper-outer quadrant of right female breast: Secondary | ICD-10-CM | POA: Diagnosis not present

## 2023-10-17 DIAGNOSIS — C50211 Malignant neoplasm of upper-inner quadrant of right female breast: Secondary | ICD-10-CM | POA: Diagnosis present

## 2023-10-17 NOTE — Therapy (Signed)
OUTPATIENT PHYSICAL THERAPY ONCOLOGY EVALUATION  Patient Name: KES WESTMORELAND MRN: [id number] DOB:Aug 26, 1981, 42 y.o., female Today's Date: 10/17/2023   PT End of Session - 10/17/23 1013     Visit Number 1    Number of Visits 5    Date for PT Re-Evaluation 11/21/23    PT Start Time 1010    PT Stop Time 1052    PT Time Calculation (min) 42 min    Activity Tolerance Patient tolerated treatment well    Behavior During Therapy WFL for tasks assessed/performed              Past Medical History:  Diagnosis Date   Anxiety    Cancer (HCC)    Right Breast CA   Chronic kidney disease    hx kidney stones   Complication of anesthesia    runs heart rate in the 30-40s in PACU   History of kidney stones    Hx gestational diabetes    Hx of Hashimoto thyroiditis    PONV (postoperative nausea and vomiting)    Past Surgical History:  Procedure Laterality Date   CESAREAN SECTION     CESAREAN SECTION  01/23/2013   Procedure: CESAREAN SECTION;  Surgeon: Turner Daniels, MD;  Location: [facility];  Service: Obstetrics;  Laterality: N/A;   CESAREAN SECTION N/A 10/11/2014   Procedure: CESAREAN SECTION;  Surgeon: Jeani Hawking, MD;  Location: [facility];  Service: Obstetrics;  Laterality: N/A;   CESAREAN SECTION N/A 10/24/2018   Procedure: REPEAT CESAREAN SECTION;  Surgeon: Marcelle Overlie, MD;  Location: [facility];  Service: Obstetrics;  Laterality: N/A;  Tracey RNFA   LITHOTRIPSY     PORTACATH PLACEMENT Left 11/25/2021   Procedure: PORT PLACEMENT;  Surgeon: Emelia Loron, MD;  Location: [facility] OR;  Service: General;  Laterality: Left;   WISDOM TOOTH EXTRACTION     Patient Active Problem List   Diagnosis Date Noted   Iron deficiency anemia 06/23/2023   Incisional hernia, without obstruction or gangrene 11/01/2022   Genetic testing 12/01/2021   Port-A-Cath in place 11/30/2021   Malignant neoplasm of upper-outer quadrant of right breast in female, estrogen receptor negative (HCC)  11/11/2021   Abnormal maternal glucose tolerance, antepartum 09/15/2018   S/P cesarean section 10/11/2014   TACHYCARDIA 05/01/2010    PCP: Nadyne Coombes, MD  REFERRING PROVIDER: Serena Croissant, MD  REFERRING DIAG: C50.411,Z17.1 (ICD-10-CM) - Malignant neoplasm of upper-outer quadrant of right breast in female, estrogen receptor negative (HCC)  THERAPY DIAG:  Malignant neoplasm of upper-inner quadrant of right breast in female, estrogen receptor negative (HCC)  Aftercare following surgery for neoplasm  Stiffness of right shoulder, not elsewhere classified  ONSET DATE: 06/18/22  Rationale for Evaluation and Treatment Rehabilitation  SUBJECTIVE  SUBJECTIVE STATEMENT: I started feeling that cording again.  I recently had surgery in the abdomen for a hernia.  The breast is stable and swells every so often.  The bra and massage help.  Wearing abdominal binder x 6 weeks for the incision.    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  Completed chemo 11/30/21-04/29/22. 06/18/22- R lumpectomy 0/5 lymph nodes. Completed radiation.  Seen here previously.  Hernia surgery across the belly 10/01/23.  All healed.    PAIN:  Are you having pain? No Pain is when I reach overhead it pulls all the way to the elbow.  Rt upper arm and axilla   PRECAUTIONS: Other: at risk for lymphedema on R side  WEIGHT BEARING RESTRICTIONS No  FALLS:  Has patient fallen in last 6 months?  LIVING ENVIRONMENT: Lives with: lives with their family husband and 4 kids Lives in: House/apartment Stairs: Yes; Internal: 14 steps; can reach both Has following equipment at home: None  OCCUPATION:    LEISURE: not much after abdominal surgery   HAND DOMINANCE : right   PRIOR LEVEL OF FUNCTION: Independent  PATIENT GOALS ; get rid of  cording pull again   OBJECTIVE  COGNITION:  Overall cognitive status: Within functional limits for tasks assessed   PALPATION: Cord palpated axilla/pectoralis border into medial upper arm around half way then disappeared  OBSERVATIONS / OTHER ASSESSMENTS: NA  POSTURE: forward head, rounded shoulders  UPPER EXTREMITY AROM/PROM:  A/PROM RIGHT   12/31/22  10/17/23  Shoulder extension 56 65- feels cording pull  Shoulder flexion 152 160- feels cording pull   Shoulder abduction 173 173 - feels cording pull   Shoulder internal rotation 63   Shoulder external rotation 106 95    (Blank rows = not tested)  LYMPHEDEMA ASSESSMENTS:  SURGERY TYPE/DATE: R lumpectomy and SLNB 06/18/22 NUMBER OF LYMPH NODES REMOVED: 5 - all negative CHEMOTHERAPY: completed  RADIATION:completed HORMONE TREATMENT: pt does not require INFECTIONS: none  LYMPHEDEMA ASSESSMENTS:   LANDMARK RIGHT  12/31/22  10 cm proximal to olecranon process 26.5  Olecranon process 24.5  10 cm proximal to ulnar styloid process 19.1  Just proximal to ulnar styloid process 14.7  Across hand at thumb web space 18.6  At base of 2nd digit 5.6  (Blank rows = not tested)  LANDMARK LEFT  12/31/22  10 cm proximal to olecranon process 26.2  Olecranon process 25  10 cm proximal to ulnar styloid process 19.3  Just proximal to ulnar styloid process 14.6  Across hand at thumb web space 18.5  At base of 2nd digit 5.5  (Blank rows = not tested)   QUICK DASH SURVEY: 16% on 12/31/22, 11% on 10/17/23  LDEX: 1.9: still in green  TODAY'S TREATMENT  10/17/23: measured for sleeve per pt request:  sigvaris secure 15-20 pink, size S2 which pt will self order - pt requested pink Updated HEP   PATIENT EDUCATION:  Education details: Per today's note Person educated: Patient Education method: Explanation, Demonstration, Verbal cues, and Handouts Education comprehension: verbalized understanding  HOME EXERCISE PROGRAM: Access Code:  [email or web address] URL: [email or web address] Date: 10/17/2023 Prepared by: Gwenevere Abbot  Exercises - Single Arm Doorway Pec Stretch at 120 Degrees Abduction  - 1-2 x daily - 7 x weekly - 1 sets - 3 reps - 20sec hold - Supine I's, Y's, T's  - 1-2 x daily - 7 x weekly - 1 sets - 5 reps - 3 second hold  ASSESSMENT:  CLINICAL IMPRESSION: Pt returns with return of  Rt UE cording for no known reason.  She continues with excellent SOZO, good knowledge of self massage and use of bra for any breast issues, but has not been doing any specific stretches for her cording.  She requested a sleeve for flying and exercise so she will self order per above.  Educated pt on new stretches to start.  No MT due to arriving late.  Will restart PT visits.   OBJECTIVE IMPAIRMENTS decreased ROM, increased edema, increased fascial restrictions, increased muscle spasms, impaired UE functional use, and postural dysfunction.   ACTIVITY LIMITATIONS lifting and reach over head  PARTICIPATION LIMITATIONS:  none  PERSONAL FACTORS radiation hx, SLNB are also affecting patient's functional outcome.   REHAB POTENTIAL: Excellent  CLINICAL DECISION MAKING: Stable/uncomplicated  EVALUATION COMPLEXITY: Low  GOALS: Goals reviewed with patient? Yes  SHORT TERM GOALS=LONG TERM GOALS  Target date: 11/21/23  Pt will demonstrate no upper arm pull with R shoulder flexion to allow her to reach overhead.  Baseline:  Goal status: INITIAL  2.  Pt will demonstrate no upper arm pull with Rt shoulder abduction to allow her to reach up and out to the side.  Baseline:  Goal status: INITIAL   3.  Pt will be independent in a home exercise program for continued stretching and strengthening.  Baseline:  Goal status: INITIAL  PLAN: PT FREQUENCY: 1x per week   PT DURATION: 5 weeks   PLANNED INTERVENTIONS: Therapeutic exercises, Therapeutic activity, Patient/Family education, Self Care, Orthotic/Fit training, Manual lymph  drainage, Compression bandaging, scar mobilization, Taping, Vasopneumatic device, and Manual therapy  PLAN FOR NEXT SESSION: PROM to R shoulder, MFR to cording, wearing abdominal binder due to hernia surgery.     Idamae Lusher, PT 10/17/2023, 10:53 AM

## 2023-10-21 ENCOUNTER — Other Ambulatory Visit: Payer: Self-pay | Admitting: *Deleted

## 2023-10-21 DIAGNOSIS — Z171 Estrogen receptor negative status [ER-]: Secondary | ICD-10-CM

## 2023-10-21 NOTE — Progress Notes (Signed)
Renewal orders placed for Signatera testing.

## 2023-10-27 ENCOUNTER — Ambulatory Visit: Payer: Managed Care, Other (non HMO) | Admitting: Rehabilitation

## 2023-10-31 ENCOUNTER — Other Ambulatory Visit: Payer: Managed Care, Other (non HMO)

## 2023-11-03 ENCOUNTER — Encounter: Payer: Self-pay | Admitting: Rehabilitation

## 2023-11-03 ENCOUNTER — Ambulatory Visit: Payer: Managed Care, Other (non HMO) | Attending: Hematology and Oncology | Admitting: Rehabilitation

## 2023-11-03 DIAGNOSIS — Z483 Aftercare following surgery for neoplasm: Secondary | ICD-10-CM | POA: Diagnosis present

## 2023-11-03 DIAGNOSIS — R600 Localized edema: Secondary | ICD-10-CM | POA: Insufficient documentation

## 2023-11-03 DIAGNOSIS — R293 Abnormal posture: Secondary | ICD-10-CM | POA: Diagnosis present

## 2023-11-03 DIAGNOSIS — C50211 Malignant neoplasm of upper-inner quadrant of right female breast: Secondary | ICD-10-CM | POA: Diagnosis present

## 2023-11-03 DIAGNOSIS — Z171 Estrogen receptor negative status [ER-]: Secondary | ICD-10-CM | POA: Insufficient documentation

## 2023-11-03 DIAGNOSIS — M25611 Stiffness of right shoulder, not elsewhere classified: Secondary | ICD-10-CM | POA: Diagnosis present

## 2023-11-03 NOTE — Therapy (Signed)
OUTPATIENT PHYSICAL THERAPY ONCOLOGY tREATMENT  Patient Name: Sydney Rios MRN: [id number] DOB:24-Jun-1981, 42 y.o., female Today's Date: 11/03/2023   PT End of Session - 11/03/23 1001     Visit Number 2    Number of Visits 5    Date for PT Re-Evaluation 11/21/23    PT Start Time 1002    PT Stop Time 1047    PT Time Calculation (min) 45 min    Activity Tolerance Patient tolerated treatment well    Behavior During Therapy WFL for tasks assessed/performed              Past Medical History:  Diagnosis Date   Anxiety    Cancer (HCC)    Right Breast CA   Chronic kidney disease    hx kidney stones   Complication of anesthesia    runs heart rate in the 30-40s in PACU   History of kidney stones    Hx gestational diabetes    Hx of Hashimoto thyroiditis    PONV (postoperative nausea and vomiting)    Past Surgical History:  Procedure Laterality Date   CESAREAN SECTION     CESAREAN SECTION  01/23/2013   Procedure: CESAREAN SECTION;  Surgeon: Turner Daniels, MD;  Location: [facility];  Service: Obstetrics;  Laterality: N/A;   CESAREAN SECTION N/A 10/11/2014   Procedure: CESAREAN SECTION;  Surgeon: Jeani Hawking, MD;  Location: [facility];  Service: Obstetrics;  Laterality: N/A;   CESAREAN SECTION N/A 10/24/2018   Procedure: REPEAT CESAREAN SECTION;  Surgeon: Marcelle Overlie, MD;  Location: [facility];  Service: Obstetrics;  Laterality: N/A;  Tracey RNFA   LITHOTRIPSY     PORTACATH PLACEMENT Left 11/25/2021   Procedure: PORT PLACEMENT;  Surgeon: Emelia Loron, MD;  Location: [facility] OR;  Service: General;  Laterality: Left;   WISDOM TOOTH EXTRACTION     Patient Active Problem List   Diagnosis Date Noted   Iron deficiency anemia 06/23/2023   Incisional hernia, without obstruction or gangrene 11/01/2022   Genetic testing 12/01/2021   Port-A-Cath in place 11/30/2021   Malignant neoplasm of upper-outer quadrant of right breast in female, estrogen receptor negative (HCC)  11/11/2021   Abnormal maternal glucose tolerance, antepartum 09/15/2018   S/P cesarean section 10/11/2014   TACHYCARDIA 05/01/2010    PCP: Nadyne Coombes, MD  REFERRING PROVIDER: Serena Croissant, MD  REFERRING DIAG: C50.411,Z17.1 (ICD-10-CM) - Malignant neoplasm of upper-outer quadrant of right breast in female, estrogen receptor negative (HCC)  THERAPY DIAG:  Malignant neoplasm of upper-inner quadrant of right breast in female, estrogen receptor negative (HCC)  Aftercare following surgery for neoplasm  Stiffness of right shoulder, not elsewhere classified  Localized edema  Abnormal posture  ONSET DATE: 06/18/22  Rationale for Evaluation and Treatment Rehabilitation  SUBJECTIVE  SUBJECTIVE STATEMENT: It seems to come and go, Its not really there right now.    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  Completed chemo 11/30/21-04/29/22. 06/18/22- R lumpectomy 0/5 lymph nodes. Completed radiation.  Seen here previously.  Hernia surgery across the belly 10/01/23.  All healed.    PAIN:  Are you having pain? No Pain is when I reach overhead it pulls all the way to the elbow.  Rt upper arm and axilla   PRECAUTIONS: Other: at risk for lymphedema on R side  WEIGHT BEARING RESTRICTIONS No  FALLS:  Has patient fallen in last 6 months?  LIVING ENVIRONMENT: Lives with: lives with their family husband and 4 kids Lives in: House/apartment Stairs: Yes; Internal: 14 steps; can reach both Has following equipment at home: None  OCCUPATION:    LEISURE: not much after abdominal surgery   HAND DOMINANCE : right   PRIOR LEVEL OF FUNCTION: Independent  PATIENT GOALS ; get rid of cording pull again   OBJECTIVE  COGNITION:  Overall cognitive status: Within functional limits for tasks  assessed   PALPATION: Cord palpated axilla/pectoralis border into medial upper arm around half way then disappeared  OBSERVATIONS / OTHER ASSESSMENTS: NA  POSTURE: forward head, rounded shoulders  UPPER EXTREMITY AROM/PROM:  A/PROM RIGHT   12/31/22  10/17/23  Shoulder extension 56 65- feels cording pull  Shoulder flexion 152 160- feels cording pull   Shoulder abduction 173 173 - feels cording pull   Shoulder internal rotation 63   Shoulder external rotation 106 95    (Blank rows = not tested)  LYMPHEDEMA ASSESSMENTS:  SURGERY TYPE/DATE: R lumpectomy and SLNB 06/18/22 NUMBER OF LYMPH NODES REMOVED: 5 - all negative CHEMOTHERAPY: completed  RADIATION:completed HORMONE TREATMENT: pt does not require INFECTIONS: none  LYMPHEDEMA ASSESSMENTS:   LANDMARK RIGHT  12/31/22  10 cm proximal to olecranon process 26.5  Olecranon process 24.5  10 cm proximal to ulnar styloid process 19.1  Just proximal to ulnar styloid process 14.7  Across hand at thumb web space 18.6  At base of 2nd digit 5.6  (Blank rows = not tested)  LANDMARK LEFT  12/31/22  10 cm proximal to olecranon process 26.2  Olecranon process 25  10 cm proximal to ulnar styloid process 19.3  Just proximal to ulnar styloid process 14.6  Across hand at thumb web space 18.5  At base of 2nd digit 5.5  (Blank rows = not tested)   QUICK DASH SURVEY: 16% on 12/31/22, 11% on 10/17/23  LDEX: 1.9: still in green  TODAY'S TREATMENT  11/03/23: STM, MFR of Rt upper medial arm and axilla without cording noted today but followed along typical path.  PROM of Rt shoulder into flexion, abduction, ER, D2 flexion.  Followed up with MLD to the same region to decrease inflammation and soreness.   10/17/23: measured for sleeve per pt request:  sigvaris secure 15-20 pink, size S2 which pt will self order - pt requested pink Updated HEP   PATIENT EDUCATION:  Education details: Per today's note Person educated: Patient Education method:  Explanation, Demonstration, Verbal cues, and Handouts Education comprehension: verbalized understanding  HOME EXERCISE PROGRAM: Access Code: [email or web address] URL: [email or web address] Date: 10/17/2023 Prepared by: Gwenevere Abbot  Exercises - Single Arm Doorway Pec Stretch at 120 Degrees Abduction  - 1-2 x daily - 7 x weekly - 1 sets - 3 reps - 20sec hold - Supine I's, Y's, T's  - 1-2 x daily - 7 x weekly - 1 sets - 5  reps - 3 second hold  ASSESSMENT:  CLINICAL IMPRESSION: Started MT today, but with no cording noted.  Pt reports it seems to be off and on lately which is an improvement.    OBJECTIVE IMPAIRMENTS decreased ROM, increased edema, increased fascial restrictions, increased muscle spasms, impaired UE functional use, and postural dysfunction.   ACTIVITY LIMITATIONS lifting and reach over head  PARTICIPATION LIMITATIONS:  none  PERSONAL FACTORS radiation hx, SLNB are also affecting patient's functional outcome.   REHAB POTENTIAL: Excellent  CLINICAL DECISION MAKING: Stable/uncomplicated  EVALUATION COMPLEXITY: Low  GOALS: Goals reviewed with patient? Yes  SHORT TERM GOALS=LONG TERM GOALS  Target date: 11/21/23  Pt will demonstrate no upper arm pull with R shoulder flexion to allow her to reach overhead.  Baseline:  Goal status: INITIAL  2.  Pt will demonstrate no upper arm pull with Rt shoulder abduction to allow her to reach up and out to the side.  Baseline:  Goal status: INITIAL   3.  Pt will be independent in a home exercise program for continued stretching and strengthening.  Baseline:  Goal status: INITIAL  PLAN: PT FREQUENCY: 1x per week   PT DURATION: 5 weeks   PLANNED INTERVENTIONS: Therapeutic exercises, Therapeutic activity, Patient/Family education, Self Care, Orthotic/Fit training, Manual lymph drainage, Compression bandaging, scar mobilization, Taping, Vasopneumatic device, and Manual therapy  PLAN FOR NEXT SESSION: PROM to R shoulder,  MFR to cording, wearing abdominal binder due to hernia surgery.     Idamae Lusher, PT 11/03/2023, 10:48 AM

## 2023-11-04 LAB — SIGNATERA ONLY (NATERA MANAGED)
SIGNATERA MTM READOUT: 0 MTM/ml
SIGNATERA TEST RESULT: NEGATIVE

## 2023-11-10 ENCOUNTER — Ambulatory Visit: Payer: Managed Care, Other (non HMO)

## 2023-11-10 DIAGNOSIS — C50211 Malignant neoplasm of upper-inner quadrant of right female breast: Secondary | ICD-10-CM | POA: Diagnosis not present

## 2023-11-10 DIAGNOSIS — Z483 Aftercare following surgery for neoplasm: Secondary | ICD-10-CM

## 2023-11-10 DIAGNOSIS — R600 Localized edema: Secondary | ICD-10-CM

## 2023-11-10 DIAGNOSIS — M25611 Stiffness of right shoulder, not elsewhere classified: Secondary | ICD-10-CM

## 2023-11-10 DIAGNOSIS — R293 Abnormal posture: Secondary | ICD-10-CM

## 2023-11-10 NOTE — Therapy (Signed)
OUTPATIENT PHYSICAL THERAPY ONCOLOGY TREATMENT  Patient Name: Sydney Rios MRN: [id number] DOB:December 15, 1981, 42 y.o., female Today's Date: 11/10/2023   PT End of Session - 11/10/23 1009     Visit Number 3    Number of Visits 5    Date for PT Re-Evaluation 11/21/23    PT Start Time 1005    PT Stop Time 1102    PT Time Calculation (min) 57 min    Activity Tolerance Patient tolerated treatment well    Behavior During Therapy WFL for tasks assessed/performed              Past Medical History:  Diagnosis Date   Anxiety    Cancer (HCC)    Right Breast CA   Chronic kidney disease    hx kidney stones   Complication of anesthesia    runs heart rate in the 30-40s in PACU   History of kidney stones    Hx gestational diabetes    Hx of Hashimoto thyroiditis    PONV (postoperative nausea and vomiting)    Past Surgical History:  Procedure Laterality Date   CESAREAN SECTION     CESAREAN SECTION  01/23/2013   Procedure: CESAREAN SECTION;  Surgeon: Turner Daniels, MD;  Location: [facility];  Service: Obstetrics;  Laterality: N/A;   CESAREAN SECTION N/A 10/11/2014   Procedure: CESAREAN SECTION;  Surgeon: Jeani Hawking, MD;  Location: [facility];  Service: Obstetrics;  Laterality: N/A;   CESAREAN SECTION N/A 10/24/2018   Procedure: REPEAT CESAREAN SECTION;  Surgeon: Marcelle Overlie, MD;  Location: [facility];  Service: Obstetrics;  Laterality: N/A;  Tracey RNFA   LITHOTRIPSY     PORTACATH PLACEMENT Left 11/25/2021   Procedure: PORT PLACEMENT;  Surgeon: Emelia Loron, MD;  Location: [facility] OR;  Service: General;  Laterality: Left;   WISDOM TOOTH EXTRACTION     Patient Active Problem List   Diagnosis Date Noted   Iron deficiency anemia 06/23/2023   Incisional hernia, without obstruction or gangrene 11/01/2022   Genetic testing 12/01/2021   Port-A-Cath in place 11/30/2021   Malignant neoplasm of upper-outer quadrant of right breast in female, estrogen receptor negative (HCC)  11/11/2021   Abnormal maternal glucose tolerance, antepartum 09/15/2018   S/P cesarean section 10/11/2014   TACHYCARDIA 05/01/2010    PCP: Nadyne Coombes, MD  REFERRING PROVIDER: Serena Croissant, MD  REFERRING DIAG: C50.411,Z17.1 (ICD-10-CM) - Malignant neoplasm of upper-outer quadrant of right breast in female, estrogen receptor negative (HCC)  THERAPY DIAG:  Malignant neoplasm of upper-inner quadrant of right breast in female, estrogen receptor negative (HCC)  Aftercare following surgery for neoplasm  Stiffness of right shoulder, not elsewhere classified  Localized edema  Abnormal posture  ONSET DATE: 06/18/22  Rationale for Evaluation and Treatment Rehabilitation  SUBJECTIVE  SUBJECTIVE STATEMENT: I am still tender in the upper arm but I haven't seen the cording as much as I was.   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  Completed chemo 11/30/21-04/29/22. 06/18/22- R lumpectomy 0/5 lymph nodes. Completed radiation.  Seen here previously.  Hernia surgery across the belly 10/01/23.  All healed.    PAIN:  Are you having pain? No Pain is when I reach overhead it pulls all the way to the elbow.  Rt upper arm and axilla   PRECAUTIONS: Other: at risk for lymphedema on R side  WEIGHT BEARING RESTRICTIONS No  FALLS:  Has patient fallen in last 6 months?  LIVING ENVIRONMENT: Lives with: lives with their family husband and 4 kids Lives in: House/apartment Stairs: Yes; Internal: 14 steps; can reach both Has following equipment at home: None  OCCUPATION:    LEISURE: not much after abdominal surgery   HAND DOMINANCE : right   PRIOR LEVEL OF FUNCTION: Independent  PATIENT GOALS ; get rid of cording pull again   OBJECTIVE  COGNITION:  Overall cognitive status: Within functional limits  for tasks assessed   PALPATION: Cord palpated axilla/pectoralis border into medial upper arm around half way then disappeared  OBSERVATIONS / OTHER ASSESSMENTS: NA  POSTURE: forward head, rounded shoulders  UPPER EXTREMITY AROM/PROM:  A/PROM RIGHT   12/31/22  10/17/23  Shoulder extension 56 65- feels cording pull  Shoulder flexion 152 160- feels cording pull   Shoulder abduction 173 173 - feels cording pull   Shoulder internal rotation 63   Shoulder external rotation 106 95    (Blank rows = not tested)  LYMPHEDEMA ASSESSMENTS:  SURGERY TYPE/DATE: R lumpectomy and SLNB 06/18/22 NUMBER OF LYMPH NODES REMOVED: 5 - all negative CHEMOTHERAPY: completed  RADIATION:completed HORMONE TREATMENT: pt does not require INFECTIONS: none  LYMPHEDEMA ASSESSMENTS:   LANDMARK RIGHT  12/31/22  10 cm proximal to olecranon process 26.5  Olecranon process 24.5  10 cm proximal to ulnar styloid process 19.1  Just proximal to ulnar styloid process 14.7  Across hand at thumb web space 18.6  At base of 2nd digit 5.6  (Blank rows = not tested)  LANDMARK LEFT  12/31/22  10 cm proximal to olecranon process 26.2  Olecranon process 25  10 cm proximal to ulnar styloid process 19.3  Just proximal to ulnar styloid process 14.6  Across hand at thumb web space 18.5  At base of 2nd digit 5.5  (Blank rows = not tested)   QUICK DASH SURVEY: 16% on 12/31/22, 11% on 10/17/23  LDEX: 1.9: still in green  TODAY'S TREATMENT  11/10/23: Manual Therapy  STM, MFR of Rt upper medial arm and axilla without cording noted today but followed along typical path.    PROM of Rt shoulder into flexion, abduction, D2 flexion with scapular depression by therapist throughout  11/03/23: STM, MFR of Rt upper medial arm and axilla without cording noted today but followed along typical path.  PROM of Rt shoulder into flexion, abduction, ER, D2 flexion.  Followed up with MLD to the same region to decrease inflammation and soreness.    10/17/23: measured for sleeve per pt request:  sigvaris secure 15-20 pink, size S2 which pt will self order - pt requested pink Updated HEP   PATIENT EDUCATION:  Education details: Per today's note Person educated: Patient Education method: Explanation, Demonstration, Verbal cues, and Handouts Education comprehension: verbalized understanding  HOME EXERCISE PROGRAM: Access Code: [email or web address] URL: [email or web address] Date: 10/17/2023 Prepared by: Gwenevere Abbot  Exercises -  Single Arm Doorway Pec Stretch at 120 Degrees Abduction  - 1-2 x daily - 7 x weekly - 1 sets - 3 reps - 20sec hold - Supine I's, Y's, T's  - 1-2 x daily - 7 x weekly - 1 sets - 5 reps - 3 second hold  ASSESSMENT:  CLINICAL IMPRESSION: Continued with manual therapy working to decrease Rt upper quadrant tightness at Rt lateral trunk and mildly at pect tendon. End P/ROM some limited at end ranges from tightness. This improved some by end of session. Pt reports her abdominal incision seems to be healing well overall.    OBJECTIVE IMPAIRMENTS decreased ROM, increased edema, increased fascial restrictions, increased muscle spasms, impaired UE functional use, and postural dysfunction.   ACTIVITY LIMITATIONS lifting and reach over head  PARTICIPATION LIMITATIONS:  none  PERSONAL FACTORS radiation hx, SLNB are also affecting patient's functional outcome.   REHAB POTENTIAL: Excellent  CLINICAL DECISION MAKING: Stable/uncomplicated  EVALUATION COMPLEXITY: Low  GOALS: Goals reviewed with patient? Yes  SHORT TERM GOALS=LONG TERM GOALS  Target date: 11/21/23  Pt will demonstrate no upper arm pull with R shoulder flexion to allow her to reach overhead.  Baseline:  Goal status: INITIAL  2.  Pt will demonstrate no upper arm pull with Rt shoulder abduction to allow her to reach up and out to the side.  Baseline:  Goal status: INITIAL   3.  Pt will be independent in a home exercise program for  continued stretching and strengthening.  Baseline:  Goal status: INITIAL  PLAN: PT FREQUENCY: 1x per week   PT DURATION: 5 weeks   PLANNED INTERVENTIONS: Therapeutic exercises, Therapeutic activity, Patient/Family education, Self Care, Orthotic/Fit training, Manual lymph drainage, Compression bandaging, scar mobilization, Taping, Vasopneumatic device, and Manual therapy  PLAN FOR NEXT SESSION: PROM to R shoulder, MFR to cording, wearing abdominal binder due to hernia surgery.     Hermenia Bers, PTA 11/10/2023, 11:15 AM

## 2023-11-11 ENCOUNTER — Telehealth: Payer: Self-pay

## 2023-11-11 NOTE — Telephone Encounter (Signed)
Attempted to call pt regarding negative signatera results lvm for pt to return call back. Pt knows to call back with any questions or concerns.

## 2023-11-17 ENCOUNTER — Ambulatory Visit: Payer: Managed Care, Other (non HMO)

## 2023-11-17 DIAGNOSIS — M25611 Stiffness of right shoulder, not elsewhere classified: Secondary | ICD-10-CM

## 2023-11-17 DIAGNOSIS — C50211 Malignant neoplasm of upper-inner quadrant of right female breast: Secondary | ICD-10-CM | POA: Diagnosis not present

## 2023-11-17 DIAGNOSIS — R293 Abnormal posture: Secondary | ICD-10-CM

## 2023-11-17 DIAGNOSIS — R600 Localized edema: Secondary | ICD-10-CM

## 2023-11-17 DIAGNOSIS — Z483 Aftercare following surgery for neoplasm: Secondary | ICD-10-CM

## 2023-11-17 DIAGNOSIS — Z171 Estrogen receptor negative status [ER-]: Secondary | ICD-10-CM

## 2023-11-17 NOTE — Therapy (Signed)
OUTPATIENT PHYSICAL THERAPY ONCOLOGY TREATMENT  Patient Name: Sydney Rios MRN: [id number] DOB:[birth year], 42 y.o., female Today's Date: 11/17/2023   PT End of Session - 11/17/23 1108     Visit Number 4    Number of Visits 5    Date for PT Re-Evaluation 11/21/23    PT Start Time 1105    PT Stop Time 1158    PT Time Calculation (min) 53 min    Activity Tolerance Patient tolerated treatment well    Behavior During Therapy WFL for tasks assessed/performed              Past Medical History:  Diagnosis Date   Anxiety    Cancer (HCC)    Right Breast CA   Chronic kidney disease    hx kidney stones   Complication of anesthesia    runs heart rate in the 30-40s in PACU   History of kidney stones    Hx gestational diabetes    Hx of Hashimoto thyroiditis    PONV (postoperative nausea and vomiting)    Past Surgical History:  Procedure Laterality Date   CESAREAN SECTION     CESAREAN SECTION  01/23/2013   Procedure: CESAREAN SECTION;  Surgeon: Turner Daniels, MD;  Location: [facility];  Service: Obstetrics;  Laterality: N/A;   CESAREAN SECTION N/A 10/11/2014   Procedure: CESAREAN SECTION;  Surgeon: Jeani Hawking, MD;  Location: [facility];  Service: Obstetrics;  Laterality: N/A;   CESAREAN SECTION N/A 10/24/2018   Procedure: REPEAT CESAREAN SECTION;  Surgeon: Marcelle Overlie, MD;  Location: [facility];  Service: Obstetrics;  Laterality: N/A;  Tracey RNFA   LITHOTRIPSY     PORTACATH PLACEMENT Left 11/25/2021   Procedure: PORT PLACEMENT;  Surgeon: Emelia Loron, MD;  Location: [facility] OR;  Service: General;  Laterality: Left;   WISDOM TOOTH EXTRACTION     Patient Active Problem List   Diagnosis Date Noted   Iron deficiency anemia 06/23/2023   Incisional hernia, without obstruction or gangrene 11/01/2022   Genetic testing 12/01/2021   Port-A-Cath in place 11/30/2021   Malignant neoplasm of upper-outer quadrant of right breast in female, estrogen receptor negative (HCC)  11/11/2021   Abnormal maternal glucose tolerance, antepartum 09/15/2018   S/P cesarean section 10/11/2014   TACHYCARDIA 05/01/2010    PCP: Nadyne Coombes, MD  REFERRING PROVIDER: Serena Croissant, MD  REFERRING DIAG: C50.411,Z17.1 (ICD-10-CM) - Malignant neoplasm of upper-outer quadrant of right breast in female, estrogen receptor negative (HCC)  THERAPY DIAG:  Malignant neoplasm of upper-inner quadrant of right breast in female, estrogen receptor negative (HCC)  Aftercare following surgery for neoplasm  Stiffness of right shoulder, not elsewhere classified  Localized edema  Abnormal posture  ONSET DATE: 06/18/22  Rationale for Evaluation and Treatment Rehabilitation  SUBJECTIVE  SUBJECTIVE STATEMENT: I noticed some increased tightness in my medial upper arm yesterday but it didn't limit my motion.   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  Completed chemo 11/30/21-04/29/22. 06/18/22- R lumpectomy 0/5 lymph nodes. Completed radiation.  Seen here previously.  Hernia surgery across the belly 10/01/23.  All healed.    PAIN:  Are you having pain? No Pain is when I reach overhead it pulls all the way to the elbow.  Rt upper arm and axilla   PRECAUTIONS: Other: at risk for lymphedema on R side  WEIGHT BEARING RESTRICTIONS No  FALLS:  Has patient fallen in last 6 months?  LIVING ENVIRONMENT: Lives with: lives with their family husband and 4 kids Lives in: House/apartment Stairs: Yes; Internal: 14 steps; can reach both Has following equipment at home: None  OCCUPATION:    LEISURE: not much after abdominal surgery   HAND DOMINANCE : right   PRIOR LEVEL OF FUNCTION: Independent  PATIENT GOALS ; get rid of cording pull again   OBJECTIVE  COGNITION:  Overall cognitive status: Within  functional limits for tasks assessed   PALPATION: Cord palpated axilla/pectoralis border into medial upper arm around half way then disappeared  OBSERVATIONS / OTHER ASSESSMENTS: NA  POSTURE: forward head, rounded shoulders  UPPER EXTREMITY AROM/PROM:  A/PROM RIGHT   12/31/22  10/17/23  Shoulder extension 56 65- feels cording pull  Shoulder flexion 152 160- feels cording pull   Shoulder abduction 173 173 - feels cording pull   Shoulder internal rotation 63   Shoulder external rotation 106 95    (Blank rows = not tested)  LYMPHEDEMA ASSESSMENTS:  SURGERY TYPE/DATE: R lumpectomy and SLNB 06/18/22 NUMBER OF LYMPH NODES REMOVED: 5 - all negative CHEMOTHERAPY: completed  RADIATION:completed HORMONE TREATMENT: pt does not require INFECTIONS: none  LYMPHEDEMA ASSESSMENTS:   LANDMARK RIGHT  12/31/22  10 cm proximal to olecranon process 26.5  Olecranon process 24.5  10 cm proximal to ulnar styloid process 19.1  Just proximal to ulnar styloid process 14.7  Across hand at thumb web space 18.6  At base of 2nd digit 5.6  (Blank rows = not tested)  LANDMARK LEFT  12/31/22  10 cm proximal to olecranon process 26.2  Olecranon process 25  10 cm proximal to ulnar styloid process 19.3  Just proximal to ulnar styloid process 14.6  Across hand at thumb web space 18.5  At base of 2nd digit 5.5  (Blank rows = not tested)   QUICK DASH SURVEY: 16% on 12/31/22, 11% on 10/17/23  LDEX: 1.9: still in green  TODAY'S TREATMENT  11/17/23: Manual Therapy  STM to Rt lateral border of scapula and pect tendon where tight   MFR of Rt upper medial arm and axilla with mild cording palpated at medial upper arm    PROM of Rt shoulder into flexion, abduction, D2 flexion with scapular depression by therapist throughout  11/10/23: Manual Therapy  STM, MFR of Rt upper medial arm and axilla without cording noted today but followed along typical path.    PROM of Rt shoulder into flexion, abduction, D2  flexion with scapular depression by therapist throughout  11/03/23: STM, MFR of Rt upper medial arm and axilla without cording noted today but followed along typical path.  PROM of Rt shoulder into flexion, abduction, ER, D2 flexion.  Followed up with MLD to the same region to decrease inflammation and soreness.   10/17/23: measured for sleeve per pt request:  sigvaris secure 15-20 pink, size S2 which pt  will self order - pt requested pink Updated HEP   PATIENT EDUCATION:  Education details: Per today's note Person educated: Patient Education method: Explanation, Demonstration, Verbal cues, and Handouts Education comprehension: verbalized understanding  HOME EXERCISE PROGRAM: Access Code: [email or web address] URL: [email or web address] Date: 10/17/2023 Prepared by: Gwenevere Abbot  Exercises - Single Arm Doorway Pec Stretch at 120 Degrees Abduction  - 1-2 x daily - 7 x weekly - 1 sets - 3 reps - 20sec hold - Supine I's, Y's, T's  - 1-2 x daily - 7 x weekly - 1 sets - 5 reps - 3 second hold  ASSESSMENT:  CLINICAL IMPRESSION: Pts end P/ROM continues to be slightly limited from fascial restrictions but this did seem some improved today. Mild cording was palpable at medial upper arm where pt reported feeling increased tightness yesterday. This also seemed to improve some by end of session. Pt will make one more visit in about 2 weeks in anticipation of that being her last session. She is going to cont to focus on end ROM stretching for net 2 weeks to see if she feels ready for D/C.  OBJECTIVE IMPAIRMENTS decreased ROM, increased edema, increased fascial restrictions, increased muscle spasms, impaired UE functional use, and postural dysfunction.   ACTIVITY LIMITATIONS lifting and reach over head  PARTICIPATION LIMITATIONS:  none  PERSONAL FACTORS radiation hx, SLNB are also affecting patient's functional outcome.   REHAB POTENTIAL: Excellent  CLINICAL DECISION MAKING:  Stable/uncomplicated  EVALUATION COMPLEXITY: Low  GOALS: Goals reviewed with patient? Yes  SHORT TERM GOALS=LONG TERM GOALS  Target date: 11/21/23  Pt will demonstrate no upper arm pull with R shoulder flexion to allow her to reach overhead.  Baseline:  Goal status: INITIAL  2.  Pt will demonstrate no upper arm pull with Rt shoulder abduction to allow her to reach up and out to the side.  Baseline:  Goal status: INITIAL   3.  Pt will be independent in a home exercise program for continued stretching and strengthening.  Baseline:  Goal status: INITIAL  PLAN: PT FREQUENCY: 1x per week   PT DURATION: 5 weeks   PLANNED INTERVENTIONS: Therapeutic exercises, Therapeutic activity, Patient/Family education, Self Care, Orthotic/Fit training, Manual lymph drainage, Compression bandaging, scar mobilization, Taping, Vasopneumatic device, and Manual therapy  PLAN FOR NEXT SESSION: Ready for D/C next session? PROM to R shoulder, MFR to cording, wearing abdominal binder due to hernia surgery.     Hermenia Bers, PTA 11/17/2023, 12:03 PM

## 2023-12-02 ENCOUNTER — Encounter: Payer: Self-pay | Admitting: Hematology and Oncology

## 2023-12-05 ENCOUNTER — Inpatient Hospital Stay: Payer: Managed Care, Other (non HMO)

## 2023-12-05 ENCOUNTER — Inpatient Hospital Stay: Payer: Managed Care, Other (non HMO) | Attending: Hematology and Oncology

## 2023-12-05 ENCOUNTER — Other Ambulatory Visit: Payer: Self-pay

## 2023-12-05 DIAGNOSIS — Z853 Personal history of malignant neoplasm of breast: Secondary | ICD-10-CM | POA: Insufficient documentation

## 2023-12-05 DIAGNOSIS — D500 Iron deficiency anemia secondary to blood loss (chronic): Secondary | ICD-10-CM | POA: Diagnosis present

## 2023-12-05 DIAGNOSIS — N920 Excessive and frequent menstruation with regular cycle: Secondary | ICD-10-CM | POA: Insufficient documentation

## 2023-12-05 DIAGNOSIS — D509 Iron deficiency anemia, unspecified: Secondary | ICD-10-CM

## 2023-12-05 DIAGNOSIS — Z08 Encounter for follow-up examination after completed treatment for malignant neoplasm: Secondary | ICD-10-CM | POA: Insufficient documentation

## 2023-12-05 LAB — CBC WITH DIFFERENTIAL (CANCER CENTER ONLY)
Abs Immature Granulocytes: 0.01 10*3/uL (ref 0.00–0.07)
Basophils Absolute: 0 10*3/uL (ref 0.0–0.1)
Basophils Relative: 1 %
Eosinophils Absolute: 0 10*3/uL (ref 0.0–0.5)
Eosinophils Relative: 1 %
HCT: 37.4 % (ref 36.0–46.0)
Hemoglobin: 12.4 g/dL (ref 12.0–15.0)
Immature Granulocytes: 0 %
Lymphocytes Relative: 23 %
Lymphs Abs: 0.7 10*3/uL (ref 0.7–4.0)
MCH: 31.9 pg (ref 26.0–34.0)
MCHC: 33.2 g/dL (ref 30.0–36.0)
MCV: 96.1 fL (ref 80.0–100.0)
Monocytes Absolute: 0.2 10*3/uL (ref 0.1–1.0)
Monocytes Relative: 8 %
Neutro Abs: 2.1 10*3/uL (ref 1.7–7.7)
Neutrophils Relative %: 67 %
Platelet Count: 235 10*3/uL (ref 150–400)
RBC: 3.89 MIL/uL (ref 3.87–5.11)
RDW: 13.9 % (ref 11.5–15.5)
WBC Count: 3.2 10*3/uL — ABNORMAL LOW (ref 4.0–10.5)
nRBC: 0 % (ref 0.0–0.2)

## 2023-12-05 LAB — IRON AND IRON BINDING CAPACITY (CC-WL,HP ONLY)
Iron: 150 ug/dL (ref 28–170)
Saturation Ratios: 45 % — ABNORMAL HIGH (ref 10.4–31.8)
TIBC: 335 ug/dL (ref 250–450)
UIBC: 185 ug/dL (ref 148–442)

## 2023-12-05 LAB — FERRITIN: Ferritin: 19 ng/mL (ref 11–307)

## 2023-12-06 ENCOUNTER — Ambulatory Visit: Payer: Managed Care, Other (non HMO) | Attending: Hematology and Oncology

## 2023-12-06 DIAGNOSIS — R293 Abnormal posture: Secondary | ICD-10-CM | POA: Diagnosis present

## 2023-12-06 DIAGNOSIS — Z483 Aftercare following surgery for neoplasm: Secondary | ICD-10-CM | POA: Diagnosis present

## 2023-12-06 DIAGNOSIS — R600 Localized edema: Secondary | ICD-10-CM | POA: Insufficient documentation

## 2023-12-06 DIAGNOSIS — M25611 Stiffness of right shoulder, not elsewhere classified: Secondary | ICD-10-CM | POA: Diagnosis present

## 2023-12-06 DIAGNOSIS — Z171 Estrogen receptor negative status [ER-]: Secondary | ICD-10-CM | POA: Insufficient documentation

## 2023-12-06 DIAGNOSIS — C50211 Malignant neoplasm of upper-inner quadrant of right female breast: Secondary | ICD-10-CM | POA: Insufficient documentation

## 2023-12-06 NOTE — Therapy (Addendum)
OUTPATIENT PHYSICAL THERAPY ONCOLOGY TREATMENT  Patient Name: IRIANA Rios MRN: [id number] DOB:May 04, 1981, 42 y.o., female Today's Date: 12/06/2023   PT End of Session - 12/06/23 1105     Visit Number 5    Number of Visits 5    Date for PT Re-Evaluation 11/21/23   D/C this visit   PT Start Time 1102    PT Stop Time 1202    PT Time Calculation (min) 60 min    Activity Tolerance Patient tolerated treatment well    Behavior During Therapy [facility] for tasks assessed/performed              Past Medical History:  Diagnosis Date   Anxiety    Cancer (HCC)    Right Breast CA   Chronic kidney disease    hx kidney stones   Complication of anesthesia    runs heart rate in the 30-40s in PACU   History of kidney stones    Hx gestational diabetes    Hx of Hashimoto thyroiditis    PONV (postoperative nausea and vomiting)    Past Surgical History:  Procedure Laterality Date   CESAREAN SECTION     CESAREAN SECTION  01/23/2013   Procedure: CESAREAN SECTION;  Surgeon: Turner Daniels, MD;  Location: [facility];  Service: Obstetrics;  Laterality: N/A;   CESAREAN SECTION N/A 10/11/2014   Procedure: CESAREAN SECTION;  Surgeon: Jeani Hawking, MD;  Location: [facility];  Service: Obstetrics;  Laterality: N/A;   CESAREAN SECTION N/A 10/24/2018   Procedure: REPEAT CESAREAN SECTION;  Surgeon: Marcelle Overlie, MD;  Location: [facility] SU[facility];  Service: Obstetrics;  Laterality: N/A;  Tracey RNFA   LITHOTRIPSY     PORTACATH PLACEMENT Left 11/25/2021   Procedure: PORT PLACEMENT;  Surgeon: Emelia Loron, MD;  Location: [facility];  Service: General;  Laterality: Left;   WISDOM TOOTH EXTRACTION     Patient Active Problem List   Diagnosis Date Noted   Iron deficiency anemia 06/23/2023   Incisional hernia, without obstruction or gangrene 11/01/2022   Genetic testing 12/01/2021   Port-A-Cath in place 11/30/2021   Malignant neoplasm of upper-outer quadrant of right breast in female, estrogen receptor  negative (HCC) 11/11/2021   Abnormal maternal glucose tolerance, antepartum 09/15/2018   S/P cesarean section 10/11/2014   TACHYCARDIA 05/01/2010    PCP: Nadyne Coombes, MD  REFERRING PROVIDER: Serena Croissant, MD  REFERRING DIAG: C50.411,Z17.1 (ICD-10-CM) - Malignant neoplasm of upper-outer quadrant of right breast in female, estrogen receptor negative (HCC)  THERAPY DIAG:  Malignant neoplasm of upper-inner quadrant of right breast in female, estrogen receptor negative (HCC)  Aftercare following surgery for neoplasm  Stiffness of right shoulder, not elsewhere classified  Localized edema  Abnormal posture  ONSET DATE: 06/18/22  Rationale for Evaluation and Treatment Rehabilitation  SUBJECTIVE  SUBJECTIVE STATEMENT: Overall the tightness is much better. It was a little tight Sunday in the back of my arm but I stretched and it felt better. I still get tenderness at the side of my breast sometimes but it comes and goes and I think it's just from the radiation. I'm ready to D/C today.   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  Completed chemo 11/30/21-04/29/22. 06/18/22- R lumpectomy 0/5 lymph nodes. Completed radiation.  Seen here previously.  Hernia surgery across the belly 10/01/23.  All healed.    PAIN:  Are you having pain? No, very slight pull when reaching OH in the back of the elbow but better. "It just still doesn't feel like my Lt arm."    PRECAUTIONS: Other: at risk for lymphedema on R side  WEIGHT BEARING RESTRICTIONS No  FALLS:  Has patient fallen in last 6 months?  LIVING ENVIRONMENT: Lives with: lives with their family husband and 4 kids Lives in: House/apartment Stairs: Yes; Internal: 14 steps; can reach both Has following equipment at home: None  OCCUPATION:    LEISURE: not  much after abdominal surgery   HAND DOMINANCE : right   PRIOR LEVEL OF FUNCTION: Independent  PATIENT GOALS ; get rid of cording pull again   OBJECTIVE  COGNITION:  Overall cognitive status: Within functional limits for tasks assessed   PALPATION: Cord palpated axilla/pectoralis border into medial upper arm around half way then disappeared  OBSERVATIONS / OTHER ASSESSMENTS: NA  POSTURE: forward head, rounded shoulders  UPPER EXTREMITY AROM/PROM:  A/PROM RIGHT   12/31/22  10/17/23  Shoulder extension 56 65- feels cording pull  Shoulder flexion 152 160- feels cording pull   Shoulder abduction 173 173 - feels cording pull   Shoulder internal rotation 63   Shoulder external rotation 106 95    (Blank rows = not tested)  LYMPHEDEMA ASSESSMENTS:  SURGERY TYPE/DATE: R lumpectomy and SLNB 06/18/22 NUMBER OF LYMPH NODES REMOVED: 5 - all negative CHEMOTHERAPY: completed  RADIATION:completed HORMONE TREATMENT: pt does not require INFECTIONS: none  LYMPHEDEMA ASSESSMENTS:   LANDMARK RIGHT  12/31/22  10 cm proximal to olecranon process 26.5  Olecranon process 24.5  10 cm proximal to ulnar styloid process 19.1  Just proximal to ulnar styloid process 14.7  Across hand at thumb web space 18.6  At base of 2nd digit 5.6  (Blank rows = not tested)  LANDMARK LEFT  12/31/22  10 cm proximal to olecranon process 26.2  Olecranon process 25  10 cm proximal to ulnar styloid process 19.3  Just proximal to ulnar styloid process 14.6  Across hand at thumb web space 18.5  At base of 2nd digit 5.5  (Blank rows = not tested)   QUICK DASH SURVEY: 16% on 12/31/22, 11% on 10/17/23  LDEX: 1.9: still in green  TODAY'S TREATMENT  12/06/23: Manual Therapy  STM to Rt lateral border of scapula and pect tendon where tight, but less so today  MFR of Rt upper medial arm and axilla with no cording palpated today  PROM of Rt shoulder into flexion, abduction, D2 flexion with scapular depression by  therapist throughout  Scap Mobs in Lt S/L to Rt scapula into protraction and retraction, pt with limited mobility that improved some by end of session  11/17/23: Manual Therapy  STM to Rt lateral border of scapula and pect tendon where tight   MFR of Rt upper medial arm and axilla with mild cording palpated at medial upper arm    PROM of Rt  shoulder into flexion, abduction, D2 flexion with scapular depression by therapist throughout  11/10/23: Manual Therapy  STM, MFR of Rt upper medial arm and axilla without cording noted today but followed along typical path.    PROM of Rt shoulder into flexion, abduction, D2 flexion with scapular depression by therapist throughout    PATIENT EDUCATION:  Education details: Per today's note Person educated: Patient Education method: Explanation, Demonstration, Verbal cues, and Handouts Education comprehension: verbalized understanding  HOME EXERCISE PROGRAM: Access Code: [email or web address] URL: [email or web address] Date: 10/17/2023 Prepared by: Gwenevere Abbot  Exercises - Single Arm Doorway Pec Stretch at 120 Degrees Abduction  - 1-2 x daily - 7 x weekly - 1 sets - 3 reps - 20sec hold - Supine I's, Y's, T's  - 1-2 x daily - 7 x weekly - 1 sets - 5 reps - 3 second hold  ASSESSMENT:  CLINICAL IMPRESSION: Pt has made good progress this episode of care and feels ready for D/C at this time. She has met goal of feeling no pull with OH flex and independent with HEP. She has partially met goal of feeling pull with OH abd reporting pull still present but feels improved since start of care.   OBJECTIVE IMPAIRMENTS decreased ROM, increased edema, increased fascial restrictions, increased muscle spasms, impaired UE functional use, and postural dysfunction.   ACTIVITY LIMITATIONS lifting and reach over head  PARTICIPATION LIMITATIONS:  none  PERSONAL FACTORS radiation hx, SLNB are also affecting patient's functional outcome.   REHAB POTENTIAL:  Excellent  CLINICAL DECISION MAKING: Stable/uncomplicated  EVALUATION COMPLEXITY: Low  GOALS: Goals reviewed with patient? Yes  SHORT TERM GOALS=LONG TERM GOALS  Target date: 11/21/23  Pt will demonstrate no upper arm pull with R shoulder flexion to allow her to reach overhead.  Baseline:  Goal status: MET  2.  Pt will demonstrate no upper arm pull with Rt shoulder abduction to allow her to reach up and out to the side.  Baseline:  Goal status: PARTIALLY MET   3.  Pt will be independent in a home exercise program for continued stretching and strengthening.  Baseline:  Goal status: MET  PLAN: PT FREQUENCY: 1x per week   PT DURATION: 5 weeks   PLANNED INTERVENTIONS: Therapeutic exercises, Therapeutic activity, Patient/Family education, Self Care, Orthotic/Fit training, Manual lymph drainage, Compression bandaging, scar mobilization, Taping, Vasopneumatic device, and Manual therapy  PLAN FOR NEXT SESSION: D/C this session.    Hermenia Bers, PTA 12/06/2023, 12:06 PM   PHYSICAL THERAPY DISCHARGE SUMMARY  Visits from Start of Care: 5  Current functional level related to goals / functional outcomes: See above   Remaining deficits: See above    Education / Equipment: Final HEP  Plan: Patient agrees to discharge.   Patient is being discharged due to meeting the stated rehab goals.     Gwenevere Abbot, PT

## 2023-12-08 ENCOUNTER — Inpatient Hospital Stay (HOSPITAL_BASED_OUTPATIENT_CLINIC_OR_DEPARTMENT_OTHER): Payer: Managed Care, Other (non HMO) | Admitting: Hematology and Oncology

## 2023-12-08 DIAGNOSIS — Z171 Estrogen receptor negative status [ER-]: Secondary | ICD-10-CM | POA: Diagnosis not present

## 2023-12-08 DIAGNOSIS — C50411 Malignant neoplasm of upper-outer quadrant of right female breast: Secondary | ICD-10-CM | POA: Diagnosis not present

## 2023-12-08 DIAGNOSIS — D509 Iron deficiency anemia, unspecified: Secondary | ICD-10-CM

## 2023-12-08 NOTE — Progress Notes (Signed)
HEMATOLOGY-ONCOLOGY TELEPHONE VISIT PROGRESS NOTE  I connected with our patient on 12/08/23 at  9:45 AM EST by telephone and verified that I am speaking with the correct person using two identifiers.  I discussed the limitations, risks, security and privacy concerns of performing an evaluation and management service by telephone and the availability of in person appointments.  I also discussed with the patient that there may be a patient responsible charge related to this service. The patient expressed understanding and agreed to proceed.   History of Present Illness:    History of Present Illness   The patient, with a history of breast cancer and iron deficiency anemia secondary to heavy menstrual cycles, presents for follow-up after receiving an iron infusion in July. She reports ongoing heavy menstrual cycles and has discussed the possibility of an ablation with her OB/GYN, but is hesitant to proceed with the procedure at this time. She has noticed fluctuations in her white blood cell count, but understands this to be a normal occurrence for her. She is also on a regimen of Signatera, which she is considering adjusting from every three months to every six months.        Oncology History  Malignant neoplasm of upper-outer quadrant of right breast in female, estrogen receptor negative (HCC)  11/04/2021 Initial Diagnosis    Patient has been breast-feeding.  Felt a painful right breast mass and an axillary mass.  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 with a Ki-67 of 40%, 1 abnormal lymph node 5.8 cm biopsy: Grade 3 IDC ER 5%, PR 0%, HER2 negative, Ki-67 40%   11/11/2021 Cancer Staging   Staging form: Breast, AJCC 8th Edition - Clinical stage from 11/11/2021: Stage IIIA (cT3, cN1, cM0, G3, ER+, PR+, HER2-) - Signed by Serena Croissant, MD on 02/18/2022 Stage prefix: Initial diagnosis Histologic grading system: 3 grade system   11/30/2021 - 04/29/2022  Chemotherapy   Patient is on Treatment Plan : BREAST Pembrolizumab + Carboplatin D1 + Paclitaxel D1,8,15 q21d X 4 cycles / Pembrolizumab + AC q21d x 4 cycles     11/30/2021 Genetic Testing   Negative genetic testing on the CancerNext-Expanded+RNA insight panel.  The report date is 11/30/2021  The CancerNext-Expanded gene panel offered by [facility] and includes sequencing and rearrangement analysis for the following 77 genes: AIP, ALK, APC*, ATM*, AXIN2, BAP1, BARD1, BLM, BMPR1A, BRCA1*, BRCA2*, BRIP1*, CDC73, CDH1*, CDK4, CDKN1B, CDKN2A, CHEK2*, CTNNA1, DICER1, FANCC, FH, FLCN, GALNT12, KIF1B, LZTR1, MAX, MEN1, MET, MLH1*, MSH2*, MSH3, MSH6*, MUTYH*, NBN, NF1*, NF2, NTHL1, PALB2*, PHOX2B, PMS2*, POT1, PRKAR1A, PTCH1, PTEN*, RAD51C*, RAD51D*, RB1, RECQL, RET, SDHA, SDHAF2, SDHB, SDHC, SDHD, SMAD4, SMARCA4, SMARCB1, SMARCE1, STK11, SUFU, TMEM127, TP53*, TSC1, TSC2, VHL and XRCC2 (sequencing and deletion/duplication); EGFR, EGLN1, HOXB13, KIT, MITF, PDGFRA, POLD1, and POLE (sequencing only); EPCAM and GREM1 (deletion/duplication only). DNA and RNA analyses performed for * genes.    05/18/2022 - 08/19/2022 Chemotherapy   Patient is on Treatment Plan : BREAST Pembrolizumab (200) q21d x 27 weeks     07/27/2022 - 09/10/2022 Radiation Therapy   50.4 Gy in 28 treatments "Boost": 6 Gy in 3 treatments     REVIEW OF SYSTEMS:   Constitutional: Denies fevers, chills or abnormal weight loss All other systems were reviewed with the patient and are negative.  Observations/Objective:     Assessment Plan:  Malignant neoplasm of upper-outer quadrant of right breast in female, estrogen receptor negative (HCC) 11/04/2021: Patient has been breast-feeding.  Felt a painful right breast mass and an axillary mass.  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 with a Ki-67 of 40%, 1 abnormal lymph node 5.8 cm biopsy: Grade 3 IDC ER 5%, PR 0%, HER2 negative, Ki-67 40%   CT CAP  11/20/2021: Right breast cancer with axillary lymph nodes, mildly enlarged right retropectoral lymph nodes, right internal mammary lymph nodes Breast MRI 11/17/2021: Known breast cancer 2 cm, right axillary lymph node, non-mass enhancement 7 cm, borderline enlarged right internal mammary lymph nodes   Treatment Plan: based on multidisciplinary tumor board: 1. Neoadjuvant chemotherapy with Taxol weekly 12 with Keytruda and carboplatin every 3 weeks x4 Adriamycin and Cytoxan, Keytruda dose dense 4 followed by  followed by Rande Lawman maintenance for 1 year completed 08/19/2022 2. 06/18/2022:Right lumpectomy (at [facility]): No residual invasive or in situ carcinoma.  0/5 lymph nodes 3. Followed by adjuvant radiation therapy completed September 2023.  ------------------------------------------------------------------------------------------------------ Breast cancer surveillance: Breast exam 06/22/2023: Benign Mammogram 05/10/2023: Benign Signatera May 2024: Negative  Iron deficiency anemia Cause: Heavy menstrual cycles colonoscopy October 2023 normal IV iron: July 2024 She is seeing GYN and they are contemplating on ablation but she wants to hold off on doing it at this time. She will start taking oral iron supplement 1 a day.  Lab review: 12/05/2023: Hemoglobin 12.4, iron saturation 45%, ferritin 19  Labs and follow-up in 3 months  I discussed the assessment and treatment plan with the patient. The patient was provided an opportunity to ask questions and all were answered. The patient agreed with the plan and demonstrated an understanding of the instructions. The patient was advised to call back or seek an in-person evaluation if the symptoms worsen or if the condition fails to improve as anticipated.   I provided 12 minutes of non-face-to-face time during this encounter.  This includes time for charting and coordination of care   Tamsen Meek, MD

## 2023-12-08 NOTE — Assessment & Plan Note (Signed)
11/04/2021: Patient has been breast-feeding.  Felt a painful right breast mass and an axillary mass.  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 with a Ki-67 of 40%, 1 abnormal lymph node 5.8 cm biopsy: Grade 3 IDC ER 5%, PR 0%, HER2 negative, Ki-67 40%   CT CAP 11/20/2021: Right breast cancer with axillary lymph nodes, mildly enlarged right retropectoral lymph nodes, right internal mammary lymph nodes Breast MRI 11/17/2021: Known breast cancer 2 cm, right axillary lymph node, non-mass enhancement 7 cm, borderline enlarged right internal mammary lymph nodes   Treatment Plan: based on multidisciplinary tumor board: 1. Neoadjuvant chemotherapy with Taxol weekly 12 with Keytruda and carboplatin every 3 weeks x4 Adriamycin and Cytoxan, Keytruda dose dense 4 followed by  followed by Rande Lawman maintenance for 1 year completed 08/19/2022 2. 06/18/2022:Right lumpectomy (at [facility]): No residual invasive or in situ carcinoma.  0/5 lymph nodes 3. Followed by adjuvant radiation therapy completed September 2023.  ------------------------------------------------------------------------------------------------------ Breast cancer surveillance: Breast exam 06/22/2023: Benign Mammogram 05/10/2023: Benign Signatera May 2024: Negative

## 2023-12-08 NOTE — Assessment & Plan Note (Signed)
Cause: Heavy menstrual cycles colonoscopy October 2023 normal IV iron: July 2024  Lab review: 12/05/2023: Hemoglobin 12.4, iron saturation 45%, ferritin 19  Labs and follow-up in 3 months

## 2023-12-26 ENCOUNTER — Encounter: Payer: Self-pay | Admitting: Hematology and Oncology

## 2024-01-05 ENCOUNTER — Encounter: Payer: Self-pay | Admitting: Hematology and Oncology

## 2024-02-15 LAB — SIGNATERA
SIGNATERA MTM READOUT: 0 MTM/ml
SIGNATERA TEST RESULT: NEGATIVE

## 2024-03-02 ENCOUNTER — Other Ambulatory Visit: Payer: Self-pay | Admitting: *Deleted

## 2024-03-02 DIAGNOSIS — D509 Iron deficiency anemia, unspecified: Secondary | ICD-10-CM

## 2024-03-05 ENCOUNTER — Inpatient Hospital Stay: Payer: Managed Care, Other (non HMO) | Attending: Hematology and Oncology

## 2024-03-05 DIAGNOSIS — D500 Iron deficiency anemia secondary to blood loss (chronic): Secondary | ICD-10-CM | POA: Insufficient documentation

## 2024-03-05 DIAGNOSIS — Z08 Encounter for follow-up examination after completed treatment for malignant neoplasm: Secondary | ICD-10-CM | POA: Insufficient documentation

## 2024-03-05 DIAGNOSIS — Z853 Personal history of malignant neoplasm of breast: Secondary | ICD-10-CM | POA: Insufficient documentation

## 2024-03-05 DIAGNOSIS — N920 Excessive and frequent menstruation with regular cycle: Secondary | ICD-10-CM | POA: Diagnosis present

## 2024-03-05 DIAGNOSIS — D509 Iron deficiency anemia, unspecified: Secondary | ICD-10-CM

## 2024-03-05 LAB — CBC WITH DIFFERENTIAL (CANCER CENTER ONLY)
Abs Immature Granulocytes: 0.01 10*3/uL (ref 0.00–0.07)
Basophils Absolute: 0 10*3/uL (ref 0.0–0.1)
Basophils Relative: 0 %
Eosinophils Absolute: 0 10*3/uL (ref 0.0–0.5)
Eosinophils Relative: 1 %
HCT: 38 % (ref 36.0–46.0)
Hemoglobin: 12.8 g/dL (ref 12.0–15.0)
Immature Granulocytes: 0 %
Lymphocytes Relative: 21 %
Lymphs Abs: 0.7 10*3/uL (ref 0.7–4.0)
MCH: 31.5 pg (ref 26.0–34.0)
MCHC: 33.7 g/dL (ref 30.0–36.0)
MCV: 93.6 fL (ref 80.0–100.0)
Monocytes Absolute: 0.3 10*3/uL (ref 0.1–1.0)
Monocytes Relative: 9 %
Neutro Abs: 2.2 10*3/uL (ref 1.7–7.7)
Neutrophils Relative %: 69 %
Platelet Count: 208 10*3/uL (ref 150–400)
RBC: 4.06 MIL/uL (ref 3.87–5.11)
RDW: 13.3 % (ref 11.5–15.5)
WBC Count: 3.3 10*3/uL — ABNORMAL LOW (ref 4.0–10.5)
nRBC: 0 % (ref 0.0–0.2)

## 2024-03-05 LAB — IRON AND IRON BINDING CAPACITY (CC-WL,HP ONLY)
Iron: 157 ug/dL (ref 28–170)
Saturation Ratios: 43 % — ABNORMAL HIGH (ref 10.4–31.8)
TIBC: 368 ug/dL (ref 250–450)
UIBC: 211 ug/dL (ref 148–442)

## 2024-03-05 LAB — FERRITIN: Ferritin: 12 ng/mL (ref 11–307)

## 2024-03-07 NOTE — Assessment & Plan Note (Signed)
 Cause: Heavy menstrual cycles colonoscopy October 2023 normal IV iron: July 2024 She is seeing GYN and they are contemplating on ablation but she wants to hold off on doing it at this time. She will start taking oral iron supplement 1 a day.   Lab review: 12/05/2023: Hemoglobin 12.4, iron saturation 45%, ferritin 19 03/05/2024: Hemoglobin 12.8, MCV 93.6, iron saturation 43%, ferritin 12  No role of IV iron therapy   Labs and follow-up in 6 months

## 2024-03-07 NOTE — Assessment & Plan Note (Signed)
11/04/2021: Patient has been breast-feeding.  Felt a painful right breast mass and an axillary mass.  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 with a Ki-67 of 40%, 1 abnormal lymph node 5.8 cm biopsy: Grade 3 IDC ER 5%, PR 0%, HER2 negative, Ki-67 40%   CT CAP 11/20/2021: Right breast cancer with axillary lymph nodes, mildly enlarged right retropectoral lymph nodes, right internal mammary lymph nodes Breast MRI 11/17/2021: Known breast cancer 2 cm, right axillary lymph node, non-mass enhancement 7 cm, borderline enlarged right internal mammary lymph nodes   Treatment Plan: based on multidisciplinary tumor board: 1. Neoadjuvant chemotherapy with Taxol weekly 12 with Keytruda and carboplatin every 3 weeks x4 Adriamycin and Cytoxan, Keytruda dose dense 4 followed by  followed by Rande Lawman maintenance for 1 year completed 08/19/2022 2. 06/18/2022:Right lumpectomy (at [facility]): No residual invasive or in situ carcinoma.  0/5 lymph nodes 3. Followed by adjuvant radiation therapy completed September 2023.  ------------------------------------------------------------------------------------------------------ Breast cancer surveillance: Breast exam 06/22/2023: Benign Mammogram 05/10/2023: Benign Signatera May 2024: Negative

## 2024-03-08 ENCOUNTER — Inpatient Hospital Stay (HOSPITAL_BASED_OUTPATIENT_CLINIC_OR_DEPARTMENT_OTHER): Payer: Managed Care, Other (non HMO) | Admitting: Hematology and Oncology

## 2024-03-08 DIAGNOSIS — Z171 Estrogen receptor negative status [ER-]: Secondary | ICD-10-CM

## 2024-03-08 DIAGNOSIS — C50411 Malignant neoplasm of upper-outer quadrant of right female breast: Secondary | ICD-10-CM | POA: Diagnosis not present

## 2024-03-08 DIAGNOSIS — D509 Iron deficiency anemia, unspecified: Secondary | ICD-10-CM | POA: Diagnosis not present

## 2024-03-08 NOTE — Progress Notes (Signed)
 HEMATOLOGY-ONCOLOGY TELEPHONE VISIT PROGRESS NOTE  I connected with our patient on 03/08/24 at 11:45 AM EDT by telephone and verified that I am speaking with the correct person using two identifiers.  I discussed the limitations, risks, security and privacy concerns of performing an evaluation and management service by telephone and the availability of in person appointments.  I also discussed with the patient that there may be a patient responsible charge related to this service. The patient expressed understanding and agreed to proceed.   History of Present Illness: Follow-up of breast cancer and iron deficiency anemia  History of Present Illness The patient, with a history of heavy menstrual bleeding, reports that her cycles have become shorter, with heavy bleeding typically confined to one day. She has discussed the possibility of ablation, but has decided against it at this time. She is not currently taking an iron supplement, and her iron levels appear to be stable. She has not required an iron infusion since July of the previous year.  The patient also reports persistent pain on the right side, which she attributes to previous radiation treatment. The pain is intermittent and does not cause significant concern.  She also mentions an upcoming mammogram, scheduled for May, and expresses a preference for ultrasound due to discomfort with mammograms. However, she understands the limitations of ultrasound for comprehensive breast screening.    Oncology History  Malignant neoplasm of upper-outer quadrant of right breast in female, estrogen receptor negative (HCC)  11/04/2021 Initial Diagnosis    Patient has been breast-feeding.  Felt a painful right breast mass and an axillary mass.  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 with a Ki-67 of 40%, 1 abnormal lymph node 5.8 cm biopsy: Grade 3 IDC ER 5%, PR 0%, HER2 negative, Ki-67 40%   11/11/2021 Cancer  Staging   Staging form: Breast, AJCC 8th Edition - Clinical stage from 11/11/2021: Stage IIIA (cT3, cN1, cM0, G3, ER+, PR+, HER2-) - Signed by Serena Croissant, MD on 02/18/2022 Stage prefix: Initial diagnosis Histologic grading system: 3 grade system   11/30/2021 - 04/29/2022 Chemotherapy   Patient is on Treatment Plan : BREAST Pembrolizumab + Carboplatin D1 + Paclitaxel D1,8,15 q21d X 4 cycles / Pembrolizumab + AC q21d x 4 cycles     11/30/2021 Genetic Testing   Negative genetic testing on the CancerNext-Expanded+RNA insight panel.  The report date is 11/30/2021  The CancerNext-Expanded gene panel offered by [facility] and includes sequencing and rearrangement analysis for the following 77 genes: AIP, ALK, APC*, ATM*, AXIN2, BAP1, BARD1, BLM, BMPR1A, BRCA1*, BRCA2*, BRIP1*, CDC73, CDH1*, CDK4, CDKN1B, CDKN2A, CHEK2*, CTNNA1, DICER1, FANCC, FH, FLCN, GALNT12, KIF1B, LZTR1, MAX, MEN1, MET, MLH1*, MSH2*, MSH3, MSH6*, MUTYH*, NBN, NF1*, NF2, NTHL1, PALB2*, PHOX2B, PMS2*, POT1, PRKAR1A, PTCH1, PTEN*, RAD51C*, RAD51D*, RB1, RECQL, RET, SDHA, SDHAF2, SDHB, SDHC, SDHD, SMAD4, SMARCA4, SMARCB1, SMARCE1, STK11, SUFU, TMEM127, TP53*, TSC1, TSC2, VHL and XRCC2 (sequencing and deletion/duplication); EGFR, EGLN1, HOXB13, KIT, MITF, PDGFRA, POLD1, and POLE (sequencing only); EPCAM and GREM1 (deletion/duplication only). DNA and RNA analyses performed for * genes.    05/18/2022 - 08/19/2022 Chemotherapy   Patient is on Treatment Plan : BREAST Pembrolizumab (200) q21d x 27 weeks     07/27/2022 - 09/10/2022 Radiation Therapy   50.4 Gy in 28 treatments "Boost": 6 Gy in 3 treatments     REVIEW OF SYSTEMS:   Constitutional: Denies fevers, chills or abnormal weight loss All other systems were reviewed with the patient  and are negative. Observations/Objective:     Assessment Plan:  Malignant neoplasm of upper-outer quadrant of right breast in female, estrogen receptor negative (HCC) 11/04/2021: Patient has been  breast-feeding.  Felt a painful right breast mass and an axillary mass.  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 with a Ki-67 of 40%, 1 abnormal lymph node 5.8 cm biopsy: Grade 3 IDC ER 5%, PR 0%, HER2 negative, Ki-67 40%   CT CAP 11/20/2021: Right breast cancer with axillary lymph nodes, mildly enlarged right retropectoral lymph nodes, right internal mammary lymph nodes Breast MRI 11/17/2021: Known breast cancer 2 cm, right axillary lymph node, non-mass enhancement 7 cm, borderline enlarged right internal mammary lymph nodes   Treatment Plan: based on multidisciplinary tumor board: 1. Neoadjuvant chemotherapy with Taxol weekly 12 with Keytruda and carboplatin every 3 weeks x4 Adriamycin and Cytoxan, Keytruda dose dense 4 followed by  followed by Rande Lawman maintenance for 1 year completed 08/19/2022 2. 06/18/2022:Right lumpectomy (at [facility]): No residual invasive or in situ carcinoma.  0/5 lymph nodes 3. Followed by adjuvant radiation therapy completed September 2023.  ------------------------------------------------------------------------------------------------------ Breast cancer surveillance: Breast exam 06/22/2023: Benign Mammogram 05/10/2023: Benign at [facility].  She would like to move mammograms back to breast center because her surgeon left the practice. Signatera May 2024: Negative  Iron deficiency anemia Cause: Heavy menstrual cycles colonoscopy October 2023 normal (cycles have lightened up) IV iron: July 2024   Lab review: 12/05/2023: Hemoglobin 12.4, iron saturation 45%, ferritin 19 03/05/2024: Hemoglobin 12.8, MCV 93.6, iron saturation 43%, ferritin 12  No role of IV iron therapy   Labs and follow-up in 6 months --------------------------------- Assessment and Plan Assessment & Plan Malignant neoplasm of upper-outer quadrant of right breast Intermittent post-radiation pain noted, not concerning. Breast density  improved from D to C. Transitioning from diagnostic to screening mammograms after two years. - Order diagnostic mammogram in May. - Discuss transition to screening mammograms. - Continue monitoring with Signatera blood tests.  Iron deficiency anemia Iron levels stable, no infusion needed since July. Iron saturation and ferritin levels adequate. No supplements required. Discussed risks of supplementation. - Monitor iron levels every six months. - Schedule blood draw in September. - Advise to contact if symptoms change.      I discussed the assessment and treatment plan with the patient. The patient was provided an opportunity to ask questions and all were answered. The patient agreed with the plan and demonstrated an understanding of the instructions. The patient was advised to call back or seek an in-person evaluation if the symptoms worsen or if the condition fails to improve as anticipated.   I provided 20 minutes of non-face-to-face time during this encounter.  This includes time for charting and coordination of care   Tamsen Meek, MD

## 2024-03-09 ENCOUNTER — Telehealth: Payer: Self-pay | Admitting: Hematology and Oncology

## 2024-03-09 NOTE — Telephone Encounter (Signed)
 Scheduled appointments per 3/13 los. Left VM with appointment details.

## 2024-05-04 LAB — SIGNATERA

## 2024-05-25 ENCOUNTER — Telehealth: Payer: Self-pay

## 2024-05-25 NOTE — Telephone Encounter (Signed)
Called pt per MD to advise [employer] testing was negative/not detected. Pt verbalized understanding of results and knows [employer] will be in touch to schedule 3 mo repeat lab.   

## 2024-05-29 ENCOUNTER — Encounter: Payer: Self-pay | Admitting: Hematology and Oncology

## 2024-06-06 NOTE — Progress Notes (Signed)
 [facility] Cancer Follow up:    Sydney Cea, MD [address] [place] [address]   DIAGNOSIS:  Cancer Staging  Malignant neoplasm of upper-outer quadrant of right breast in female, estrogen receptor negative (HCC) Staging form: Breast, AJCC 8th Edition - Clinical stage from 11/11/2021: Stage IIIA (cT3, cN1, cM0, G3, ER+, PR+, HER2-) - Signed by Sydney Cea, MD on 02/18/2022 Stage prefix: Initial diagnosis Histologic grading system: 3 grade system    SUMMARY OF ONCOLOGIC HISTORY: Oncology History  Malignant neoplasm of upper-outer quadrant of right breast in female, estrogen receptor negative (HCC)  11/04/2021 Initial Diagnosis    Patient has been breast-feeding.  Felt a painful right breast mass and an axillary mass.  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 with a Ki-67 of 40%, 1 abnormal lymph node 5.8 cm biopsy: Grade 3 IDC ER 5%, PR 0%, HER2 negative, Ki-67 40%   11/11/2021 Cancer Staging   Staging form: Breast, AJCC 8th Edition - Clinical stage from 11/11/2021: Stage IIIA (cT3, cN1, cM0, G3, ER+, PR+, HER2-) - Signed by Sydney Cea, MD on 02/18/2022 Stage prefix: Initial diagnosis Histologic grading system: 3 grade system   11/30/2021 - 04/29/2022 Chemotherapy   Patient is on Treatment Plan : BREAST Pembrolizumab  + Carboplatin  D1 + Paclitaxel  D1,8,15 q21d X 4 cycles / Pembrolizumab  + AC q21d x 4 cycles     11/30/2021 Genetic Testing   Negative genetic testing on the CancerNext-Expanded+RNA insight panel.  The report date is 11/30/2021  The CancerNext-Expanded gene panel offered by [facility] and includes sequencing and rearrangement analysis for the following 77 genes: AIP, ALK, APC*, ATM*, AXIN2, BAP1, BARD1, BLM, BMPR1A, BRCA1*, BRCA2*, BRIP1*, CDC73, CDH1*, CDK4, CDKN1B, CDKN2A, CHEK2*, CTNNA1, DICER1, FANCC, FH, FLCN, GALNT12, KIF1B, LZTR1, MAX, MEN1, MET, MLH1*, MSH2*, MSH3, MSH6*, MUTYH*,  NBN, NF1*, NF2, NTHL1, PALB2*, PHOX2B, PMS2*, POT1, PRKAR1A, PTCH1, PTEN*, RAD51C*, RAD51D*, RB1, RECQL, RET, SDHA, SDHAF2, SDHB, SDHC, SDHD, SMAD4, SMARCA4, SMARCB1, SMARCE1, STK11, SUFU, TMEM127, TP53*, TSC1, TSC2, VHL and XRCC2 (sequencing and deletion/duplication); EGFR, EGLN1, HOXB13, KIT, MITF, PDGFRA, POLD1, and POLE (sequencing only); EPCAM and GREM1 (deletion/duplication only). DNA and RNA analyses performed for * genes.    05/18/2022 - 08/19/2022 Chemotherapy   Patient is on Treatment Plan : BREAST Pembrolizumab  (200) q21d x 27 weeks     07/27/2022 - 09/10/2022 Radiation Therapy   50.4 Gy in 28 treatments "Boost": 6 Gy in 3 treatments     CURRENT THERAPY: observation.   INTERVAL HISTORY:  Discussed the use of AI scribe software for clinical note transcription with the patient, who gave verbal consent to proceed.  Sydney Rios is a 43 year old female who presents with bilateral breast tenderness.  She experiences persistent bilateral breast tenderness, exacerbated by physical contact, including hugs from her children. Self-exams are painful. Random tenderness occurs under her arms, more pronounced on the right side, and there is persistent tenderness over her ribs. Her last mammogram in May 2024 showed no evidence of malignancy. She has a history of breast surgery, with the right breast now smaller than the left. No lumps have been noticed, but her first MRI showed non-mass enhancement.  She went to schedule her mammogram, however due to her tenderness was recommended to come and see us  prior to undergoing.      Patient Active Problem List   Diagnosis Date Noted   Iron  deficiency anemia 06/23/2023   Incisional hernia, without obstruction  or gangrene 11/01/2022   Genetic testing 12/01/2021   Port-A-Cath in place 11/30/2021   Malignant neoplasm of upper-outer quadrant of right breast in female, estrogen receptor negative (HCC) 11/11/2021   Abnormal maternal glucose tolerance,  antepartum 09/15/2018   S/P cesarean section 10/11/2014   TACHYCARDIA 05/01/2010    has no known allergies.  MEDICAL HISTORY: Past Medical History:  Diagnosis Date   Anxiety    Cancer (HCC)    Right Breast CA   Chronic kidney disease    hx kidney stones   Complication of anesthesia    runs heart rate in the 30-40s in PACU   History of kidney stones    Hx gestational diabetes    Hx of Hashimoto thyroiditis    PONV (postoperative nausea and vomiting)     SURGICAL HISTORY: Past Surgical History:  Procedure Laterality Date   CESAREAN SECTION     CESAREAN SECTION  01/23/2013   Procedure: CESAREAN SECTION;  Surgeon: Denette Finner, MD;  Location: [facility];  Service: Obstetrics;  Laterality: N/A;   CESAREAN SECTION N/A 10/11/2014   Procedure: CESAREAN SECTION;  Surgeon: Martine Sleek, MD;  Location: [facility];  Service: Obstetrics;  Laterality: N/A;   CESAREAN SECTION N/A 10/24/2018   Procedure: REPEAT CESAREAN SECTION;  Surgeon: Thurman Flores, MD;  Location: [facility] BIRTHING SUIT[facility];  Service: Obstetrics;  Laterality: N/A;  Tracey RNFA   LITHOTRIPSY     PORTACATH PLACEMENT Left 11/25/2021   Procedure: PORT PLACEMENT;  Surgeon: Enid Harry, MD;  Location: [facility] OR;  Service: General;  Laterality: Left;   WISDOM TOOTH EXTRACTION      SOCIAL HISTORY: Social History   Socioeconomic History   Marital status: Married    Spouse name: Not on file   Number of children: Not on file   Years of education: Not on file   Highest education level: Not on file  Occupational History   Not on file  Tobacco Use   Smoking status: Never   Smokeless tobacco: Never  Vaping Use   Vaping status: Never Used  Substance and Sexual Activity   Alcohol use: No   Drug use: No   Sexual activity: Yes    Birth control/protection: Other-see comments    Comment: Husband has had a vasectomy  Other Topics Concern   Not on file  Social History Narrative   Not on file   Social Drivers of Health    Financial Resource Strain: Low Risk  (10/09/2018)   Overall Financial Resource Strain (CARDIA)    Difficulty of Paying Living Expenses: Not hard at all  Food Insecurity: No Food Insecurity (04/12/2022)   Received from [facility]   Hunger Vital Sign    Within the past 12 months, you worried that your food would run out before you got the money to buy more.: Never true    Within the past 12 months, the food you bought just didn't last and you didn't have money to get more.: Never true  Transportation Needs: Unknown (10/09/2018)   PRAPARE - Administrator, Civil Service (Medical): No    Lack of Transportation (Non-Medical): Not on file  Physical Activity: Not on file  Stress: No Stress Concern Present (10/09/2018)   [employer] of Occupational Health - Occupational Stress Questionnaire    Feeling of Stress : Only a little  Social Connections: Unknown (04/25/2022)   Received from [facility]   Social Network    Social Network: Not on file  Intimate Partner  Violence: Unknown (03/31/2022)   Received from [facility]   HITS    Physically Hurt: Not on file    Insult or Talk Down To: Not on file    Threaten Physical Harm: Not on file    Scream or Curse: Not on file    FAMILY HISTORY: Family History  Problem Relation Age of Onset   Hypertension Mother    Diabetes Mother    Hypertension Father    Hypertension Maternal Grandmother    Diabetes Maternal Grandmother    Heart disease Maternal Grandfather    Hypertension Maternal Grandfather    Heart disease Paternal Grandfather    Hypertension Paternal Grandfather    Retinoblastoma Cousin        dx 3 mo - left eye    Review of Systems  Constitutional:  Negative for appetite change, chills, fatigue, fever and unexpected weight change.  HENT:   Negative for hearing loss, lump/mass and trouble swallowing.   Eyes:  Negative for eye problems and icterus.  Respiratory:  Negative for chest tightness, cough and  shortness of breath.   Cardiovascular:  Negative for chest pain, leg swelling and palpitations.  Gastrointestinal:  Negative for abdominal distention, abdominal pain, constipation, diarrhea, nausea and vomiting.  Endocrine: Negative for hot flashes.  Genitourinary:  Negative for difficulty urinating.   Musculoskeletal:  Negative for arthralgias.  Skin:  Negative for itching and rash.  Neurological:  Negative for dizziness, extremity weakness, headaches and numbness.  Hematological:  Negative for adenopathy. Does not bruise/bleed easily.  Psychiatric/Behavioral:  Negative for depression. The patient is not nervous/anxious.       PHYSICAL EXAMINATION    Vitals:   06/07/24 0955  BP: 102/64  Pulse: 71  Resp: 18  Temp: 98.3 F (36.8 C)  SpO2: 100%    Physical Exam Constitutional:      General: She is not in acute distress.    Appearance: Normal appearance. She is not toxic-appearing.  HENT:     Head: Normocephalic and atraumatic.   Eyes:     General: No scleral icterus.  Chest:     Comments: Bilateral breast tenderness noted, no focal tenderness. Abdominal:     General: Bowel sounds are normal.   Musculoskeletal:        General: No swelling.     Cervical back: Neck supple.  Lymphadenopathy:     Upper Body:     Right upper body: No supraclavicular or axillary adenopathy.     Left upper body: No supraclavicular or axillary adenopathy.   Skin:    General: Skin is warm and dry.     Findings: No rash.   Neurological:     General: No focal deficit present.     Mental Status: She is alert.   Psychiatric:        Mood and Affect: Mood normal.        Behavior: Behavior normal.      ASSESSMENT and THERAPY PLAN:   Malignant neoplasm of upper-outer quadrant of right breast in female, estrogen receptor negative (HCC) 11/04/2021: Patient has been breast-feeding.  Felt a painful right breast mass and an axillary mass.  Breast mass by ultrasound at 2:00: 2.6 cm biopsy  revealed grade 3 IDC triple negative with a Ki-67 of 40%, 1 abnormal lymph node 5.8 cm biopsy: Grade 3 IDC ER 5%, PR 0%, HER2 negative, Ki-67 40%   CT CAP 11/20/2021: Right breast cancer with axillary lymph nodes, mildly enlarged right retropectoral lymph nodes, right internal mammary  lymph nodes Breast MRI 11/17/2021: Known breast cancer 2 cm, right axillary lymph node, non-mass enhancement 7 cm, borderline enlarged right internal mammary lymph nodes   Treatment Plan: based on multidisciplinary tumor board: 1. Neoadjuvant chemotherapy with Taxol  weekly 12 with Keytruda  and carboplatin  every 3 weeks x4 Adriamycin  and Cytoxan , Keytruda  dose dense 4 followed by  followed by Keytruda  maintenance for 1 year completed 08/19/2022 2. 06/18/2022:[facility] lumpectomy (at [facility]): No residual invasive or in situ carcinoma.  0/5 lymph nodes 3. Followed by adjuvant radiation therapy completed September 2023.  ------------------------------------------------------------------------------------------------------ Breast cancer surveillance: Breast exam 06/22/2023: Benign Mammogram 05/10/2023: Benign [facility] November 2024: Negative  Bilateral breast tenderness Chronic tenderness likely due to hormonal changes and weight gain. Previous mammogram showed no malignancy. Consider supplemental MRI per NCCN guidelines for women diagnosed with breast cancer under age 42. - Order diagnostic mammogram at breast center. - Discuss potential supplemental breast MRI per NCCN guidelines. - Advise contact via MyChart message if proceeding with MRI.  RTC in 08/2024 for f/u with Dr. Gudena as scheduled   All questions were answered. The patient knows to call the clinic with any problems, questions or concerns. We can certainly see the patient much sooner if necessary.  Total encounter time:20 minutes*in face-to-face visit time, chart review, lab review, care coordination, order entry, and documentation of the encounter  time.    Alwin Baars, NP 06/07/24 1:06 PM Medical Oncology and Hematology [facility] [address] [place], [place] [address] Tel. [phone]    Fax. [phone]  *Total Encounter Time as defined by the Centers for Medicare and Medicaid Services includes, in addition to the face-to-face time of a patient visit (documented in the note above) non-face-to-face time: obtaining and reviewing outside history, ordering and reviewing medications, tests or procedures, care coordination (communications with other health care professionals or caregivers) and documentation in the medical record.

## 2024-06-07 ENCOUNTER — Inpatient Hospital Stay: Attending: Hematology and Oncology | Admitting: Adult Health

## 2024-06-07 VITALS — BP 102/64 | HR 71 | Temp 98.3°F | Resp 18 | Ht 67.0 in | Wt 164.8 lb

## 2024-06-07 DIAGNOSIS — C50411 Malignant neoplasm of upper-outer quadrant of right female breast: Secondary | ICD-10-CM

## 2024-06-07 DIAGNOSIS — N644 Mastodynia: Secondary | ICD-10-CM | POA: Diagnosis not present

## 2024-06-07 DIAGNOSIS — D509 Iron deficiency anemia, unspecified: Secondary | ICD-10-CM | POA: Diagnosis present

## 2024-06-07 DIAGNOSIS — Z853 Personal history of malignant neoplasm of breast: Secondary | ICD-10-CM | POA: Diagnosis present

## 2024-06-07 DIAGNOSIS — Z08 Encounter for follow-up examination after completed treatment for malignant neoplasm: Secondary | ICD-10-CM | POA: Diagnosis present

## 2024-06-07 DIAGNOSIS — Z171 Estrogen receptor negative status [ER-]: Secondary | ICD-10-CM | POA: Diagnosis not present

## 2024-06-07 NOTE — Assessment & Plan Note (Signed)
 11/04/2021: Patient has been breast-feeding.  Felt a painful right breast mass and an axillary mass.  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 with a Ki-67 of 40%, 1 abnormal lymph node 5.8 cm biopsy: Grade 3 IDC ER 5%, PR 0%, HER2 negative, Ki-67 40%   CT CAP 11/20/2021: Right breast cancer with axillary lymph nodes, mildly enlarged right retropectoral lymph nodes, right internal mammary lymph nodes Breast MRI 11/17/2021: Known breast cancer 2 cm, right axillary lymph node, non-mass enhancement 7 cm, borderline enlarged right internal mammary lymph nodes   Treatment Plan: based on multidisciplinary tumor board: 1. Neoadjuvant chemotherapy with Taxol  weekly 12 with Keytruda  and carboplatin  every 3 weeks x4 Adriamycin  and Cytoxan , Keytruda  dose dense 4 followed by  followed by Keytruda  maintenance for 1 year completed 08/19/2022 2. 06/18/2022:[facility] lumpectomy (at [facility]): No residual invasive or in situ carcinoma.  0/5 lymph nodes 3. Followed by adjuvant radiation therapy completed September 2023.  ------------------------------------------------------------------------------------------------------ Breast cancer surveillance: Breast exam 06/22/2023: Benign Mammogram 05/10/2023: Benign Signatera November 2024: Negative  Bilateral breast tenderness Chronic tenderness likely due to hormonal changes and weight gain. Previous mammogram showed no malignancy. Consider supplemental MRI per NCCN guidelines for women diagnosed with breast cancer under age 62. - Order diagnostic mammogram at breast center. - Discuss potential supplemental breast MRI per NCCN guidelines. - Advise contact via MyChart message if proceeding with MRI.

## 2024-06-12 ENCOUNTER — Other Ambulatory Visit: Payer: Self-pay | Admitting: Adult Health

## 2024-06-12 DIAGNOSIS — Z171 Estrogen receptor negative status [ER-]: Secondary | ICD-10-CM

## 2024-06-12 DIAGNOSIS — N644 Mastodynia: Secondary | ICD-10-CM

## 2024-06-20 ENCOUNTER — Ambulatory Visit: Admission: RE | Admit: 2024-06-20 | Source: Ambulatory Visit

## 2024-06-20 ENCOUNTER — Ambulatory Visit
Admission: RE | Admit: 2024-06-20 | Discharge: 2024-06-20 | Disposition: A | Discharge status: Home / Self Care | Source: Ambulatory Visit | Attending: Adult Health | Admitting: Adult Health

## 2024-06-20 DIAGNOSIS — N644 Mastodynia: Secondary | ICD-10-CM

## 2024-06-20 DIAGNOSIS — C50411 Malignant neoplasm of upper-outer quadrant of right female breast: Secondary | ICD-10-CM

## 2024-07-30 ENCOUNTER — Encounter: Payer: Self-pay | Admitting: Hematology and Oncology

## 2024-08-24 LAB — SIGNATERA
SIGNATERA MTM READOUT: 0 MTM/ml
SIGNATERA TEST RESULT: NEGATIVE

## 2024-09-10 ENCOUNTER — Inpatient Hospital Stay: Admitting: Hematology and Oncology

## 2024-09-10 ENCOUNTER — Other Ambulatory Visit: Attending: Hematology and Oncology

## 2024-09-24 ENCOUNTER — Inpatient Hospital Stay: Attending: Hematology and Oncology

## 2024-09-24 DIAGNOSIS — Z853 Personal history of malignant neoplasm of breast: Secondary | ICD-10-CM | POA: Insufficient documentation

## 2024-09-24 DIAGNOSIS — D509 Iron deficiency anemia, unspecified: Secondary | ICD-10-CM | POA: Insufficient documentation

## 2024-09-24 DIAGNOSIS — Z08 Encounter for follow-up examination after completed treatment for malignant neoplasm: Secondary | ICD-10-CM | POA: Diagnosis present

## 2024-09-24 LAB — CBC WITH DIFFERENTIAL (CANCER CENTER ONLY)
Abs Immature Granulocytes: 0.01 K/uL (ref 0.00–0.07)
Basophils Absolute: 0 K/uL (ref 0.0–0.1)
Basophils Relative: 1 %
Eosinophils Absolute: 0 K/uL (ref 0.0–0.5)
Eosinophils Relative: 1 %
HCT: 37.2 % (ref 36.0–46.0)
Hemoglobin: 12.7 g/dL (ref 12.0–15.0)
Immature Granulocytes: 0 %
Lymphocytes Relative: 16 %
Lymphs Abs: 0.7 K/uL (ref 0.7–4.0)
MCH: 31.3 pg (ref 26.0–34.0)
MCHC: 34.1 g/dL (ref 30.0–36.0)
MCV: 91.6 fL (ref 80.0–100.0)
Monocytes Absolute: 0.4 K/uL (ref 0.1–1.0)
Monocytes Relative: 9 %
Neutro Abs: 3.3 K/uL (ref 1.7–7.7)
Neutrophils Relative %: 73 %
Platelet Count: 203 K/uL (ref 150–400)
RBC: 4.06 MIL/uL (ref 3.87–5.11)
RDW: 13.2 % (ref 11.5–15.5)
WBC Count: 4.4 K/uL (ref 4.0–10.5)
nRBC: 0 % (ref 0.0–0.2)

## 2024-09-24 LAB — IRON AND IRON BINDING CAPACITY (CC-WL,HP ONLY)
Iron: 125 ug/dL (ref 28–170)
Saturation Ratios: 33 % — ABNORMAL HIGH (ref 10.4–31.8)
TIBC: 374 ug/dL (ref 250–450)
UIBC: 249 ug/dL (ref 148–442)

## 2024-09-24 LAB — FERRITIN: Ferritin: 17 ng/mL (ref 11–307)

## 2024-09-27 ENCOUNTER — Ambulatory Visit: Admitting: Hematology and Oncology

## 2024-10-01 ENCOUNTER — Inpatient Hospital Stay: Attending: Hematology and Oncology | Admitting: Hematology and Oncology

## 2024-10-01 VITALS — BP 115/78 | HR 81 | Temp 98.2°F | Resp 18 | Ht 67.0 in | Wt 171.8 lb

## 2024-10-01 DIAGNOSIS — N6312 Unspecified lump in the right breast, upper inner quadrant: Secondary | ICD-10-CM | POA: Insufficient documentation

## 2024-10-01 DIAGNOSIS — N644 Mastodynia: Secondary | ICD-10-CM | POA: Diagnosis not present

## 2024-10-01 DIAGNOSIS — Z171 Estrogen receptor negative status [ER-]: Secondary | ICD-10-CM | POA: Insufficient documentation

## 2024-10-01 DIAGNOSIS — D509 Iron deficiency anemia, unspecified: Secondary | ICD-10-CM | POA: Diagnosis not present

## 2024-10-01 DIAGNOSIS — C50411 Malignant neoplasm of upper-outer quadrant of right female breast: Secondary | ICD-10-CM | POA: Insufficient documentation

## 2024-10-01 NOTE — Assessment & Plan Note (Signed)
 Cause: Heavy menstrual cycles colonoscopy October 2023 normal (cycles have lightened up) IV iron : July 2024   Lab review: 12/05/2023: Hemoglobin 12.4, iron  saturation 45%, ferritin 19 03/05/2024: Hemoglobin 12.8, MCV 93.6, iron  saturation 43%, ferritin 12 09/24/2024: Hemoglobin 12.7, MCV 91.6, iron  saturation 33%, ferritin 17   No role of IV iron  therapy Recheck labs in 1 year

## 2024-10-01 NOTE — Progress Notes (Signed)
 Patient Care Team: [name], MD as PCP - General (Hematology and Oncology) [name], MD as Attending Physician (Hematology and Oncology) Dewey Rush, MD as Consulting Physician (Radiation Oncology) Andrew Reusing, MD as Referring Physician (General Surgery)  DIAGNOSIS:  Encounter Diagnoses  Name Primary?   Malignant neoplasm of upper-outer quadrant of right breast in female, estrogen receptor negative (HCC) Yes   Iron  deficiency anemia, unspecified iron  deficiency anemia type     SUMMARY OF ONCOLOGIC HISTORY: Oncology History  Malignant neoplasm of upper-outer quadrant of right breast in female, estrogen receptor negative (HCC)  11/04/2021 Initial Diagnosis    Patient has been breast-feeding.  Felt a painful right breast mass and an axillary mass.  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 with a Ki-67 of 40%, 1 abnormal lymph node 5.8 cm biopsy: Grade 3 IDC ER 5%, PR 0%, HER2 negative, Ki-67 40%   11/11/2021 Cancer Staging   Staging form: Breast, AJCC 8th Edition - Clinical stage from 11/11/2021: Stage IIIA (cT3, cN1, cM0, G3, ER+, PR+, HER2-) - Signed by [name], MD on 02/18/2022 Stage prefix: Initial diagnosis Histologic grading system: 3 grade system   11/30/2021 - 04/29/2022 Chemotherapy   Patient is on Treatment Plan : BREAST Pembrolizumab  + Carboplatin  D1 + Paclitaxel  D1,8,15 q21d X 4 cycles / Pembrolizumab  + AC q21d x 4 cycles     11/30/2021 Genetic Testing   Negative genetic testing on the CancerNext-Expanded+RNA insight panel.  The report date is 11/30/2021  The CancerNext-Expanded gene panel offered by [facility] and includes sequencing and rearrangement analysis for the following 77 genes: AIP, ALK, APC*, ATM*, AXIN2, BAP1, BARD1, BLM, BMPR1A, BRCA1*, BRCA2*, BRIP1*, CDC73, CDH1*, CDK4, CDKN1B, CDKN2A, CHEK2*, CTNNA1, DICER1, FANCC, FH, FLCN, GALNT12, KIF1B, LZTR1, MAX, MEN1, MET, MLH1*, MSH2*, MSH3, MSH6*,  MUTYH*, NBN, NF1*, NF2, NTHL1, PALB2*, PHOX2B, PMS2*, POT1, PRKAR1A, PTCH1, PTEN*, RAD51C*, RAD51D*, RB1, RECQL, RET, SDHA, SDHAF2, SDHB, SDHC, SDHD, SMAD4, SMARCA4, SMARCB1, SMARCE1, STK11, SUFU, TMEM127, TP53*, TSC1, TSC2, VHL and XRCC2 (sequencing and deletion/duplication); EGFR, EGLN1, HOXB13, KIT, MITF, PDGFRA, POLD1, and POLE (sequencing only); EPCAM and GREM1 (deletion/duplication only). DNA and RNA analyses performed for * genes.    05/18/2022 - 08/19/2022 Chemotherapy   Patient is on Treatment Plan : BREAST Pembrolizumab  (200) q21d x 27 weeks     07/27/2022 - 09/10/2022 Radiation Therapy   50.4 Gy in 28 treatments "Boost": 6 Gy in 3 treatments     CHIEF COMPLIANT: Surveillance of breast cancer  HISTORY OF PRESENT ILLNESS:  History of Present Illness Sydney Rios is a 43 year old female with breast cancer who presents for follow-up regarding her cancer treatment and monitoring.  She experiences ongoing fatigue, which she attributes to thyroid  issues, and has recently started a new medication, taking two tablets once a day. She has been undergoing regular monitoring for breast cancer, with her last mammogram on June 25th. She has switched to a new breast center. She describes persistent breast tenderness and swelling, similar to when she was nursing, and has gained weight. Her ferritin level is 17. She continues to undergo Signatera testing, with the last test in August showing good results after an initial insufficient sample in May.     ALLERGIES:  has no known allergies.  MEDICATIONS:  Current Outpatient Medications  Medication Sig Dispense Refill   ALPRAZolam (XANAX) 0.25 MG tablet Take 0.125 mg by mouth at bedtime as needed.     Ascorbic Acid (VITAMIN  C) 1000 MG tablet Take 2,000 mg by mouth every 30 (thirty) days.     cholecalciferol (VITAMIN D3) 25 MCG (1000 UNIT) tablet Take 10,000 Units by mouth 3 (three) times a week.     levothyroxine  (SYNTHROID ) 175 MCG tablet Take 175  mcg by mouth daily before breakfast.     liothyronine (CYTOMEL) 5 MCG tablet Take 10 mcg by mouth daily.     Naltrexone HCl, Pain, (NALTREX) 1.5 MG CAPS 4 mg.     ondansetron  (ZOFRAN ) 4 MG tablet Take 4 mg by mouth every 8 (eight) hours as needed.     tretinoin  (RETIN-A ) 0.025 % cream SMARTSIG:sparingly Topical Every Night     valACYclovir  (VALTREX ) 1000 MG tablet Take 1 tablet (1,000 mg total) by mouth 3 (three) times daily. 30 tablet 0   acyclovir ointment (ZOVIRAX) 5 %  (Patient not taking: Reported on 10/01/2024)     No current facility-administered medications for this visit.    PHYSICAL EXAMINATION: ECOG PERFORMANCE STATUS: 1 - Symptomatic but completely ambulatory  Vitals:   10/01/24 1535  BP: 115/78  Pulse: 81  Resp: 18  Temp: 98.2 F (36.8 C)  SpO2: 100%   Filed Weights   10/01/24 1535  Weight: 171 lb 12.8 oz (77.9 kg)     LABORATORY DATA:  I have reviewed the data as listed    Latest Ref Rng & Units 09/05/2023   11:02 AM 05/30/2023   10:25 AM 01/24/2023   10:41 AM  CMP  Glucose 70 - 99 mg/dL 883  896  91   BUN 6 - 20 mg/dL 11  17  10    Creatinine 0.44 - 1.00 mg/dL 9.36  9.27  9.28   Sodium 135 - 145 mmol/L 140  139  140   Potassium 3.5 - 5.1 mmol/L 3.7  4.1  4.1   Chloride 98 - 111 mmol/L 111  108  109   CO2 22 - 32 mmol/L 25  28  27    Calcium 8.9 - 10.3 mg/dL 9.5  9.5  9.8   Total Protein 6.5 - 8.1 g/dL 6.6  6.7  6.8   Total Bilirubin 0.3 - 1.2 mg/dL 0.4  0.3  0.4   Alkaline Phos 38 - 126 U/L 38  34  37   AST 15 - 41 U/L 11  14  12    ALT 0 - 44 U/L 11  10  8      Lab Results  Component Value Date   WBC 4.4 09/24/2024   HGB 12.7 09/24/2024   HCT 37.2 09/24/2024   MCV 91.6 09/24/2024   PLT 203 09/24/2024   NEUTROABS 3.3 09/24/2024    ASSESSMENT & PLAN:  Malignant neoplasm of upper-outer quadrant of right breast in female, estrogen receptor negative (HCC) 11/04/2021: Patient has been breast-feeding.  Felt a painful right breast mass and an axillary mass.   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 with a Ki-67 of 40%, 1 abnormal lymph node 5.8 cm biopsy: Grade 3 IDC ER 5%, PR 0%, HER2 negative, Ki-67 40%   CT CAP 11/20/2021: Right breast cancer with axillary lymph nodes, mildly enlarged right retropectoral lymph nodes, right internal mammary lymph nodes Breast MRI 11/17/2021: Known breast cancer 2 cm, right axillary lymph node, non-mass enhancement 7 cm, borderline enlarged right internal mammary lymph nodes   Treatment Plan: based on multidisciplinary tumor board: 1. Neoadjuvant chemotherapy with Taxol  weekly 12 with Keytruda  and carboplatin  every 3 weeks  x4 Adriamycin  and Cytoxan , Keytruda  dose dense 4 followed by  followed by Keytruda  maintenance for 1 year completed 08/19/2022 2. 06/18/2022:Right lum[facility]omy (at [facility]): No residual invasive or in situ carcinoma.  0/5 lymph nodes 3. Followed by adjuvant radiation therapy completed September 2023.  ------------------------------------------------------------------------------------------------------ Breast cancer surveillance: Breast exam 06/22/2023: Benign Mammogram 06/20/2024: Benign.  Signatera 08/24/2024: Negative  Return to clinic in 1 year for follow-up  Iron  deficiency anemia Cause: Heavy menstrual cycles colonoscopy October 2023 normal (cycles have lightened up) IV iron : July 2024   Lab review: 12/05/2023: Hemoglobin 12.4, iron  saturation 45%, ferritin 19 03/05/2024: Hemoglobin 12.8, MCV 93.6, iron  saturation 43%, ferritin 12 09/24/2024: Hemoglobin 12.7, MCV 91.6, iron  saturation 33%, ferritin 17   No role of IV iron  therapy Recheck labs in 1 year   No orders of the defined types were placed in this encounter.  The patient has a good understanding of the overall plan. she agrees with it. she will call with any problems that may develop before the next visit here. Total time spent: 30 mins including face to face time and time  spent for planning, charting and co-ordination of care   Naomi MARLA Chad, MD 10/01/24

## 2024-10-01 NOTE — Assessment & Plan Note (Signed)
 11/04/2021: Patient has been breast-feeding.  Felt a painful right breast mass and an axillary mass.  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 with a Ki-67 of 40%, 1 abnormal lymph node 5.8 cm biopsy: Grade 3 IDC ER 5%, PR 0%, HER2 negative, Ki-67 40%   CT CAP 11/20/2021: Right breast cancer with axillary lymph nodes, mildly enlarged right retropectoral lymph nodes, right internal mammary lymph nodes Breast MRI 11/17/2021: Known breast cancer 2 cm, right axillary lymph node, non-mass enhancement 7 cm, borderline enlarged right internal mammary lymph nodes   Treatment Plan: based on multidisciplinary tumor board: 1. Neoadjuvant chemotherapy with Taxol  weekly 12 with Keytruda  and carboplatin  every 3 weeks x4 Adriamycin  and Cytoxan , Keytruda  dose dense 4 followed by  followed by Keytruda  maintenance for 1 year completed 08/19/2022 2. 06/18/2022:Right lumpectomy (at [facility]): No residual invasive or in situ carcinoma.  0/5 lymph nodes 3. Followed by adjuvant radiation therapy completed September 2023.  ------------------------------------------------------------------------------------------------------ Breast cancer surveillance: Breast exam 06/22/2023: Benign Mammogram 06/20/2024: Benign.  Signatera 08/24/2024: Negative  Return to clinic in 1 year for follow-up

## 2024-10-03 ENCOUNTER — Encounter: Payer: Self-pay | Admitting: Adult Health

## 2024-10-05 ENCOUNTER — Inpatient Hospital Stay (HOSPITAL_BASED_OUTPATIENT_CLINIC_OR_DEPARTMENT_OTHER): Admitting: Adult Health

## 2024-10-05 VITALS — BP 104/49 | HR 74 | Temp 98.2°F | Resp 16 | Wt 174.5 lb

## 2024-10-05 DIAGNOSIS — C50411 Malignant neoplasm of upper-outer quadrant of right female breast: Secondary | ICD-10-CM

## 2024-10-05 DIAGNOSIS — Z171 Estrogen receptor negative status [ER-]: Secondary | ICD-10-CM

## 2024-10-05 DIAGNOSIS — N63 Unspecified lump in unspecified breast: Secondary | ICD-10-CM

## 2024-10-05 DIAGNOSIS — N6312 Unspecified lump in the right breast, upper inner quadrant: Secondary | ICD-10-CM | POA: Diagnosis not present

## 2024-10-05 NOTE — Progress Notes (Signed)
 [facility] Cancer Follow up:    Sydney Potts, MD [address] [place] [address]   DIAGNOSIS: Cancer Staging  Malignant neoplasm of upper-outer quadrant of right breast in female, estrogen receptor negative (HCC) Staging form: Breast, AJCC 8th Edition - Clinical stage from 11/11/2021: Stage IIIA (cT3, cN1, cM0, G3, ER+, PR+, HER2-) - Signed by Sydney Potts, MD on 02/18/2022 Stage prefix: Initial diagnosis Histologic grading system: 3 grade system    SUMMARY OF ONCOLOGIC HISTORY: Oncology History  Malignant neoplasm of upper-outer quadrant of right breast in female, estrogen receptor negative (HCC)  11/04/2021 Initial Diagnosis    Patient has been breast-feeding.  Felt a painful right breast mass and an axillary mass.  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 with a Ki-67 of 40%, 1 abnormal lymph node 5.8 cm biopsy: Grade 3 IDC ER 5%, PR 0%, HER2 negative, Ki-67 40%   11/11/2021 Cancer Staging   Staging form: Breast, AJCC 8th Edition - Clinical stage from 11/11/2021: Stage IIIA (cT3, cN1, cM0, G3, ER+, PR+, HER2-) - Signed by Sydney Potts, MD on 02/18/2022 Stage prefix: Initial diagnosis Histologic grading system: 3 grade system   11/30/2021 - 04/29/2022 Chemotherapy   Patient is on Treatment Plan : BREAST Pembrolizumab  + Carboplatin  D1 + Paclitaxel  D1,8,15 q21d X 4 cycles / Pembrolizumab  + AC q21d x 4 cycles     11/30/2021 Genetic Testing   Negative genetic testing on the CancerNext-Expanded+RNA insight panel.  The report date is 11/30/2021  The CancerNext-Expanded gene panel offered by [facility] and includes sequencing and rearrangement analysis for the following 77 genes: AIP, ALK, APC*, ATM*, AXIN2, BAP1, BARD1, BLM, BMPR1A, BRCA1*, BRCA2*, BRIP1*, CDC73, CDH1*, CDK4, CDKN1B, CDKN2A, CHEK2*, CTNNA1, DICER1, FANCC, FH, FLCN, GALNT12, KIF1B, LZTR1, MAX, MEN1, MET, MLH1*, MSH2*, MSH3, MSH6*, MUTYH*, NBN,  NF1*, NF2, NTHL1, PALB2*, PHOX2B, PMS2*, POT1, PRKAR1A, PTCH1, PTEN*, RAD51C*, RAD51D*, RB1, RECQL, RET, SDHA, SDHAF2, SDHB, SDHC, SDHD, SMAD4, SMARCA4, SMARCB1, SMARCE1, STK11, SUFU, TMEM127, TP53*, TSC1, TSC2, VHL and XRCC2 (sequencing and deletion/duplication); EGFR, EGLN1, HOXB13, KIT, MITF, PDGFRA, POLD1, and POLE (sequencing only); EPCAM and GREM1 (deletion/duplication only). DNA and RNA analyses performed for * genes.    05/18/2022 - 08/19/2022 Chemotherapy   Patient is on Treatment Plan : BREAST Pembrolizumab  (200) q21d x 27 weeks     07/27/2022 - 09/10/2022 Radiation Therapy   50.4 Gy in 28 treatments "Boost": 6 Gy in 3 treatments     CURRENT THERAPY: observation  INTERVAL HISTORY:  Discussed the use of AI scribe software for clinical note transcription with the patient, who gave verbal consent to proceed.  History of Present Illness Sydney Rios is a 43 year old female who presents with left breast tenderness and swelling.  She has experienced tenderness and swelling in her left breast since June 2025. The discomfort is not accompanied by significant pain, and the swelling has remained stable since her last clear mammogram in June 2025.  She recently started a new prescription medication, which may cause swelling, though it is unclear if it affects the breast specifically. Her children cannot lay on the affected area due to discomfort. The issue is in the non-radiated breast.    Patient Active Problem List   Diagnosis Date Noted   Iron  deficiency anemia 06/23/2023   Incisional hernia, without obstruction or gangrene 11/01/2022   Genetic testing 12/01/2021   Port-A-Cath in place 11/30/2021   Malignant neoplasm of upper-outer quadrant of  right breast in female, estrogen receptor negative (HCC) 11/11/2021   Abnormal maternal glucose tolerance, antepartum 09/15/2018   S/P cesarean section 10/11/2014   TACHYCARDIA 05/01/2010    has no known allergies.  MEDICAL  HISTORY: Past Medical History:  Diagnosis Date   Anxiety    Cancer (HCC)    Right Breast CA   Chronic kidney disease    hx kidney stones   Complication of anesthesia    runs heart rate in the 30-40s in PACU   History of kidney stones    Hx gestational diabetes    Hx of Hashimoto thyroiditis    PONV (postoperative nausea and vomiting)     SURGICAL HISTORY: Past Surgical History:  Procedure Laterality Date   CESAREAN SECTION     CESAREAN SECTION  01/23/2013   Procedure: CESAREAN SECTION;  Surgeon: Alm JAYSON Cook, MD;  Location: [facility] ORS;  Service: Obstetrics;  Laterality: N/A;   CESAREAN SECTION N/A 10/11/2014   Procedure: CESAREAN SECTION;  Surgeon: Rosaline LITTIE Cobble, MD;  Location: [facility] ORS;  Service: Obstetrics;  Laterality: N/A;   CESAREAN SECTION N/A 10/24/2018   Procedure: REPEAT CESAREAN SECTION;  Surgeon: Cobble Rosaline, MD;  Location: [facility] SU[facility];  Service: Obstetrics;  Laterality: N/A;  Tracey RNFA   LITHOTRIPSY     PORTACATH PLACEMENT Left 11/25/2021   Procedure: PORT PLACEMENT;  Surgeon: Ebbie Cough, MD;  Location: [facility] OR;  Service: General;  Laterality: Left;   WISDOM TOOTH EXTRACTION      SOCIAL HISTORY: Social History   Socioeconomic History   Marital status: Married    Spouse name: Not on file   Number of children: Not on file   Years of education: Not on file   Highest education level: Not on file  Occupational History   Not on file  Tobacco Use   Smoking status: Never   Smokeless tobacco: Never  Vaping Use   Vaping status: Never Used  Substance and Sexual Activity   Alcohol use: No   Drug use: No   Sexual activity: Yes    Birth control/protection: Other-see comments    Comment: Husband has had a vasectomy  Other Topics Concern   Not on file  Social History Narrative   Not on file   Social Drivers of Health   Financial Resource Strain: Low Risk  (10/09/2018)   Overall Financial Resource Strain (CARDIA)    Difficulty of Paying  Living Expenses: Not hard at all  Food Insecurity: No Food Insecurity (04/12/2022)   Received from [facility]   Hunger Vital Sign    Within the past 12 months, you worried that your food would run out before you got the money to buy more.: Never true    Within the past 12 months, the food you bought just didn't last and you didn't have money to get more.: Never true  Transportation Needs: Unknown (10/09/2018)   PRAPARE - Administrator, Civil Service (Medical): No    Lack of Transportation (Non-Medical): Not on file  Physical Activity: Not on file  Stress: No Stress Concern Present (10/09/2018)   [employer] of Occupational Health - Occupational Stress Questionnaire    Feeling of Stress : Only a little  Social Connections: Unknown (04/25/2022)   Received from [facility]   Social Network    Social Network: Not on file  Intimate Partner Violence: Unknown (03/31/2022)   Received from [facility]   HITS    Physically Hurt: Not on file  Insult or Talk Down To: Not on file    Threaten Physical Harm: Not on file    Scream or Curse: Not on file    FAMILY HISTORY: Family History  Problem Relation Age of Onset   Hypertension Mother    Diabetes Mother    Hypertension Father    Hypertension Maternal Grandmother    Diabetes Maternal Grandmother    Heart disease Maternal Grandfather    Hypertension Maternal Grandfather    Heart disease Paternal Grandfather    Hypertension Paternal Grandfather    Retinoblastoma Cousin        dx 3 mo - left eye    Review of Systems  Constitutional:  Negative for appetite change, chills, fatigue, fever and unexpected weight change.  HENT:   Negative for hearing loss, lump/mass and trouble swallowing.   Eyes:  Negative for eye problems and icterus.  Respiratory:  Negative for chest tightness, cough and shortness of breath.   Cardiovascular:  Negative for chest pain, leg swelling and palpitations.  Gastrointestinal:  Negative for  abdominal distention, abdominal pain, constipation, diarrhea, nausea and vomiting.  Endocrine: Negative for hot flashes.  Genitourinary:  Negative for difficulty urinating.   Musculoskeletal:  Negative for arthralgias.  Skin:  Negative for itching and rash.  Neurological:  Negative for dizziness, extremity weakness, headaches and numbness.  Hematological:  Negative for adenopathy. Does not bruise/bleed easily.  Psychiatric/Behavioral:  Negative for depression. The patient is not nervous/anxious.       PHYSICAL EXAMINATION   Onc Performance Status - 10/05/24 1225       ECOG Perf Status   ECOG Perf Status Fully active, able to carry on all pre-disease performance without restriction      KPS SCALE   KPS % SCORE Normal, no compliants, no evidence of disease          Vitals:   10/05/24 1220  BP: (!) 104/49  Pulse: 74  Resp: 16  Temp: 98.2 F (36.8 C)  SpO2: 100%    Physical Exam Constitutional:      General: She is not in acute distress.    Appearance: Normal appearance. She is not toxic-appearing.  HENT:     Head: Normocephalic and atraumatic.     Mouth/Throat:     Mouth: Mucous membranes are moist.     Pharynx: Oropharynx is clear. No oropharyngeal exudate or posterior oropharyngeal erythema.  Eyes:     General: No scleral icterus. Cardiovascular:     Rate and Rhythm: Normal rate and regular rhythm.     Pulses: Normal pulses.     Heart sounds: Normal heart sounds.  Pulmonary:     Effort: Pulmonary effort is normal.     Breath sounds: Normal breath sounds.  Abdominal:     General: Abdomen is flat. Bowel sounds are normal. There is no distension.     Palpations: Abdomen is soft.     Tenderness: There is no abdominal tenderness.  Musculoskeletal:        General: No swelling.     Cervical back: Neck supple.  Lymphadenopathy:     Cervical: No cervical adenopathy.  Skin:    General: Skin is warm and dry.     Findings: No rash.  Neurological:     General: No  focal deficit present.     Mental Status: She is alert.  Psychiatric:        Mood and Affect: Mood normal.        Behavior: Behavior normal.  LABORATORY DATA:  CBC    Component Value Date/Time   WBC 4.4 09/24/2024 1149   WBC 9.6 10/25/2018 0515   RBC 4.06 09/24/2024 1149   HGB 12.7 09/24/2024 1149   HCT 37.2 09/24/2024 1149   PLT 203 09/24/2024 1149   MCV 91.6 09/24/2024 1149   MCH 31.3 09/24/2024 1149   MCHC 34.1 09/24/2024 1149   RDW 13.2 09/24/2024 1149   LYMPHSABS 0.7 09/24/2024 1149   MONOABS 0.4 09/24/2024 1149   EOSABS 0.0 09/24/2024 1149   BASOSABS 0.0 09/24/2024 1149    CMP     Component Value Date/Time   NA 140 09/05/2023 1102   K 3.7 09/05/2023 1102   CL 111 09/05/2023 1102   CO2 25 09/05/2023 1102   GLUCOSE 116 (H) 09/05/2023 1102   BUN 11 09/05/2023 1102   CREATININE 0.63 09/05/2023 1102   CALCIUM 9.5 09/05/2023 1102   PROT 6.6 09/05/2023 1102   ALBUMIN 4.3 09/05/2023 1102   AST 11 (L) 09/05/2023 1102   ALT 11 09/05/2023 1102   ALKPHOS 38 09/05/2023 1102   BILITOT 0.4 09/05/2023 1102   GFRNONAA >60 09/05/2023 1102   GFRAA >60 10/22/2018 2142     ASSESSMENT and THERAPY PLAN:   Assessment and Plan Assessment & Plan Left breast swelling and tenderness (mastodynia) Left breast swelling and tenderness since June. Differential includes lobular cancer. Previous mammogram clear. MRI preferred for further evaluation due to sensitivity in detecting changes. - Order MRI of the left breast. - Consider surgical consultation if MRI indicates nipple changes or other concerning findings.  RTC in approximately 10 days after MRI completion to review results and next steps   All questions were answered. The patient knows to call the clinic with any problems, questions or concerns. We can certainly see the patient much sooner if necessary.  Total encounter time:20 minutes*in face-to-face visit time, chart review, lab review, care coordination, order entry,  and documentation of the encounter time.    Sydney Kendall, NP 10/05/24 12:29 PM Medical Oncology and Hematology [facility] [address] [place], [place] [address] Tel. [phone]    Fax. [phone]  *Total Encounter Time as defined by the Centers for Medicare and Medicaid Services includes, in addition to the face-to-face time of a patient visit (documented in the note above) non-face-to-face time: obtaining and reviewing outside history, ordering and reviewing medications, tests or procedures, care coordination (communications with other health care professionals or caregivers) and documentation in the medical record.

## 2024-10-07 ENCOUNTER — Encounter: Payer: Self-pay | Admitting: Hematology and Oncology

## 2024-10-12 ENCOUNTER — Encounter: Payer: Self-pay | Admitting: Adult Health

## 2024-10-16 ENCOUNTER — Inpatient Hospital Stay: Admitting: Adult Health

## 2024-10-24 ENCOUNTER — Other Ambulatory Visit

## 2024-10-24 ENCOUNTER — Inpatient Hospital Stay: Admitting: Adult Health

## 2024-10-25 ENCOUNTER — Inpatient Hospital Stay: Admitting: Adult Health

## 2024-10-31 ENCOUNTER — Inpatient Hospital Stay: Admitting: Adult Health

## 2024-11-28 ENCOUNTER — Ambulatory Visit
Admission: RE | Admit: 2024-11-28 | Discharge: 2024-11-28 | Disposition: A | Source: Ambulatory Visit | Attending: Adult Health | Admitting: Adult Health

## 2024-11-28 ENCOUNTER — Other Ambulatory Visit: Payer: Self-pay | Admitting: Adult Health

## 2024-11-28 ENCOUNTER — Ambulatory Visit: Payer: Self-pay | Admitting: Adult Health

## 2024-11-28 DIAGNOSIS — N6322 Unspecified lump in the left breast, upper inner quadrant: Secondary | ICD-10-CM

## 2024-11-28 DIAGNOSIS — N63 Unspecified lump in unspecified breast: Secondary | ICD-10-CM

## 2024-11-28 DIAGNOSIS — C50411 Malignant neoplasm of upper-outer quadrant of right female breast: Secondary | ICD-10-CM

## 2024-11-28 MED ORDER — GADOPICLENOL 0.5 MMOL/ML IV SOLN
8.0000 mL | Freq: Once | INTRAVENOUS | Status: AC | PRN
  Administered 2024-11-28: 8 mL via INTRAVENOUS

## 2024-11-28 NOTE — Progress Notes (Unsigned)
 Reviewed results with patient, will see her on Tuesday at 330pm to review images with her.  Orders for mammogram, ultrasound and biopsy were placed.    Morna Kendall, NP 11/28/24 4:27 PM Medical Oncology and Hematology [facility] [address] [place], [place] [address] Tel. [phone]    Fax. [phone]

## 2024-11-29 ENCOUNTER — Encounter: Payer: Self-pay | Admitting: Adult Health

## 2024-12-04 ENCOUNTER — Inpatient Hospital Stay: Attending: Hematology and Oncology | Admitting: Adult Health

## 2024-12-04 DIAGNOSIS — N6321 Unspecified lump in the left breast, upper outer quadrant: Secondary | ICD-10-CM | POA: Diagnosis not present

## 2024-12-04 DIAGNOSIS — C50411 Malignant neoplasm of upper-outer quadrant of right female breast: Secondary | ICD-10-CM | POA: Insufficient documentation

## 2024-12-04 DIAGNOSIS — Z171 Estrogen receptor negative status [ER-]: Secondary | ICD-10-CM | POA: Diagnosis not present

## 2024-12-04 DIAGNOSIS — N6312 Unspecified lump in the right breast, upper inner quadrant: Secondary | ICD-10-CM | POA: Insufficient documentation

## 2024-12-05 ENCOUNTER — Inpatient Hospital Stay: Admitting: Adult Health

## 2024-12-06 ENCOUNTER — Inpatient Hospital Stay: Admission: RE | Admit: 2024-12-06 | Discharge: 2024-12-06 | Attending: Adult Health | Admitting: Adult Health

## 2024-12-06 ENCOUNTER — Other Ambulatory Visit: Payer: Self-pay | Admitting: Adult Health

## 2024-12-06 ENCOUNTER — Ambulatory Visit
Admission: RE | Admit: 2024-12-06 | Discharge: 2024-12-06 | Disposition: A | Discharge status: Home / Self Care | Source: Ambulatory Visit | Attending: Adult Health | Admitting: Adult Health

## 2024-12-06 ENCOUNTER — Encounter: Payer: Self-pay | Admitting: Hematology and Oncology

## 2024-12-06 DIAGNOSIS — N6322 Unspecified lump in the left breast, upper inner quadrant: Secondary | ICD-10-CM

## 2024-12-06 DIAGNOSIS — C50411 Malignant neoplasm of upper-outer quadrant of right female breast: Secondary | ICD-10-CM

## 2024-12-06 HISTORY — PX: BREAST BIOPSY: SHX20

## 2024-12-06 NOTE — Progress Notes (Signed)
 [facility] Cancer Follow up:    Sydney Potts, MD [address] [place] [address]   DIAGNOSIS:  Cancer Staging  Malignant neoplasm of upper-outer quadrant of right breast in female, estrogen receptor negative (HCC) Staging form: Breast, AJCC 8th Edition - Clinical stage from 11/11/2021: Stage IIIA (cT3, cN1, cM0, G3, ER+, PR+, HER2-) - Signed by Sydney Potts, MD on 02/18/2022 Stage prefix: Initial diagnosis Histologic grading system: 3 grade system    SUMMARY OF ONCOLOGIC HISTORY: Oncology History  Malignant neoplasm of upper-outer quadrant of right breast in female, estrogen receptor negative (HCC)  11/04/2021 Initial Diagnosis    Patient has been breast-feeding.  Felt a painful right breast mass and an axillary mass.  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 with a Ki-67 of 40%, 1 abnormal lymph node 5.8 cm biopsy: Grade 3 IDC ER 5%, PR 0%, HER2 negative, Ki-67 40%   11/11/2021 Cancer Staging   Staging form: Breast, AJCC 8th Edition - Clinical stage from 11/11/2021: Stage IIIA (cT3, cN1, cM0, G3, ER+, PR+, HER2-) - Signed by Sydney Potts, MD on 02/18/2022 Stage prefix: Initial diagnosis Histologic grading system: 3 grade system   11/30/2021 - 04/29/2022 Chemotherapy   Patient is on Treatment Plan : BREAST Pembrolizumab  + Carboplatin  D1 + Paclitaxel  D1,8,15 q21d X 4 cycles / Pembrolizumab  + AC q21d x 4 cycles     11/30/2021 Genetic Testing   Negative genetic testing on the CancerNext-Expanded+RNA insight panel.  The report date is 11/30/2021  The CancerNext-Expanded gene panel offered by [facility] and includes sequencing and rearrangement analysis for the following 77 genes: AIP, ALK, APC*, ATM*, AXIN2, BAP1, BARD1, BLM, BMPR1A, BRCA1*, BRCA2*, BRIP1*, CDC73, CDH1*, CDK4, CDKN1B, CDKN2A, CHEK2*, CTNNA1, DICER1, FANCC, FH, FLCN, GALNT12, KIF1B, LZTR1, MAX, MEN1, MET, MLH1*, MSH2*, MSH3, MSH6*, MUTYH*,  NBN, NF1*, NF2, NTHL1, PALB2*, PHOX2B, PMS2*, POT1, PRKAR1A, PTCH1, PTEN*, RAD51C*, RAD51D*, RB1, RECQL, RET, SDHA, SDHAF2, SDHB, SDHC, SDHD, SMAD4, SMARCA4, SMARCB1, SMARCE1, STK11, SUFU, TMEM127, TP53*, TSC1, TSC2, VHL and XRCC2 (sequencing and deletion/duplication); EGFR, EGLN1, HOXB13, KIT, MITF, PDGFRA, POLD1, and POLE (sequencing only); EPCAM and GREM1 (deletion/duplication only). DNA and RNA analyses performed for * genes.    05/18/2022 - 08/19/2022 Chemotherapy   Patient is on Treatment Plan : BREAST Pembrolizumab  (200) q21d x 27 weeks     07/27/2022 - 09/10/2022 Radiation Therapy   50.4 Gy in 28 treatments Boost: 6 Gy in 3 treatments     CURRENT THERAPY: observation  INTERVAL HISTORY:  Sydney Rios is here for f/u and discussion of abnormal MRI.  She has been having some abnormalities in her left breast for a few months and mammogram was negative. She underwent breast MRI 11/28/2024 that demonstrated left bresat mass like enhancement and non mass enhancement spanning 2.9 cm.  She is here to look at the images and discuss next steps.  She is scheduled for biopsy under mammogram and ultrasound tomorrow at the breast center.     Patient Active Problem List   Diagnosis Date Noted   Iron  deficiency anemia 06/23/2023   Incisional hernia, without obstruction or gangrene 11/01/2022   Genetic testing 12/01/2021   Port-A-Cath in place 11/30/2021   Malignant neoplasm of upper-outer quadrant of right breast in female, estrogen receptor negative (HCC) 11/11/2021   Abnormal maternal glucose tolerance, antepartum 09/15/2018   S/P cesarean section 10/11/2014   TACHYCARDIA 05/01/2010    has no known allergies.  MEDICAL HISTORY: Past Medical History:  Diagnosis Date   Anxiety    Cancer (HCC)    Right Breast CA   Chronic kidney disease    hx kidney stones   Complication of anesthesia    runs heart rate in the 30-40s in PACU   History of kidney stones    Hx gestational diabetes    Hx of  Hashimoto thyroiditis    PONV (postoperative nausea and vomiting)     SURGICAL HISTORY: Past Surgical History:  Procedure Laterality Date   BREAST BIOPSY Left 12/06/2024   MM LT BREAST BX W LOC DEV 1ST LESION IMAGE BX SPEC STEREO GUIDE 12/06/2024 [facility] MAMMOGRAPHY   CESAREAN SECTION     CESAREAN SECTION  01/23/2013   Procedure: CESAREAN SECTION;  Surgeon: Alm JAYSON Cook, MD;  Location: [facility] ORS;  Service: Obstetrics;  Laterality: N/A;   CESAREAN SECTION N/A 10/11/2014   Procedure: CESAREAN SECTION;  Surgeon: Rosaline LITTIE Cobble, MD;  Location: [facility] ORS;  Service: Obstetrics;  Laterality: N/A;   CESAREAN SECTION N/A 10/24/2018   Procedure: REPEAT CESAREAN SECTION;  Surgeon: Cobble Rosaline, MD;  Location: [facility];  Service: Obstetrics;  Laterality: N/A;  Tracey RNFA   LITHOTRIPSY     PORTACATH PLACEMENT Left 11/25/2021   Procedure: PORT PLACEMENT;  Surgeon: Ebbie Cough, MD;  Location: [facility] OR;  Service: General;  Laterality: Left;   WISDOM TOOTH EXTRACTION      SOCIAL HISTORY: Social History   Socioeconomic History   Marital status: Married    Spouse name: Not on file   Number of children: Not on file   Years of education: Not on file   Highest education level: Not on file  Occupational History   Not on file  Tobacco Use   Smoking status: Never   Smokeless tobacco: Never  Vaping Use   Vaping status: Never Used  Substance and Sexual Activity   Alcohol use: No   Drug use: No   Sexual activity: Yes    Birth control/protection: Other-see comments    Comment: Husband has had a vasectomy  Other Topics Concern   Not on file  Social History Narrative   Not on file   Social Drivers of Health   Tobacco Use: Low Risk (06/20/2024)   Patient History    Smoking Tobacco Use: Never    Smokeless Tobacco Use: Never    Passive Exposure: Not on file  Financial Resource Strain: Not on file  Food Insecurity: No Food Insecurity (04/12/2022)   Received from [facility]    Epic    Within the past 12 months, you worried that your food would run out before you got the money to buy more.: Never true    Within the past 12 months, the food you bought just didn't last and you didn't have money to get more.: Never true  Transportation Needs: Not on file  Physical Activity: Not on file  Stress: Not on file  Social Connections: Not on file  Intimate Partner Violence: Not on file  Depression (PHQ2-9): Low Risk (10/05/2024)   Depression (PHQ2-9)    PHQ-2 Score: 0  Alcohol Screen: Not on file  Housing: Not on file  Utilities: Not on file  Health Literacy: Not on file    FAMILY HISTORY: Family History  Problem Relation Age of Onset   Hypertension Mother    Diabetes Mother    Hypertension Father    Hypertension Maternal Grandmother    Diabetes Maternal Grandmother    Heart disease Maternal Grandfather  Hypertension Maternal Grandfather    Heart disease Paternal Grandfather    Hypertension Paternal Grandfather    Retinoblastoma Cousin        dx 3 mo - left eye    Review of Systems  Constitutional:  Negative for appetite change, chills, fatigue, fever and unexpected weight change.  HENT:   Negative for hearing loss, lump/mass and trouble swallowing.   Eyes:  Negative for eye problems and icterus.  Respiratory:  Negative for chest tightness, cough and shortness of breath.   Cardiovascular:  Negative for chest pain, leg swelling and palpitations.  Gastrointestinal:  Negative for abdominal distention, abdominal pain, constipation, diarrhea, nausea and vomiting.  Endocrine: Negative for hot flashes.  Genitourinary:  Negative for difficulty urinating.   Musculoskeletal:  Negative for arthralgias.  Skin:  Negative for itching and rash.  Neurological:  Negative for dizziness, extremity weakness, headaches and numbness.  Hematological:  Negative for adenopathy. Does not bruise/bleed easily.  Psychiatric/Behavioral:  Negative for depression. The patient is not  nervous/anxious.       PHYSICAL EXAMINATION    Vitals:   12/04/24 1535  BP: 129/66  Pulse: 73  Resp: 17  Temp: 98.2 F (36.8 C)  SpO2: 100%    Physical Exam Constitutional:      General: She is not in acute distress.    Appearance: Normal appearance. She is not toxic-appearing.  HENT:     Head: Normocephalic and atraumatic.     Mouth/Throat:     Mouth: Mucous membranes are moist.     Pharynx: Oropharynx is clear. No oropharyngeal exudate or posterior oropharyngeal erythema.  Eyes:     General: No scleral icterus. Cardiovascular:     Rate and Rhythm: Normal rate and regular rhythm.     Pulses: Normal pulses.     Heart sounds: Normal heart sounds.  Pulmonary:     Effort: Pulmonary effort is normal.     Breath sounds: Normal breath sounds.  Chest:     Comments: Unable to palpate left breast mass or nodule Abdominal:     General: Abdomen is flat. Bowel sounds are normal. There is no distension.     Palpations: Abdomen is soft.     Tenderness: There is no abdominal tenderness.  Musculoskeletal:        General: No swelling.     Cervical back: Neck supple.  Lymphadenopathy:     Cervical: No cervical adenopathy.     Upper Body:     Right upper body: No supraclavicular or axillary adenopathy.     Left upper body: No supraclavicular or axillary adenopathy.  Skin:    General: Skin is warm and dry.     Findings: No rash.  Neurological:     General: No focal deficit present.     Mental Status: She is alert.  Psychiatric:        Mood and Affect: Mood normal.        Behavior: Behavior normal.     LABORATORY DATA:  CBC    Component Value Date/Time   WBC 4.4 09/24/2024 1149   WBC 9.6 10/25/2018 0515   RBC 4.06 09/24/2024 1149   HGB 12.7 09/24/2024 1149   HCT 37.2 09/24/2024 1149   PLT 203 09/24/2024 1149   MCV 91.6 09/24/2024 1149   MCH 31.3 09/24/2024 1149   MCHC 34.1 09/24/2024 1149   RDW 13.2 09/24/2024 1149   LYMPHSABS 0.7 09/24/2024 1149   MONOABS 0.4  09/24/2024 1149   EOSABS 0.0 09/24/2024 1149  BASOSABS 0.0 09/24/2024 1149    CMP     Component Value Date/Time   NA 140 09/05/2023 1102   K 3.7 09/05/2023 1102   CL 111 09/05/2023 1102   CO2 25 09/05/2023 1102   GLUCOSE 116 (H) 09/05/2023 1102   BUN 11 09/05/2023 1102   CREATININE 0.63 09/05/2023 1102   CALCIUM 9.5 09/05/2023 1102   PROT 6.6 09/05/2023 1102   ALBUMIN 4.3 09/05/2023 1102   AST 11 (L) 09/05/2023 1102   ALT 11 09/05/2023 1102   ALKPHOS 38 09/05/2023 1102   BILITOT 0.4 09/05/2023 1102   GFRNONAA >60 09/05/2023 1102   GFRAA >60 10/22/2018 2142     ASSESSMENT and THERAPY PLAN:   Malignant neoplasm of upper-outer quadrant of right breast in female, estrogen receptor negative (HCC) 11/04/2021: Patient has been breast-feeding.  Felt a painful right breast mass and an axillary mass.  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 with a Ki-67 of 40%, 1 abnormal lymph node 5.8 cm biopsy: Grade 3 IDC ER 5%, PR 0%, HER2 negative, Ki-67 40%   CT CAP 11/20/2021: Right breast cancer with axillary lymph nodes, mildly enlarged right retropectoral lymph nodes, right internal mammary lymph nodes Breast MRI 11/17/2021: Known breast cancer 2 cm, right axillary lymph node, non-mass enhancement 7 cm, borderline enlarged right internal mammary lymph nodes   Treatment Plan: based on multidisciplinary tumor board: 1. Neoadjuvant chemotherapy with Taxol  weekly 12 with Keytruda  and carboplatin  every 3 weeks x4 Adriamycin  and Cytoxan , Keytruda  dose dense 4 followed by  followed by Keytruda  maintenance for 1 year completed 08/19/2022 2. 06/18/2022:Right lumpectomy (at [facility]): No residual invasive or in situ carcinoma.  0/5 lymph nodes 3. Followed by adjuvant radiation therapy completed September 2023.  ------------------------------------------------------------------------------------------------------ Breast cancer surveillance: Breast  exam 06/22/2023: Benign Mammogram 06/20/2024: Benign.  Signatera 08/24/2024: Negative Breast MRI 11/28/2024, abnormal area, biopsy recommended and scheduled on 12/06/2024  Patient met with myself and Dr. Odean.  We reviewed the images with her and her husband in detail and discussed next steps.  Will f/u with her based on biopsy results.     All questions were answered. The patient knows to call the clinic with any problems, questions or concerns. We can certainly see the patient much sooner if necessary.  Total encounter time:30 minutes*in face-to-face visit time, chart review, lab review, care coordination, order entry, and documentation of the encounter time.    Sydney Kendall, NP 12/06/2024 4:27 PM Medical Oncology and Hematology [facility] [address] [place], [place] [address] Tel. [phone]    Fax. [phone]  *Total Encounter Time as defined by the Centers for Medicare and Medicaid Services includes, in addition to the face-to-face time of a patient visit (documented in the note above) non-face-to-face time: obtaining and reviewing outside history, ordering and reviewing medications, tests or procedures, care coordination (communications with other health care professionals or caregivers) and documentation in the medical record.

## 2024-12-06 NOTE — Assessment & Plan Note (Signed)
 11/04/2021: Patient has been breast-feeding.  Felt a painful right breast mass and an axillary mass.  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 with a Ki-67 of 40%, 1 abnormal lymph node 5.8 cm biopsy: Grade 3 IDC ER 5%, PR 0%, HER2 negative, Ki-67 40%   CT CAP 11/20/2021: Right breast cancer with axillary lymph nodes, mildly enlarged right retropectoral lymph nodes, right internal mammary lymph nodes Breast MRI 11/17/2021: Known breast cancer 2 cm, right axillary lymph node, non-mass enhancement 7 cm, borderline enlarged right internal mammary lymph nodes   Treatment Plan: based on multidisciplinary tumor board: 1. Neoadjuvant chemotherapy with Taxol  weekly 12 with Keytruda  and carboplatin  every 3 weeks x4 Adriamycin  and Cytoxan , Keytruda  dose dense 4 followed by  followed by Keytruda  maintenance for 1 year completed 08/19/2022 2. 06/18/2022:Right lumpectomy (at [facility]): No residual invasive or in situ carcinoma.  0/5 lymph nodes 3. Followed by adjuvant radiation therapy completed September 2023.  ------------------------------------------------------------------------------------------------------ Breast cancer surveillance: Breast exam 06/22/2023: Benign Mammogram 06/20/2024: Benign.  Signatera 08/24/2024: Negative Breast MRI 11/28/2024, abnormal area, biopsy recommended and scheduled on 12/06/2024  Patient met with myself and Dr. Odean.  We reviewed the images with her and her husband in detail and discussed next steps.  Will f/u with her based on biopsy results.

## 2024-12-07 ENCOUNTER — Encounter: Payer: Self-pay | Admitting: Adult Health

## 2024-12-07 LAB — SURGICAL PATHOLOGY

## 2025-01-12 ENCOUNTER — Ambulatory Visit
Admission: EM | Admit: 2025-01-12 | Discharge: 2025-01-12 | Disposition: A | Discharge status: Home / Self Care | Attending: Physician Assistant | Admitting: Physician Assistant

## 2025-01-12 ENCOUNTER — Encounter: Payer: Self-pay | Admitting: Emergency Medicine

## 2025-01-12 ENCOUNTER — Ambulatory Visit

## 2025-01-12 DIAGNOSIS — R10A1 Flank pain, right side: Secondary | ICD-10-CM

## 2025-01-12 DIAGNOSIS — K5900 Constipation, unspecified: Secondary | ICD-10-CM | POA: Diagnosis not present

## 2025-01-12 DIAGNOSIS — N200 Calculus of kidney: Secondary | ICD-10-CM

## 2025-01-12 HISTORY — DX: Calculus of kidney: N20.0

## 2025-01-12 LAB — POCT URINE DIPSTICK
Bilirubin, UA: NEGATIVE
Blood, UA: NEGATIVE
Glucose, UA: NEGATIVE mg/dL
Ketones, POC UA: NEGATIVE mg/dL
Leukocytes, UA: NEGATIVE
Nitrite, UA: NEGATIVE
Protein Ur, POC: NEGATIVE mg/dL
Spec Grav, UA: 1.01
Urobilinogen, UA: 0.2 U/dL
pH, UA: 7

## 2025-01-12 MED ORDER — TAMSULOSIN HCL 0.4 MG PO CAPS: 0.4000 mg | ORAL_CAPSULE | Freq: Every day | ORAL | 0 refills | Status: AC

## 2025-01-12 NOTE — ED Provider Notes (Signed)
 " [facility]    CSN: [id number] Arrival date & time: 01/12/25  1312      History   Chief Complaint Chief Complaint  Patient presents with   Flank Pain    right    HPI Sydney Rios is a 44 y.o. female with history of nephrolithiasis, breast cancer, and Hashimoto's thyroiditis.  Patient presents today for right lower back/flank pain x 2-3 days.  Denies fall/trauma or injury.  Reports a little bit of bladder pressure and urinary frequency but reports increasing her fluid intake.  Denies painful urination, hematuria, fever, chills, vomiting.  Concern for possible urinary tract infection versus kidney stone.  Current pain is 3 out of 10.  Has taken ibuprofen  for pain.  No other concerns.  HPI  Past Medical History:  Diagnosis Date   Anxiety    Cancer (HCC)    Right Breast CA   Complication of anesthesia    runs heart rate in the 30-40s in PACU   History of kidney stones    Hx gestational diabetes    Hx of Hashimoto thyroiditis    Nephrolithiasis    PONV (postoperative nausea and vomiting)     Patient Active Problem List   Diagnosis Date Noted   Iron  deficiency anemia 06/23/2023   Incisional hernia, without obstruction or gangrene 11/01/2022   Genetic testing 12/01/2021   Port-A-Cath in place 11/30/2021   Malignant neoplasm of upper-outer quadrant of right breast in female, estrogen receptor negative (HCC) 11/11/2021   Abnormal maternal glucose tolerance, antepartum 09/15/2018   S/P cesarean section 10/11/2014   TACHYCARDIA 05/01/2010    Past Surgical History:  Procedure Laterality Date   BREAST BIOPSY Left 12/06/2024   MM LT BREAST BX W LOC DEV 1ST LESION IMAGE BX SPEC STEREO GUIDE 12/06/2024 [facility]   CESAREAN SECTION     CESAREAN SECTION  01/23/2013   Procedure: CESAREAN SECTION;  Surgeon: Alm JAYSON Cook, MD;  Location: [facility];  Service: Obstetrics;  Laterality: N/A;   CESAREAN SECTION N/A 10/11/2014   Procedure: CESAREAN SECTION;  Surgeon:  Rosaline LITTIE Cobble, MD;  Location: [facility];  Service: Obstetrics;  Laterality: N/A;   CESAREAN SECTION N/A 10/24/2018   Procedure: REPEAT CESAREAN SECTION;  Surgeon: Cobble Rosaline, MD;  Location: [facility];  Service: Obstetrics;  Laterality: N/A;  Tracey RNFA   LITHOTRIPSY     PORTACATH PLACEMENT Left 11/25/2021   Procedure: PORT PLACEMENT;  Surgeon: Ebbie Cough, MD;  Location: [facility] OR;  Service: General;  Laterality: Left;   WISDOM TOOTH EXTRACTION      OB History     Gravida  4   Para  4   Term  4   Preterm      AB      Living  4      SAB      IAB      Ectopic      Multiple  0   Live Births  4            Home Medications    Prior to Admission medications  Medication Sig Start Date End Date Taking? Authorizing Provider  tamsulosin  (FLOMAX ) 0.4 MG CAPS capsule Take 1 capsule (0.4 mg total) by mouth daily. 01/12/25  Yes Arvis Jolan NOVAK, PA-C  ALPRAZolam (XANAX) 0.25 MG tablet Take 0.125 mg by mouth at bedtime as needed. 07/06/24   [provider]  Ascorbic Acid (VITAMIN C) 1000 MG tablet Take 2,000 mg by mouth every 30 (  thirty) days.    [provider]  cholecalciferol (VITAMIN D3) 25 MCG (1000 UNIT) tablet Take 10,000 Units by mouth 3 (three) times a week.    [provider]  levothyroxine  (SYNTHROID ) 175 MCG tablet Take 175 mcg by mouth daily before breakfast.    [provider]  liothyronine (CYTOMEL) 5 MCG tablet Take 10 mcg by mouth daily. 09/12/24   [provider]  Naltrexone HCl, Pain, (NALTREX) 1.5 MG CAPS 4 mg. 10/28/23   [provider]  ondansetron  (ZOFRAN ) 4 MG tablet Take 4 mg by mouth every 8 (eight) hours as needed. 07/27/24   [provider]  tretinoin (RETIN-A) 0.025 % cream SMARTSIG:sparingly Topical Every Night 03/14/22   [provider]  valACYclovir  (VALTREX ) 1000 MG tablet Take 1 tablet (1,000 mg total) by mouth 3 (three) times daily. 06/01/22   Crawford Morna Pickle, NP  prochlorperazine  (COMPAZINE ) 10 MG tablet Take 1 tablet (10 mg total) by mouth every 6 (six) hours as needed (Nausea or vomiting). 11/11/21 05/18/22  [name], MD    Family History Family History  Problem Relation Age of Onset   Hypertension Mother    Diabetes Mother    Hypertension Father    Hypertension Maternal Grandmother    Diabetes Maternal Grandmother    Heart disease Maternal Grandfather    Hypertension Maternal Grandfather    Heart disease Paternal Grandfather    Hypertension Paternal Grandfather    Retinoblastoma Cousin        dx 3 mo - left eye    Social History Social History[1]   Allergies   Patient has no known allergies.   Review of Systems Review of Systems  Constitutional:  Negative for chills, fatigue and fever.  Gastrointestinal:  Negative for abdominal pain, diarrhea, nausea and vomiting.  Genitourinary:  Positive for flank pain and frequency. Negative for decreased urine volume, dysuria, hematuria, pelvic pain, urgency, vaginal bleeding, vaginal discharge and vaginal pain.  Musculoskeletal:  Positive for back pain.  Skin:  Negative for rash.     Physical Exam Triage Vital Signs ED Triage Vitals  Encounter Vitals Group     BP      Girls Systolic BP Percentile      Girls Diastolic BP Percentile      Boys Systolic BP Percentile      Boys Diastolic BP Percentile      Pulse      Resp      Temp      Temp src      SpO2      Weight      Height      Head Circumference      Peak Flow      Pain Score      Pain Loc      Pain Education      Exclude from Growth Chart    No data found.  Updated Vital Signs BP 125/63 (BP Location: Right Arm)   Pulse 100   Temp 97.6 F (36.4 C) (Oral)   Resp 14   Ht 5' 7 (1.702 m)   Wt 171 lb 4.8 oz (77.7 kg)   LMP 12/29/2024 (Approximate)   SpO2 99%   BMI 26.83 kg/m      Physical Exam Vitals and nursing note reviewed.  Constitutional:      General: She is not in acute distress.     Appearance: Normal appearance. She is not ill-appearing or toxic-appearing.  HENT:     Head:  Normocephalic and atraumatic.  Eyes:     General: No scleral icterus.       Right eye: No discharge.        Left eye: No discharge.     Conjunctiva/sclera: Conjunctivae normal.  Cardiovascular:     Rate and Rhythm: Normal rate and regular rhythm.     Heart sounds: Normal heart sounds.  Pulmonary:     Effort: Pulmonary effort is normal. No respiratory distress.     Breath sounds: Normal breath sounds.  Abdominal:     Palpations: Abdomen is soft.     Tenderness: There is no abdominal tenderness. There is right CVA tenderness. There is no left CVA tenderness.  Musculoskeletal:     Cervical back: Neck supple.     Comments: Full ROM of back. TTP right lumbar region  Skin:    General: Skin is dry.  Neurological:     General: No focal deficit present.     Mental Status: She is alert. Mental status is at baseline.     Motor: No weakness.     Gait: Gait normal.  Psychiatric:        Mood and Affect: Mood normal.        Behavior: Behavior normal.      UC Treatments / Results  Labs (all labs ordered are listed, but only abnormal results are displayed) Labs Reviewed  POCT URINE DIPSTICK - Normal    EKG   Radiology DG Abdomen 1 View Result Date: 01/12/2025 CLINICAL DATA:  Right flank pain for 2 days. History of kidney stones. EXAM: ABDOMEN - 1 VIEW COMPARISON:  11/05/2019, 10/22/2022. FINDINGS: The stomach is distended with gas. Bowel-gas pattern is otherwise normal. A cluster of calcifications is noted in the right upper quadrant, the largest measuring 4 mm. No renal calculus is seen on the left. No ureteral calculus is seen. There is a moderate amount of retained stool in the rectum. IMPRESSION: Right renal calculi. Electronically Signed   By: Leita Birmingham M.D.   On: 01/12/2025 14:50    Procedures Procedures (including critical care time)  Medications Ordered in UC Medications - No  data to display  Initial Impression / Assessment and Plan / UC Course  I have reviewed the triage vital signs and the nursing notes.  Pertinent labs & imaging results that were available during my care of the patient were reviewed by me and considered in my medical decision making (see chart for details).   44 year old female presents for right lower back/flank pain, bladder pressure and urinary frequency for the past couple days.  No injury.  History of kidney stones and previous need for lithotripsy.  Taking ibuprofen  and it has helped pain.  Urinalysis is completely normal.  Will obtain KUB.  KUB shows cluster of calcifications in the right upper quadrant consistent with nephrolithiasis, distended stomach with gas and moderate stool in rectum.  I reviewed all results with patient.  Unclear if patient's right back/flank pain is related to kidney stones or musculoskeletal.  Placing her on tamsulosin  and encouraged her to increase fluids and rest and make a follow-up with her urologist for CT scan especially if symptoms or not improving.  Advised to continue Tylenol  and Motrin , heating pad.  We also discussed trying Gas-X and MiraLAX for constipation.  She denies any abdominal pain, swelling, belching, burping or constipation.  Reviewed return precautions.   Final Clinical Impressions(s) / UC Diagnoses   Final diagnoses:  Right flank pain  Right nephrolithiasis  Constipation,  unspecified constipation type     Discharge Instructions      - You have a cluster of multiple kidney stones on the right side. - I sent Flomax  to the pharmacy to help pass the stone if it is trying to pass.  Important that you increase your fluid intake and rest. - Take ibuprofen  and Tylenol  as needed for pain relief and use warm compresses. - You are constipated and you have a lot of gas in your stomach.  Consider Gas-X and MiraLAX. - Make a follow-up appointment with your urologist.  You may need a CT scan  to assess these kidney stones further.     ED Prescriptions     Medication Sig Dispense Auth. Provider   tamsulosin  (FLOMAX ) 0.4 MG CAPS capsule Take 1 capsule (0.4 mg total) by mouth daily. 30 capsule Arvis Jolan NOVAK, PA-C      PDMP not reviewed this encounter.     [1]  Social History Tobacco Use   Smoking status: Never   Smokeless tobacco: Never  Vaping Use   Vaping status: Never Used  Substance Use Topics   Alcohol use: No   Drug use: No     Arvis Jolan NOVAK, PA-C 01/12/25 1506  "

## 2025-01-12 NOTE — ED Triage Notes (Signed)
 Patient c/o right flank pain that started on Wed.  Patient denies any dysuria and frequency.  Patient denies hematuria.  Patient denies vaginal discharge.  Patient denies fevers.  Patient denies N/V.

## 2025-01-12 NOTE — Discharge Instructions (Addendum)
-   You have a cluster of multiple kidney stones on the right side. - I sent Flomax  to the pharmacy to help pass the stone if it is trying to pass.  Important that you increase your fluid intake and rest. - Take ibuprofen  and Tylenol  as needed for pain relief and use warm compresses. - You are constipated and you have a lot of gas in your stomach.  Consider Gas-X and MiraLAX. - Make a follow-up appointment with your urologist.  You may need a CT scan to assess these kidney stones further.

## 2025-10-02 ENCOUNTER — Ambulatory Visit: Admitting: Hematology and Oncology
